# Patient Record
Sex: Male | Born: 1935 | Race: White | Hispanic: No | Smoking: Former smoker
Health system: Southern US, Community
[De-identification: ages and names within clinical notes are randomized; demographics above are authoritative.]

## PROBLEM LIST (undated history)

## (undated) DIAGNOSIS — I10 Essential (primary) hypertension: Secondary | ICD-10-CM

## (undated) DIAGNOSIS — I639 Cerebral infarction, unspecified: Secondary | ICD-10-CM

## (undated) DIAGNOSIS — E785 Hyperlipidemia, unspecified: Secondary | ICD-10-CM

## (undated) DIAGNOSIS — L409 Psoriasis, unspecified: Secondary | ICD-10-CM

## (undated) DIAGNOSIS — E039 Hypothyroidism, unspecified: Secondary | ICD-10-CM

## (undated) DIAGNOSIS — I8390 Asymptomatic varicose veins of unspecified lower extremity: Secondary | ICD-10-CM

## (undated) DIAGNOSIS — N529 Male erectile dysfunction, unspecified: Secondary | ICD-10-CM

## (undated) DIAGNOSIS — E559 Vitamin D deficiency, unspecified: Secondary | ICD-10-CM

## (undated) HISTORY — DX: Hyperlipidemia, unspecified: E78.5

## (undated) HISTORY — DX: Hypothyroidism, unspecified: E03.9

## (undated) HISTORY — DX: Asymptomatic varicose veins of unspecified lower extremity: I83.90

## (undated) HISTORY — PX: OTHER SURGICAL HISTORY: SHX169

## (undated) HISTORY — DX: Psoriasis, unspecified: L40.9

## (undated) HISTORY — DX: Vitamin D deficiency, unspecified: E55.9

## (undated) HISTORY — DX: Essential (primary) hypertension: I10

## (undated) HISTORY — DX: Male erectile dysfunction, unspecified: N52.9

## (undated) HISTORY — PX: JOINT REPLACEMENT: SHX530

---

## 1994-08-07 HISTORY — PX: OTHER SURGICAL HISTORY: SHX169

## 1999-12-22 ENCOUNTER — Encounter: Payer: Self-pay | Admitting: Specialist

## 1999-12-22 ENCOUNTER — Ambulatory Visit (HOSPITAL_COMMUNITY): Admission: RE | Admit: 1999-12-22 | Discharge: 1999-12-22 | Payer: Self-pay | Admitting: Specialist

## 2000-01-05 ENCOUNTER — Encounter: Payer: Self-pay | Admitting: Specialist

## 2000-01-05 ENCOUNTER — Ambulatory Visit (HOSPITAL_COMMUNITY): Admission: RE | Admit: 2000-01-05 | Discharge: 2000-01-05 | Payer: Self-pay | Admitting: Specialist

## 2001-03-14 ENCOUNTER — Encounter: Payer: Self-pay | Admitting: Emergency Medicine

## 2001-03-14 ENCOUNTER — Inpatient Hospital Stay (HOSPITAL_COMMUNITY): Admission: AD | Admit: 2001-03-14 | Discharge: 2001-03-15 | Payer: Self-pay | Admitting: Cardiology

## 2002-03-11 ENCOUNTER — Encounter: Admission: RE | Admit: 2002-03-11 | Discharge: 2002-03-11 | Payer: Self-pay | Admitting: *Deleted

## 2003-03-16 ENCOUNTER — Ambulatory Visit (HOSPITAL_COMMUNITY): Admission: RE | Admit: 2003-03-16 | Discharge: 2003-03-16 | Payer: Self-pay | Admitting: *Deleted

## 2003-03-16 ENCOUNTER — Encounter (INDEPENDENT_AMBULATORY_CARE_PROVIDER_SITE_OTHER): Payer: Self-pay

## 2004-06-14 ENCOUNTER — Ambulatory Visit: Payer: Self-pay | Admitting: Family Medicine

## 2004-07-21 ENCOUNTER — Ambulatory Visit: Payer: Self-pay | Admitting: Family Medicine

## 2004-07-27 ENCOUNTER — Ambulatory Visit: Payer: Self-pay | Admitting: Family Medicine

## 2004-09-07 ENCOUNTER — Emergency Department (HOSPITAL_COMMUNITY): Admission: EM | Admit: 2004-09-07 | Discharge: 2004-09-07 | Payer: Self-pay | Admitting: Emergency Medicine

## 2004-10-25 ENCOUNTER — Ambulatory Visit: Payer: Self-pay | Admitting: Family Medicine

## 2004-11-01 ENCOUNTER — Ambulatory Visit: Payer: Self-pay | Admitting: Family Medicine

## 2005-02-12 ENCOUNTER — Emergency Department (HOSPITAL_COMMUNITY): Admission: EM | Admit: 2005-02-12 | Discharge: 2005-02-12 | Payer: Self-pay | Admitting: Emergency Medicine

## 2005-03-16 ENCOUNTER — Ambulatory Visit: Payer: Self-pay | Admitting: Family Medicine

## 2005-03-21 ENCOUNTER — Ambulatory Visit: Payer: Self-pay | Admitting: Family Medicine

## 2005-06-23 ENCOUNTER — Ambulatory Visit: Payer: Self-pay | Admitting: Cardiology

## 2005-06-23 IMAGING — CR DG CHEST 1V PORT
1 series · 1 of 1 positions shown · non-contrast
Comparison: [DATE].

CLINICAL DATA: Arrhythmia.  Supraventricular tachycardia.  Chest pain.   Hypertension.
 PORTABLE CHEST - 1 VIEW:

[view not recorded]
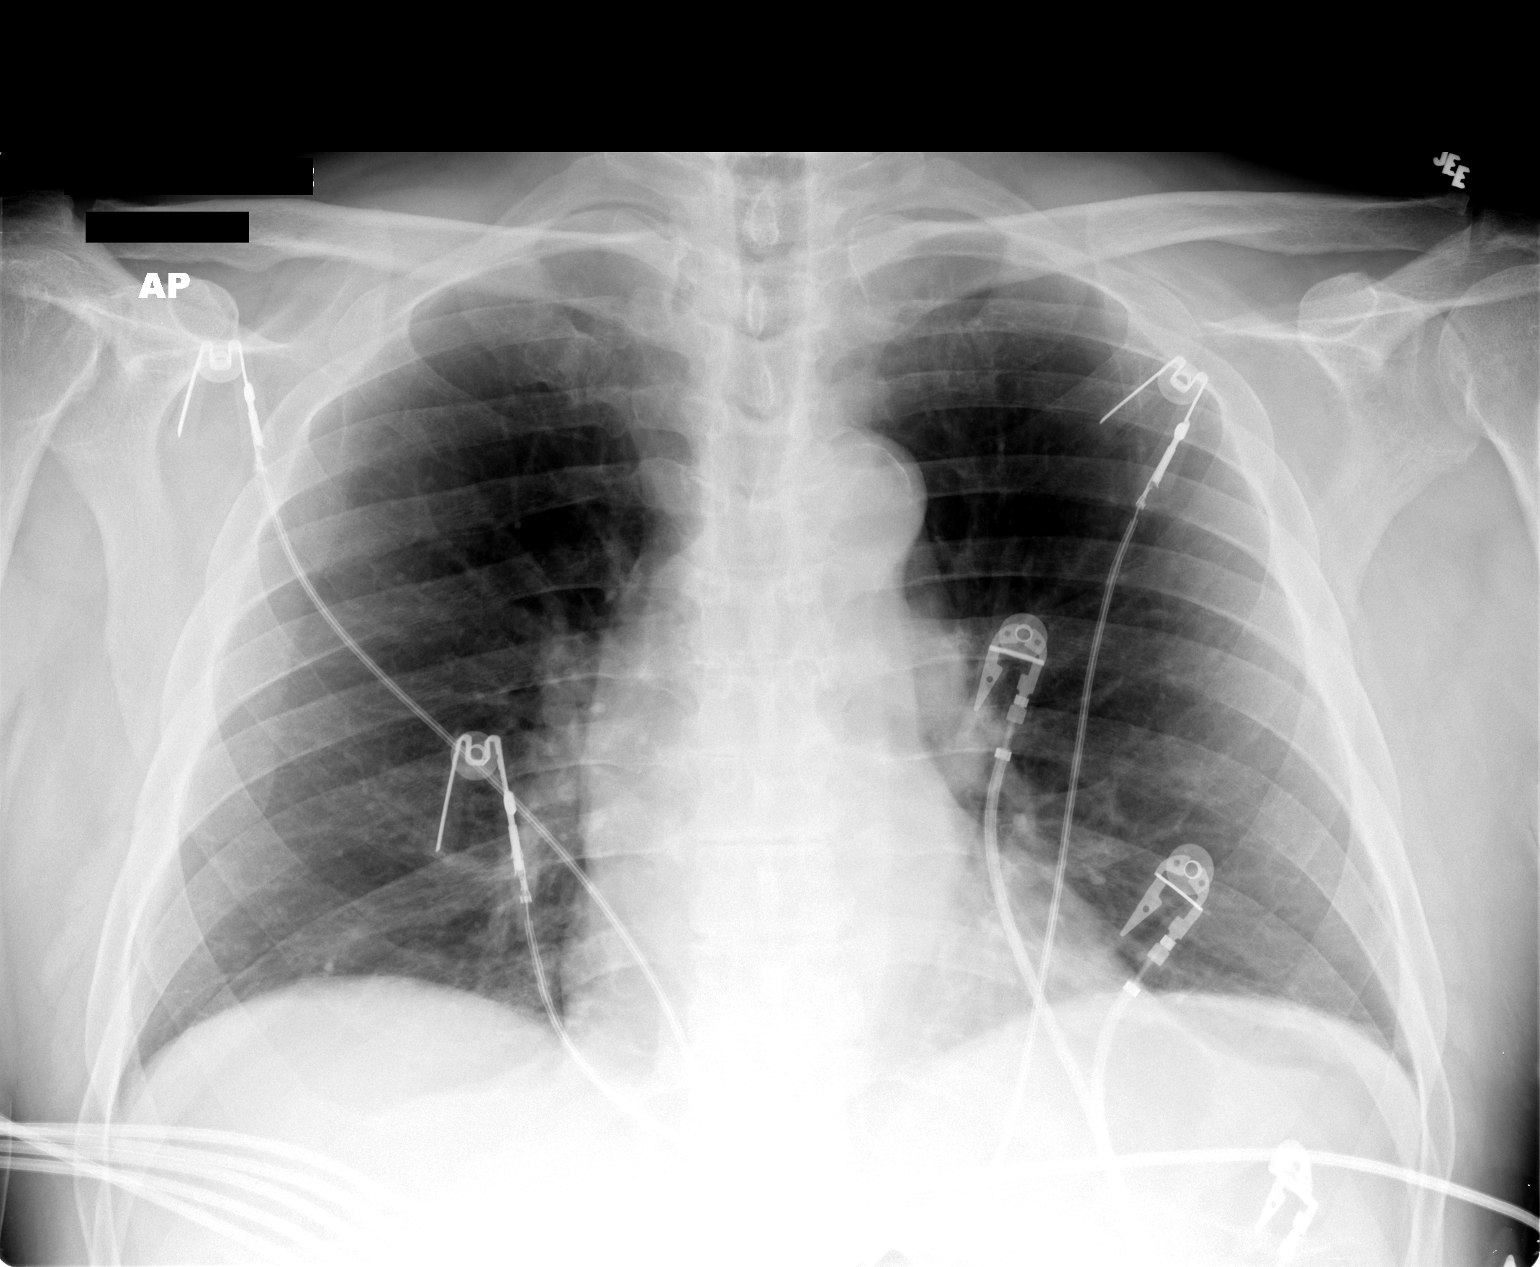

[1 of 1 positions shown; findings below may reference images not displayed]

FINDINGS: Mild streaky opacity is noted in the left lung base which was not seen on the previous study.  This may be due to mild atelectasis versus infiltrate.  The right lung remains clear.  There is no evidence of pleural effusion.  The heart size is normal.
IMPRESSION: Low lung volumes.  Mild atelectasis versus infiltrate at the left lung base.

## 2005-06-24 ENCOUNTER — Inpatient Hospital Stay (HOSPITAL_COMMUNITY): Admission: EM | Admit: 2005-06-24 | Discharge: 2005-06-24 | Payer: Self-pay | Admitting: Emergency Medicine

## 2005-07-11 ENCOUNTER — Ambulatory Visit: Payer: Self-pay | Admitting: Internal Medicine

## 2005-07-18 ENCOUNTER — Ambulatory Visit (HOSPITAL_COMMUNITY): Admission: AD | Admit: 2005-07-18 | Discharge: 2005-07-19 | Payer: Self-pay | Admitting: Internal Medicine

## 2005-08-11 ENCOUNTER — Ambulatory Visit: Payer: Self-pay | Admitting: Cardiology

## 2005-11-20 ENCOUNTER — Encounter: Admission: RE | Admit: 2005-11-20 | Discharge: 2005-11-20 | Payer: Self-pay | Admitting: Orthopedic Surgery

## 2005-12-11 ENCOUNTER — Ambulatory Visit: Payer: Self-pay | Admitting: Internal Medicine

## 2006-02-21 ENCOUNTER — Ambulatory Visit: Payer: Self-pay | Admitting: Family Medicine

## 2006-03-28 ENCOUNTER — Ambulatory Visit: Payer: Self-pay | Admitting: Family Medicine

## 2006-06-20 ENCOUNTER — Ambulatory Visit: Payer: Self-pay | Admitting: Family Medicine

## 2006-07-13 ENCOUNTER — Ambulatory Visit: Payer: Self-pay | Admitting: Family Medicine

## 2006-07-13 LAB — CONVERTED CEMR LAB
Chol/HDL Ratio, serum: 5.8
Cholesterol: 177 mg/dL (ref 0–200)
HDL: 30.6 mg/dL — ABNORMAL LOW (ref >39.0)
LDL DIRECT: 76.9 mg/dL
Triglyceride fasting, serum: 317 mg/dL (ref 0–149)
VLDL: 63 mg/dL — ABNORMAL HIGH (ref 0–40)

## 2007-03-12 DIAGNOSIS — E785 Hyperlipidemia, unspecified: Secondary | ICD-10-CM

## 2007-03-12 DIAGNOSIS — I10 Essential (primary) hypertension: Secondary | ICD-10-CM

## 2007-05-02 ENCOUNTER — Ambulatory Visit: Payer: Self-pay | Admitting: Family Medicine

## 2007-05-02 DIAGNOSIS — E039 Hypothyroidism, unspecified: Secondary | ICD-10-CM | POA: Insufficient documentation

## 2007-05-02 DIAGNOSIS — L408 Other psoriasis: Secondary | ICD-10-CM | POA: Insufficient documentation

## 2007-05-08 LAB — CONVERTED CEMR LAB
ALT: 18 units/L (ref 0–53)
AST: 21 units/L (ref 0–37)
Albumin: 3.9 g/dL (ref 3.5–5.2)
Alkaline Phosphatase: 64 units/L (ref 39–117)
BUN: 25 mg/dL — ABNORMAL HIGH (ref 6–23)
Basophils Absolute: 0 10*3/uL (ref 0.0–0.1)
Basophils Relative: 0.3 % (ref 0.0–1.0)
Bilirubin, Direct: 0.3 mg/dL (ref 0.0–0.3)
CO2: 30 meq/L (ref 19–32)
Calcium: 9.4 mg/dL (ref 8.4–10.5)
Chloride: 104 meq/L (ref 96–112)
Cholesterol: 161 mg/dL (ref 0–200)
Creatinine, Ser: 1.5 mg/dL (ref 0.4–1.5)
Direct LDL: 75.4 mg/dL
Eosinophils Absolute: 0.8 10*3/uL — ABNORMAL HIGH (ref 0.0–0.6)
Eosinophils Relative: 11 % — ABNORMAL HIGH (ref 0.0–5.0)
GFR calc Af Amer: 59 mL/min
GFR calc non Af Amer: 48 mL/min
Glucose, Bld: 88 mg/dL (ref 70–99)
HCT: 42.9 % (ref 39.0–52.0)
HDL: 27.8 mg/dL — ABNORMAL LOW (ref >39.0)
Hemoglobin: 15.1 g/dL (ref 13.0–17.0)
Lymphocytes Relative: 27.7 % (ref 12.0–46.0)
MCHC: 35.1 g/dL (ref 30.0–36.0)
MCV: 91.8 fL (ref 78.0–100.0)
Monocytes Absolute: 0.5 10*3/uL (ref 0.2–0.7)
Monocytes Relative: 6.7 % (ref 3.0–11.0)
Neutro Abs: 3.7 10*3/uL (ref 1.4–7.7)
Neutrophils Relative %: 54.3 % (ref 43.0–77.0)
PSA: 4.07 ng/mL — ABNORMAL HIGH (ref 0.10–4.00)
Platelets: 195 10*3/uL (ref 150–400)
Potassium: 4.2 meq/L (ref 3.5–5.1)
RBC: 4.67 M/uL (ref 4.22–5.81)
RDW: 12 % (ref 11.5–14.6)
Sodium: 139 meq/L (ref 135–145)
TSH: 1.18 microintl units/mL (ref 0.35–5.50)
Total Bilirubin: 1.8 mg/dL — ABNORMAL HIGH (ref 0.3–1.2)
Total CHOL/HDL Ratio: 5.8
Total Protein: 6.7 g/dL (ref 6.0–8.3)
Triglycerides: 234 mg/dL (ref 0–149)
VLDL: 47 mg/dL — ABNORMAL HIGH (ref 0–40)
WBC: 6.9 10*3/uL (ref 4.5–10.5)

## 2007-05-15 ENCOUNTER — Ambulatory Visit: Payer: Self-pay | Admitting: Family Medicine

## 2007-06-12 ENCOUNTER — Ambulatory Visit: Payer: Self-pay | Admitting: Family Medicine

## 2007-08-21 ENCOUNTER — Ambulatory Visit: Payer: Self-pay | Admitting: Family Medicine

## 2007-08-22 ENCOUNTER — Telehealth: Payer: Self-pay | Admitting: Family Medicine

## 2007-08-23 ENCOUNTER — Ambulatory Visit: Payer: Self-pay | Admitting: Family Medicine

## 2007-08-23 LAB — CONVERTED CEMR LAB: BUN: 27 mg/dL — ABNORMAL HIGH (ref 6–23)

## 2007-08-30 ENCOUNTER — Ambulatory Visit: Payer: Self-pay | Admitting: Internal Medicine

## 2007-08-30 IMAGING — CT CT HEAD W/O CM
1 series · 16 of 30 positions shown, 20 images · non-contrast
Comparison: none

CLINICAL DATA: Amnesia two weeks ago.
 HEAD CT WITHOUT CONTRAST ? [DATE]:
TECHNIQUE: Contiguous axial CT images were obtained from the base of the skull through the vertex according to standard protocol without contrast.

[Series 2: head_seq 4.5 h37s st · axial · 0.46mm/px · z∈[-132,-6]mm · 16 of 32 slices shown, 20 images]
[im 2/32  brain]
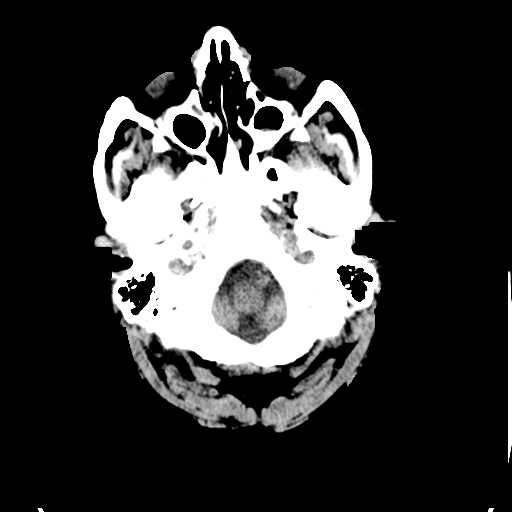
[im 2/32  bone]
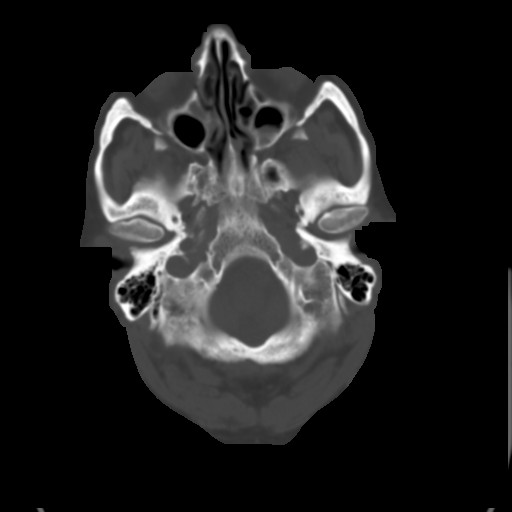
[im 4/32  brain]
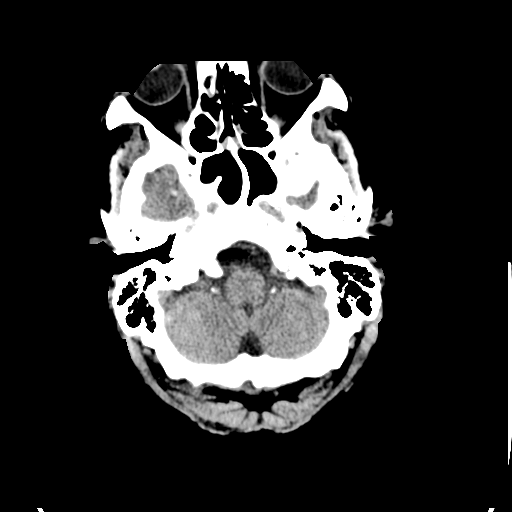
[im 6/32  brain]
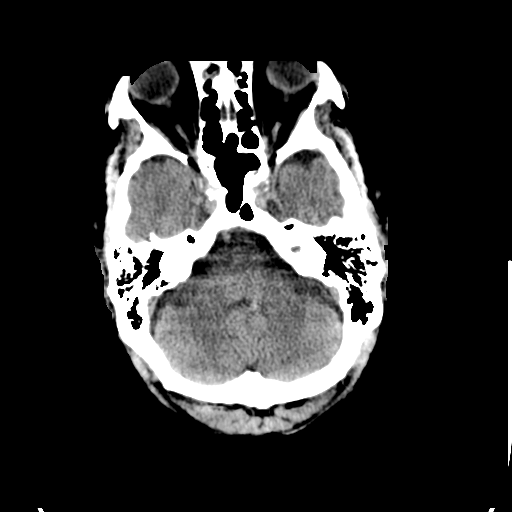
[im 8/32  brain]
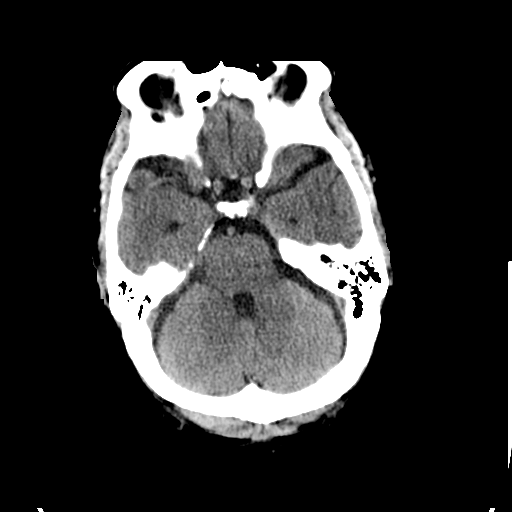
[im 9/32  brain]
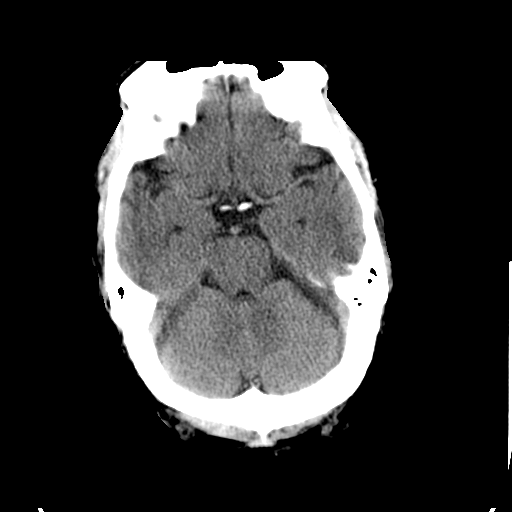
[im 9/32  bone]
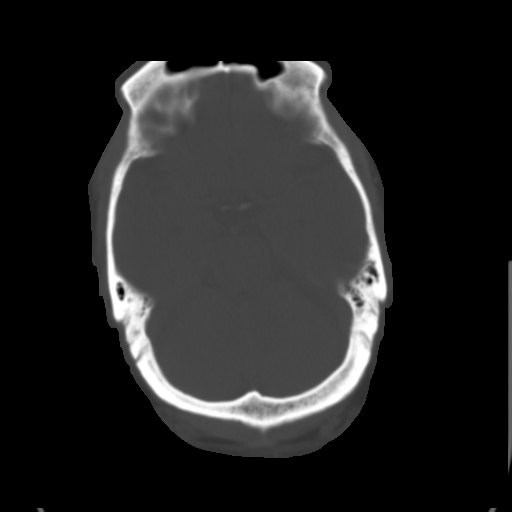
[im 11/32  brain]
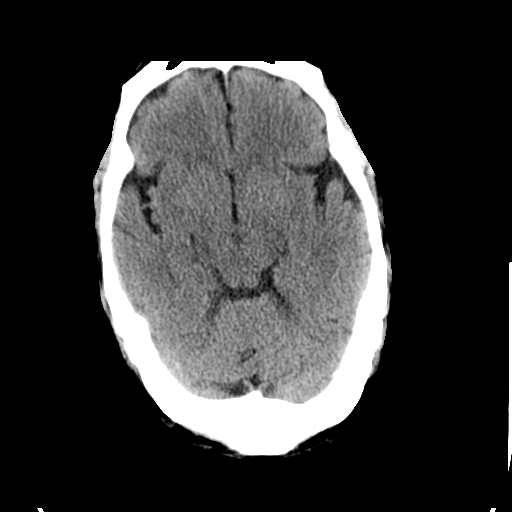
[im 13/32  brain]
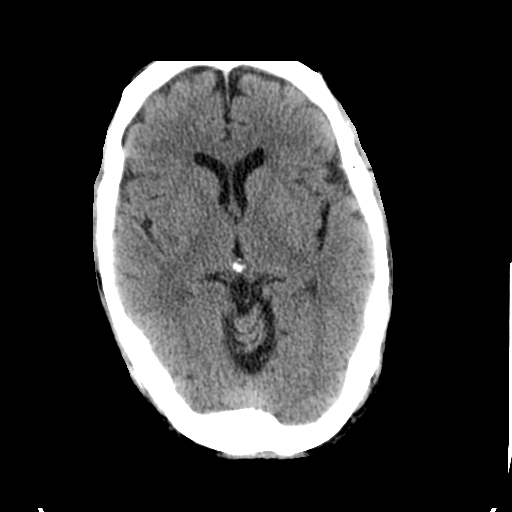
[im 15/32  brain]
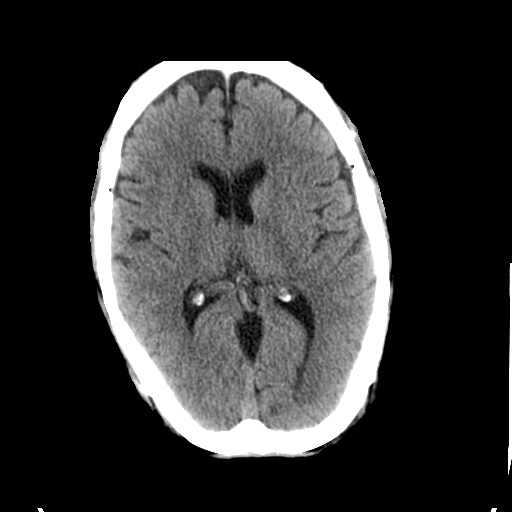
[im 17/32  brain]
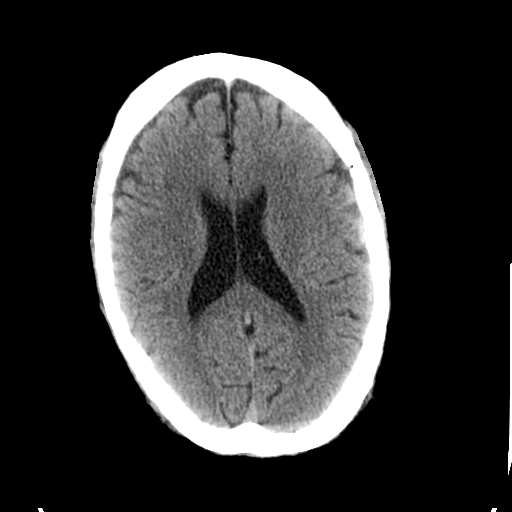
[im 17/32  bone]
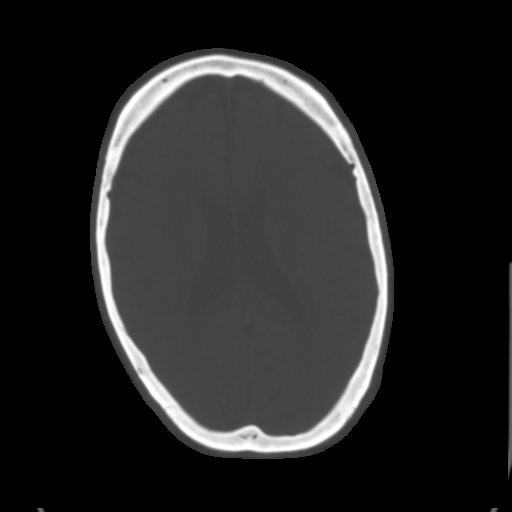
[im 19/32  brain]
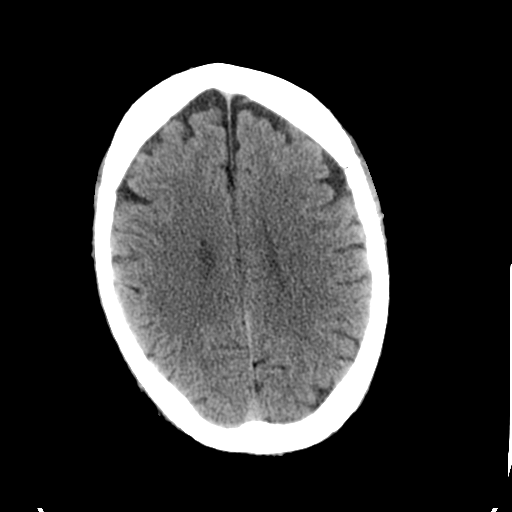
[im 21/32  brain]
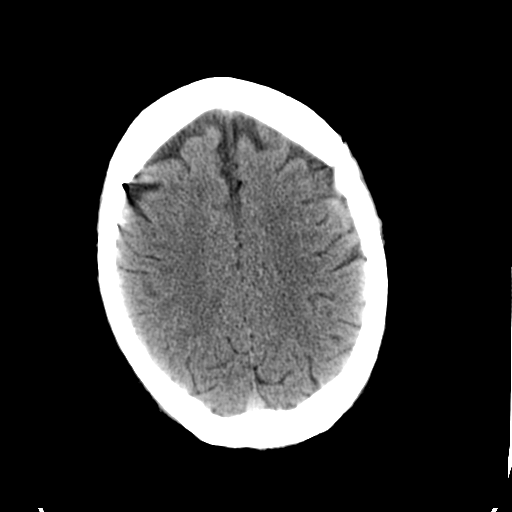
[im 23/32  brain]
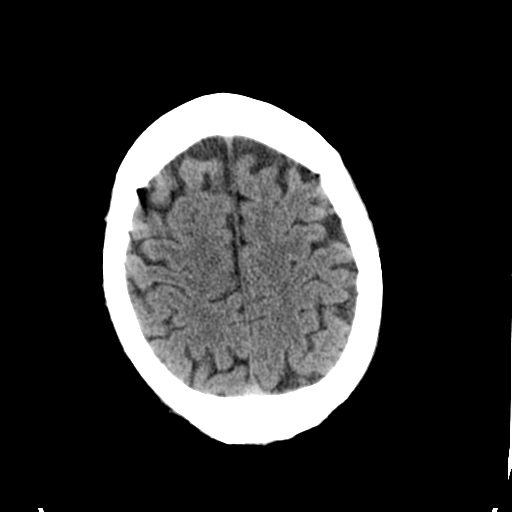
[im 24/32  brain]
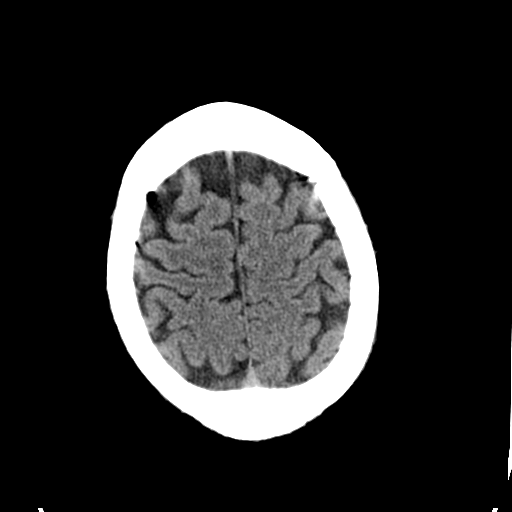
[im 24/32  bone]
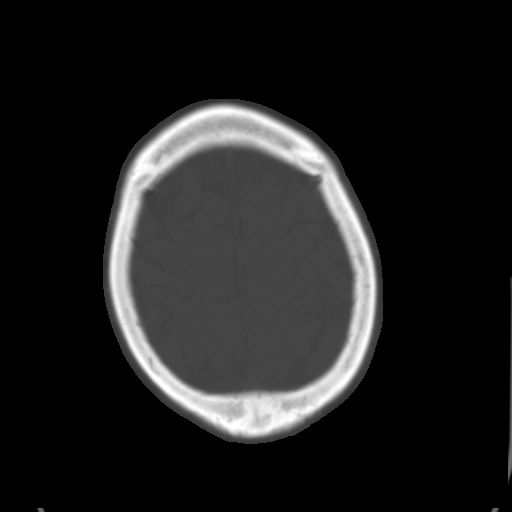
[im 26/32  brain]
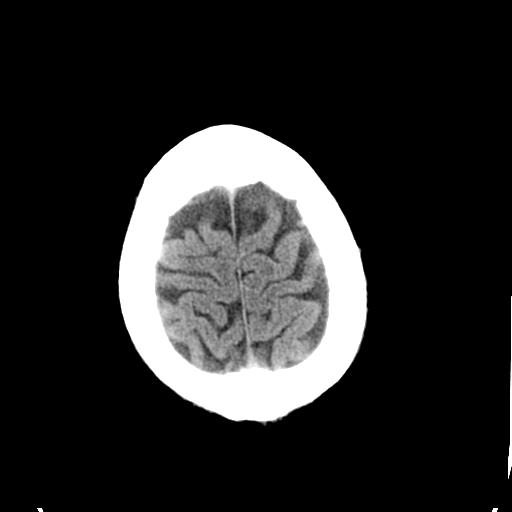
[im 28/32  brain]
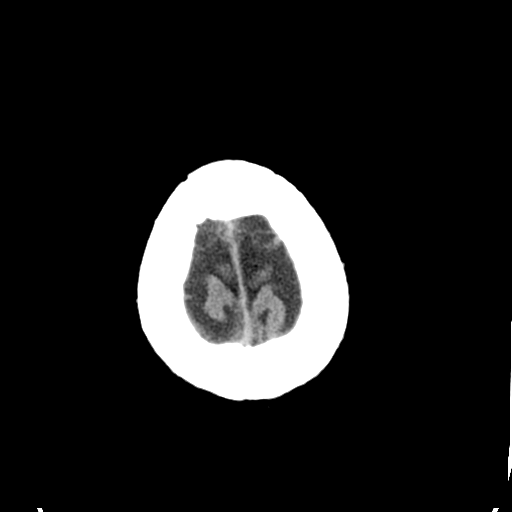
[im 30/32  brain]
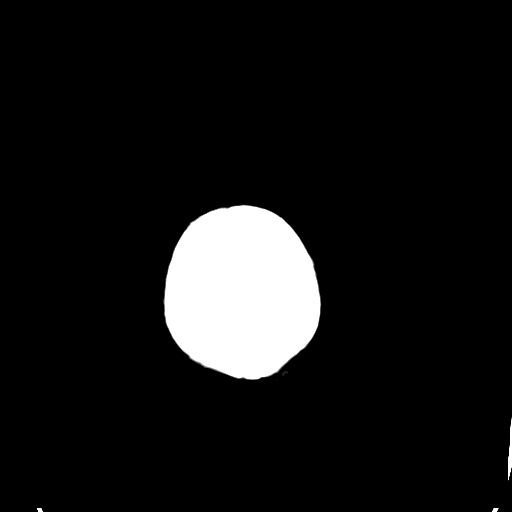

[16 of 30 positions shown; findings below may reference images not displayed]

FINDINGS: The ventricular system is normal in size and configuration for age and the septum is in a normal midline position.  The fourth ventricle and basilar cisterns appear normal.  No blood, edema, or mass effect is seen.  On bone window images, there is evidence of left maxillary and ethmoid sinus disease with some mucosal thickening of the sphenoid sinus as well.  No bony abnormality is seen.
IMPRESSION: 1.  No acute intracranial abnormality. 
 2.  Left maxillary and ethmoid sinus disease.

## 2008-02-03 ENCOUNTER — Encounter: Payer: Self-pay | Admitting: Family Medicine

## 2008-08-13 ENCOUNTER — Ambulatory Visit: Payer: Self-pay | Admitting: Family Medicine

## 2008-08-13 DIAGNOSIS — T50995A Adverse effect of other drugs, medicaments and biological substances, initial encounter: Secondary | ICD-10-CM | POA: Insufficient documentation

## 2008-08-13 DIAGNOSIS — E559 Vitamin D deficiency, unspecified: Secondary | ICD-10-CM

## 2008-08-13 DIAGNOSIS — M129 Arthropathy, unspecified: Secondary | ICD-10-CM | POA: Insufficient documentation

## 2008-08-13 DIAGNOSIS — I8390 Asymptomatic varicose veins of unspecified lower extremity: Secondary | ICD-10-CM

## 2008-08-20 LAB — CONVERTED CEMR LAB
ALT: 16 units/L (ref 0–53)
AST: 22 units/L (ref 0–37)
Albumin: 4.1 g/dL (ref 3.5–5.2)
BUN: 28 mg/dL — ABNORMAL HIGH (ref 6–23)
Basophils Relative: 0.4 % (ref 0.0–3.0)
Chloride: 100 meq/L (ref 96–112)
Creatinine, Ser: 1.4 mg/dL (ref 0.4–1.5)
Direct LDL: 118.7 mg/dL
Eosinophils Absolute: 0.6 10*3/uL (ref 0.0–0.7)
Eosinophils Relative: 7.8 % — ABNORMAL HIGH (ref 0.0–5.0)
GFR calc non Af Amer: 52 mL/min
HCT: 46.6 % (ref 39.0–52.0)
MCV: 93.1 fL (ref 78.0–100.0)
Monocytes Absolute: 0.6 10*3/uL (ref 0.1–1.0)
Neutrophils Relative %: 58.7 % (ref 43.0–77.0)
RBC: 5 M/uL (ref 4.22–5.81)
Total Protein: 7.9 g/dL (ref 6.0–8.3)
Vit D, 1,25-Dihydroxy: 25 — ABNORMAL LOW (ref 30–89)
WBC: 8 10*3/uL (ref 4.5–10.5)

## 2008-12-16 ENCOUNTER — Telehealth: Payer: Self-pay

## 2009-03-09 ENCOUNTER — Ambulatory Visit: Payer: Self-pay | Admitting: Family Medicine

## 2009-05-02 ENCOUNTER — Encounter: Payer: Self-pay | Admitting: Family Medicine

## 2009-05-04 ENCOUNTER — Encounter: Payer: Self-pay | Admitting: Family Medicine

## 2009-08-25 ENCOUNTER — Telehealth: Payer: Self-pay | Admitting: Family Medicine

## 2009-11-09 ENCOUNTER — Ambulatory Visit: Payer: Self-pay | Admitting: Family Medicine

## 2009-11-09 DIAGNOSIS — F528 Other sexual dysfunction not due to a substance or known physiological condition: Secondary | ICD-10-CM

## 2009-11-09 DIAGNOSIS — M169 Osteoarthritis of hip, unspecified: Secondary | ICD-10-CM

## 2009-11-19 ENCOUNTER — Ambulatory Visit: Payer: Self-pay | Admitting: Family Medicine

## 2009-11-20 LAB — CONVERTED CEMR LAB
OCCULT 1: NEGATIVE
OCCULT 3: NEGATIVE

## 2009-11-23 ENCOUNTER — Encounter: Payer: Self-pay | Admitting: Family Medicine

## 2009-12-27 ENCOUNTER — Ambulatory Visit: Payer: Self-pay | Admitting: Family Medicine

## 2009-12-29 LAB — CONVERTED CEMR LAB
BUN: 25 mg/dL — ABNORMAL HIGH (ref 6–23)
Creatinine, Ser: 1.3 mg/dL (ref 0.4–1.5)

## 2009-12-30 ENCOUNTER — Ambulatory Visit: Payer: Self-pay | Admitting: Family Medicine

## 2009-12-30 DIAGNOSIS — J029 Acute pharyngitis, unspecified: Secondary | ICD-10-CM

## 2009-12-30 DIAGNOSIS — J209 Acute bronchitis, unspecified: Secondary | ICD-10-CM

## 2010-01-05 ENCOUNTER — Telehealth: Payer: Self-pay | Admitting: Family Medicine

## 2010-01-17 ENCOUNTER — Encounter: Payer: Self-pay | Admitting: Family Medicine

## 2010-01-17 HISTORY — PX: TOTAL HIP ARTHROPLASTY: SHX124

## 2010-04-15 ENCOUNTER — Telehealth (INDEPENDENT_AMBULATORY_CARE_PROVIDER_SITE_OTHER): Payer: Self-pay | Admitting: *Deleted

## 2010-07-30 ENCOUNTER — Encounter: Payer: Self-pay | Admitting: Family Medicine

## 2010-08-26 ENCOUNTER — Encounter: Payer: Self-pay | Admitting: Family Medicine

## 2010-09-04 LAB — CONVERTED CEMR LAB
ALT: 20 units/L (ref 0–53)
AST: 20 units/L (ref 0–37)
Albumin: 4 g/dL (ref 3.5–5.2)
Alkaline Phosphatase: 71 units/L (ref 39–117)
Blood in Urine, dipstick: NEGATIVE
Calcium: 9.6 mg/dL (ref 8.4–10.5)
Eosinophils Relative: 8.3 % — ABNORMAL HIGH (ref 0.0–5.0)
GFR calc non Af Amer: 44.54 mL/min (ref >60)
HCT: 45.1 % (ref 39.0–52.0)
HDL: 34.1 mg/dL — ABNORMAL LOW (ref >39.00)
Hemoglobin: 16 g/dL (ref 13.0–17.0)
Lymphocytes Relative: 32.8 % (ref 12.0–46.0)
Lymphs Abs: 2.6 10*3/uL (ref 0.7–4.0)
Monocytes Relative: 7.9 % (ref 3.0–12.0)
Neutro Abs: 4 10*3/uL (ref 1.4–7.7)
Nitrite: NEGATIVE
Potassium: 3.6 meq/L (ref 3.5–5.1)
Sodium: 138 meq/L (ref 135–145)
TSH: 1.85 microintl units/mL (ref 0.35–5.50)
Total Bilirubin: 1.2 mg/dL (ref 0.3–1.2)
Total CHOL/HDL Ratio: 5
Urobilinogen, UA: 0.2
VLDL: 115.2 mg/dL — ABNORMAL HIGH (ref 0.0–40.0)
Vit D, 25-Hydroxy: 16 ng/mL — ABNORMAL LOW (ref 30–89)
WBC Urine, dipstick: NEGATIVE
WBC: 7.8 10*3/uL (ref 4.5–10.5)

## 2010-09-06 NOTE — Assessment & Plan Note (Signed)
Summary: cough/gh   Vital Signs:  Patient profile:   77 year old male Weight:      188 pounds O2 Sat:      89 % Temp:     98.6 degrees F Pulse rate:   92 / minute BP sitting:   120 / 80  (left arm)  Vitals Entered By: Pura Spice, RN (Dec 30, 2009 4:47 PM) CC: took hydrocodone homatropine  coughing up brownish green sputum vomited last hs after taking cough med   History of Present Illness: This 77 year old of white male has had coughing congestion over the past 8 days increasing in severity he also had sore throat. 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 Shelle Iron, 1996 torn right biceps muscle 1996  Risk Factors: Smoking Status: quit (03/12/2007)  Review of Systems      See HPI  Physical Exam  General:  Well-developed,well-nourished,in no acute distress; alert,appropriate and cooperative throughout examination Head:  Normocephalic and atraumatic without obvious abnormalities. No apparent alopecia or balding. Eyes:  No corneal or conjunctival inflammation noted. EOMI. Perrla. Funduscopic exam benign, without hemorrhages, exudates or papilledema. Vision grossly normal. Ears:  External ear exam shows no significant lesions or deformities.  Otoscopic examination reveals clear canals, tympanic membranes are intact  bilaterally without bulging, retraction, inflammation or discharge. Hearing is grossly normal bilaterally. Nose:  External nasal examination shows no deformity or inflammation. Nasal mucosa are pink and moist without lesions or exudates. Mouth:  pharyngeal erythema.   Neck:  No deformities, masses, or tenderness noted. Lungs:  rhonchi bilaterally with a few rales at the left base and over the middle lobe the right lung Heart:  Normal rate and regular rhythm. S1 and S2 normal without gallop, murmur, click, rub or other extra sounds. Abdomen:  Bowel sounds positive,abdomen soft and non-tender without masses, organomegaly or hernias noted. Msk:  marked tenderness over right hip   Impression & Recommendations:  Problem # 1:  ACUTE PHARYNGITIS (ICD-462) Assessment New  His updated medication list for this problem includes:    Aspirin 325 Mg Tbec (Aspirin) ..... Once daily    Arthrotec 75 75-200 Mg-mcg Tabs (Diclofenac-misoprostol) .Marland Kitchen... Take1 tab two times a day after meals    Aspirin Adult Low Strength 81 Mg Tbec (Aspirin) .Marland Kitchen... 1 qd    Zithromax Z-pak 250 Mg Tabs (Azithromycin) .Marland Kitchen... 2 stent then one q.d.  Problem # 2:  ACUTE BRONCHITIS (ICD-466.0) Assessment: New  His updated medication list for this problem includes:    Zithromax Z-pak 250 Mg Tabs (Azithromycin) .Marland Kitchen... 2 stent then one q.d.  Orders: Prescription Created Electronically [phone])  Problem # 3:  DEGENERATIVE JOINT DISEASE, RIGHT HIP (ICD-715.95)  His updated medication list for this problem includes:  Aspirin 325 Mg Tbec (Aspirin) ..... Once daily    Arthrotec 75 75-200 Mg-mcg Tabs (Diclofenac-misoprostol) .Marland Kitchen... Take1 tab two times a day after meals    Aspirin Adult Low Strength 81 Mg Tbec (Aspirin) .Marland Kitchen... 1 qd    Tramadol Hcl 50 Mg Tabs (Tramadol hcl) .Marland Kitchen... 1-2 q.i.d. for cough to have hip replacement in 2 weeks  Complete Medication List: 1)  Crestor 10 Mg Tabs (Rosuvastatin calcium) .... Once daily 2)   Hydrochlorothiazide 25 Mg Tabs (Hydrochlorothiazide) .... Take 1 tablet by mouth once a day 3)  Metoprolol Tartrate 25 Mg Tabs (Metoprolol tartrate) .... Take 1 tablet by mouth two times a day 4)  Aspirin 325 Mg Tbec (Aspirin) .... Once daily 5)  Viagra 100 Mg Tabs (Sildenafil citrate) .... Take 1 one hour before needed 6)  Clobetasol Propionate 0.05 % Soln (Clobetasol propionate) .... Apply twice a week to affected area 7)  Arthrotec 75 75-200 Mg-mcg Tabs (Diclofenac-misoprostol) .... Take1 tab two times a day after meals 8)  Temovate 0.05 % Oint (Clobetasol propionate) .... Apply to affected area two times a day 9)  Soma 250 Mg Tabs (Carisoprodol) .Marland Kitchen.. 1 three times a day ac 2 hs for muscle spasm 10)  Aspirin Adult Low Strength 81 Mg Tbec (Aspirin) .Marland Kitchen.. 1 qd 11)  Vitamin D 10175 Unit Caps (Ergocalciferol) .Marland Kitchen.. 1 by mouth every week for 12 weeks 12)  Lyrica 75 Mg Caps (Pregabalin) .Marland Kitchen.. 1 a m and pm for numbness and tingling 13)  Zithromax Z-pak 250 Mg Tabs (Azithromycin) .... 2 stent then one q.d. 14)  Tramadol Hcl 50 Mg Tabs (Tramadol hcl) .Marland Kitchen.. 1-2 q.i.d. for cough  Patient Instructions: 1)  continue the antibiotic 2)  Have added tramadol for call 3)  Callif no better Prescriptions: ZITHROMAX Z-PAK 250 MG TABS (AZITHROMYCIN) 2 stent then one q.d.  #one pkge x 0   Entered and Authorized by:   Judithann Sheen MD   Signed by:   Judithann Sheen MD on 01/07/2010   Method used:   Telephoned to ...       [facility] [address] (retail)       [address]       [place], [place]  [address]       Ph: [phone] or [phone]       Fax: [phone]   RxID:   [id number]

## 2010-09-06 NOTE — Op Note (Signed)
Summary: Right Total Hip Replacement-Dr. Dellie Burns in [place]  Right Total Hip Replacement-Dr. Dellie Burns in [place]   Imported By: Maryln Gottron 02/23/2010 13:53:41  _____________________________________________________________________  External Attachment:    Type:   Image     Comment:   External Document

## 2010-09-06 NOTE — Assessment & Plan Note (Signed)
Summary: emp/pt fasting/pt bringing in meds/cjr   Vital Signs:  Patient profile:   77 year old male Height:      67 inches Weight:      186 pounds BMI:     29.24 O2 Sat:      94 % Temp:     98.2 degrees F Pulse rate:   70 / minute BP sitting:   120 / 80  (left arm)  Vitals Entered By: Pura Spice, RN (November 09, 2009 8:50 AM) CC: discuss problems having hip surg in [place] june 13v 2011 also dropped turbo on left shoulder seeing dr supple . refill meds  Is Patient Diabetic? 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 as well as renewed his medications complains of pain in the right heel and is planning to have a hip replacement done by an orthopedic surgeon in [place] on January 17, 2010 Arthritis of the joints are controlled with Arthrotec,Does complain of some numbness and tingling in the lower right leg recently Continues to need Viagra for erectile function Needs his medication for hypertension and hyperlipidemia refill psoriasis under good control with clobetasol  Allergies (verified): No Known Drug Allergies  Past History:  Past Medical History: Last updated: 08/13/2008 Hyperlipidemia Hypertension    ekg   sees dr Ladona Ridgel eye exam   2009  dr Sharlene Motts  summerfiled  colonscopy    dr orr   2004  due now DT    2009 PNEUNOVAX      per pt 2005 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 Shelle Iron, 1996 torn right biceps muscle 1996  Risk Factors: Smoking Status: quit (03/12/2007)  Review of Systems      See HPI  The patient denies anorexia, fever, weight loss, weight gain, vision loss, decreased hearing, hoarseness, chest pain, syncope, dyspnea on exertion, peripheral edema, prolonged cough, headaches, hemoptysis, abdominal pain, melena, hematochezia, severe indigestion/heartburn, hematuria, incontinence, genital sores, muscle  weakness, suspicious skin lesions, transient blindness, difficulty walking, depression, unusual weight change, abnormal bleeding, enlarged lymph nodes, angioedema, breast masses, and testicular masses.    Physical Exam  General:  Well-developed,well-nourished,in no acute distress; alert,appropriate and cooperative throughout examination Head:  Normocephalic and atraumatic without obvious abnormalities. No apparent alopecia or balding. Eyes:  No corneal or conjunctival inflammation noted. EOMI. Perrla. Funduscopic exam benign, without hemorrhages, exudates or papilledema. Vision grossly normal. Ears:  External ear exam shows no significant lesions or deformities.  Otoscopic examination reveals clear canals, tympanic membranes are intact bilaterally without bulging, retraction, inflammation or discharge. Hearing is grossly normal bilaterally. Nose:  External nasal examination shows no deformity or inflammation. Nasal mucosa are pink and moist without lesions or exudates. Mouth:  Oral mucosa and oropharynx without lesions or exudates.  Teeth in good repair. Neck:  No deformities, masses, or tenderness noted. Chest Wall:  No deformities, masses, tenderness or gynecomastia noted. Breasts:  No masses or gynecomastia noted Lungs:  Normal respiratory effort, chest expands symmetrically. Lungs are clear to auscultation, no crackles or wheezes. Heart:  Normal rate and regular rhythm. S1 and S2 normal without gallop, murmur, click, rub or other extra sounds. Abdomen:  Bowel sounds positive,abdomen soft and non-tender without masses, organomegaly or hernias noted. Rectal:  No external abnormalities noted. Normal sphincter tone. No rectal masses or tenderness. Genitalia:  Testes bilaterally descended without nodularity, tenderness or masses.  No scrotal masses or lesions. No penis lesions or urethral discharge. Prostate:  no nodules, no asymmetry, and 1+ enlarged.   Msk:  tenderness over the right hip as well  as pain on movement examination of the back negative no limitation straight leg raise and Pulses:  R and L carotid,radial,femoral,dorsalis pedis and posterior tibial pulses are full and equal bilaterally Extremities:  No clubbing, cyanosis, edema, or deformity noted with normal full range of motion of all joints.   Neurologic:  No cranial nerve deficits noted. Station and gait are normal. Plantar reflexes are down-going bilaterally. DTRs are symmetrical throughout. Sensory, motor and coordinative functions appear intact. Skin:  Intact without suspicious lesions or rashes Cervical Nodes:  No lymphadenopathy noted Axillary Nodes:  No palpable lymphadenopathy Inguinal Nodes:  No significant adenopathy Psych:  Cognition and judgment appear intact. Alert and cooperative with normal attention span and concentration. No apparent delusions, illusions, hallucinations   Impression & Recommendations:  Problem # 1:  DEGENERATIVE JOINT DISEASE, RIGHT HIP (ICD-715.95) Assessment Deteriorated  His updated medication list for this problem includes:    Aspirin 325 Mg Tbec (Aspirin) ..... Once daily    Arthrotec 75 75-200 Mg-mcg Tabs (Diclofenac-misoprostol) .Marland Kitchen... Take1 tab two times a day after meals    Aspirin Adult Low Strength 81 Mg Tbec (Aspirin) .Marland Kitchen... 1 qd  Problem # 2:  ARTHRITIS (ICD-716.90) Assessment: Unchanged  Problem # 3:  PSORIASIS (ICD-696.1) Assessment: Improved  Problem # 4:  HYPERTENSION (ICD-401.9) Assessment: Improved  His updated medication list for this problem includes:    Hydrochlorothiazide 25 Mg Tabs (Hydrochlorothiazide) .Marland Kitchen... Take 1 tablet by mouth once a day    Metoprolol Tartrate 25 Mg Tabs (Metoprolol tartrate) .Marland Kitchen... Take 1 tablet by mouth two times a day  Orders: EKG w/ Interpretation (93000)  Problem # 5:  HYPERLIPIDEMIA (ICD-272.4) Assessment: Improved  His updated medication list for this problem includes:    Crestor 10 Mg Tabs (Rosuvastatin calcium) .....  Once daily  Orders: Venipuncture ([address]643) TLB-Lipid Panel (80061-LIPID) TLB-Hepatic/Liver Function Pnl (80076-HEPATIC)  Problem # 6:  ERECTILE DYSFUNCTION, NON-ORGANIC (ICD-302.72) Assessment: Unchanged  His updated medication list for this problem includes:    Viagra 100 Mg Tabs (Sildenafil citrate) .Marland Kitchen... Take 1 one hour before needed  Orders: Prescription Created Electronically [phone])  Complete Medication List: 1)  Crestor 10 Mg Tabs (Rosuvastatin calcium) .... Once daily 2)  Hydrochlorothiazide 25 Mg Tabs (Hydrochlorothiazide) .... Take 1 tablet by mouth once a day 3)  Metoprolol Tartrate 25 Mg Tabs (Metoprolol tartrate) .... Take 1 tablet by mouth two times a day 4)  Aspirin 325 Mg Tbec (Aspirin) .... Once daily 5)  Viagra 100 Mg Tabs (Sildenafil citrate) .... Take 1 one hour before needed 6)  Clobetasol Propionate 0.05 % Soln (Clobetasol propionate) .... Apply twice a week to affected area 7)  Arthrotec 75 75-200 Mg-mcg Tabs (Diclofenac-misoprostol) .... Take1 tab two times a day after meals 8)  Temovate 0.05 % Oint (Clobetasol propionate) .... Apply to affected area two times a day 9)  Soma 250 Mg Tabs (Carisoprodol) .Marland Kitchen.. 1 three times a day ac 2 hs for muscle spasm 10)  Aspirin Adult Low Strength 81 Mg Tbec (Aspirin) .Marland Kitchen.. 1 qd 11)  Vitamin D 88416 Unit Caps (Ergocalciferol) .Marland Kitchen.. 1 by mouth every week for 12 weeks 12)  Lyrica 75 Mg Caps (Pregabalin) .Marland Kitchen.. 1 a m and pm for numbness and tingling  Other Orders: T-Vitamin D (25-Hydroxy) ([address]30-16010) UA Dipstick w/o Micro (automated)  (81003) TLB-BMP (Basic Metabolic  Panel-BMET) (80048-METABOL) TLB-CBC Platelet - w/Differential (85025-CBCD) TLB-TSH (Thyroid Stimulating Hormone) (84443-TSH) TLB-PSA (Prostate Specific Antigen) (84153-PSA) Prescriptions: VITAMIN D 54098 UNIT CAPS (ERGOCALCIFEROL) 1 by mouth every week for 12 weeks  #12 x 1   Entered and Authorized by:   Judithann Sheen MD   Signed by:   Judithann Sheen  MD on 11/17/2009   Method used:   Electronically to        [facility] [phone][facility] (retail)       [address]       [place], [place]  [address]       Ph: [phone] or [phone]       Fax: [phone]   RxID:   [id number] LYRICA 75 MG CAPS (PREGABALIN) 1 A M and PM for numbness and tingling  #60 x 11   Entered and Authorized by:   Judithann Sheen MD   Signed by:   Judithann Sheen MD on 11/09/2009   Method used:   Print then Give to Patient   RxID:   [id number] SOMA 250 MG TABS (CARISOPRODOL) 1 three times a day ac 2 hs for muscle spasm  #450 x 3   Entered and Authorized by:   Judithann Sheen MD   Signed by:   Judithann Sheen MD on 11/09/2009   Method used:   Electronically to        [facility] [phone]* (retail)       [address]       [place], [place]  [address]       Ph: [phone] or [phone]       Fax: [phone]   RxID:   [id number] TEMOVATE 0.05 %  OINT (CLOBETASOL PROPIONATE) apply to affected area two times a day  #135 x 3   Entered and Authorized by:   Judithann Sheen MD   Signed by:   Judithann Sheen MD on 11/09/2009   Method used:   Electronically to        [facility] [phone]* (retail)       [address]       [place], [place]  [address]       Ph: [phone] or [phone]       Fax: [phone]   RxID:   [id number] ARTHROTEC 75 75-200 MG-MCG  TABS (DICLOFENAC-MISOPROSTOL) take1 tab two times a day after meals  #180 x 3   Entered and Authorized by:   Judithann Sheen MD   Signed by:   Judithann Sheen MD on 11/09/2009   Method used:   Electronically to        [facility] [phone]* (retail)       [address]       [place], [place]  [address]       Ph: [phone] or [phone]       Fax: [phone]   RxID:   [id number] CLOBETASOL PROPIONATE 0.05 %  SOLN (CLOBETASOL PROPIONATE) apply twice a week to affected area  #120 x 11   Entered and Authorized by:   Judithann Sheen MD   Signed by:    Judithann Sheen MD on 11/09/2009   Method used:   Electronically to        [facility] [phone]* (retail)       [address]       [place], [place]  [address]       Ph: [phone] or [phone]       Fax: [phone]   RxID:   [id number] VIAGRA 100 MG  TABS (SILDENAFIL CITRATE) take 1 one hour before needed  #18 x 3   Entered and Authorized by:   Judithann Sheen MD   Signed by:   Judithann Sheen MD on 11/09/2009   Method used:   Electronically to        [facility] [phone]* (retail)       [address]       [place], [place]  [address]       Ph: [phone] or [phone]       Fax: [phone]   RxID:   [id number] METOPROLOL TARTRATE 25 MG  TABS (METOPROLOL TARTRATE) Take 1 tablet by mouth two times a day  #180 x 3   Entered and Authorized by:   Judithann Sheen MD   Signed by:   Judithann Sheen MD on 11/09/2009   Method used:   Electronically to        [facility] [phone]* (retail)       [address]       [place], [place]  [address]       Ph: [phone] or [phone]       Fax: [phone]   RxID:   [id number] HYDROCHLOROTHIAZIDE 25 MG  TABS (HYDROCHLOROTHIAZIDE) Take 1 tablet by mouth once a day  #90 x 3   Entered and Authorized by:   Judithann Sheen MD   Signed by:   Judithann Sheen MD on 11/09/2009   Method used:   Electronically to        [facility] [phone]* (retail)       [address]       [place], [place]  [address]       Ph: [phone] or [phone]       Fax: [phone]   RxID:   [id number] CRESTOR 10 MG TABS (ROSUVASTATIN CALCIUM) once daily  #90 x 3   Entered and Authorized by:   Judithann Sheen MD   Signed by:   Judithann Sheen MD on 11/09/2009   Method used:   Electronically to        [facility] [phone]* (retail)       [address]       [place], [place]  [address]       Ph: [phone] or [phone]       Fax: [phone]   RxID:   [id number]      Laboratory Results   Urine  Tests  Date/Time Recieved: November 09, 2009 1:16 PM  Date/Time Reported: November 09, 2009 1:15 PM   Routine Urinalysis   Color: yellow Appearance: Clear Glucose: negative   (Normal Range: Negative) Bilirubin: negative   (Normal Range: Negative) Ketone: negative   (Normal Range: Negative) Spec. Gravity: >=1.030   (Normal Range: 1.003-1.035) Blood: negative   (Normal Range: Negative) pH: 5.5   (Normal Range: 5.0-8.0) Protein: 1+   (Normal Range: Negative) Urobilinogen: 0.2   (Normal Range: 0-1) Nitrite: negative   (Normal Range: Negative) Leukocyte Esterace: negative   (Normal Range: Negative)    Comments: Wynona Canes, CMA  November 09, 2009 1:16 PM

## 2010-09-06 NOTE — Progress Notes (Signed)
Summary: appt sch  ---- Converted from flag ---- ---- 08/25/2009 8:59 AM, Lucy Antigua wrote: I called pt and sch cpx with fasting labs. Pt is bringing in meds also. Pt said he was going to be out of town Feb and March, so I sch him for 11/09/09 at 8:30am fasting.   ---- 08/24/2009 3:15 PM, Pura Spice, RN wrote: pls sch Brendan Long cpx and fasting and bring meds ------------------------------  Phone Note Call from Patient

## 2010-09-06 NOTE — Progress Notes (Signed)
Summary: rtc  Phone Note Call from Patient Call back at Work Phone [phone]   Caller: Patient Call For: Judithann Sheen MD Summary of Call: pt is returning dr Scotty Court call Initial call taken by: Heron Sabins,  January 05, 2010 9:48 AM  Follow-up for Phone Call        Will call patient Follow-up by: Judithann Sheen MD,  January 07, 2010 5:04 PM     Appended Document: rtc call patient but did not have an answer and had to leave a message

## 2010-09-06 NOTE — Progress Notes (Signed)
Summary: Flu vaccination     Vitals Entered By: Josph Macho RMA (April 15, 2010 4:16 PM)    Preventive Care Screening  Last Flu Shot:    Date:  04/15/2010    Results:  historical

## 2010-09-06 NOTE — Letter (Signed)
Summary: Generic Letter  [facility]  [address] [place], [place] [address]   Phone: [phone]  Fax: [phone]    11/23/2009  Brendan Long [address] [place], [place]  [address]  Dear Mr. Mcdaniel,    Hemocult cards were negative.        Sincerely,  Dr Gwenyth Bender [place],MD

## 2010-09-08 NOTE — Letter (Signed)
Summary: [facility]  [facility]   Imported By: Maryln Gottron 08/15/2010 15:41:36  _____________________________________________________________________  External Attachment:    Type:   Image     Comment:   External Document  Appended Document: [facility] Cough reviewed

## 2010-09-14 NOTE — Letter (Signed)
Summary: Orthopaedics-Dr. Dellie Burns  Orthopaedics-Dr. Dellie Burns   Imported By: Maryln Gottron 09/05/2010 13:55:59  _____________________________________________________________________  External Attachment:    Type:   Image     Comment:   External Document

## 2010-11-14 ENCOUNTER — Encounter: Payer: Self-pay | Admitting: Family Medicine

## 2010-11-15 ENCOUNTER — Ambulatory Visit (INDEPENDENT_AMBULATORY_CARE_PROVIDER_SITE_OTHER): Payer: Medicare Other | Admitting: Family Medicine

## 2010-11-15 ENCOUNTER — Encounter: Payer: Self-pay | Admitting: Family Medicine

## 2010-11-15 VITALS — BP 110/80 | HR 72 | Temp 98.5°F | Ht 70.5 in | Wt 180.0 lb

## 2010-11-15 DIAGNOSIS — Z96649 Presence of unspecified artificial hip joint: Secondary | ICD-10-CM

## 2010-11-15 DIAGNOSIS — R350 Frequency of micturition: Secondary | ICD-10-CM

## 2010-11-15 DIAGNOSIS — I10 Essential (primary) hypertension: Secondary | ICD-10-CM

## 2010-11-15 DIAGNOSIS — E039 Hypothyroidism, unspecified: Secondary | ICD-10-CM

## 2010-11-15 DIAGNOSIS — E559 Vitamin D deficiency, unspecified: Secondary | ICD-10-CM

## 2010-11-15 DIAGNOSIS — Z96643 Presence of artificial hip joint, bilateral: Secondary | ICD-10-CM

## 2010-11-15 DIAGNOSIS — N401 Enlarged prostate with lower urinary tract symptoms: Secondary | ICD-10-CM

## 2010-11-15 DIAGNOSIS — Z136 Encounter for screening for cardiovascular disorders: Secondary | ICD-10-CM

## 2010-11-15 DIAGNOSIS — N529 Male erectile dysfunction, unspecified: Secondary | ICD-10-CM

## 2010-11-15 DIAGNOSIS — E785 Hyperlipidemia, unspecified: Secondary | ICD-10-CM

## 2010-11-15 DIAGNOSIS — N138 Other obstructive and reflux uropathy: Secondary | ICD-10-CM

## 2010-11-15 DIAGNOSIS — D649 Anemia, unspecified: Secondary | ICD-10-CM

## 2010-11-15 LAB — BASIC METABOLIC PANEL
BUN: 28 mg/dL — ABNORMAL HIGH (ref 6–23)
Calcium: 9.7 mg/dL (ref 8.4–10.5)
GFR: 39.54 mL/min — ABNORMAL LOW (ref >60.00)
Glucose, Bld: 93 mg/dL (ref 70–99)

## 2010-11-15 LAB — POCT URINALYSIS DIPSTICK
Bilirubin, UA: NEGATIVE
Blood, UA: NEGATIVE
Ketones, UA: NEGATIVE
Nitrite, UA: NEGATIVE
Spec Grav, UA: 1.02
pH, UA: 5.5

## 2010-11-15 LAB — LIPID PANEL
Cholesterol: 196 mg/dL (ref 0–200)
Triglycerides: 538 mg/dL — ABNORMAL HIGH (ref 0.0–149.0)

## 2010-11-15 LAB — HEPATIC FUNCTION PANEL
Albumin: 4.1 g/dL (ref 3.5–5.2)
Total Protein: 7.4 g/dL (ref 6.0–8.3)

## 2010-11-15 LAB — CBC WITH DIFFERENTIAL/PLATELET
Eosinophils Relative: 9.3 % — ABNORMAL HIGH (ref 0.0–5.0)
HCT: 48.6 % (ref 39.0–52.0)
Hemoglobin: 16.5 g/dL (ref 13.0–17.0)
Lymphs Abs: 2.8 10*3/uL (ref 0.7–4.0)
MCV: 94.5 fl (ref 78.0–100.0)
Monocytes Absolute: 0.6 10*3/uL (ref 0.1–1.0)
Neutro Abs: 5 10*3/uL (ref 1.4–7.7)
Platelets: 188 10*3/uL (ref 150.0–400.0)
WBC: 9.2 10*3/uL (ref 4.5–10.5)

## 2010-11-15 LAB — PSA: PSA: 4.23 ng/mL — ABNORMAL HIGH (ref 0.10–4.00)

## 2010-11-15 MED ORDER — ROSUVASTATIN CALCIUM 10 MG PO TABS: 10.0000 mg | ORAL_TABLET | Freq: Every day | ORAL | Status: DC

## 2010-11-15 MED ORDER — METOPROLOL TARTRATE 25 MG PO TABS: 25.0000 mg | ORAL_TABLET | Freq: Two times a day (BID) | ORAL | Status: DC

## 2010-11-15 MED ORDER — SILDENAFIL CITRATE 100 MG PO TABS: 100.0000 mg | ORAL_TABLET | ORAL | Status: DC | PRN

## 2010-11-15 MED ORDER — HYDROCHLOROTHIAZIDE 25 MG PO TABS: 25.0000 mg | ORAL_TABLET | Freq: Every day | ORAL | Status: DC

## 2010-11-15 NOTE — Patient Instructions (Addendum)
yOU ARE DOING GREAT Continue regular medications as instructed Will call la results

## 2010-11-18 ENCOUNTER — Telehealth: Payer: Self-pay

## 2010-11-18 MED ORDER — VITAMIN D3 1.25 MG (50000 UT) PO CAPS: 50000.0000 [IU] | ORAL_CAPSULE | ORAL | Status: DC

## 2010-11-18 NOTE — Progress Notes (Signed)
Pt is aware of lab results.

## 2010-11-18 NOTE — Telephone Encounter (Signed)
Pt will schedule an appt for bmet in 6 weeks

## 2010-11-18 NOTE — Telephone Encounter (Signed)
Message copied by Earle Gell on Fri Nov 18, 2010  3:16 PM ------      Message from: Harvie Heck.      Created: Fri Nov 18, 2010 12:26 PM       Lab studies  Good except vit d level low, give rx for Vit D3  50000 units weekly for 12 weeks      Renal studies abnormal, recommend no NSAIDS as aleve, ibuprofen, diclofenac for 6 weeks, stay well hydrated repeat BMET in 6 weeks

## 2010-11-21 ENCOUNTER — Encounter: Payer: Self-pay | Admitting: Family Medicine

## 2010-11-21 NOTE — Progress Notes (Signed)
  Subjective:    Patient ID: DUARD SPIEWAK, male    DOB: [birth year], 77 y.o.   MRN: [id number] This 77 year old white widower he is in to discuss his multiple medical problems refill his medications and get no surgery laboratory studies he had an ablation for atrial fibrillation 2006 and is doing great. 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 doing well as long as he takes his Arthrotec he has had no cardiac symptoms nor problems Erectile dysfunction controlled with medication Viagra HPI    Review of Systemsreferral history of present illness     Objective:   Physical Exam patient is well-developed well-nourished man who does not appears his given age   appears very healthy and cooperative HEENT not remarkable carotid pulses good thyroid nonpalpable Lungs clear to palpation percussion and auscultation Heart no cardiomegaly heart sounds good without murmurs peripheral pulses are equal bilaterally Rhythm regular no indication of atrial fib Abdominal exam revealed liver spleen kidneys nonpalpable no masses felt aorta are normal.pemale .peobs  Genitalia negative Rectal exam reveals 1+ prostate enlargement no nodules nontender Well healed operative scars over both fields No edema Neurological negative Skin negative     Assessment & Plan:  General patient is doing for her well his medical problems are under control  Hypertension well controlled no change in treatment Arthritis under good control with diclofenac Arteriosclerotic heart disease good control Plan  on laboratory studies  Return p.r.n.

## 2010-11-24 ENCOUNTER — Other Ambulatory Visit (INDEPENDENT_AMBULATORY_CARE_PROVIDER_SITE_OTHER): Payer: Medicare Other | Admitting: Family Medicine

## 2010-11-24 DIAGNOSIS — R195 Other fecal abnormalities: Secondary | ICD-10-CM

## 2010-11-24 LAB — HEMOCCULT GUIAC POC 1CARD (OFFICE)
Card #2 Fecal Occult Blod, POC: NEGATIVE
Card #3 Fecal Occult Blood, POC: NEGATIVE

## 2010-11-28 ENCOUNTER — Telehealth: Payer: Self-pay

## 2010-11-28 NOTE — Telephone Encounter (Signed)
Pt is aware.  

## 2010-11-28 NOTE — Telephone Encounter (Signed)
Message copied by Earle Gell on Mon Nov 28, 2010 11:42 AM ------      Message from: Harvie Heck.      Created: Thu Nov 24, 2010  2:44 PM       Negative hemocults

## 2010-12-23 NOTE — H&P (Signed)
NAME:  Brendan Long, Brendan Long NO.:  [id number]   MEDICAL RECORD NO.:  [id number]          PATIENT TYPE:  INP   LOCATION:  1824                         FACILITY:  [facility]   PHYSICIAN:  Learta Codding, M.D. LHCDATE OF BIRTH:  1928-09-03   DATE OF ADMISSION:  06/23/2005  DATE OF DISCHARGE:                                HISTORY & PHYSICAL   PRIMARY CARE PHYSICIAN:  Tawny Asal, M.D.   CARDIOLOGIST:  Pricilla Riffle, M.D.   REASON FOR ADMISSION:  A 77 year old male status post rapid narrow-QRS  tachycardia with minimally elevated troponin levels.   HISTORY OF PRESENT ILLNESS:  The patient is a 77 year old male with a  history of supraventricular tachycardia  diagnosed in 2002.  The patient at  that time saw Dr. Tenny Craw in consultation and I presume was placed on beta  blocker therapy.  The patient this evening around 8 o'clock developed sudden  onset of palpitations associated with chest pressure but no presyncope.  He  had a prolonged episode approximately 1 hour before EMS arrived and was  given IV adenosine and converted back into normal sinus rhythm.  No rhythm  strips are available, per EMS report, the patient was in a narrow QRS  tachycardia with a rate of 180 beats per minute.  During the administration  of adenosine, the patient experienced severe substernal chest pain for  several seconds.  He also had chest pain throughout the episode of  tachycardia.   On arrival to the emergency room, the patient reports no symptoms.  He has  no shortness of breath or chest pain.  EKG shows normal sinus rhythm, old  RBBB with no acute ischemic changes.  First set of troponin markers were  negative.  Second set demonstrates level of 0.9.   Of note is that this patient [place]  fishing with his son.  He suddenly experienced similar symptoms and went to  [facility] at that time.  When he arrived there, he appeared  to be in  normal rhythm.  He underwent echocardiography and stress testing  which reportedly were within normal limits, although I do not have those  results available.  Of note also, the patient had a Cardiolite and  echocardiogram done in 2002 which were within normal limits.  He has no  known history of coronary artery disease.   ALLERGIES:  No known drug allergies.   MEDICATIONS:  1.  Hydrochlorothiazide 15 mg a day.  2.  Lopressor 25 mg p.o. twice daily.  3.  Aspirin 325 mg p.o. daily.  4.  Niacin 5 mg p.o. nightly.   PAST MEDICAL HISTORY:  As above.   SOCIAL HISTORY:  The patient lives with his wife in [place].  He does not  smoke.  He does not drink alcohol.   FAMILY HISTORY:  Noncontributory.  There is no family history of coronary  artery disease.   REVIEW OF SYSTEMS:  The patient denies any nausea, vomiting, fever, or  chills. No syncope. No orthopnea, PND.  No abdominal pain.  No claudication.  The remainder of Review of Systems is within normal limits.   PHYSICAL EXAMINATION:  VITAL SIGNS:  Blood pressure 130/70, heart rate 76  beats per minute, afebrile.  GENERAL:  Well-nourished black male in no apparent distress.  HEENT:  Pupils and eyes clear.  NECK:  Supple.  No carotid upstroke.  No carotid bruits.  LUNGS:  Clear breath sounds bilaterally.  HEART:  Regular rate and rhythm.  Normal S1 and S2.  No pathological  murmurs.  ABDOMEN:  Soft, nontender, with no rebound or guarding.  Good bowel sounds.  EXTREMITIES:  No cyanosis, clubbing, or edema.   Chest x-ray is pending.   EKG: Normal sinus rhythm with right bundle branch block which is old.   Laboratory work: Hemoglobin 14.3, hematocrit 43.  Sodium 137, potassium 3.7,  chloride 106, glucose 144.  Initial troponin less than 0.05. CK-MB 2.1.  Second set: CK-MB 2.5, troponin 0.09.  Myoglobin within normal limits.   PROBLEM LIST:  1.  Narrow -QRS tachycardia.      1.  Short RP tachycardia.      2.  Status post  intravenous adenosine per EMS, now normal sinus rhythm.      3.  History of supraventricular tachycardia  in 2002 and 4 weeks ago at          [facility].  2.  No history of coronary artery disease.      1.  Negative Cardiolite study approximately 4 weeks ago at [facility].      2.  Negative Cardiolite study in 2002 and negative 2-D echocardiogram at          [facility].      3.  Second troponin set elevated at 0.09, likely nonspecific.  3.  Hypertension.  4.  Chronic right bundle branch block.   PLAN:  1.  The patient will be kept overnight to monitor cardiac enzymes.  2.  I suspect the troponin elevation is nonspecific.  The patient has no      known coronary artery disease and underwent stress testing 4 weeks ago      which was reported to be within normal limits, although results need to      be obtained.  3.  If cardiac enzyme elevation remains borderline, the patient can go home      in the morning with increased dosing of metoprolol to 50 mg p.o. twice      daily.  4.  Although the patient's episodes of what appear to be atrioventricular      nodal reentry tachycardia are not very frequent, he could certainly      benefit from radiofrequency ablation as he has had breakthrough with      beta blocker      therapy.  Given his age, the preferred treatment would be to avoid      excessive tachycardia for prolonged periods of time.  I have discussed      with great length possibility of proceeding with a RFA. the patient and      his wife will be scheduled to see Dr. Ladona Ridgel.      Learta Codding, M.D. [facility]  Electronically Signed     GED/MEDQ  D:  06/24/2005  T:  06/24/2005  Job:  [id number]   cc:   Ellin Saba., M.D.  [address] [place]  [place] [address]  Pricilla Riffle, M.D.  [address]  [address]  [place]  [place] [address]

## 2010-12-23 NOTE — Op Note (Signed)
   NAME:  Brendan Long, Brendan Long                        ACCOUNT NO.:  [id number]   MEDICAL RECORD NO.:  [id number]                   PATIENT TYPE:  AMB   LOCATION:  ENDO                                 FACILITY:  [facility]   PHYSICIAN:  Georgiana Spinner, M.D.                 DATE OF BIRTH:  1929/07/28   DATE OF PROCEDURE:  DATE OF DISCHARGE:                                 OPERATIVE REPORT   PROCEDURE:  Colonoscopy with polypectomy.   INDICATION:  Rectal bleeding.   ANESTHESIA:  Demerol 25 mg, Versed 2 mg.   DESCRIPTION OF PROCEDURE:  With the patient mildly sedated in the left  lateral decubitus position, a rectal examination performed and external  hemorrhoids were noted.  From this point, the endoscope was then inserted  and passed under direct vision to the cecum identified by the ileocecal  valve and the base of the cecum, photographs were taken.  From this point,  the colonoscope was slowly withdrawn, taking circumferential views of the  entire colonic mucosa and stopping 20 cm from the anal verge at which point  a small polyp was seen, photographed and removed using snare cautery  technique.  It was fulgurated actually with the electrocautery so that there  was no tissue for pathology.  The endoscope was then pulled back all the way  to the rectum which appeared normal on direct and retroflex view.  The  endoscope was straightened and withdrawn.  The patient's vital signs and  pulse oximetry remained stable and the patient tolerated the procedure well  without apparent complications.   FINDINGS:  Polyp 20 cm from the anal verge, fulgurated.   PLAN:  Repeat examination in two years or as needed.                                               Georgiana Spinner, M.D.    GMO/MEDQ  D:  03/16/2003  T:  03/16/2003  Job:  [id number]   cc:   Dr. Verl Dicker [facility]

## 2010-12-23 NOTE — Op Note (Signed)
   NAME:  Brendan Long, Brendan Long                        ACCOUNT NO.:  [id number]   MEDICAL RECORD NO.:  [id number]                   PATIENT TYPE:  AMB   LOCATION:  ENDO                                 FACILITY:  [facility]   PHYSICIAN:  Georgiana Spinner, M.D.                 DATE OF BIRTH:  [birth year]   DATE OF PROCEDURE:  DATE OF DISCHARGE:                                 OPERATIVE REPORT   PROCEDURE:  Upper endoscopy.   INDICATIONS:  Gastroesophageal reflux disease and hemoccult positivity.   ANESTHESIA:  Demerol 50 mg, Versed 4 mg.   DESCRIPTION OF PROCEDURE:  With the patient mildly sedated in the left  lateral decubitus position, the Olympus videoscopic endoscope was inserted  in the mouth and passed under direct vision through the esophagus, which  appeared normal until it reached the distal esophagus and there was some  mild irregularity at the Z-line which was photographed and biopsied.  We  entered into the stomach.  The fundus and body appeared normal.  In the  antrum there was an erosion or thickening that was seen and photographed and  biopsied.  Duodenal bulb was entered and along with the second portion of  the duodenum were photographed and appeared normal.  From this point, the  endoscope was slowly withdrawn taking circumferential views of the duodenal  mucosa until the endoscope then pulled back into the stomach, placed in  retroflexion and viewed the stomach from below.  The endoscope was then  straightened, withdrawn, taking circumferential views of the remaining  gastric and esophageal mucosa.  The patient's vital signs and pulse oximetry  remained stable.  The patient tolerated the procedure well without apparent  complication.   FINDINGS:  Biopsies of distal esophagus and antrum as noted above.   PLAN:  Await biopsy report.  The patient will call me for results and follow  up with me as an outpatient.  Proceed with colonoscopy as planned.                         Georgiana Spinner, M.D.    GMO/MEDQ  D:  03/16/2003  T:  03/16/2003  Job:  [id number]

## 2010-12-23 NOTE — Discharge Summary (Signed)
NAME:  Brendan Long, Brendan Long NO.:  [id number]   MEDICAL RECORD NO.:  [id number]          PATIENT TYPE:  INP   LOCATION:  3742                         FACILITY:  [facility]   PHYSICIAN:  Learta Codding, M.D. LHCDATE OF BIRTH:  1929/07/25   DATE OF ADMISSION:  06/23/2005  DATE OF DISCHARGE:  06/24/2005                                 DISCHARGE SUMMARY   PRIMARY CARDIOLOGIST:  Miguel Aschoff, M.D.   PRINCIPAL DIAGNOSES:  1.  Narrow QRS tachycardia.      1.  Status post successful conversion with intravenous adenosine to          normal sinus rhythm.      2.  History of supraventricular tachycardia in 2002.   SECONDARY DIAGNOSES:  1.  Chronic right bundle branch block.  2.  Hypertension.  3.  Hyperlipidemia.   REASON FOR ADMISSION:  [name] is a 77 year old male, with history of  supraventricular tachycardia in 2002, who developed recurrent  tachypalpitations requiring treatment by EMS in the field with IV adenosine,  with conversion to normal sinus rhythm.  Please refer to Dr. Michelle Piper DeGent's  admission history and physical for full details.   HOSPITAL COURSE:  The patient was kept for overnight observation and  metoprolol was up-titrated to 50 mg b.i.d.   No recurrent dysrhythmia was documented.  The patient was cleared for  discharge with arrangements to follow up with Dr. Lewayne Bunting for  consideration of radiofrequency ablation.   LABORATORY DATA:  Normal CBC.  Potassium 3.7 on admission, BUN 26,  creatinine 1.2, glucose 134 on admission.  Normal CPK, troponin I 0.08.  BNP  less than 30.   MEDICATIONS AT DISCHARGE:  1.  Niacin 1000 mg daily.  2.  Crestor 20 mg daily.  3.  Aspirin 325 mg daily.  4.  Metoprolol 50 mg t.i.d. (new).  5.  Hydrochlorothiazide 12.5 mg daily.   FOLLOW-UP:  Arrangements will be made for the patient to follow up with Dr.  Lewayne Bunting in the office.   DISCHARGE DURATION TIME:  Less than 30 minutes.      Gene Serpe, P.A.  [facility]      Learta Codding, M.D. [facility]  Electronically Signed    GS/MEDQ  D:  08/15/2005  T:  08/16/2005  Job:  [id number]   cc:   Ellin Saba., M.D.  [address] [place]  [place] [address]

## 2010-12-23 NOTE — Discharge Summary (Signed)
NAME:  Brendan Long, Brendan Long              ACCOUNT NO.:  [id number]   MEDICAL RECORD NO.:  [id number]          PATIENT TYPE:  OIB   LOCATION:  3742                         FACILITY:  [facility]   PHYSICIAN:  Doylene Canning. Ladona Ridgel, M.D.  DATE OF BIRTH:  [birth year]   DATE OF ADMISSION:  07/18/2005  DATE OF DISCHARGE:  07/19/2005                                 DISCHARGE SUMMARY   DISCHARGING PHYSICIAN:  Dr. Lewayne Bunting, who is his electrophysiologist,  [facility].   PRIMARY CARDIOLOGIST:  Dr. Tenny Craw, [facility].   PRIMARY CARE GIVER:  Dr. Dellie Burns.   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   DISCHARGING DATA:  Status post electrophysiology study/radiofrequency  catheter ablation of AV nodal reentry tachycardia.   SECONDARY DIAGNOSES:  1.  Hypertension.  2.  Beta-blocker therapy.  3.  Dyslipidemia.   ALLERGIES:  THIS PATIENT HAS NO KNOWN DRUG ALLERGIES.   PROCEDURE:  July 18, 2005 electrophysiology study, radiofrequency  catheter ablation of AV nodal reentry tachycardia.  The patient has had no  recurrent dysrhythmias in the 16-hour post procedure monitoring period here  at [facility].  He otherwise, has no complaints to me.  His  metoprolol, which was 50 mg t.i.d., has been reduced to 25 mg b.i.d.   BRIEF HISTORY:  Brendan Long is a 77 year old.  He is a patient of Dr. Dietrich Pates.  He was admitted to the hospital several weeks ago with his second  episode of supraventricular tachycardia.  He was treated with Adenosine,  which ultimately converted him to sinus rhythm.  He has a documented narrow  QRS tachycardia of 180 beats per minute.  This dysrhythmia was associated  with chest pressure and minimal dyspnea.  He also feels quite anxious when  he has these tachy-palpitation episodes.  He has never had syncope.  Otherwise, the patient has hypertension.  He has been  on beta-blockers for  many years.  He has a recent Cardiolite stress test and echocardiogram,  which were within normal limits.  Treatment options have been discussed with  the patient.  The risks, benefits, and goals have been discussed.  The  patient wishes to proceed.   HOSPITAL COURSE:  The patient presented electively on July 18, 2005.  He  underwent electrophysiology study with a find of AV nodal reentry  tachycardia.  This was then successfully eliminated by radiofrequency  catheter ablation with slow P-wave modification by Dr. Lewayne Bunting.  The  patient has had no complications in the post procedural period.  The right  groin catheterization site is without hematoma or erythema, or pain.  The  patient reports that he slept well.  He is achieving 97% oxygen saturation  on room air.  His blood pressure is 126/89.  The patient has had no  laboratory studies this admission.  Follow-up is with Dr. Ladona Ridgel at [facility], [address] August 30, 2005 at 9:45.   DISCHARGE MEDICATIONS:  1.  Metoprolol 50 mg 1/2 tablet in the morning and 1/2  tablet in the      evening.  This is the new dose.  2.  Crestor 10 mg daily.  3.  Hydrochlorothiazide 12.5 mg daily.  4.  Enteric-coated aspirin 325 mg daily.   He is on a low-sodium, low-cholesterol diet.  He is asked not to drive for  the next two days.  He is asked not lift anything heavier than 10 pounds  over the next two weeks.  He may shower.  He is to call [phone] if he  experiences pain or swelling at the cath site.      Maple Mirza, P.A.    ______________________________  Doylene Canning. Ladona Ridgel, M.D.    GM/MEDQ  D:  07/19/2005  T:  07/20/2005  Job:  [id number]   cc:   Ellin Saba., M.D.  [address] [place]  [place] [address]   Pricilla Riffle, M.D.  [address]  [place]  [place] [address]   Doylene Canning. Ladona Ridgel, M.D.  [address]  [place]  [place] [address]

## 2010-12-23 NOTE — Op Note (Signed)
NAME:  MASTER, TOUCHET              ACCOUNT NO.:  [id number]   MEDICAL RECORD NO.:  [id number]          PATIENT TYPE:  OIB   LOCATION:  3742                         FACILITY:  [facility]   PHYSICIAN:  Doylene Canning. Ladona Ridgel, M.D.  DATE OF BIRTH:  1929-08-02   DATE OF PROCEDURE:  07/18/2005  DATE OF DISCHARGE:  07/19/2005                                 OPERATIVE REPORT   PROCEDURE PERFORMED:  Electrophysiologic study and radiofrequency catheter  ablation of AV node reentrant tachycardia.   INTRODUCTION:  The patient is a 77 year old male with a history of recurrent  SVT and documented heart rates of over 180 beats per minute.  His  tachycardia was reproducibly broken with adenosine.  He is been on medical  therapy with beta blockers but continues to have SVT and for this reason is  referred for electrophysiologic study and catheter ablation.   PROCEDURE:  After informed consent was obtained, patient was taken to the  diagnostic EP lab in the fasting state.  After the usual preparation and  draping, intravenous fentanyl and midazolam were given for sedation.  A 6-  [place] hexapolar catheter was inserted percutaneously into the right jugular  vein and advanced to the coronary sinus.  A 5-French quadripolar catheter  was inserted percutaneously in the right femoral vein and advanced to the  His bundle region.  A 5-French quadripolar catheter was inserted  percutaneously in the right femoral vein and advanced to the RV apex.  After  measurement of the basic intervals, rapid ventricular pacing was carried out  from the RV apex at pacing cycle length of 600 msec and stepwise decreased  down to 340 msec, where VA Wenckebach was observed.  It should be noted that  during rapid ventricular pacing at 380 msec, there was inducible SVT.  This  was a short RP tachycardia with midline atrial activation.  The tachycardia  had a cycle length of 360 msec and terminated spontaneously.  Next  programmed  ventricular stimulation was carried out from the RV apex at a  base drive cycle length of 130 msec.  The S1-S2 interval was stepwise  decreased down to 300 msec, where ventricular refractoriness was observed.  During programmed ventricular stimulation, the atrial activation was midline  and decremental.  Next programmed atrial stimulation was carried out from  the coronary sinus at basic drive cycle lengths of 865, 500, and 400 msec.  The S1-S2 interval was stepwise decreased down to 330 msec, where the AV  node ERP was observed.  During programmed atrial stimulation, there were  multiple AH jumps and echo beats and double echo beats noted.  Next rapid  atrial pacing was carried out from the coronary sinus as well as the high  right atrium at pace cycle lengths of 600 msec and stepwise decreased down  to 460 msec, where AV Wenckebach was observed.  During rapid atrial pacing,  the PR interval was not greater than the RR interval.  There was no  inducible SVT.  Isoproterenol was infused at a rate of 1-2 mcg/min. and  additional rapid atrial and ventricular pacing was  carried out, but there  was no inducible SVT.  At this point with the finding of adenosine-sensitive  SVT, nonsustained AV node reentry, discontinuous AV nodal conduction with  echo beats and double echo beats, the ablation catheter was maneuvered into  Koch's triangle.  Mapping was carried out demonstrating a very small Koch's  triangle.  A total of eight RF energy applications were delivered to sites 6  through 8 in Koch's triangle, resulting in accelerated junctional rhythm.  Occasionally there were ventricular beats with retrograde block in the  atrium and RF energy application was immediately discontinued.  At this  point the patient was re-challenged with additional Isuprel and rapid atrial  pacing was again carried out, which demonstrated no inducible SVT, and  programmed atrial stimulation demonstrated no residual slow  pathway  conduction.  At this point the catheters were removed, hemostasis was  assured, and the patient was returned to his room in satisfactory condition.   COMPLICATIONS:  There were no immediate procedure complications.   RESULTS:  A.  Baseline ECG:  Baseline ECG demonstrates sinus rhythm with  right bundle branch block.  B.  Baseline intervals:  The sinus node cycle length was 744 msec, the PR  interval 197 msec, the HV interval 36 msec, the AH interval 112 msec.  C.  Rapid ventricular pacing:  Rapid ventricular pacing from the RV apex was  carried out at a cycle length of 600 msec and was stepwise decreased down to  340 msec, where VA Wenckebach was observed.  During rapid ventricular pacing  at 380 msec, there was inducible SVT, which was nonsustained.  D.  Programmed ventricular stimulation:  Programmed atrial stimulation was  carried out from the RV apex at base drive cycle length of 914 msec.  The S1-  S2 interval was stepwise decreased down to 300 msec, where the retrograde AV  node ERP was observed.  During programmed ventricular stimulation, the  atrial activation was midline and decremental but here was no inducible SVT.  E.  Rapid atrial pacing:  Rapid atrial pacing was carried out from the  coronary sinus and the high right atrium at base drive cycle length of 782  msec and stepwise decreased down to 460 msec, where AV Wenckebach was  observed.  During rapid atrial pacing the PR interval was equal to the RR  interval but there was no inducible SVT.  F.  Programmed atrial stimulation:  Programmed atrial stimulation was  carried out the from coronary sinus and the high right atrium at base drive  cycle lengths of 956, 500, and 400 msec.  The S1-S2 interval stepwise  decreased down to 330 msec, where the AV node ERP was observed.  During  programmed atrial stimulation, there were multiple AH jumps and echo beats and double echo beats,  but there was no inducible SVT.  G.   Arrhythmias observed:  AV node reentry tachycardia.  Initiation:  Rapid  ventricular pacing.  Duration:  Nonsustained.  Termination was spontaneous.  Cycle length 360 msec.  H.  Mapping of that Koch's triangle demonstrated very small size and compact  orientation in Koch's triangle.  1.  RF energy application:  A total of eight RF energy applications were      delivered to sites 6 through 8 in Koch's triangle, resulting in      accelerated junctional rhythm.   CONCLUSIONS:  This study demonstrates successful left physiologic study and  radiofrequency catheter ablation of a slow pathway, rendering the  slow  pathway noninducible in a patient with AV node reentry tachycardia.           ______________________________  Doylene Canning. Ladona Ridgel, M.D.     GWT/MEDQ  D:  07/18/2005  T:  07/19/2005  Job:  [id number]   cc:   Ellin Saba., M.D.  [address] [place]  [place] [address]   Learta Codding, M.D. [facility]  [address]  [address]  [place]  [place] [address]

## 2010-12-23 NOTE — Discharge Summary (Signed)
[place]  Patient:    VERDELL, DYKMAN                     MRN: [id number] Adm. Date:  [id number] Disc. Date: [id number] Attending:  Nelta Numbers Dictator:   Lavella Hammock, P.A. CC:         Lacretia Leigh. Quintella Reichert, M.D.   Referring Physician Discharge [place]  DATE OF BIRTH:  Oct 09, 1928  PROCEDURES:  None.  HOSPITAL COURSE:  Mr. Sahli is a 77 year old male with no known history of coronary artery disease who was shopping after playing golf and suddenly developed anterior chest pressure that did not radiate but was associated with a fluttering feeling as well as shortness of breath.  His wife drove him to the emergency room and he had an EKG that showed SVT with a rate of 172 and right bundle-branch block as well as lateral ST depression.  He had another EKG after he converted that showed a first degree AV block and right bundle-branch block but no other significant abnormalities.  His potassium was low at the time at 3.1.  Once his rhythm returned to normal sinus rhythm he was completely asymptomatic.  He was admitted to rule out MI and for further evaluation of his arrhythmia.  He was started on a beta blocker and had no further episodes of dysrhythmia overnight.  He tolerated the medication well. His blood pressure was stable.  He had been scheduled for a Cardiolite and an echocardiogram but the patient had eaten and the Cardiolite was unable to be performed.  The echocardiogram showed no significant abnormalities.  Because the patient was asymptomatic and because he had resolution of symptoms once his arrhythmia had resolved, and because he had no further arrhythmia, he was considered stable for discharge on March 15, 2001 with outpatient follow-up arranged.  LABORATORY DATA:  Chest x-ray:  Heart size and mediastinal contours are within normal limits; the lungs are clear.  Laboratory values:  Hemoglobin 15.1, hematocrit 42.7,  wbcs 9.3, platelets 197.  Sodium 136, potassium 3.9, chloride 105, CO2 25, BUN 22, creatinine 1.4, glucose 159.  Coags are within normal limits.  CK-MB #1 189/4.5; #2 139/2.0; troponin I negative x 2.  Total bilirubin minimally elevated at 1.3 with other CMET values within normal limits.  Total cholesterol 153, triglycerides 380, HDL 25, LDL 52.  TSH was not done - will try to add it to labs already drawn and get it as an outpatient if we are unable to do that.  DISCHARGE CONDITION:  Stable.  CONSULTS:  None.  COMPLICATIONS:  None.  DISCHARGE DIAGNOSES: 1. Supraventricular tachycardia. 2. Chest pain associated with supraventricular tachycardia. 3. Hypertension. 4. Hyperlipidemia. 5. Remote history of tobacco.  DISCHARGE INSTRUCTIONS:  ACTIVITY:  His activity level is to be as tolerated but he is to not overexert.  DIET:  He is to stick to a low salt, fat, and cholesterol diet.  SPECIAL INSTRUCTIONS:  He is not to eat or drink after midnight for a stress test on Monday, August 19.  He is to check in at the second floor at 8:45 a.m.  FOLLOW-UP:  He has a P.A. appointment with Dr. Tenny Craw on August 21 at noon.  He is to follow up with Dr. Quintella Reichert and keep his scheduled appointment.  DISCHARGE MEDICATIONS: 1. Coated aspirin 325 mg q.d. 2. Zocor 40 mg q.h.s. 3. Lopressor 50 mg one-half tablet b.i.d. 4. He is not to take  Micardis for now. DD:  03/15/01 TD:  03/15/01 Job: [id number]

## 2011-01-27 ENCOUNTER — Other Ambulatory Visit: Payer: Self-pay

## 2011-01-27 MED ORDER — DICLOFENAC-MISOPROSTOL 75-0.2 MG PO TABS: 1.0000 | ORAL_TABLET | Freq: Two times a day (BID) | ORAL | Status: DC

## 2011-03-17 ENCOUNTER — Other Ambulatory Visit: Payer: Self-pay

## 2011-03-17 DIAGNOSIS — N529 Male erectile dysfunction, unspecified: Secondary | ICD-10-CM

## 2011-03-17 MED ORDER — SILDENAFIL CITRATE 100 MG PO TABS: 100.0000 mg | ORAL_TABLET | ORAL | Status: DC | PRN

## 2011-03-17 NOTE — Telephone Encounter (Signed)
Per Dr. Scotty Court [facility]

## 2011-06-06 ENCOUNTER — Ambulatory Visit (INDEPENDENT_AMBULATORY_CARE_PROVIDER_SITE_OTHER): Payer: Medicare Other | Admitting: Family Medicine

## 2011-06-06 ENCOUNTER — Encounter: Payer: Self-pay | Admitting: Family Medicine

## 2011-06-06 VITALS — BP 116/76 | HR 75 | Temp 98.4°F | Wt 188.0 lb

## 2011-06-06 DIAGNOSIS — S60219A Contusion of unspecified wrist, initial encounter: Secondary | ICD-10-CM

## 2011-06-06 NOTE — Progress Notes (Signed)
  Subjective:    Patient ID: Brendan Long, male    DOB: 05-19-1929, 77 y.o.   MRN: [id number]  HPI Here for an injury to the right wrist which occurred at home on 05-23-11. While making his bed that day his hand slipped off the mattress and struck the corner of a bedside table. It immediately swelled up, and it has remained swollen and painful ever since. He has used a brace, ice, and Aleve with no improvement.   Review of Systems  Constitutional: Negative.   Musculoskeletal: Positive for joint swelling.       Objective:   Physical Exam  Constitutional: He appears well-developed and well-nourished.  Musculoskeletal:       The right wrist is mildly swollen and quite tender, especially over the ulnar side. ROM is decreased           Assessment & Plan:  This was a significant injury of some sort, so we will have Orthopedics evaluate him.

## 2011-09-19 DIAGNOSIS — M169 Osteoarthritis of hip, unspecified: Secondary | ICD-10-CM | POA: Diagnosis not present

## 2011-09-22 DIAGNOSIS — M25519 Pain in unspecified shoulder: Secondary | ICD-10-CM | POA: Diagnosis not present

## 2011-09-22 DIAGNOSIS — S0003XA Contusion of scalp, initial encounter: Secondary | ICD-10-CM | POA: Diagnosis not present

## 2011-09-22 DIAGNOSIS — S46909A Unspecified injury of unspecified muscle, fascia and tendon at shoulder and upper arm level, unspecified arm, initial encounter: Secondary | ICD-10-CM | POA: Diagnosis not present

## 2011-09-22 DIAGNOSIS — IMO0002 Reserved for concepts with insufficient information to code with codable children: Secondary | ICD-10-CM | POA: Diagnosis not present

## 2011-09-22 DIAGNOSIS — S0990XA Unspecified injury of head, initial encounter: Secondary | ICD-10-CM | POA: Diagnosis not present

## 2011-09-22 DIAGNOSIS — T148XXA Other injury of unspecified body region, initial encounter: Secondary | ICD-10-CM | POA: Diagnosis not present

## 2011-09-22 DIAGNOSIS — S1093XA Contusion of unspecified part of neck, initial encounter: Secondary | ICD-10-CM | POA: Diagnosis not present

## 2011-09-22 DIAGNOSIS — S4980XA Other specified injuries of shoulder and upper arm, unspecified arm, initial encounter: Secondary | ICD-10-CM | POA: Diagnosis not present

## 2011-10-04 DIAGNOSIS — M25519 Pain in unspecified shoulder: Secondary | ICD-10-CM | POA: Diagnosis not present

## 2011-12-04 ENCOUNTER — Ambulatory Visit: Payer: Medicare Other | Admitting: Endocrinology

## 2011-12-05 ENCOUNTER — Other Ambulatory Visit (INDEPENDENT_AMBULATORY_CARE_PROVIDER_SITE_OTHER): Payer: Medicare Other

## 2011-12-05 ENCOUNTER — Ambulatory Visit (INDEPENDENT_AMBULATORY_CARE_PROVIDER_SITE_OTHER)
Admission: RE | Admit: 2011-12-05 | Discharge: 2011-12-05 | Disposition: A | Discharge status: Home / Self Care | Payer: Medicare Other | Source: Ambulatory Visit | Attending: Endocrinology | Admitting: Endocrinology

## 2011-12-05 ENCOUNTER — Ambulatory Visit (INDEPENDENT_AMBULATORY_CARE_PROVIDER_SITE_OTHER): Payer: Medicare Other | Admitting: Endocrinology

## 2011-12-05 ENCOUNTER — Telehealth: Payer: Self-pay | Admitting: *Deleted

## 2011-12-05 ENCOUNTER — Encounter: Payer: Self-pay | Admitting: Endocrinology

## 2011-12-05 VITALS — BP 128/80 | HR 74 | Temp 98.2°F | Ht 70.5 in | Wt 194.0 lb

## 2011-12-05 DIAGNOSIS — R05 Cough: Secondary | ICD-10-CM | POA: Diagnosis not present

## 2011-12-05 DIAGNOSIS — Z79899 Other long term (current) drug therapy: Secondary | ICD-10-CM

## 2011-12-05 DIAGNOSIS — M1990 Unspecified osteoarthritis, unspecified site: Secondary | ICD-10-CM

## 2011-12-05 DIAGNOSIS — Z0000 Encounter for general adult medical examination without abnormal findings: Secondary | ICD-10-CM | POA: Insufficient documentation

## 2011-12-05 DIAGNOSIS — I10 Essential (primary) hypertension: Secondary | ICD-10-CM

## 2011-12-05 DIAGNOSIS — R059 Cough, unspecified: Secondary | ICD-10-CM

## 2011-12-05 DIAGNOSIS — E039 Hypothyroidism, unspecified: Secondary | ICD-10-CM

## 2011-12-05 DIAGNOSIS — Z125 Encounter for screening for malignant neoplasm of prostate: Secondary | ICD-10-CM

## 2011-12-05 DIAGNOSIS — E559 Vitamin D deficiency, unspecified: Secondary | ICD-10-CM

## 2011-12-05 DIAGNOSIS — E785 Hyperlipidemia, unspecified: Secondary | ICD-10-CM | POA: Diagnosis not present

## 2011-12-05 LAB — CBC WITH DIFFERENTIAL/PLATELET
Basophils Absolute: 0 10*3/uL (ref 0.0–0.1)
HCT: 45.8 % (ref 39.0–52.0)
Hemoglobin: 15.8 g/dL (ref 13.0–17.0)
Lymphs Abs: 2.4 10*3/uL (ref 0.7–4.0)
Monocytes Relative: 6.9 % (ref 3.0–12.0)
Neutro Abs: 4 10*3/uL (ref 1.4–7.7)
RDW: 12.5 % (ref 11.5–14.6)

## 2011-12-05 LAB — BASIC METABOLIC PANEL
CO2: 28 mEq/L (ref 19–32)
Chloride: 101 mEq/L (ref 96–112)
GFR: 47.01 mL/min — ABNORMAL LOW (ref >60.00)
Glucose, Bld: 105 mg/dL — ABNORMAL HIGH (ref 70–99)
Potassium: 3.9 mEq/L (ref 3.5–5.1)
Sodium: 137 mEq/L (ref 135–145)

## 2011-12-05 LAB — HEPATIC FUNCTION PANEL
ALT: 22 U/L (ref 0–53)
AST: 23 U/L (ref 0–37)
Alkaline Phosphatase: 73 U/L (ref 39–117)
Bilirubin, Direct: 0.1 mg/dL (ref 0.0–0.3)
Total Bilirubin: 1.2 mg/dL (ref 0.3–1.2)

## 2011-12-05 LAB — URINALYSIS, ROUTINE W REFLEX MICROSCOPIC
Urine Glucose: NEGATIVE
Urobilinogen, UA: 0.2 (ref 0.0–1.0)

## 2011-12-05 LAB — LDL CHOLESTEROL, DIRECT: Direct LDL: 45.5 mg/dL

## 2011-12-05 IMAGING — CR DG CHEST 2V
2 series · 2 of 2 positions shown · non-contrast
Comparison: [DATE]

CLINICAL DATA: Cough, hypertension.

CHEST - 2 VIEW

[view not recorded (1 of 2)]
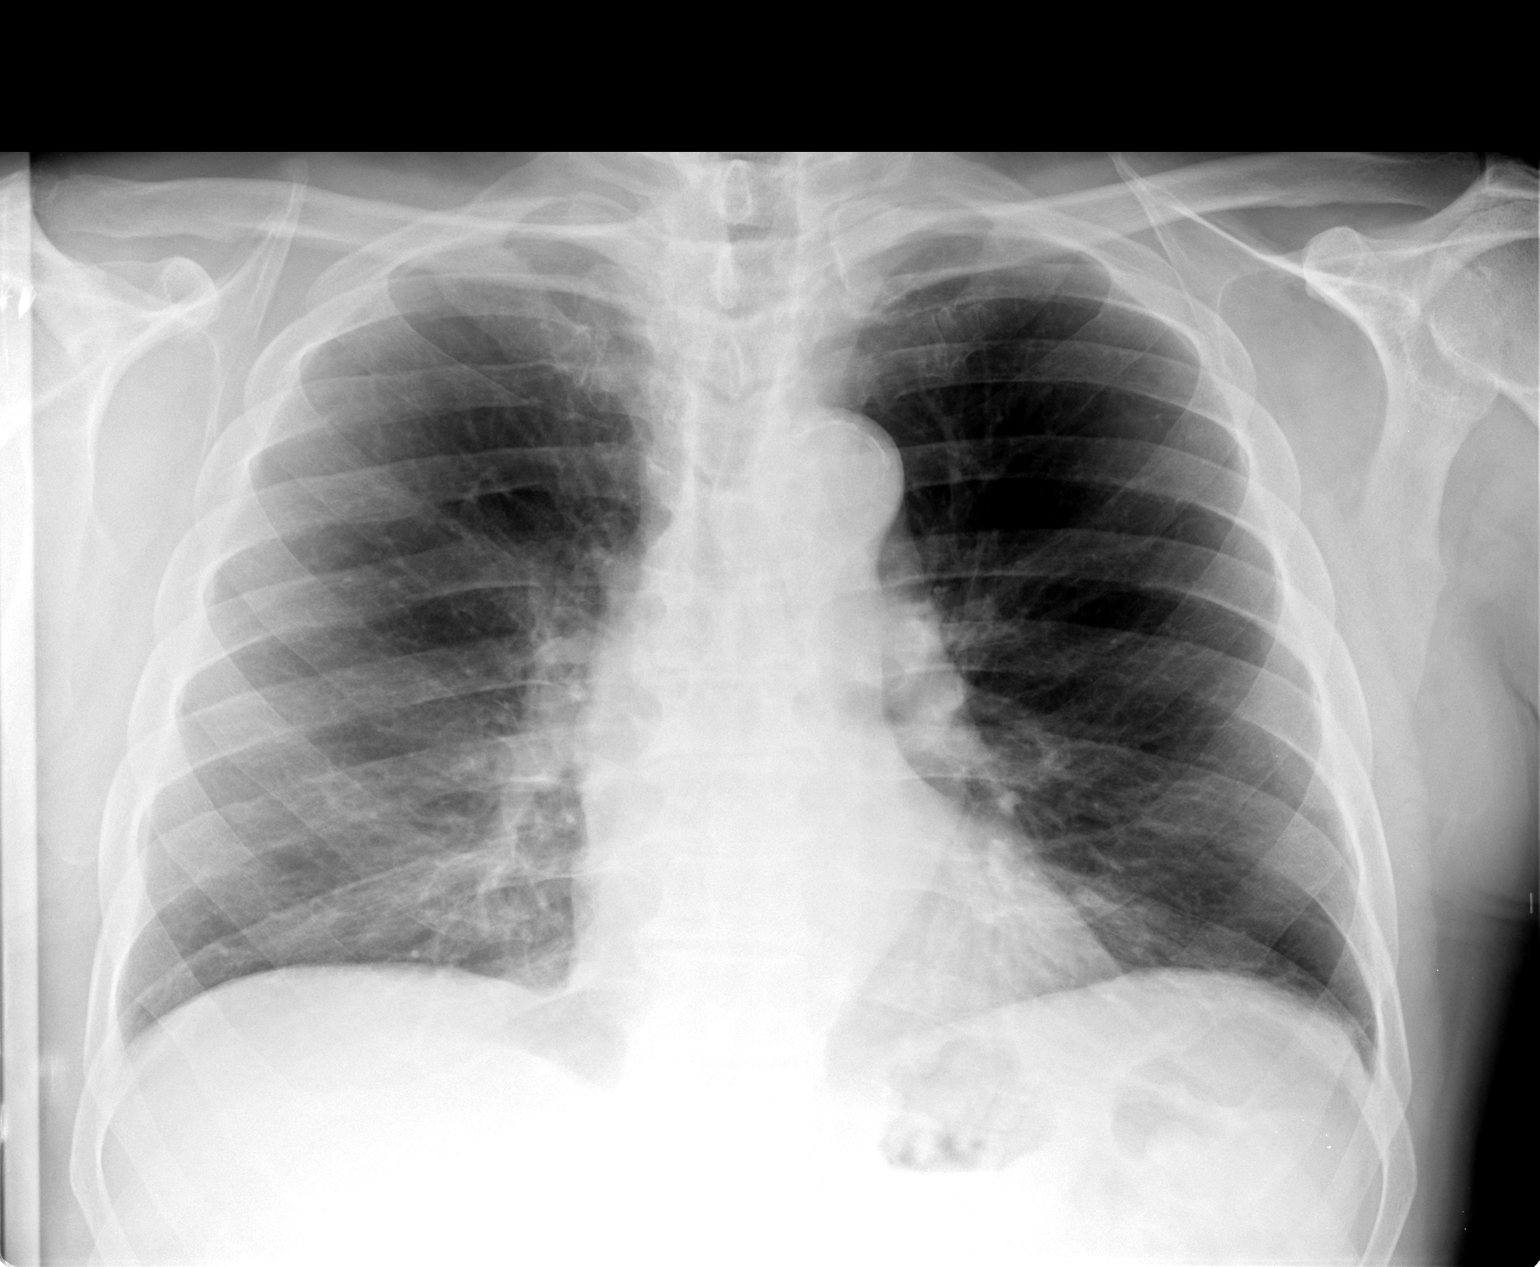

[view not recorded (2 of 2)]
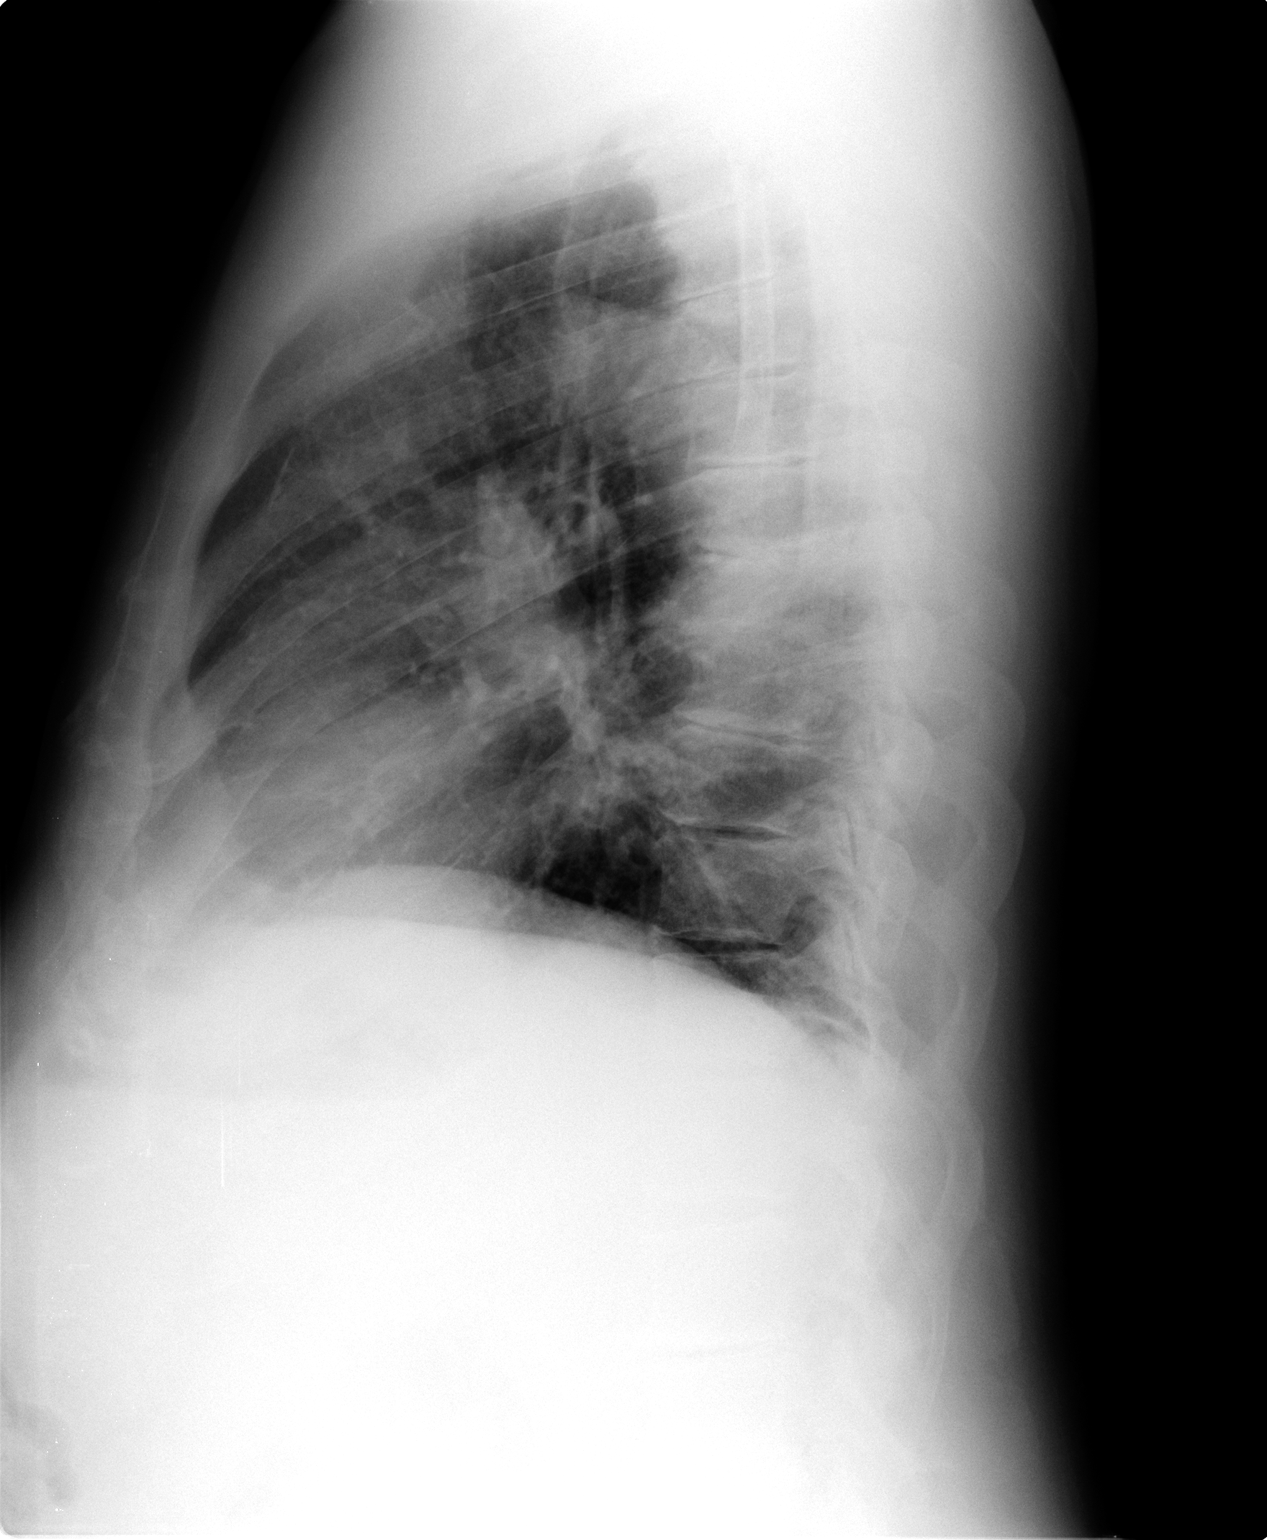

[2 of 2 positions shown; findings below may reference images not displayed]

FINDINGS: Mild peribronchial thickening.  Heart is normal size.
Left base atelectasis or scarring, similar to prior study.  Right
lung is clear.  No effusions.  No acute bony abnormality.
IMPRESSION: Bronchitic changes.  Left base scarring or atelectasis.

## 2011-12-05 MED ORDER — ROSUVASTATIN CALCIUM 10 MG PO TABS: 10.0000 mg | ORAL_TABLET | Freq: Every day | ORAL | Status: DC

## 2011-12-05 MED ORDER — DICLOFENAC-MISOPROSTOL 75-0.2 MG PO TBEC: 1.0000 | DELAYED_RELEASE_TABLET | Freq: Two times a day (BID) | ORAL | Status: DC

## 2011-12-05 MED ORDER — HYDROCHLOROTHIAZIDE 25 MG PO TABS: 25.0000 mg | ORAL_TABLET | Freq: Every day | ORAL | Status: DC

## 2011-12-05 MED ORDER — METOPROLOL TARTRATE 25 MG PO TABS: 25.0000 mg | ORAL_TABLET | Freq: Two times a day (BID) | ORAL | Status: DC

## 2011-12-05 NOTE — Patient Instructions (Addendum)
please consider these measures for your health:  minimize alcohol.  do not use tobacco products.  have a colonoscopy at least every 10 years from age 78.  keep firearms safely stored.  always use seat belts.  have working smoke alarms in your home.  see an eye doctor and dentist regularly.  never drive under the influence of alcohol or drugs (including prescription drugs).  those with fair skin should take precautions against the sun. please let me know what your wishes would be, if artificial life support measures should become necessary.  it is critically important to prevent falling down (keep floor areas well-lit, dry, and free of loose objects.  If you have a cane, walker, or wheelchair, you should use it, even for short trips around the house.  Also, try not to rush) You should have a vaccine against shingles (a painful rash which results from the  chickenpox infection which most people had many years ago).  This vaccine reduces, but does not totally eliminate the risk of shingles.  Because this is a medicare part d benefit, you should get it at a pharmacy.   Please return in 1 year. blood tests are being requested for you today.  You will receive a letter with results. (update: we discussed code status.  pt requests full code, but would not want to be started or maintained on artificial life-support measures if there was not a reasonable chance of recovery)

## 2011-12-05 NOTE — Telephone Encounter (Signed)
Called pt to inform of results of chest xray, left message for pt to callback office (letter also mailed to pt).

## 2011-12-05 NOTE — Progress Notes (Signed)
Subjective:    Patient ID: Brendan Long, male    DOB: 1928-12-26, 78 y.o.   MRN: [id number]  HPI Pt states a few mos of a dry-quality cough in the chest, but no assoc pain.   He hasn't recently taken the vitamin-d. Past Medical History  Diagnosis Date  . Hyperlipidemia   . Hypertension     Past Surgical History  Procedure Date  . Rotator cuff repair, right shoulder per dr. Shelle Iron 1996  . Torn right bicepts muscle 1996  . 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 Joint replacement   . Joint replacement rt and left     History   Social History  . Marital Status: Married    Spouse Name: N/A    Number of Children: N/A  . Years of Education: N/A   Occupational History  . Not on file.   Social History Main Topics  . Smoking status: Former Games developer  . Smokeless tobacco: Never Used  . Alcohol Use: No  . Drug Use: No  . Sexually Active: Yes   Other Topics Concern  . Not on file   Social History Narrative  . No narrative on file    Current Outpatient Prescriptions on File Prior to Visit  Medication Sig Dispense Refill  . clobetasol (TEMOVATE) 0.05 % ointment Apply topically 2 (two) times daily.        . hydrochlorothiazide (HYDRODIURIL) 25 MG tablet Take 1 tablet (25 mg total) by mouth daily.  90 tablet  3  . metoprolol tartrate (LOPRESSOR) 25 MG tablet Take 1 tablet (25 mg total) by mouth 2 (two) times daily.  180 tablet  3  . sildenafil (VIAGRA) 100 MG tablet Take 1 tablet (100 mg total) by mouth as needed for erectile dysfunction. Take 1 one hour before needed  18 tablet  3  . fenofibrate 160 MG tablet Take 1 tablet (160 mg total) by mouth daily.  90 tablet  3    No Known Allergies  No family history on file.  BP 128/80  Pulse 74  Temp(Src) 98.2 F (36.8 C) (Oral)  Ht 5' 10.5" (1.791 m)  Wt 194 lb (87.998 kg)  BMI 27.44 kg/m2  SpO2 95%  Review of Systems Denies fever and sob    Objective:   Physical Exam VITAL SIGNS:  See  vs page GENERAL: no distress NECK: There is no palpable thyroid enlargement.  No thyroid nodule is palpable.  No palpable lymphadenopathy at the anterior neck. LUNGS:  Clear to auscultation HEART:  Regular rate and rhythm without murmurs noted. Normal S1,S2.   Pulses: no carotid bruit.      Lab Results  Component Value Date   WBC 7.8 12/05/2011   HGB 15.8 12/05/2011   HCT 45.8 12/05/2011   PLT 166.0 12/05/2011   GLUCOSE 105* 12/05/2011   CHOL 222* 12/05/2011   TRIG 1136.0* 12/05/2011   HDL 28.60* 12/05/2011   LDLDIRECT 45.5 12/05/2011   ALT 22 12/05/2011   AST 23 12/05/2011   NA 137 12/05/2011   K 3.9 12/05/2011   CL 101 12/05/2011   CREATININE 1.5 12/05/2011   BUN 24* 12/05/2011   CO2 28 12/05/2011   TSH 1.70 12/05/2011   PSA 3.49 12/05/2011  CXR: NAD Vit-d=25    Assessment & Plan:  elev TG, needs increased rx Cough, chronic but new to me, prob allergic Low vit-d, mild.    Subjective:   Patient here for Medicare annual  wellness visit and management of other chronic and acute problems.     Risk factors: advanced age    Roster of Physicians Providing Medical Care to Patient:  He sees no other drs now.    Pt lives alone--widowed Activities of Daily Living: In your present state of health, do you have any difficulty performing the following activities?:  Preparing food and eating?: No  Bathing yourself: No  Getting dressed: No  Using the toilet: No  Moving around from place to place: No  In the past year have you fallen or had a near fall?: No    Home Safety: Has smoke detector and wears seat belts. Firearms are safely stored. No excess sun exposure.  Diet and Exercise  Current exercise habits: pt says good Dietary issues discussed: pt reports a healthy diet   Depression Screen  Q1: Over the past two weeks, have you felt down, depressed or hopeless? no  Q2: Over the past two weeks, have you felt little interest or pleasure in doing things? no   The following portions of  the patient's history were reviewed and updated as appropriate: allergies, current medications, past family history, past medical history, past social history, past surgical history and problem list.  Past Medical History  Diagnosis Date  . Hyperlipidemia   . Hypertension     Past Surgical History  Procedure Date  . Rotator cuff repair, right shoulder per dr. Shelle Iron 1996  . Torn right bicepts muscle 1996  . 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 Joint replacement   . Joint replacement rt and left     History   Social History  . Marital Status: Married    Spouse Name: N/A    Number of Children: N/A  . Years of Education: N/A   Occupational History  . Not on file.   Social History Main Topics  . Smoking status: Former Games developer  . Smokeless tobacco: Never Used  . Alcohol Use: No  . Drug Use: No  . Sexually Active: Yes   Other Topics Concern  . Not on file   Social History Narrative  . No narrative on file    Current Outpatient Prescriptions on File Prior to Visit  Medication Sig Dispense Refill  . clobetasol (TEMOVATE) 0.05 % ointment Apply topically 2 (two) times daily.        . diclofenac-misoprostol (ARTHROTEC 75) 75-0.2 MG per tablet Take 1 tablet by mouth 2 (two) times daily.  180 tablet  3  . hydrochlorothiazide 25 MG tablet Take 1 tablet (25 mg total) by mouth daily.  30 tablet  11  . metoprolol tartrate (LOPRESSOR) 25 MG tablet Take 1 tablet (25 mg total) by mouth 2 (two) times daily.  60 tablet  11  . rosuvastatin (CRESTOR) 10 MG tablet Take 1 tablet (10 mg total) by mouth daily.  30 tablet  11  . sildenafil (VIAGRA) 100 MG tablet Take 1 tablet (100 mg total) by mouth as needed for erectile dysfunction. Take 1 one hour before needed  18 tablet  3    No Known Allergies  No family history on file.  BP 128/80  Pulse 74  Temp(Src) 98.2 F (36.8 C) (Oral)  Ht 5' 10.5" (1.791 m)  Wt 194 lb (87.998 kg)  BMI 27.44 kg/m2  SpO2  95%   Review of Systems  Denies hearing loss, and visual loss Objective:   Vision:  Sees opthalmologist Hearing: grossly normal Body  mass index:  See vs page Msk: pt easily and quickly performs "get-up-and-go" from a sitting position Cognitive Impairment Assessment: cognition, memory and judgment appear normal.  remembers 3/3 at 5 minutes.  excellent recall.  can easily read and write a sentence.  alert and oriented x 3   Assessment:   Medicare wellness utd on preventive parameters    Plan:   During the course of the visit the patient was educated and counseled about appropriate screening and preventive services including:        Fall prevention Diabetes screening  Nutrition counseling   Vaccines / LABS Zostavax / Pnemonccoal Vaccine  today  PSA  Patient Instructions (the written plan) was given to the patient.

## 2011-12-05 NOTE — Telephone Encounter (Signed)
Pt informed of results.

## 2011-12-06 ENCOUNTER — Telehealth: Payer: Self-pay | Admitting: *Deleted

## 2011-12-06 ENCOUNTER — Other Ambulatory Visit: Payer: Self-pay | Admitting: *Deleted

## 2011-12-06 ENCOUNTER — Encounter: Payer: Self-pay | Admitting: Endocrinology

## 2011-12-06 LAB — VITAMIN D 25 HYDROXY (VIT D DEFICIENCY, FRACTURES): Vit D, 25-Hydroxy: 25 ng/mL — ABNORMAL LOW (ref 30–89)

## 2011-12-06 LAB — LIPID PANEL
HDL: 28.6 mg/dL — ABNORMAL LOW (ref >39.00)
Total CHOL/HDL Ratio: 8
VLDL: 227.2 mg/dL — ABNORMAL HIGH (ref 0.0–40.0)

## 2011-12-06 NOTE — Telephone Encounter (Signed)
Pt informed of lab results and would like rx sent to [facility] for his triglycerides. Pt also asked about rx that was dicussed at OV for his chest congestion.

## 2011-12-06 NOTE — Telephone Encounter (Signed)
Called pt to inform of lab results, pt informed (letter also mailed to pt). 

## 2011-12-07 MED ORDER — BENZONATATE 100 MG PO CAPS: 100.0000 mg | ORAL_CAPSULE | Freq: Three times a day (TID) | ORAL | Status: AC | PRN

## 2011-12-07 MED ORDER — FENOFIBRATE 160 MG PO TABS: 160.0000 mg | ORAL_TABLET | Freq: Every day | ORAL | Status: DC

## 2011-12-07 MED ORDER — ROSUVASTATIN CALCIUM 5 MG PO TABS: 5.0000 mg | ORAL_TABLET | Freq: Every day | ORAL | Status: DC

## 2011-12-07 NOTE — Telephone Encounter (Signed)
Left message to inform pt that rx for Crestor, Fenofibrate, and rx for chest congestion have been sent to his pharmacy and to callback office with any questions/concerns.

## 2011-12-07 NOTE — Telephone Encounter (Signed)
i sent rxs. You only need crestor 5 mg qd.

## 2012-02-27 ENCOUNTER — Telehealth: Payer: Self-pay | Admitting: Endocrinology

## 2012-02-27 NOTE — Telephone Encounter (Signed)
Can you call pt to schedule an OV for tomorrow? Thanks.

## 2012-02-27 NOTE — Telephone Encounter (Signed)
Please offer ov tomorrow 

## 2012-02-27 NOTE — Telephone Encounter (Signed)
Caller: Brendan Long/Patient; PCP: Romero Belling; CB#: [phone]; Pt calling today 02/27/12 regarding has rash on both hands and arms.  Says is little spots that itches.  Very flakey.  Onset 02/20/12.  No insect bites. Skin peeling.  Said wrists are a little swollen.  Emergent symptoms r/o by Rash guidelines with exception of new onset of joint swelling or pain. OFFICE PLEASE CALL PT FOR POSSIBLE WORK IN APPT THIS EVENING, OR REFER TO URGENT CARE IF NEEDED.

## 2012-02-28 ENCOUNTER — Other Ambulatory Visit (INDEPENDENT_AMBULATORY_CARE_PROVIDER_SITE_OTHER): Payer: Medicare Other

## 2012-02-28 ENCOUNTER — Ambulatory Visit (INDEPENDENT_AMBULATORY_CARE_PROVIDER_SITE_OTHER): Payer: Medicare Other | Admitting: Endocrinology

## 2012-02-28 ENCOUNTER — Encounter: Payer: Self-pay | Admitting: Endocrinology

## 2012-02-28 VITALS — BP 124/82 | HR 72 | Temp 97.4°F | Wt 193.0 lb

## 2012-02-28 DIAGNOSIS — E785 Hyperlipidemia, unspecified: Secondary | ICD-10-CM | POA: Diagnosis not present

## 2012-02-28 LAB — LIPID PANEL
Cholesterol: 201 mg/dL — ABNORMAL HIGH (ref 0–200)
VLDL: 86.8 mg/dL — ABNORMAL HIGH (ref 0.0–40.0)

## 2012-02-28 MED ORDER — TRIAMCINOLONE ACETONIDE 0.1 % EX CREA: TOPICAL_CREAM | Freq: Two times a day (BID) | CUTANEOUS | Status: AC

## 2012-02-28 NOTE — Patient Instructions (Addendum)
i have sent a prescription to your pharmacy, for a skin cream.   I hope you feel better soon.  If you don't feel better by next week, please call back.  Please call sooner if you get worse. It is ok with me to continue the fenofibrate for now.   blood tests are being requested for you today.  You will receive a letter with results.

## 2012-02-28 NOTE — Progress Notes (Signed)
  Subjective:    Patient ID: Brendan Long, male    DOB: [birth year], 78 y.o.   MRN: [id number]  HPI Pt states 1 week of moderate rash at the forearms.  No assoc rash under wristwatch band.  He would like to continue the fenofibrate if at all possible.  Past Medical History  Diagnosis Date  . Hyperlipidemia   . Hypertension     Past Surgical History  Procedure Date  . Rotator cuff repair, right shoulder per dr. Shelle Iron 1996  . Torn right bicepts muscle 1996  . 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 Joint replacement   . Joint replacement rt and left     History   Social History  . Marital Status: Married    Spouse Name: N/A    Number of Children: N/A  . Years of Education: N/A   Occupational History  . Not on file.   Social History Main Topics  . Smoking status: Former Games developer  . Smokeless tobacco: Never Used  . Alcohol Use: No  . Drug Use: No  . Sexually Active: Yes   Other Topics Concern  . Not on file   Social History Narrative  . No narrative on file    Current Outpatient Prescriptions on File Prior to Visit  Medication Sig Dispense Refill  . aspirin 325 MG tablet Take 325 mg by mouth daily.      . clobetasol (TEMOVATE) 0.05 % ointment Apply topically 2 (two) times daily.        . Diclofenac-Misoprostol (ARTHROTEC) 75-0.2 MG TBEC Take 1 tablet by mouth 2 (two) times daily.  180 tablet  3  . fenofibrate 160 MG tablet Take 1 tablet (160 mg total) by mouth daily.  90 tablet  3  . hydrochlorothiazide (HYDRODIURIL) 25 MG tablet Take 1 tablet (25 mg total) by mouth daily.  90 tablet  3  . metoprolol tartrate (LOPRESSOR) 25 MG tablet Take 1 tablet (25 mg total) by mouth 2 (two) times daily.  180 tablet  3  . niacin 500 MG tablet Take 500 mg by mouth daily.      . rosuvastatin (CRESTOR) 5 MG tablet Take 1 tablet (5 mg total) by mouth daily.  90 tablet  3  . sildenafil (VIAGRA) 100 MG tablet Take 1 tablet (100 mg total) by mouth as needed  for erectile dysfunction. Take 1 one hour before needed  18 tablet  3   No Known Allergies  No family history on file.  BP 124/82  Pulse 72  Temp 97.4 F (36.3 C) (Oral)  Wt 193 lb (87.544 kg)  SpO2 95%  Review of Systems He has severe itching there, but no fever.     Objective:   Physical Exam VITAL SIGNS:  See vs page. GENERAL: no distress. Forearms:  Moderate eczematous rash.       Assessment & Plan:  Rash, new, uncertain etiology, new.  ? Drug rash Dyslipidemia.  Pt says he kiles the fenofibrate,a dn he says he'll avoid the sun in order to continue it.

## 2012-02-28 NOTE — Telephone Encounter (Signed)
Gave pt 7/24 @ 930a w/dr sae-

## 2012-03-01 ENCOUNTER — Telehealth: Payer: Self-pay | Admitting: *Deleted

## 2012-03-01 NOTE — Telephone Encounter (Signed)
Called pt to inform of lab results, left message for pt to callback office (letter also mailed to pt). 

## 2012-03-04 NOTE — Telephone Encounter (Signed)
Left message for pt to callback office.  

## 2012-03-06 MED ORDER — OMEGA-3-ACID ETHYL ESTERS 1 G PO CAPS: 2.0000 g | ORAL_CAPSULE | Freq: Two times a day (BID) | ORAL | Status: DC

## 2012-03-06 NOTE — Telephone Encounter (Signed)
i sent rx 

## 2012-03-06 NOTE — Telephone Encounter (Signed)
Pt informed of new rx.  

## 2012-03-06 NOTE — Telephone Encounter (Signed)
Pt informed of lab results, he would like rx for Prescription fish oil sent to [facility].

## 2012-03-08 ENCOUNTER — Telehealth: Payer: Self-pay | Admitting: *Deleted

## 2012-03-08 DIAGNOSIS — Z0279 Encounter for issue of other medical certificate: Secondary | ICD-10-CM

## 2012-03-08 NOTE — Telephone Encounter (Signed)
R'cd fax from [facility] requesting PA for Lovaza. PA faxed to insurance company, awaiting reply.

## 2012-03-13 NOTE — Telephone Encounter (Signed)
PA for Lovaza refax to insurance company (no response as of 03/13/2012 ).

## 2012-03-15 NOTE — Telephone Encounter (Signed)
R'cd response back from [employer] stating that they were unable to verify pt's ID # and that PA had not been started yet-refaxed PA along with information from pharmacy with pt's ID# to insurance company.

## 2012-03-19 NOTE — Telephone Encounter (Signed)
PA approved-02/08/2012-08/06/2098, [facility] informed.

## 2012-05-13 DIAGNOSIS — H01019 Ulcerative blepharitis unspecified eye, unspecified eyelid: Secondary | ICD-10-CM | POA: Diagnosis not present

## 2012-05-13 DIAGNOSIS — H2510 Age-related nuclear cataract, unspecified eye: Secondary | ICD-10-CM | POA: Diagnosis not present

## 2012-05-13 DIAGNOSIS — H524 Presbyopia: Secondary | ICD-10-CM | POA: Diagnosis not present

## 2012-08-19 ENCOUNTER — Other Ambulatory Visit: Payer: Self-pay | Admitting: Endocrinology

## 2012-09-18 DIAGNOSIS — M48061 Spinal stenosis, lumbar region without neurogenic claudication: Secondary | ICD-10-CM | POA: Diagnosis not present

## 2012-09-18 DIAGNOSIS — M25559 Pain in unspecified hip: Secondary | ICD-10-CM | POA: Diagnosis not present

## 2012-10-09 ENCOUNTER — Other Ambulatory Visit: Payer: Self-pay | Admitting: Endocrinology

## 2012-10-19 ENCOUNTER — Other Ambulatory Visit: Payer: Self-pay | Admitting: Endocrinology

## 2012-10-21 ENCOUNTER — Other Ambulatory Visit: Payer: Self-pay | Admitting: *Deleted

## 2012-10-21 MED ORDER — ROSUVASTATIN CALCIUM 5 MG PO TABS: 5.0000 mg | ORAL_TABLET | Freq: Every day | ORAL | Status: DC

## 2012-10-31 ENCOUNTER — Other Ambulatory Visit: Payer: Self-pay | Admitting: *Deleted

## 2012-10-31 ENCOUNTER — Other Ambulatory Visit: Payer: Self-pay | Admitting: Endocrinology

## 2012-10-31 MED ORDER — DICLOFENAC-MISOPROSTOL 75-0.2 MG PO TBEC: 1.0000 | DELAYED_RELEASE_TABLET | Freq: Two times a day (BID) | ORAL | Status: DC

## 2012-12-05 ENCOUNTER — Ambulatory Visit (INDEPENDENT_AMBULATORY_CARE_PROVIDER_SITE_OTHER): Payer: Medicare Other | Admitting: Endocrinology

## 2012-12-05 ENCOUNTER — Encounter: Payer: Self-pay | Admitting: Endocrinology

## 2012-12-05 VITALS — BP 130/70 | HR 70 | Wt 192.0 lb

## 2012-12-05 DIAGNOSIS — Z79899 Other long term (current) drug therapy: Secondary | ICD-10-CM | POA: Diagnosis not present

## 2012-12-05 DIAGNOSIS — I10 Essential (primary) hypertension: Secondary | ICD-10-CM | POA: Diagnosis not present

## 2012-12-05 DIAGNOSIS — E039 Hypothyroidism, unspecified: Secondary | ICD-10-CM | POA: Diagnosis not present

## 2012-12-05 DIAGNOSIS — Z125 Encounter for screening for malignant neoplasm of prostate: Secondary | ICD-10-CM | POA: Diagnosis not present

## 2012-12-05 DIAGNOSIS — E785 Hyperlipidemia, unspecified: Secondary | ICD-10-CM | POA: Diagnosis not present

## 2012-12-05 DIAGNOSIS — E559 Vitamin D deficiency, unspecified: Secondary | ICD-10-CM

## 2012-12-05 DIAGNOSIS — Z129 Encounter for screening for malignant neoplasm, site unspecified: Secondary | ICD-10-CM

## 2012-12-05 LAB — LIPID PANEL
HDL: 18.9 mg/dL — ABNORMAL LOW (ref >39.00)
Total CHOL/HDL Ratio: 9
Triglycerides: 1166 mg/dL — ABNORMAL HIGH (ref 0.0–149.0)
VLDL: 233.2 mg/dL — ABNORMAL HIGH (ref 0.0–40.0)

## 2012-12-05 LAB — URINALYSIS, ROUTINE W REFLEX MICROSCOPIC
Bilirubin Urine: NEGATIVE
Hgb urine dipstick: NEGATIVE
Leukocytes, UA: NEGATIVE
Nitrite: NEGATIVE

## 2012-12-05 LAB — CBC WITH DIFFERENTIAL/PLATELET
Basophils Absolute: 0.1 10*3/uL (ref 0.0–0.1)
HCT: 44.8 % (ref 39.0–52.0)
Lymphs Abs: 3.5 10*3/uL (ref 0.7–4.0)
MCV: 91.5 fl (ref 78.0–100.0)
Monocytes Absolute: 0.5 10*3/uL (ref 0.1–1.0)
Monocytes Relative: 5.8 % (ref 3.0–12.0)
Neutrophils Relative %: 45.1 % (ref 43.0–77.0)
Platelets: 166 10*3/uL (ref 150.0–400.0)
RDW: 12.8 % (ref 11.5–14.6)

## 2012-12-05 LAB — PSA, MEDICARE: PSA: 3.82 ng/ml (ref 0.10–4.00)

## 2012-12-05 LAB — HEPATIC FUNCTION PANEL
Albumin: 3.8 g/dL (ref 3.5–5.2)
Alkaline Phosphatase: 69 U/L (ref 39–117)
Total Protein: 6.6 g/dL (ref 6.0–8.3)

## 2012-12-05 LAB — BASIC METABOLIC PANEL
Calcium: 9.1 mg/dL (ref 8.4–10.5)
GFR: 39.86 mL/min — ABNORMAL LOW (ref >60.00)
Glucose, Bld: 121 mg/dL — ABNORMAL HIGH (ref 70–99)
Sodium: 132 mEq/L — ABNORMAL LOW (ref 135–145)

## 2012-12-05 LAB — TSH: TSH: 2.17 u[IU]/mL (ref 0.35–5.50)

## 2012-12-05 NOTE — Patient Instructions (Signed)
please consider these measures for your health:  minimize alcohol.  do not use tobacco products.  have a colonoscopy at least every 10 years from age 79.  keep firearms safely stored.  always use seat belts.  have working smoke alarms in your home.  see an eye doctor and dentist regularly.  never drive under the influence of alcohol or drugs (including prescription drugs).  those with fair skin should take precautions against the sun. it is critically important to prevent falling down (keep floor areas well-lit, dry, and free of loose objects.  If you have a cane, walker, or wheelchair, you should use it, even for short trips around the house.  Also, try not to rush). good diet and exercise habits significanly improve your health.   blood tests are being requested for you today.  We'll contact you with results.  Please return in 1 year.

## 2012-12-05 NOTE — Progress Notes (Signed)
Subjective:    Patient ID: Brendan Long, male    DOB: [birth year], 79 y.o.   MRN: [id number]  HPI The state of at least three ongoing medical problems is addressed today, with interval history of each noted here: Dyslipidemia: he does not take fenofibrate.  He denies chest pain HTN: he denies sob. Hypothyroidism: he denies weight change Past Medical History  Diagnosis Date  . Hyperlipidemia   . Hypertension     Past Surgical History  Procedure Laterality Date  . Rotator cuff repair, right shoulder per dr. Shelle Iron  1996  . Torn right bicepts muscle  1996  . Total hip arthroplasty  01/17/10    redone on right hip on 09/05/10  . Joint replacement    . Joint replacement rt and left      History   Social History  . Marital Status: Married    Spouse Name: N/A    Number of Children: N/A  . Years of Education: N/A   Occupational History  . Not on file.   Social History Main Topics  . Smoking status: Former Games developer  . Smokeless tobacco: Never Used  . Alcohol Use: No  . Drug Use: No  . Sexually Active: Yes   Other Topics Concern  . Not on file   Social History Narrative  . No narrative on file    Current Outpatient Prescriptions on File Prior to Visit  Medication Sig Dispense Refill  . aspirin 325 MG tablet Take 325 mg by mouth daily.      . clobetasol (TEMOVATE) 0.05 % ointment Apply topically 2 (two) times daily.        . Diclofenac-Misoprostol (ARTHROTEC) 75-0.2 MG TBEC Take 1 tablet by mouth 2 (two) times daily.  180 tablet  3  . fenofibrate 160 MG tablet Take 1 tablet (160 mg total) by mouth daily.  90 tablet  3  . hydrochlorothiazide (HYDRODIURIL) 25 MG tablet TAKE 1 TABLET BY MOUTH DAILY  90 tablet  2  . metoprolol tartrate (LOPRESSOR) 25 MG tablet TAKE 1 TABLET BY MOUTH TWO TIMES DAILY  180 tablet  1  . niacin 500 MG tablet Take 500 mg by mouth daily.      Marland Kitchen omega-3 acid ethyl esters (LOVAZA) 1 G capsule Take 2 capsules (2 g total) by mouth 2 (two) times daily.   120 capsule  11  . rosuvastatin (CRESTOR) 5 MG tablet Take 1 tablet (5 mg total) by mouth daily.  90 tablet  3  . triamcinolone cream (KENALOG) 0.1 % Apply topically 2 (two) times daily.  30 g  0   No current facility-administered medications on file prior to visit.    No Known Allergies  No family history on file.  BP 130/70  Pulse 70  Wt 192 lb (87.091 kg)  BMI 27.15 kg/m2  SpO2 97%     Review of Systems  Musculoskeletal: Negative for back pain.  Neurological: Negative for syncope.      Objective:   Physical Exam VITAL SIGNS:  See vs page GENERAL: no distress NECK: There is no palpable thyroid enlargement.  No thyroid nodule is palpable.  No palpable lymphadenopathy at the anterior neck. LUNGS:  Clear to auscultation HEART:  Regular rate and rhythm without murmurs noted. Normal S1,S2.   Ext: no edema.     Assessment & Plan:  Dyslipidemia: he may need to resume fenofibrate Hypothyroidism, on rx HTN: well-controlled    Subjective:   Patient here for Medicare annual wellness visit  and management of other chronic and acute problems.     Risk factors: advanced age    Roster of Physicians Providing Medical Care to Patient:  See "snapshot"   Activities of Daily Living: In your present state of health, do you have any difficulty performing the following activities?:  Lives alone.  NOK are a brother and daughter (in [place]).   Preparing food and eating?: No  Bathing yourself: No  Getting dressed: No  Using the toilet:No  Moving around from place to place: No  In the past year have you fallen or had a near fall?: No    Home Safety: Has smoke detector and wears seat belts. Firearms are safely stored. No excess sun exposure.  Diet and Exercise  Current exercise habits: pt says good Dietary issues discussed: pt reports a healthy diet   Depression Screen  Q1: Over the past two weeks, have you felt down, depressed or hopeless?no  Q2: Over the past two weeks,  have you felt little interest or pleasure in doing things? no   The following portions of the patient's history were reviewed and updated as appropriate: allergies, current medications, past family history, past medical history, past social history, past surgical history and problem list.  Past Medical History  Diagnosis Date  . Hyperlipidemia   . Hypertension     Past Surgical History  Procedure Laterality Date  . Rotator cuff repair, right shoulder per dr. Shelle Iron  1996  . Torn right bicepts muscle  1996  . Total hip arthroplasty  01/17/10    redone on right hip on 09/05/10  . Joint replacement    . Joint replacement rt and left      History   Social History  . Marital Status: Married    Spouse Name: N/A    Number of Children: N/A  . Years of Education: N/A   Occupational History  . Not on file.   Social History Main Topics  . Smoking status: Former Games developer  . Smokeless tobacco: Never Used  . Alcohol Use: No  . Drug Use: No  . Sexually Active: Yes   Other Topics Concern  . Not on file   Social History Narrative  . No narrative on file    Current Outpatient Prescriptions on File Prior to Visit  Medication Sig Dispense Refill  . aspirin 325 MG tablet Take 325 mg by mouth daily.      . clobetasol (TEMOVATE) 0.05 % ointment Apply topically 2 (two) times daily.        . Diclofenac-Misoprostol (ARTHROTEC) 75-0.2 MG TBEC Take 1 tablet by mouth 2 (two) times daily.  180 tablet  3  . fenofibrate 160 MG tablet Take 1 tablet (160 mg total) by mouth daily.  90 tablet  3  . hydrochlorothiazide (HYDRODIURIL) 25 MG tablet TAKE 1 TABLET BY MOUTH DAILY  90 tablet  2  . metoprolol tartrate (LOPRESSOR) 25 MG tablet TAKE 1 TABLET BY MOUTH TWO TIMES DAILY  180 tablet  1  . niacin 500 MG tablet Take 500 mg by mouth daily.      Marland Kitchen omega-3 acid ethyl esters (LOVAZA) 1 G capsule Take 2 capsules (2 g total) by mouth 2 (two) times daily.  120 capsule  11  . rosuvastatin (CRESTOR) 5 MG tablet  Take 1 tablet (5 mg total) by mouth daily.  90 tablet  3  . sildenafil (VIAGRA) 100 MG tablet Take 1 tablet (100 mg total) by mouth as needed for erectile dysfunction.  Take 1 one hour before needed  18 tablet  3  . triamcinolone cream (KENALOG) 0.1 % Apply topically 2 (two) times daily.  30 g  0   No current facility-administered medications on file prior to visit.    No Known Allergies  No family history on file.  BP 130/70  Pulse 70  Wt 192 lb (87.091 kg)  BMI 27.15 kg/m2  SpO2 97%   Review of Systems  Denies hearing loss, and visual loss Objective:   Vision:  Sees opthalmologist Hearing: grossly normal Body mass index:  See vs page Msk: pt easily and quickly performs "get-up-and-go" from a sitting position Cognitive Impairment Assessment: cognition, memory and judgment appear normal.  remembers 3/3 at 5 minutes.  excellent recall.  can easily read and write a sentence.  alert and oriented x 3   Assessment:   Medicare wellness utd on preventive parameters    Plan:   During the course of the visit the patient was educated and counseled about appropriate screening and preventive services including:        Fall prevention   Diabetes screening  Nutrition counseling   Vaccines / LABS PSA  Patient Instructions (the written plan) was given to the patient.  we discussed code status.  pt requests full code, but would not want to be started or maintained on artificial life-support measures if there was not a reasonable chance of recovery

## 2012-12-06 ENCOUNTER — Other Ambulatory Visit: Payer: Self-pay | Admitting: Endocrinology

## 2012-12-06 LAB — PTH, INTACT AND CALCIUM: PTH: 82.6 pg/mL — ABNORMAL HIGH (ref 14.0–72.0)

## 2012-12-06 MED ORDER — FENOFIBRATE 160 MG PO TABS: 160.0000 mg | ORAL_TABLET | Freq: Every day | ORAL | Status: DC

## 2012-12-11 ENCOUNTER — Encounter: Payer: Self-pay | Admitting: Internal Medicine

## 2013-01-03 ENCOUNTER — Encounter: Payer: Self-pay | Admitting: Internal Medicine

## 2013-01-03 ENCOUNTER — Ambulatory Visit (INDEPENDENT_AMBULATORY_CARE_PROVIDER_SITE_OTHER): Payer: Medicare Other | Admitting: Internal Medicine

## 2013-01-03 VITALS — BP 102/62 | HR 80 | Ht 70.5 in | Wt 188.0 lb

## 2013-01-03 DIAGNOSIS — Z1211 Encounter for screening for malignant neoplasm of colon: Secondary | ICD-10-CM

## 2013-01-03 NOTE — Progress Notes (Signed)
Patient ID: Brendan Long, male   DOB: 05/26/1929, 79 y.o.   MRN: [id number]   The patient is here to discuss a possible screening colonoscopy. He had a colonoscopy about 10 years ago, with a small diminutive polyp that was destroyed. There was no pathology. He has absolutely no gastrointestinal symptoms at this time. He is really not interested in pursuing a colonoscopy. I told him that that is a reasonable approach at his age though he does have several family members that lived into 72's he might live another 10 years as he does not have major comorbidities and is very active.  We have decided that he will do immune fecal occult blood testing. If that's positive he would pursue a colonoscopy. I have reviewed the risks benefits and indications of a colonoscopy fully with him.

## 2013-01-03 NOTE — Patient Instructions (Addendum)
Your physician has requested that you go to the basement for the following lab work before leaving today: IFOB   Thank you for choosing me and  Gastroenterology.  Iva Boop, M.D., [facility]

## 2013-01-07 ENCOUNTER — Other Ambulatory Visit (INDEPENDENT_AMBULATORY_CARE_PROVIDER_SITE_OTHER): Payer: Medicare Other

## 2013-01-07 DIAGNOSIS — Z1211 Encounter for screening for malignant neoplasm of colon: Secondary | ICD-10-CM | POA: Diagnosis not present

## 2013-01-07 NOTE — Progress Notes (Signed)
Quick Note:  No blood in stool Would not do a colonoscopy If he wants can place a recall for iFOBT in 1 year - I think it is a reasonable option ______

## 2013-03-10 ENCOUNTER — Other Ambulatory Visit: Payer: Self-pay

## 2013-03-10 MED ORDER — OMEGA-3-ACID ETHYL ESTERS 1 G PO CAPS: 2.0000 g | ORAL_CAPSULE | Freq: Two times a day (BID) | ORAL | Status: DC

## 2013-03-11 ENCOUNTER — Other Ambulatory Visit: Payer: Self-pay

## 2013-03-11 MED ORDER — OMEGA-3-ACID ETHYL ESTERS 1 G PO CAPS: 2.0000 g | ORAL_CAPSULE | Freq: Two times a day (BID) | ORAL | Status: DC

## 2013-03-19 DIAGNOSIS — L600 Ingrowing nail: Secondary | ICD-10-CM | POA: Diagnosis not present

## 2013-04-08 ENCOUNTER — Other Ambulatory Visit: Payer: Self-pay | Admitting: Endocrinology

## 2013-04-08 ENCOUNTER — Other Ambulatory Visit: Payer: Self-pay | Admitting: *Deleted

## 2013-04-08 MED ORDER — METOPROLOL TARTRATE 25 MG PO TABS: 25.0000 mg | ORAL_TABLET | Freq: Two times a day (BID) | ORAL | Status: DC

## 2013-06-26 DIAGNOSIS — H04129 Dry eye syndrome of unspecified lacrimal gland: Secondary | ICD-10-CM | POA: Diagnosis not present

## 2013-06-26 DIAGNOSIS — H2510 Age-related nuclear cataract, unspecified eye: Secondary | ICD-10-CM | POA: Diagnosis not present

## 2013-06-26 DIAGNOSIS — H01019 Ulcerative blepharitis unspecified eye, unspecified eyelid: Secondary | ICD-10-CM | POA: Diagnosis not present

## 2013-08-30 ENCOUNTER — Other Ambulatory Visit: Payer: Self-pay | Admitting: Endocrinology

## 2013-09-17 DIAGNOSIS — M1610 Unilateral primary osteoarthritis, unspecified hip: Secondary | ICD-10-CM | POA: Diagnosis not present

## 2013-09-17 DIAGNOSIS — M169 Osteoarthritis of hip, unspecified: Secondary | ICD-10-CM | POA: Diagnosis not present

## 2013-09-26 ENCOUNTER — Other Ambulatory Visit: Payer: Self-pay | Admitting: Endocrinology

## 2013-10-05 ENCOUNTER — Other Ambulatory Visit: Payer: Self-pay | Admitting: Endocrinology

## 2013-10-28 ENCOUNTER — Other Ambulatory Visit: Payer: Self-pay | Admitting: Endocrinology

## 2013-11-12 ENCOUNTER — Other Ambulatory Visit: Payer: Self-pay | Admitting: Endocrinology

## 2013-12-10 DIAGNOSIS — L408 Other psoriasis: Secondary | ICD-10-CM | POA: Diagnosis not present

## 2013-12-10 DIAGNOSIS — L821 Other seborrheic keratosis: Secondary | ICD-10-CM | POA: Diagnosis not present

## 2013-12-10 DIAGNOSIS — L570 Actinic keratosis: Secondary | ICD-10-CM | POA: Diagnosis not present

## 2013-12-15 ENCOUNTER — Ambulatory Visit (INDEPENDENT_AMBULATORY_CARE_PROVIDER_SITE_OTHER): Payer: Medicare Other | Admitting: Endocrinology

## 2013-12-15 ENCOUNTER — Encounter: Payer: Self-pay | Admitting: Endocrinology

## 2013-12-15 ENCOUNTER — Other Ambulatory Visit: Payer: Self-pay | Admitting: Endocrinology

## 2013-12-15 VITALS — BP 124/80 | HR 74 | Temp 98.1°F | Ht 70.5 in | Wt 190.0 lb

## 2013-12-15 DIAGNOSIS — N289 Disorder of kidney and ureter, unspecified: Secondary | ICD-10-CM

## 2013-12-15 DIAGNOSIS — E559 Vitamin D deficiency, unspecified: Secondary | ICD-10-CM

## 2013-12-15 DIAGNOSIS — Z129 Encounter for screening for malignant neoplasm, site unspecified: Secondary | ICD-10-CM

## 2013-12-15 DIAGNOSIS — E785 Hyperlipidemia, unspecified: Secondary | ICD-10-CM

## 2013-12-15 DIAGNOSIS — R0609 Other forms of dyspnea: Secondary | ICD-10-CM

## 2013-12-15 DIAGNOSIS — M79609 Pain in unspecified limb: Secondary | ICD-10-CM

## 2013-12-15 DIAGNOSIS — M79641 Pain in right hand: Secondary | ICD-10-CM | POA: Insufficient documentation

## 2013-12-15 DIAGNOSIS — Z0000 Encounter for general adult medical examination without abnormal findings: Secondary | ICD-10-CM

## 2013-12-15 DIAGNOSIS — R0989 Other specified symptoms and signs involving the circulatory and respiratory systems: Secondary | ICD-10-CM

## 2013-12-15 DIAGNOSIS — I10 Essential (primary) hypertension: Secondary | ICD-10-CM

## 2013-12-15 DIAGNOSIS — Z125 Encounter for screening for malignant neoplasm of prostate: Secondary | ICD-10-CM

## 2013-12-15 DIAGNOSIS — Z79899 Other long term (current) drug therapy: Secondary | ICD-10-CM

## 2013-12-15 DIAGNOSIS — R7309 Other abnormal glucose: Secondary | ICD-10-CM | POA: Diagnosis not present

## 2013-12-15 DIAGNOSIS — E039 Hypothyroidism, unspecified: Secondary | ICD-10-CM | POA: Diagnosis not present

## 2013-12-15 DIAGNOSIS — R739 Hyperglycemia, unspecified: Secondary | ICD-10-CM | POA: Insufficient documentation

## 2013-12-15 DIAGNOSIS — R0600 Dyspnea, unspecified: Secondary | ICD-10-CM | POA: Insufficient documentation

## 2013-12-15 NOTE — Progress Notes (Signed)
Subjective:    Patient ID: Brendan Long, male    DOB: 17-Oct-1928, 80 y.o.   MRN: [id number]  HPI Pt states of moderate pain at the right little finger, but no assoc numbness. Past Medical History  Diagnosis Date  . Hyperlipidemia   . Hypertension   . Psoriasis   . Varicose veins   . ED (erectile dysfunction)   . Hypothyroidism   . Vitamin D deficiency     Past Surgical History  Procedure Laterality Date  . Rotator cuff repair, right shoulder per dr. Tonita Long  1996  . Torn right bicepts muscle  1996  . Total hip arthroplasty  01/17/10    redone on right hip on 09/05/10  . Joint replacement    . Joint replacement rt and left      History   Social History  . Marital Status: Widowed    Spouse Name: N/A    Number of Children: N/A  . Years of Education: N/A   Occupational History  . Not on file.   Social History Main Topics  . Smoking status: Former Research scientist (life sciences)  . Smokeless tobacco: Never Used  . Alcohol Use: No  . Drug Use: No  . Sexual Activity: Yes   Other Topics Concern  . Not on file   Social History Narrative  . No narrative on file    Current Outpatient Prescriptions on File Prior to Visit  Medication Sig Dispense Refill  . CRESTOR 5 MG tablet TAKE 1 TABLET DAILY  30 tablet  0  . fenofibrate 160 MG tablet TAKE 1 TABLET DAILY  90 tablet  0  . hydrochlorothiazide (HYDRODIURIL) 25 MG tablet TAKE 1 TABLET DAILY  90 tablet  1  . metoprolol tartrate (LOPRESSOR) 25 MG tablet TAKE 1 TABLET TWICE A DAY  180 tablet  0  . omega-3 acid ethyl esters (LOVAZA) 1 G capsule Take 2 capsules (2 g total) by mouth 2 (two) times daily.  120 capsule  11  . aspirin 325 MG tablet Take 325 mg by mouth daily.      . clobetasol (TEMOVATE) 0.05 % ointment Apply topically 2 (two) times daily.        . Diclofenac-Misoprostol 75-0.2 MG TBEC TAKE 1 TABLET TWO TIMES DAILY  3 tablet  2  . niacin 500 MG tablet Take 500 mg by mouth daily.       No current facility-administered medications on  file prior to visit.    No Known Allergies  Family History  Problem Relation Age of Onset  . Breast cancer      BP 124/80  Pulse 74  Temp(Src) 98.1 F (36.7 C) (Oral)  Ht 5' 10.5" (1.791 m)  Wt 190 lb (86.183 kg)  BMI 26.87 kg/m2  SpO2 95%  Review of Systems He has slight doe (quit smoking 1980).  Denies chest pain.     Objective:   Physical Exam VITAL SIGNS:  See vs page GENERAL: no distress.   LUNGS:  Clear to auscultation Right little finger: the PIP is moderately swollen, but no erythema/tenderness/warmth.    i reviewed electrocardiogram: no significant change since 2013.     Assessment & Plan:  Finger pain, new, uncertain etiology Restrictive lung dz, uncertain etiology.  We'll follow.   HTN: well-controlled.    Subjective:   Patient here for Medicare annual wellness visit and management of other chronic and acute problems.     Risk factors: advanced age    Administrator, arts of Physicians Providing Medical  Care to Patient:  See "snapshot"   Activities of Daily Living: In your present state of health, do you have any difficulty performing the following activities?:  Preparing food and eating?: No  Bathing yourself: No  Getting dressed: No  Using the toilet:No  Moving around from place to place: No  In the past year have you fallen or had a near fall?: No    Home Safety: Has smoke detector and wears seat belts. Firearms are safely stored. No excess sun exposure.   Diet and Exercise  Current exercise habits: pt says good Dietary issues discussed: pt reports a healthy diet   Depression Screen  Q1: Over the past two weeks, have you felt down, depressed or hopeless? no  Q2: Over the past two weeks, have you felt little interest or pleasure in doing things? no   The following portions of the patient's history were reviewed and updated as appropriate: allergies, current medications, past family history, past medical history, past social history, past surgical  history and problem list.  Past Medical History  Diagnosis Date  . Hyperlipidemia   . Hypertension   . Psoriasis   . Varicose veins   . ED (erectile dysfunction)   . Hypothyroidism   . Vitamin D deficiency     Past Surgical History  Procedure Laterality Date  . Rotator cuff repair, right shoulder per dr. Tonita Long  1996  . Torn right bicepts muscle  1996  . Total hip arthroplasty  01/17/10    redone on right hip on 09/05/10  . Joint replacement    . Joint replacement rt and left      History   Social History  . Marital Status: Married    Spouse Name: N/A    Number of Children: N/A  . Years of Education: N/A   Occupational History  . Not on file.   Social History Main Topics  . Smoking status: Former Research scientist (life sciences)  . Smokeless tobacco: Never Used  . Alcohol Use: No  . Drug Use: No  . Sexual Activity: Yes   Other Topics Concern  . Not on file   Social History Narrative  . No narrative on file    Current Outpatient Prescriptions on File Prior to Visit  Medication Sig Dispense Refill  . CRESTOR 5 MG tablet TAKE 1 TABLET DAILY  30 tablet  0  . fenofibrate 160 MG tablet TAKE 1 TABLET DAILY  90 tablet  0  . hydrochlorothiazide (HYDRODIURIL) 25 MG tablet TAKE 1 TABLET DAILY  90 tablet  1  . metoprolol tartrate (LOPRESSOR) 25 MG tablet TAKE 1 TABLET TWICE A DAY  180 tablet  0  . omega-3 acid ethyl esters (LOVAZA) 1 G capsule Take 2 capsules (2 g total) by mouth 2 (two) times daily.  120 capsule  11  . aspirin 325 MG tablet Take 325 mg by mouth daily.      . clobetasol (TEMOVATE) 0.05 % ointment Apply topically 2 (two) times daily.        . Diclofenac-Misoprostol 75-0.2 MG TBEC TAKE 1 TABLET TWO TIMES DAILY  3 tablet  2  . niacin 500 MG tablet Take 500 mg by mouth daily.       No current facility-administered medications on file prior to visit.    No Known Allergies  Family History  Problem Relation Age of Onset  . Breast cancer      BP 124/80  Pulse 74  Temp(Src) 98.1  F (36.7 C) (Oral)  Ht  5' 10.5" (1.791 m)  Wt 190 lb (86.183 kg)  BMI 26.87 kg/m2  SpO2 95%   Review of Systems  Denies hearing loss, and visual loss Objective:   Vision:  Sees opthalmologist Hearing: grossly normal Body mass index:  See vs page Msk: pt easily and quickly performs "get-up-and-go" from a sitting position Cognitive Impairment Assessment: cognition, memory and judgment appear normal.  remembers 3/3 at 5 minutes.  excellent recall.  can easily read and write a sentence.  alert and oriented x 3   Assessment:   Medicare wellness utd on preventive parameters    Plan:   During the course of the visit the patient was educated and counseled about appropriate screening and preventive services including:        Fall prevention   Diabetes screening  Nutrition counseling   Vaccines / LABS Zostavax / Pneumococcal Vaccine  Today.   PSA  Patient Instructions (the written plan) was given to the patient.   we discussed code status.  pt requests full code, but would not want to be started or maintained on artificial life-support measures if there was not a reasonable chance of recovery

## 2013-12-15 NOTE — Patient Instructions (Addendum)
blood tests, and a chest-x-ray, are being requested for you today.  We'll contact you with results. please consider these measures for your health:  minimize alcohol.  do not use tobacco products.  have a colonoscopy at least every 10 years from age 80.   keep firearms safely stored.  always use seat belts.  have working smoke alarms in your home.  see an eye doctor and dentist regularly.  never drive under the influence of alcohol or drugs (including prescription drugs).  those with fair skin should take precautions against the sun. good diet and exercise habits significanly improve your health.  please let me know if you wish to be referred to a dietician.  high blood sugar is very risky to your health.  you should see an eye doctor every year.  You are at higher than average risk for pneumonia and hepatitis-B.  You should be vaccinated against both.   Please return in 1 year.  You should have a vaccine against shingles (a painful rash which results from the  chickenpox infection which most people had many years ago).  This vaccine reduces, but does not totally eliminate the risk of shingles.  Because this is a medicare part d benefit, you should get it at a pharmacy.

## 2013-12-16 ENCOUNTER — Ambulatory Visit
Admission: RE | Admit: 2013-12-16 | Discharge: 2013-12-16 | Disposition: A | Discharge status: Home / Self Care | Payer: Medicare Other | Source: Ambulatory Visit | Attending: Endocrinology | Admitting: Endocrinology

## 2013-12-16 DIAGNOSIS — R0602 Shortness of breath: Secondary | ICD-10-CM | POA: Diagnosis not present

## 2013-12-16 DIAGNOSIS — R0600 Dyspnea, unspecified: Secondary | ICD-10-CM

## 2013-12-16 DIAGNOSIS — M79641 Pain in right hand: Secondary | ICD-10-CM

## 2013-12-16 DIAGNOSIS — Z87891 Personal history of nicotine dependence: Secondary | ICD-10-CM | POA: Diagnosis not present

## 2013-12-16 DIAGNOSIS — M19049 Primary osteoarthritis, unspecified hand: Secondary | ICD-10-CM | POA: Diagnosis not present

## 2013-12-16 LAB — CBC WITH DIFFERENTIAL/PLATELET
Basophils Absolute: 0 10*3/uL (ref 0.0–0.1)
Basophils Relative: 0.4 % (ref 0.0–3.0)
EOS PCT: 9.1 % — AB (ref 0.0–5.0)
Eosinophils Absolute: 0.8 10*3/uL — ABNORMAL HIGH (ref 0.0–0.7)
HCT: 44.4 % (ref 39.0–52.0)
Hemoglobin: 14.8 g/dL (ref 13.0–17.0)
Lymphocytes Relative: 27.6 % (ref 12.0–46.0)
Lymphs Abs: 2.4 10*3/uL (ref 0.7–4.0)
MCHC: 33.2 g/dL (ref 30.0–36.0)
MCV: 93.4 fl (ref 78.0–100.0)
MONOS PCT: 3.6 % (ref 3.0–12.0)
Monocytes Absolute: 0.3 10*3/uL (ref 0.1–1.0)
NEUTROS PCT: 59.3 % (ref 43.0–77.0)
Neutro Abs: 5.3 10*3/uL (ref 1.4–7.7)
Platelets: 225 10*3/uL (ref 150.0–400.0)
RBC: 4.75 Mil/uL (ref 4.22–5.81)
RDW: 12.9 % (ref 11.5–15.5)
WBC: 8.9 10*3/uL (ref 4.0–10.5)

## 2013-12-16 LAB — URINALYSIS, ROUTINE W REFLEX MICROSCOPIC
Bilirubin Urine: NEGATIVE
Hgb urine dipstick: NEGATIVE
Ketones, ur: NEGATIVE
Leukocytes, UA: NEGATIVE
NITRITE: NEGATIVE
Specific Gravity, Urine: >=1.03 — AB (ref 1.000–1.030)
Urine Glucose: NEGATIVE
Urobilinogen, UA: 0.2 (ref 0.0–1.0)
pH: 6 (ref 5.0–8.0)

## 2013-12-16 LAB — LIPID PANEL
Cholesterol: 213 mg/dL — ABNORMAL HIGH (ref 0–200)
HDL: 31.2 mg/dL — ABNORMAL LOW (ref >39.00)
LDL Cholesterol: 120 mg/dL — ABNORMAL HIGH (ref 0–99)
Total CHOL/HDL Ratio: 7
Triglycerides: 307 mg/dL — ABNORMAL HIGH (ref 0.0–149.0)
VLDL: 61.4 mg/dL — AB (ref 0.0–40.0)

## 2013-12-16 LAB — TSH: TSH: 1.37 u[IU]/mL (ref 0.35–4.50)

## 2013-12-16 LAB — BASIC METABOLIC PANEL
BUN: 42 mg/dL — ABNORMAL HIGH (ref 6–23)
CO2: 24 meq/L (ref 19–32)
Calcium: 9.9 mg/dL (ref 8.4–10.5)
Chloride: 104 mEq/L (ref 96–112)
Creatinine, Ser: 2.2 mg/dL — ABNORMAL HIGH (ref 0.4–1.5)
GFR: 30.21 mL/min — AB (ref >60.00)
Glucose, Bld: 136 mg/dL — ABNORMAL HIGH (ref 70–99)
POTASSIUM: 3.9 meq/L (ref 3.5–5.1)
SODIUM: 137 meq/L (ref 135–145)

## 2013-12-16 LAB — HEPATIC FUNCTION PANEL
ALK PHOS: 47 U/L (ref 39–117)
ALT: 16 U/L (ref 0–53)
AST: 26 U/L (ref 0–37)
Albumin: 3.9 g/dL (ref 3.5–5.2)
Bilirubin, Direct: 0.2 mg/dL (ref 0.0–0.3)
Total Bilirubin: 1 mg/dL (ref 0.2–1.2)
Total Protein: 7.1 g/dL (ref 6.0–8.3)

## 2013-12-16 LAB — PTH, INTACT AND CALCIUM
CALCIUM: 9.9 mg/dL (ref 8.4–10.5)
PTH: 36.2 pg/mL (ref 14.0–72.0)

## 2013-12-16 LAB — PSA, MEDICARE: PSA: 3.82 ng/mL (ref 0.10–4.00)

## 2013-12-16 LAB — HEMOGLOBIN A1C: Hgb A1c MFr Bld: 6.5 % (ref 4.6–6.5)

## 2013-12-16 LAB — URIC ACID: Uric Acid, Serum: 6.7 mg/dL (ref 4.0–7.8)

## 2013-12-16 IMAGING — CR DG CHEST 2V
2 series · 2 of 2 positions shown · non-contrast
Comparison: Chest x-ray of [DATE]

CLINICAL DATA: Shortness of breath, former smoking history,
hypertension

EXAM:
CHEST  2 VIEW

[w chest pa]
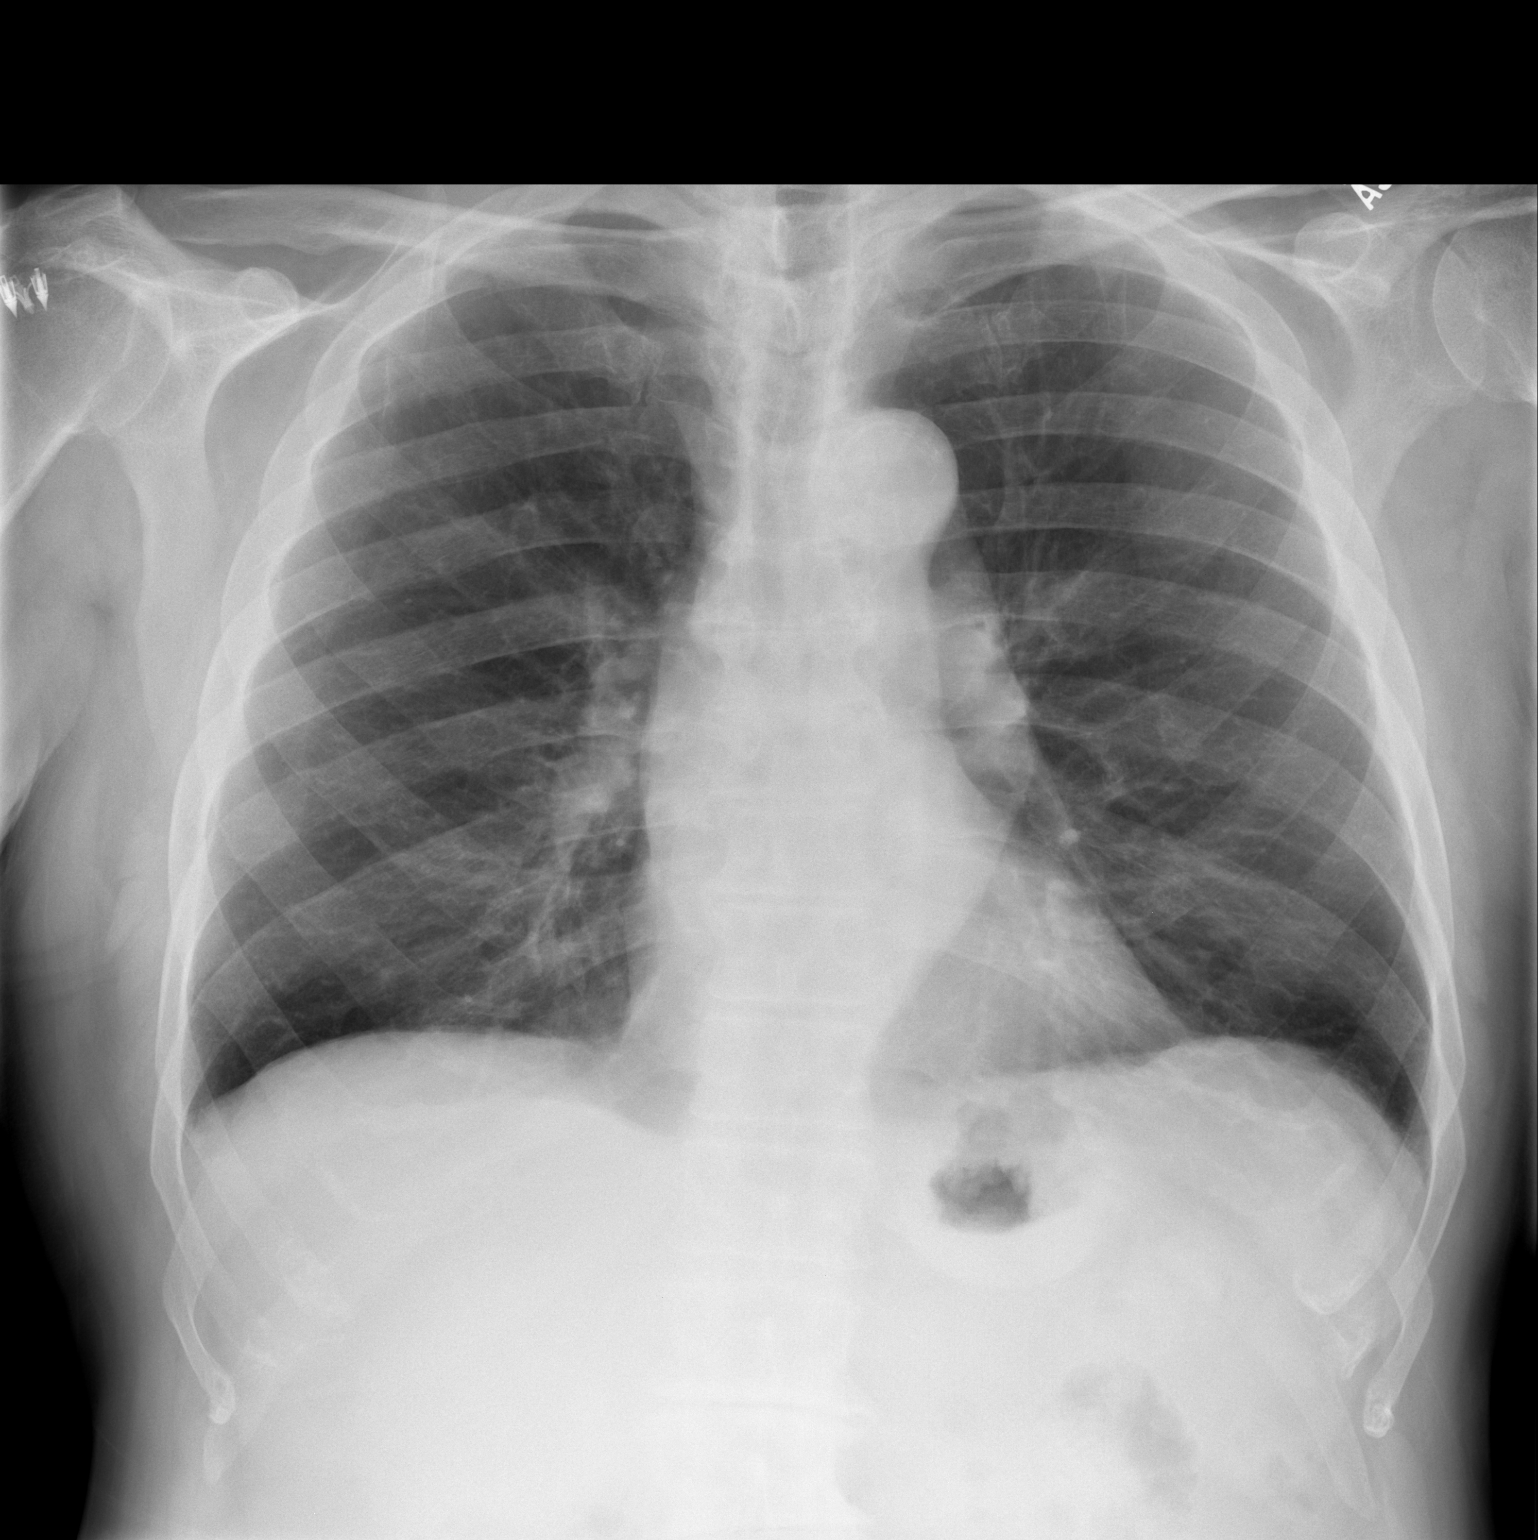

[w chest lat]
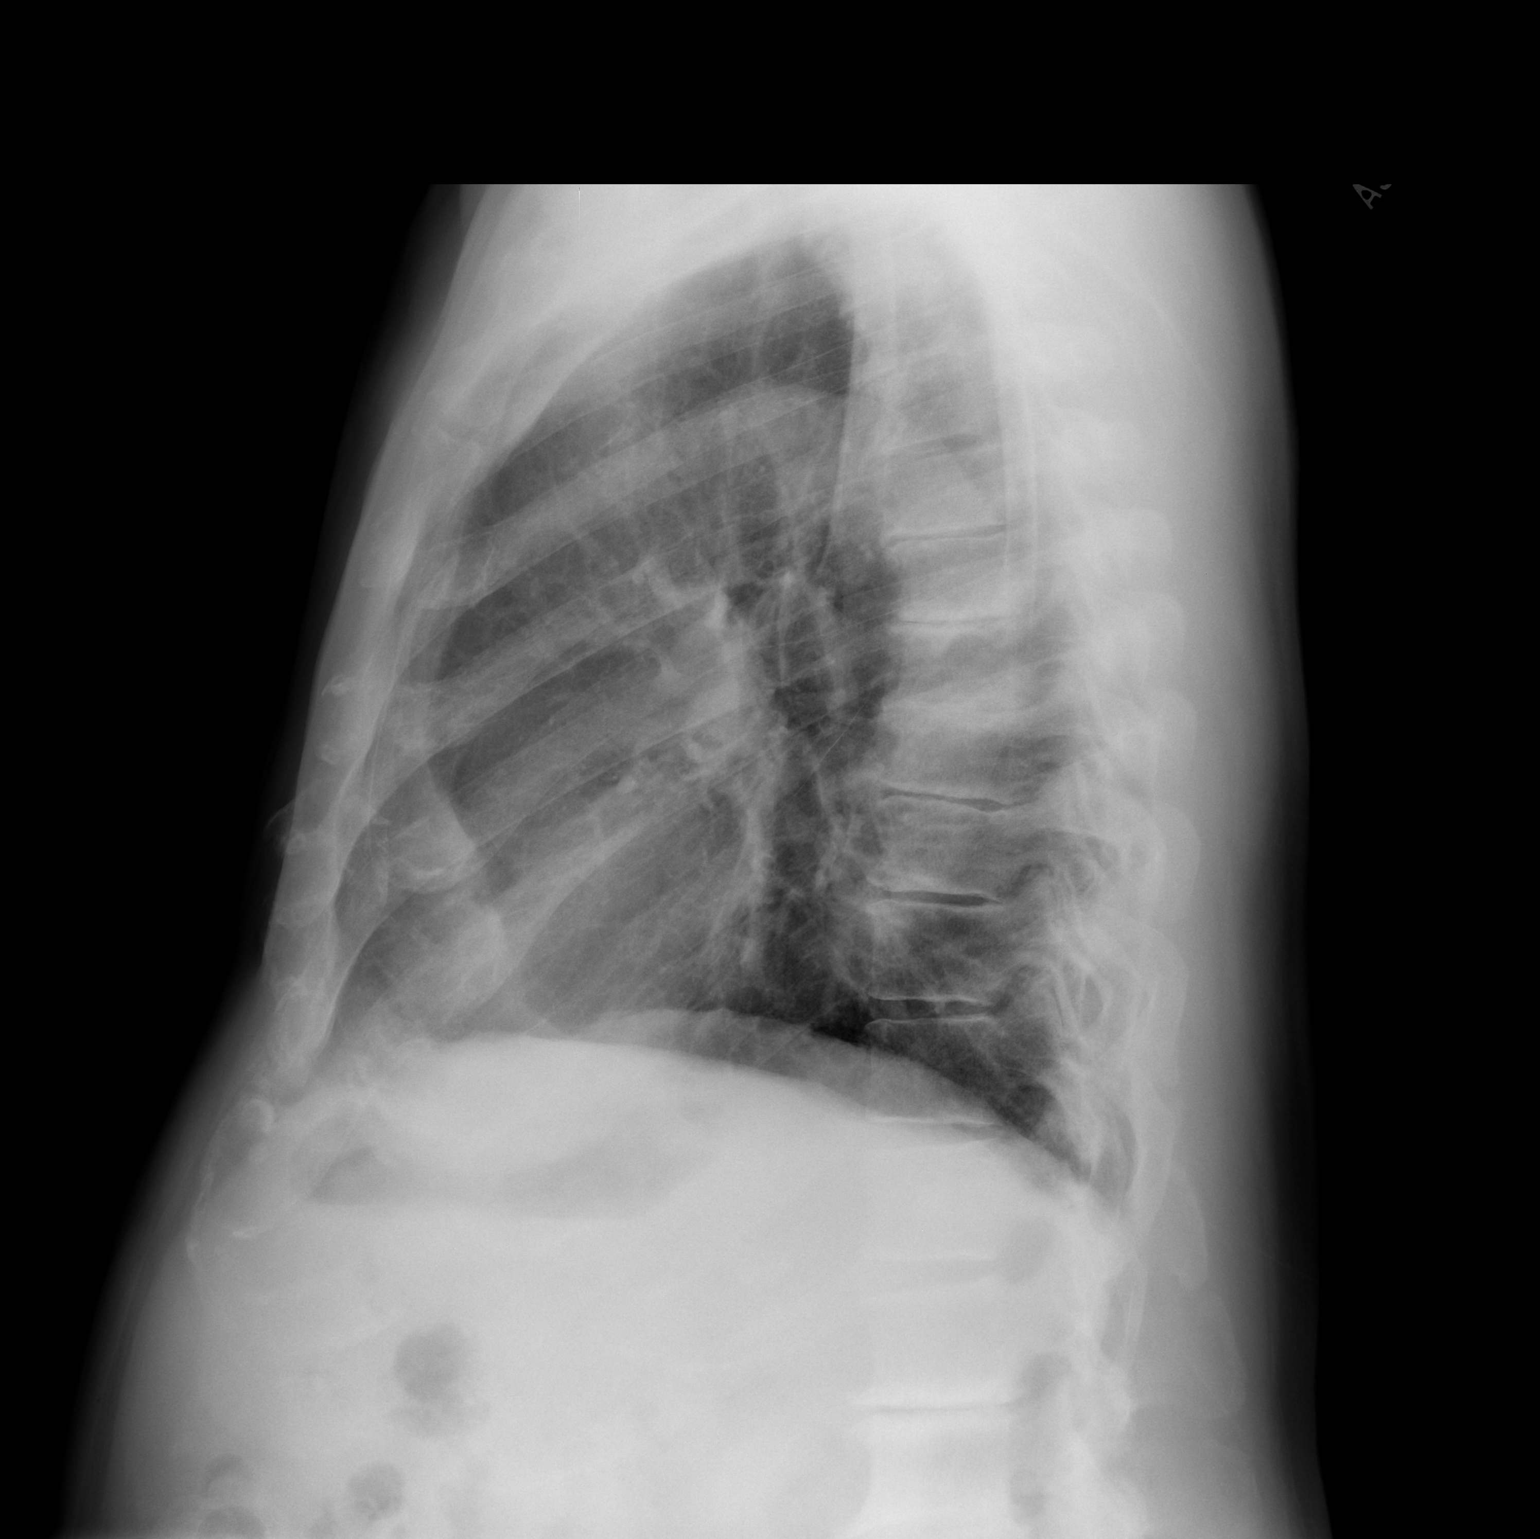

[2 of 2 positions shown; findings below may reference images not displayed]

FINDINGS: No active infiltrate or effusion is seen. Mediastinal contours
appear normal. The descending thoracic aorta remains ectatic. The
heart is within normal limits in size. No acute bony abnormality is
seen with diffuse degenerative change throughout the thoracic spine.
IMPRESSION: No active cardiopulmonary disease.

## 2013-12-16 IMAGING — CR DG HAND COMPLETE 3+V*R*
3 series · 3 of 3 positions shown · non-contrast
Comparison: None.

CLINICAL DATA: Hand pain

EXAM:
RIGHT HAND - COMPLETE 3+ VIEW

[x hand pa right]
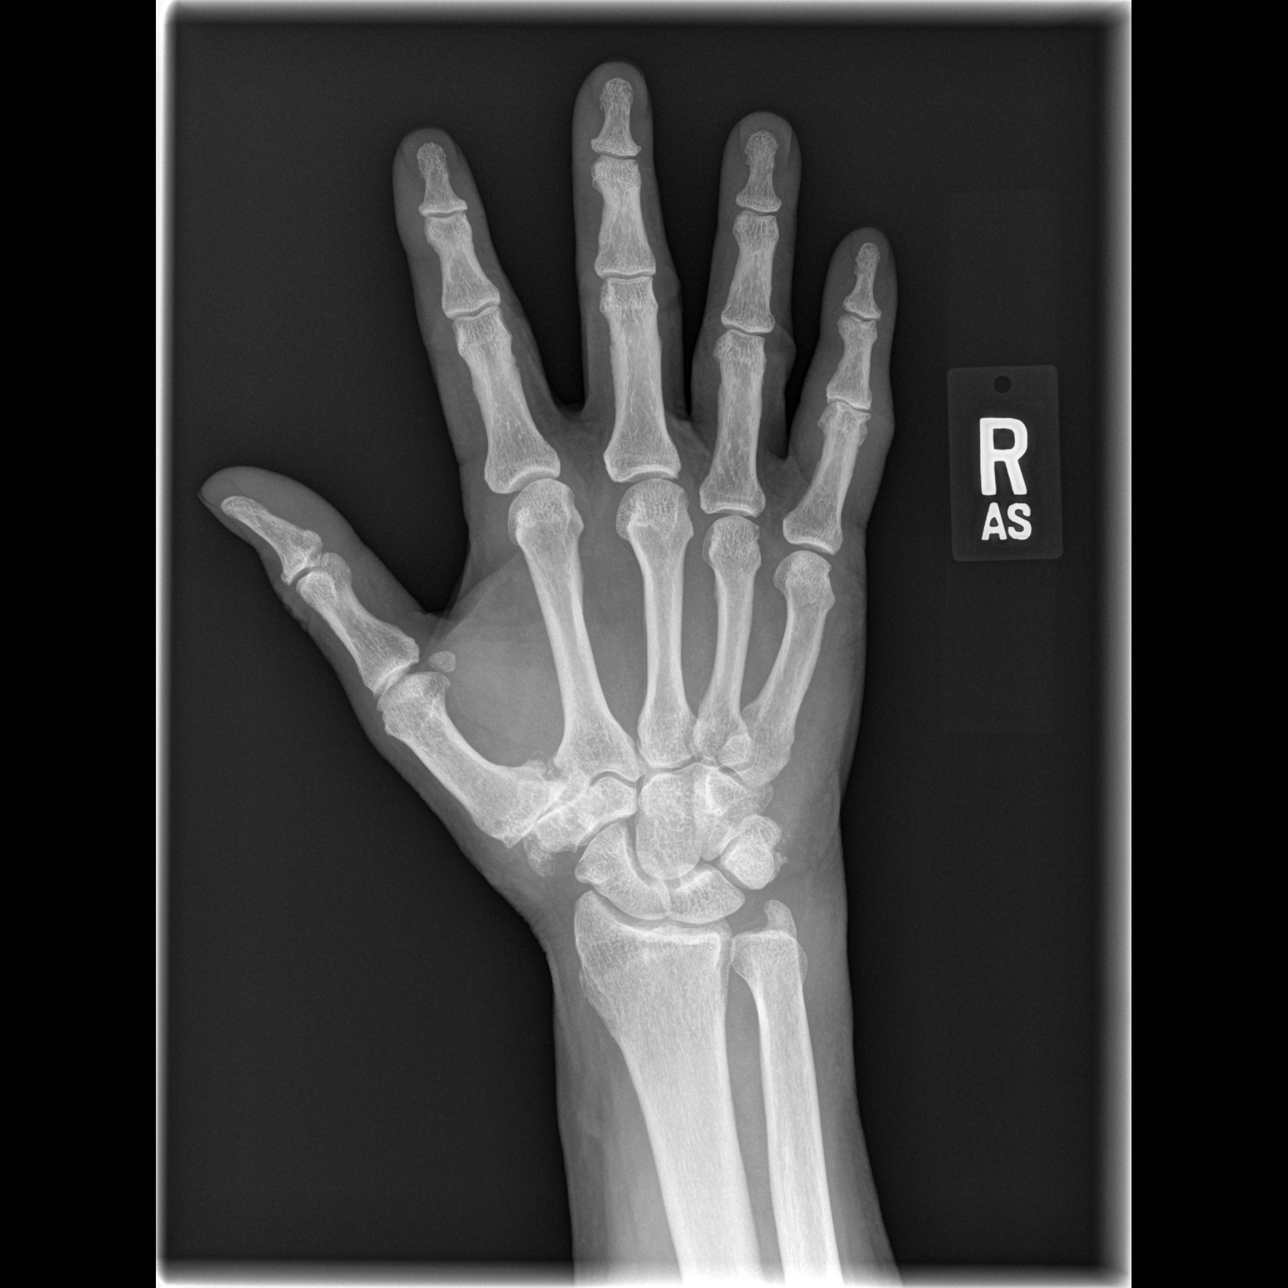

[x hand oblique right]
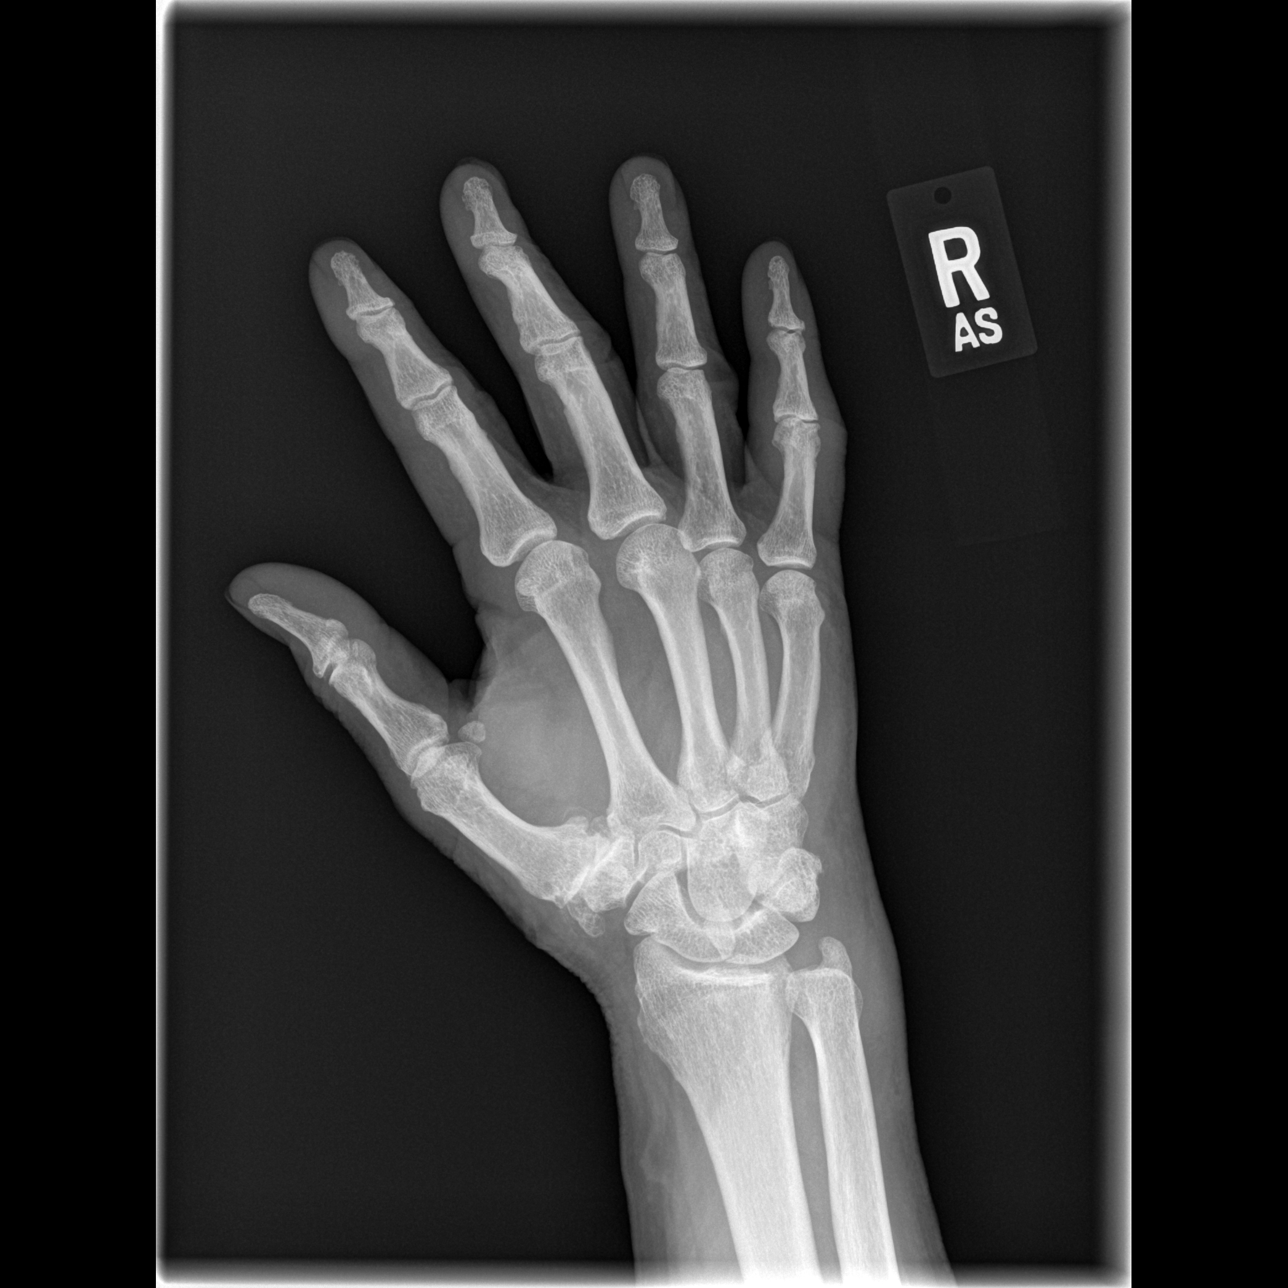

[x hand lat right]
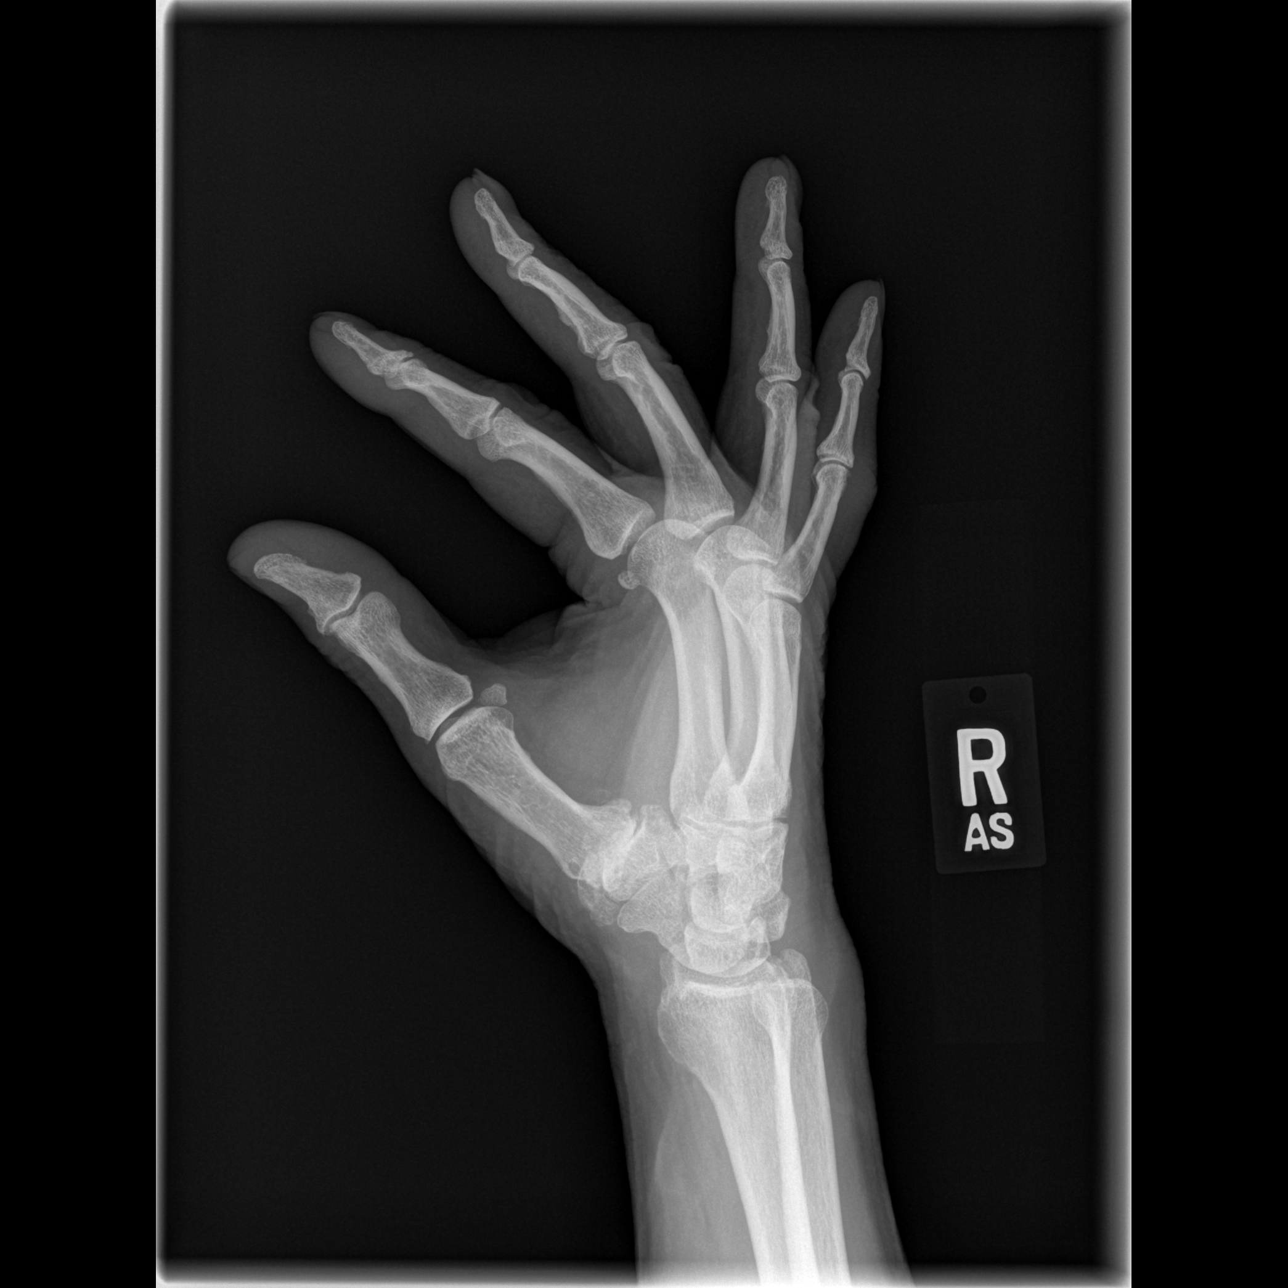

[3 of 3 positions shown; findings below may reference images not displayed]

FINDINGS: The radiocarpal joint space appears normal. The carpal bones are in
normal position. There is considerable degenerative change at the
articulation of the base of the first metacarpal with the trapezium
where there is loss of joint space, sclerosis, and spur formation.
There is mild degenerative change involving the DIP joints as well
as the right fifth PIP joint where there is soft tissue swelling
present. No erosion is seen.
IMPRESSION: Degenerative change involving the articulation of the base of the
first metacarpal with the trapezium as well as the DIP joints and
the right fifth PIP joint. No erosion.

## 2013-12-17 ENCOUNTER — Other Ambulatory Visit: Payer: Self-pay | Admitting: Endocrinology

## 2013-12-17 DIAGNOSIS — N289 Disorder of kidney and ureter, unspecified: Secondary | ICD-10-CM

## 2013-12-17 LAB — PTH, INTACT AND CALCIUM
Calcium: 9.8 mg/dL (ref 8.4–10.5)
PTH: 33.8 pg/mL (ref 14.0–72.0)

## 2013-12-19 ENCOUNTER — Encounter: Payer: Self-pay | Admitting: *Deleted

## 2013-12-19 ENCOUNTER — Telehealth: Payer: Self-pay

## 2013-12-19 MED ORDER — ROSUVASTATIN CALCIUM 20 MG PO TABS: 20.0000 mg | ORAL_TABLET | Freq: Every day | ORAL | Status: DC

## 2013-12-19 NOTE — Telephone Encounter (Signed)
Please increase crestor to 20 mg qd.  i have sent a prescription to your pharmacy

## 2013-12-19 NOTE — Telephone Encounter (Signed)
Pt called back stating that he does take the Crestor everyday as prescribed.  Please advise, Thanks!

## 2013-12-19 NOTE — Telephone Encounter (Signed)
Pt advised via voicemail. 

## 2013-12-23 ENCOUNTER — Telehealth: Payer: Self-pay | Admitting: Endocrinology

## 2013-12-23 NOTE — Telephone Encounter (Signed)
please call patient: Ins wants to change crestor to lipitor: Ok to change?

## 2013-12-23 NOTE — Telephone Encounter (Signed)
Requested call back to discuss.  

## 2013-12-24 MED ORDER — FENOFIBRATE 48 MG PO TABS: 48.0000 mg | ORAL_TABLET | Freq: Every day | ORAL | Status: DC

## 2013-12-24 MED ORDER — ATORVASTATIN CALCIUM 80 MG PO TABS: 80.0000 mg | ORAL_TABLET | Freq: Every day | ORAL | Status: DC

## 2013-12-24 NOTE — Telephone Encounter (Signed)
Called pt and he states that he is ok with changing to lipitor.

## 2013-12-24 NOTE — Telephone Encounter (Signed)
Noted, Pt advised and voiced understanding.

## 2013-12-24 NOTE — Telephone Encounter (Signed)
Ok, i have sent a prescription to your pharmacy Due to an interaction, you should reduce the fenofibrate.  i have sent a prescription to your pharmacy for this, too.

## 2013-12-24 NOTE — Telephone Encounter (Signed)
Requested call back to discuss.  

## 2014-01-14 DIAGNOSIS — H02059 Trichiasis without entropian unspecified eye, unspecified eyelid: Secondary | ICD-10-CM | POA: Diagnosis not present

## 2014-01-14 DIAGNOSIS — S058X9A Other injuries of unspecified eye and orbit, initial encounter: Secondary | ICD-10-CM | POA: Diagnosis not present

## 2014-01-23 ENCOUNTER — Other Ambulatory Visit: Payer: Self-pay | Admitting: Endocrinology

## 2014-02-16 ENCOUNTER — Other Ambulatory Visit: Payer: Self-pay | Admitting: Endocrinology

## 2014-04-29 ENCOUNTER — Other Ambulatory Visit: Payer: Self-pay | Admitting: Endocrinology

## 2014-05-08 DIAGNOSIS — N183 Chronic kidney disease, stage 3 (moderate): Secondary | ICD-10-CM | POA: Diagnosis not present

## 2014-05-08 DIAGNOSIS — D631 Anemia in chronic kidney disease: Secondary | ICD-10-CM | POA: Diagnosis not present

## 2014-05-08 DIAGNOSIS — N2581 Secondary hyperparathyroidism of renal origin: Secondary | ICD-10-CM | POA: Diagnosis not present

## 2014-05-08 DIAGNOSIS — N189 Chronic kidney disease, unspecified: Secondary | ICD-10-CM | POA: Diagnosis not present

## 2014-05-08 DIAGNOSIS — I129 Hypertensive chronic kidney disease with stage 1 through stage 4 chronic kidney disease, or unspecified chronic kidney disease: Secondary | ICD-10-CM | POA: Diagnosis not present

## 2014-05-13 ENCOUNTER — Other Ambulatory Visit: Payer: Self-pay | Admitting: Nephrology

## 2014-05-13 DIAGNOSIS — N183 Chronic kidney disease, stage 3 unspecified: Secondary | ICD-10-CM

## 2014-05-14 ENCOUNTER — Ambulatory Visit
Admission: RE | Admit: 2014-05-14 | Discharge: 2014-05-14 | Disposition: A | Discharge status: Home / Self Care | Payer: Medicare Other | Source: Ambulatory Visit | Attending: Nephrology | Admitting: Nephrology

## 2014-05-14 DIAGNOSIS — I10 Essential (primary) hypertension: Secondary | ICD-10-CM | POA: Diagnosis not present

## 2014-05-14 DIAGNOSIS — N400 Enlarged prostate without lower urinary tract symptoms: Secondary | ICD-10-CM | POA: Diagnosis not present

## 2014-05-14 DIAGNOSIS — N183 Chronic kidney disease, stage 3 unspecified: Secondary | ICD-10-CM

## 2014-05-14 IMAGING — US US RENAL
1 series · 14 of 25 positions shown · non-contrast
Comparison: None

CLINICAL DATA: Chronic renal disease stage III, personal history of
hypertension

EXAM:
RENAL/URINARY TRACT ULTRASOUND COMPLETE

[Series 1: us renal · 0.26mm/px · 14 of 32 slices shown]
[im 1/32]
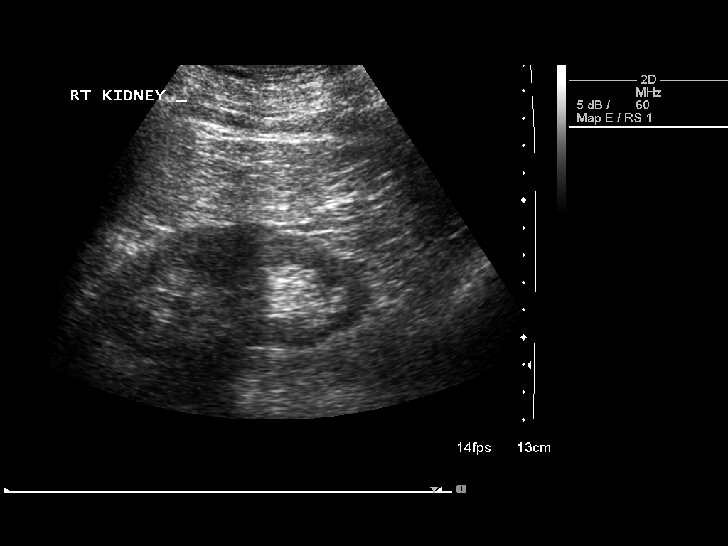
[im 3/32]
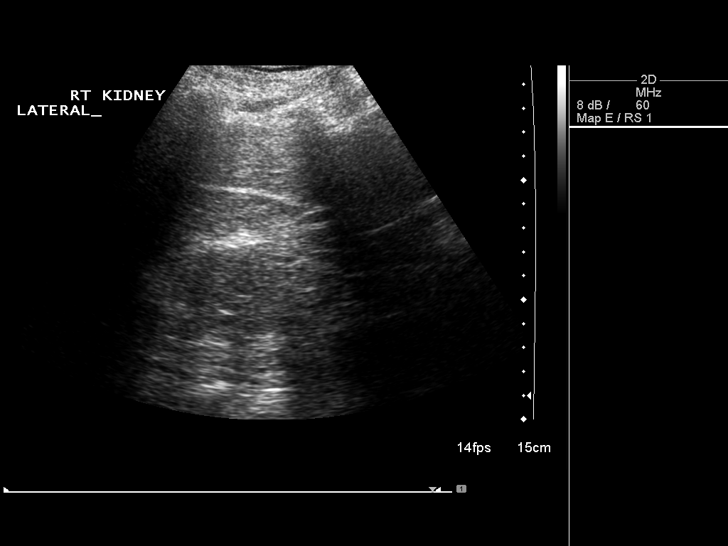
[im 6/32]
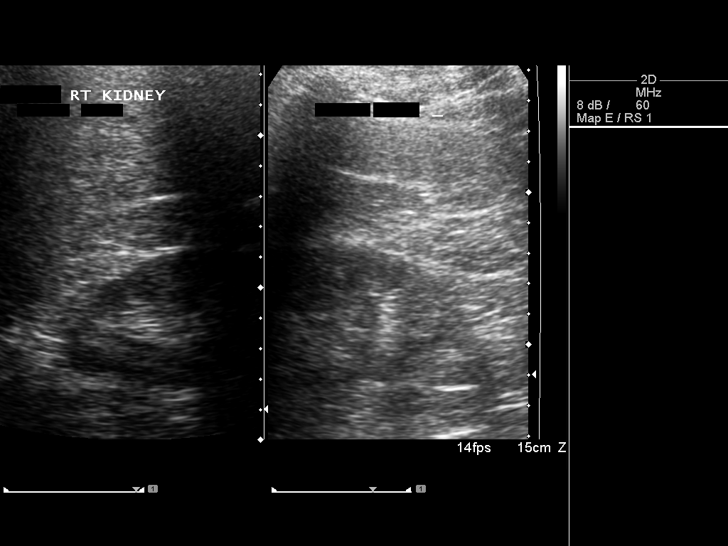
[im 8/32]
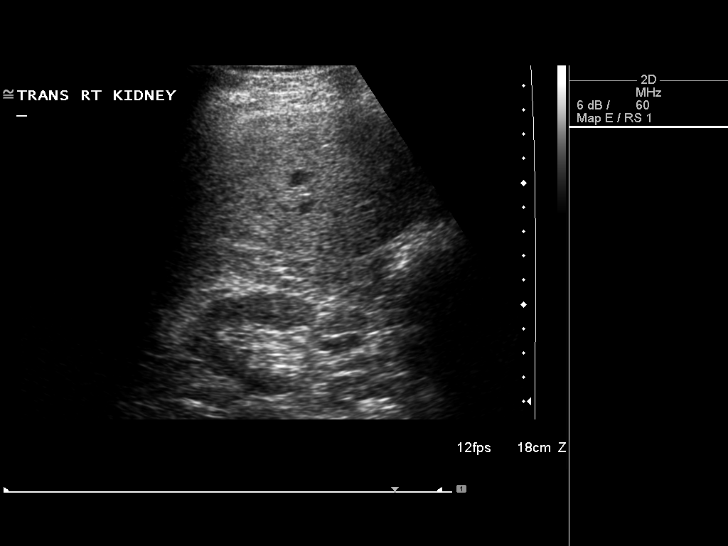
[im 11/32]
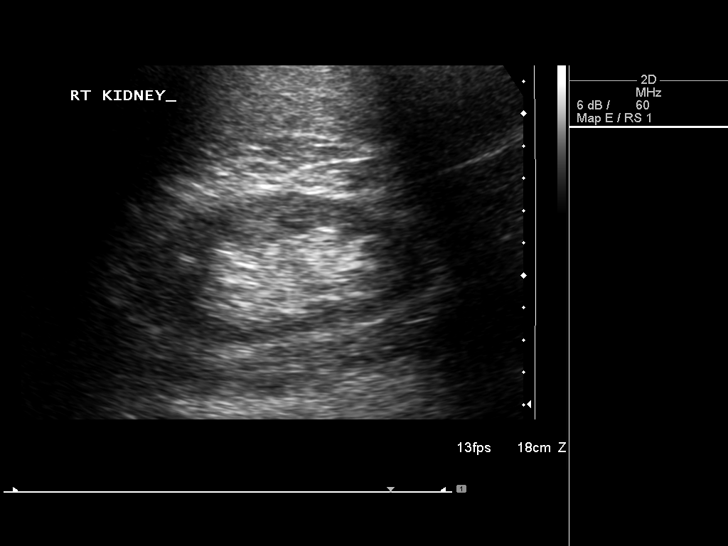
[im 12/32]
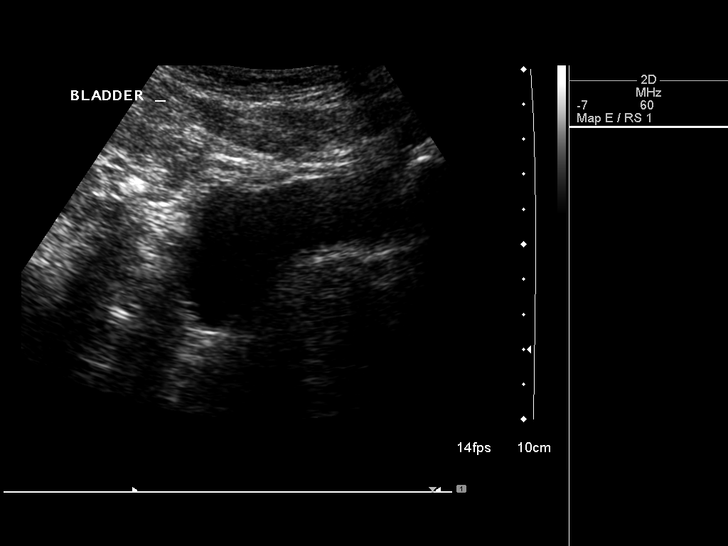
[im 15/32]
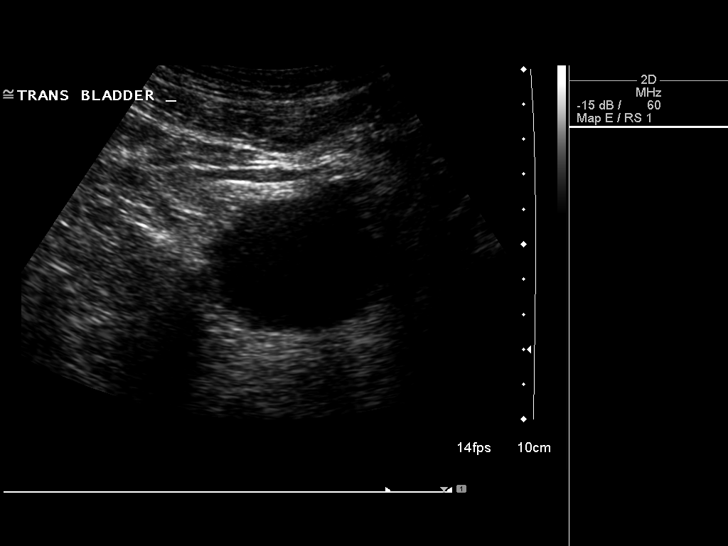
[im 17/32]
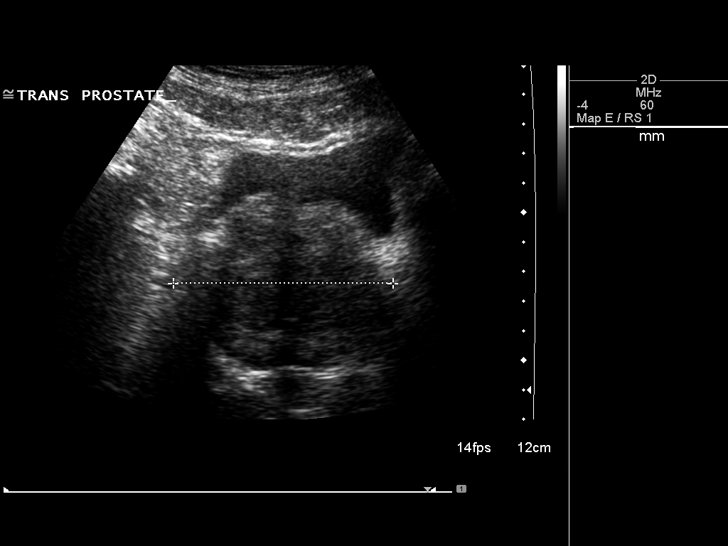
[im 20/32]
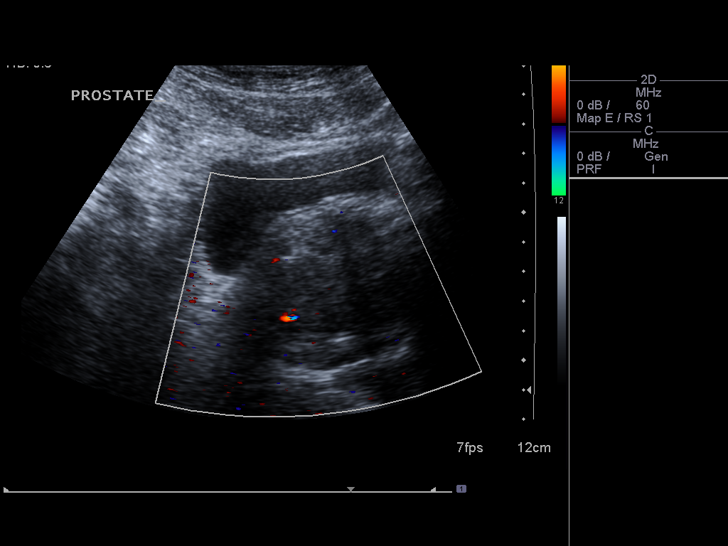
[im 21/32]
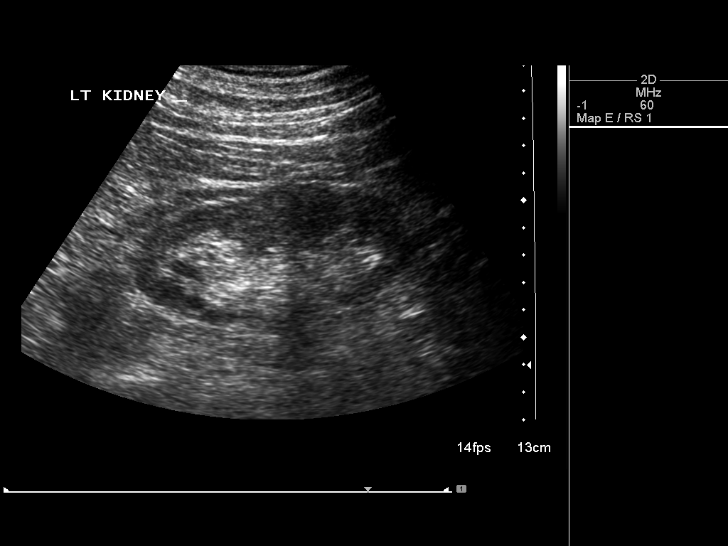
[im 24/32]
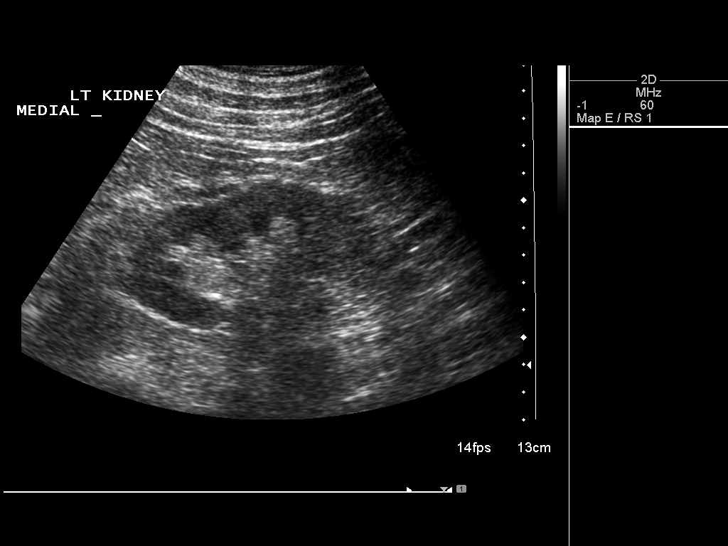
[im 26/32]
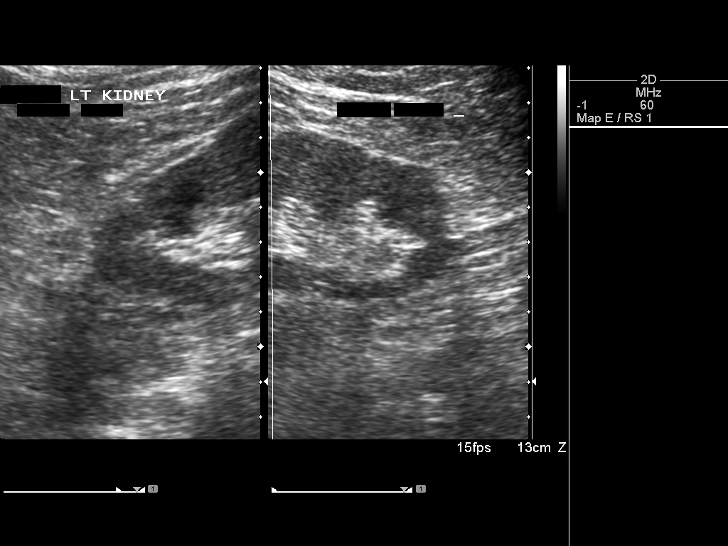
[im 29/32]
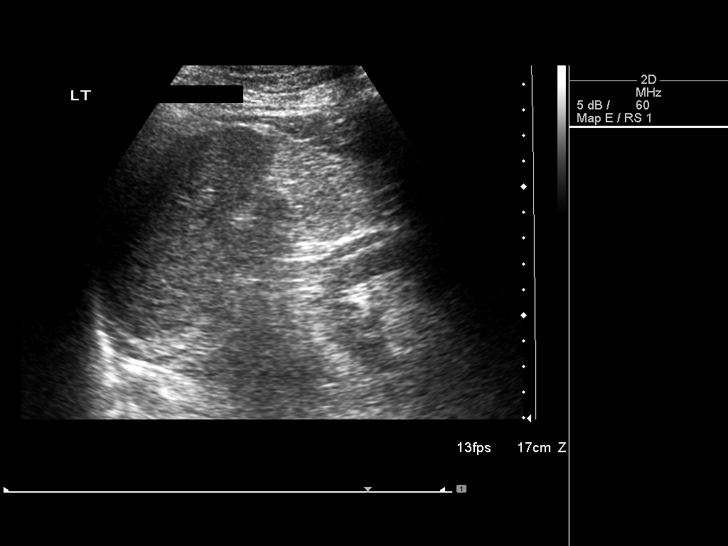
[im 32/32]
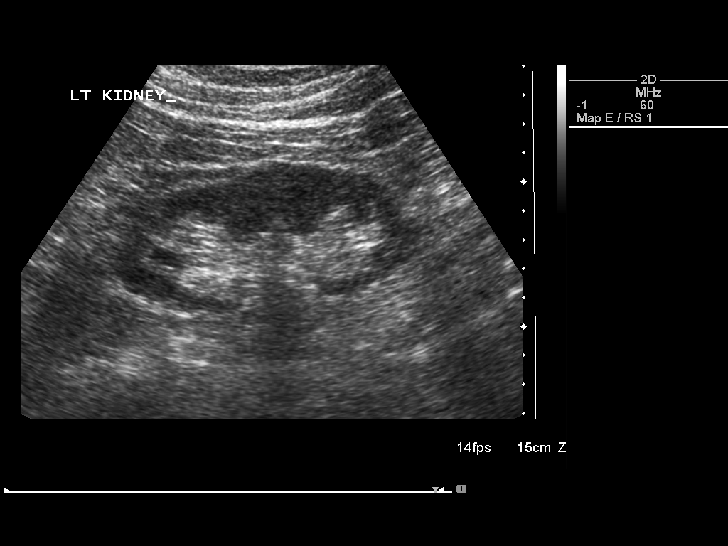

[14 of 25 positions shown; findings below may reference images not displayed]

FINDINGS: Right Kidney:

Length: 10.0 cm. Age-related cortical thinning. Normal cortical
echogenicity. No mass, hydronephrosis or shadowing calcification.

Left Kidney:

Length: 10.2 cm. Normal cortical echogenicity. Age-related renal
cortical thinning. No mass, hydronephrosis or shadowing
calcification.

Bladder:

Normal appearance.

Significant prostatic enlargement gland measuring 5.9 x 5.8 x
cm.
IMPRESSION: Sonographically unremarkable kidneys for age.

Significant prostatic enlargement.

## 2014-06-15 DIAGNOSIS — N2581 Secondary hyperparathyroidism of renal origin: Secondary | ICD-10-CM | POA: Diagnosis not present

## 2014-06-15 DIAGNOSIS — N183 Chronic kidney disease, stage 3 (moderate): Secondary | ICD-10-CM | POA: Diagnosis not present

## 2014-06-15 DIAGNOSIS — I129 Hypertensive chronic kidney disease with stage 1 through stage 4 chronic kidney disease, or unspecified chronic kidney disease: Secondary | ICD-10-CM | POA: Diagnosis not present

## 2014-06-15 DIAGNOSIS — D631 Anemia in chronic kidney disease: Secondary | ICD-10-CM | POA: Diagnosis not present

## 2014-06-16 ENCOUNTER — Other Ambulatory Visit: Payer: Self-pay | Admitting: Endocrinology

## 2014-08-27 ENCOUNTER — Other Ambulatory Visit: Payer: Self-pay | Admitting: Endocrinology

## 2014-09-25 DIAGNOSIS — H02051 Trichiasis without entropian right upper eyelid: Secondary | ICD-10-CM | POA: Diagnosis not present

## 2014-11-09 ENCOUNTER — Other Ambulatory Visit: Payer: Self-pay | Admitting: Endocrinology

## 2014-12-07 DIAGNOSIS — M179 Osteoarthritis of knee, unspecified: Secondary | ICD-10-CM | POA: Diagnosis not present

## 2014-12-16 ENCOUNTER — Ambulatory Visit (INDEPENDENT_AMBULATORY_CARE_PROVIDER_SITE_OTHER): Payer: Medicare Other | Admitting: Endocrinology

## 2014-12-16 ENCOUNTER — Encounter: Payer: Self-pay | Admitting: Endocrinology

## 2014-12-16 VITALS — BP 122/80 | HR 71 | Temp 97.6°F | Wt 183.0 lb

## 2014-12-16 DIAGNOSIS — R739 Hyperglycemia, unspecified: Secondary | ICD-10-CM | POA: Diagnosis not present

## 2014-12-16 DIAGNOSIS — E876 Hypokalemia: Secondary | ICD-10-CM | POA: Diagnosis not present

## 2014-12-16 DIAGNOSIS — Z125 Encounter for screening for malignant neoplasm of prostate: Secondary | ICD-10-CM

## 2014-12-16 DIAGNOSIS — N289 Disorder of kidney and ureter, unspecified: Secondary | ICD-10-CM

## 2014-12-16 DIAGNOSIS — E785 Hyperlipidemia, unspecified: Secondary | ICD-10-CM | POA: Diagnosis not present

## 2014-12-16 DIAGNOSIS — E559 Vitamin D deficiency, unspecified: Secondary | ICD-10-CM | POA: Diagnosis not present

## 2014-12-16 DIAGNOSIS — Z0000 Encounter for general adult medical examination without abnormal findings: Secondary | ICD-10-CM | POA: Diagnosis not present

## 2014-12-16 DIAGNOSIS — R972 Elevated prostate specific antigen [PSA]: Secondary | ICD-10-CM | POA: Insufficient documentation

## 2014-12-16 DIAGNOSIS — I10 Essential (primary) hypertension: Secondary | ICD-10-CM | POA: Diagnosis not present

## 2014-12-16 LAB — LIPID PANEL
Cholesterol: 147 mg/dL (ref 0–200)
HDL: 29 mg/dL — ABNORMAL LOW (ref >39.00)
NONHDL: 118
Total CHOL/HDL Ratio: 5
Triglycerides: 296 mg/dL — ABNORMAL HIGH (ref 0.0–149.0)
VLDL: 59.2 mg/dL — ABNORMAL HIGH (ref 0.0–40.0)

## 2014-12-16 LAB — HEPATIC FUNCTION PANEL
ALT: 18 U/L (ref 0–53)
AST: 19 U/L (ref 0–37)
Albumin: 3.9 g/dL (ref 3.5–5.2)
Alkaline Phosphatase: 87 U/L (ref 39–117)
Bilirubin, Direct: 0.2 mg/dL (ref 0.0–0.3)
Total Bilirubin: 1.3 mg/dL — ABNORMAL HIGH (ref 0.2–1.2)
Total Protein: 7.4 g/dL (ref 6.0–8.3)

## 2014-12-16 LAB — PSA, MEDICARE: PSA: 4.68 ng/ml — ABNORMAL HIGH (ref 0.10–4.00)

## 2014-12-16 LAB — LDL CHOLESTEROL, DIRECT: Direct LDL: 63 mg/dL

## 2014-12-16 LAB — BASIC METABOLIC PANEL
BUN: 33 mg/dL — ABNORMAL HIGH (ref 6–23)
CO2: 28 meq/L (ref 19–32)
CREATININE: 1.91 mg/dL — AB (ref 0.40–1.50)
Calcium: 9.6 mg/dL (ref 8.4–10.5)
Chloride: 99 mEq/L (ref 96–112)
GFR: 35.85 mL/min — ABNORMAL LOW (ref >60.00)
Glucose, Bld: 111 mg/dL — ABNORMAL HIGH (ref 70–99)
Potassium: 3.3 mEq/L — ABNORMAL LOW (ref 3.5–5.1)
Sodium: 134 mEq/L — ABNORMAL LOW (ref 135–145)

## 2014-12-16 LAB — CBC WITH DIFFERENTIAL/PLATELET
BASOS ABS: 0 10*3/uL (ref 0.0–0.1)
BASOS PCT: 0.5 % (ref 0.0–3.0)
Eosinophils Absolute: 0.7 10*3/uL (ref 0.0–0.7)
Eosinophils Relative: 9.1 % — ABNORMAL HIGH (ref 0.0–5.0)
HCT: 45.9 % (ref 39.0–52.0)
HEMOGLOBIN: 15.6 g/dL (ref 13.0–17.0)
LYMPHS ABS: 2.6 10*3/uL (ref 0.7–4.0)
LYMPHS PCT: 33.2 % (ref 12.0–46.0)
MCHC: 34.1 g/dL (ref 30.0–36.0)
MCV: 91.2 fl (ref 78.0–100.0)
MONOS PCT: 7 % (ref 3.0–12.0)
Monocytes Absolute: 0.5 10*3/uL (ref 0.1–1.0)
NEUTROS ABS: 3.9 10*3/uL (ref 1.4–7.7)
Neutrophils Relative %: 50.2 % (ref 43.0–77.0)
PLATELETS: 198 10*3/uL (ref 150.0–400.0)
RBC: 5.03 Mil/uL (ref 4.22–5.81)
RDW: 13.3 % (ref 11.5–15.5)
WBC: 7.8 10*3/uL (ref 4.0–10.5)

## 2014-12-16 LAB — URINALYSIS, ROUTINE W REFLEX MICROSCOPIC
Bilirubin Urine: NEGATIVE
HGB URINE DIPSTICK: NEGATIVE
Ketones, ur: NEGATIVE
Leukocytes, UA: NEGATIVE
Nitrite: NEGATIVE
RBC / HPF: NONE SEEN (ref 0–?)
Specific Gravity, Urine: 1.02 (ref 1.000–1.030)
Total Protein, Urine: NEGATIVE
URINE GLUCOSE: NEGATIVE
UROBILINOGEN UA: 1 (ref 0.0–1.0)
WBC, UA: NONE SEEN (ref 0–?)
pH: 6 (ref 5.0–8.0)

## 2014-12-16 LAB — HEMOGLOBIN A1C: HEMOGLOBIN A1C: 6.4 % (ref 4.6–6.5)

## 2014-12-16 LAB — TSH: TSH: 2.93 u[IU]/mL (ref 0.35–4.50)

## 2014-12-16 MED ORDER — DICLOFENAC SODIUM 1 % TD GEL: 4.0000 g | Freq: Four times a day (QID) | TRANSDERMAL | Status: DC

## 2014-12-16 MED ORDER — HYDROCHLOROTHIAZIDE 12.5 MG PO TABS: 12.5000 mg | ORAL_TABLET | Freq: Every day | ORAL | Status: DC

## 2014-12-16 NOTE — Patient Instructions (Signed)
please consider these measures for your health:  minimize alcohol.  do not use tobacco products.  have a colonoscopy at least every 10 years from age 81.  keep firearms safely stored.  always use seat belts.  have working smoke alarms in your home.  see an eye doctor and dentist regularly.  never drive under the influence of alcohol or drugs (including prescription drugs).  those with fair skin should take precautions against the sun. please let me know what your wishes would be, if artificial life support measures should become necessary.  it is critically important to prevent falling down (keep floor areas well-lit, dry, and free of loose objects.  If you have a cane, walker, or wheelchair, you should use it, even for short trips around the house.  Also, try not to rush). Please return in 1 year.

## 2014-12-16 NOTE — Progress Notes (Signed)
Subjective:    Patient ID: Brendan Long, male    DOB: Feb 17, 1929, 81 y.o.   MRN: [id number]  HPI The state of at least three ongoing medical problems is addressed today, with interval history of each noted here: DM: pt says he has lost weight, due to his efforts. HTN: denies sob Dyslipidemia: denies chest pain Past Medical History  Diagnosis Date  . Hyperlipidemia   . Hypertension   . Psoriasis   . Varicose veins   . ED (erectile dysfunction)   . Hypothyroidism   . Vitamin D deficiency     Past Surgical History  Procedure Laterality Date  . Rotator cuff repair, right shoulder per dr. Tonita Cong  1996  . Torn right bicepts muscle  1996  . Total hip arthroplasty  01/17/10    redone on right hip on 09/05/10  . Joint replacement    . Joint replacement rt and left      History   Social History  . Marital Status: Widowed    Spouse Name: N/A  . Number of Children: N/A  . Years of Education: N/A   Occupational History  . Not on file.   Social History Main Topics  . Smoking status: Former Research scientist (life sciences)  . Smokeless tobacco: Never Used  . Alcohol Use: No  . Drug Use: No  . Sexual Activity: Yes   Other Topics Concern  . Not on file   Social History Narrative    Current Outpatient Prescriptions on File Prior to Visit  Medication Sig Dispense Refill  . atorvastatin (LIPITOR) 80 MG tablet TAKE 1 TABLET DAILY 90 tablet 0  . fenofibrate (TRICOR) 48 MG tablet TAKE 1 TABLET DAILY 90 tablet 0  . metoprolol tartrate (LOPRESSOR) 25 MG tablet TAKE 1 TABLET TWICE A DAY 180 tablet 2  . aspirin 325 MG tablet Take 325 mg by mouth daily.    . clobetasol (TEMOVATE) 0.05 % ointment Apply topically 2 (two) times daily.      . Diclofenac-Misoprostol 75-0.2 MG TBEC TAKE 1 TABLET TWO TIMES DAILY (Patient not taking: Reported on 12/16/2014) 3 tablet 2  . niacin 500 MG tablet Take 500 mg by mouth daily.    Marland Kitchen omega-3 acid ethyl esters (LOVAZA) 1 G capsule Take 2 capsules (2 g total) by mouth 2  (two) times daily. 120 capsule 11   No current facility-administered medications on file prior to visit.    No Known Allergies  Family History  Problem Relation Age of Onset  . Breast cancer      BP 122/80 mmHg  Pulse 71  Temp(Src) 97.6 F (36.4 C)  Wt 183 lb (83.008 kg)  SpO2 93%  Review of Systems Denies edema and dizziness.    Objective:   Physical Exam VITAL SIGNS:  See vs page GENERAL: no distress LUNGS:  Clear to auscultation HEART:  Regular rate and rhythm without murmurs noted. Normal S1,S2.   Pulses: dorsalis pedis intact bilat.   MSK: no deformity of the feet CV: no leg edema Skin:  no ulcer on the feet.  normal color and temp on the feet. Neuro: sensation is intact to touch on the feet Ext: There is bilateral onychomycosis of the toenails   Lab Results  Component Value Date   WBC 7.8 12/16/2014   HGB 15.6 12/16/2014   HCT 45.9 12/16/2014   PLT 198.0 12/16/2014   GLUCOSE 111* 12/16/2014   CHOL 147 12/16/2014   TRIG 296.0* 12/16/2014   HDL 29.00* 12/16/2014  LDLDIRECT 63.0 12/16/2014   LDLCALC 120* 12/15/2013   ALT 18 12/16/2014   AST 19 12/16/2014   NA 134* 12/16/2014   K 3.3* 12/16/2014   CL 99 12/16/2014   CREATININE 1.91* 12/16/2014   BUN 33* 12/16/2014   CO2 28 12/16/2014   TSH 2.93 12/16/2014   PSA 4.68* 12/16/2014   HGBA1C 6.4 12/16/2014       Assessment & Plan:  Elevated PSA, new:  Dyslipidemia: he needs increased rx Renal insuff, worse Hypokalemia, new HTN: well-controlled  see a urology specialist. you will receive a phone call, about a day and time for an appointment. Please skip 1 week of HCTZ, then resume at half as much. i have sent a prescription to your pharmacy Please repeat blood test in 2-3 weeks.  Subjective:   Patient here for Medicare annual wellness visit and management of other chronic and acute problems.     Risk factors: advanced age    81 of Physicians Providing Medical Care to Patient:  See  "snapshot"   Activities of Daily Living: In your present state of health, do you have any difficulty performing the following activities?:  Preparing food and eating?: No  Bathing yourself: No  Getting dressed: No  Using the toilet:No  Moving around from place to place: No  In the past year have you fallen or had a near fall?:No    Home Safety: Has smoke detector and wears seat belts. Firearms are safely stored. No excess sun exposure.  Diet and Exercise  Current exercise habits: pt says good Dietary issues discussed: pt reports a healthy diet   Depression Screen  Q1: Over the past two weeks, have you felt down, depressed or hopeless?no  Q2: Over the past two weeks, have you felt little interest or pleasure in doing things? no   The following portions of the patient's history were reviewed and updated as appropriate: allergies, current medications, past family history, past medical history, past social history, past surgical history and problem list.   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  remembers 3/3 at 5 minutes.  excellent recall.  can easily read and write a sentence.  alert and oriented x 3.     Assessment:   Medicare wellness utd on preventive parameters    Plan:   During the course of the visit the patient was educated and counseled about appropriate screening and preventive services including:        Fall prevention    Diabetes screening  Nutrition counseling   Vaccines / LABS Zostavax / Pneumococcal Vaccine  today  PSA  Patient Instructions (the written plan) was given to the patient.

## 2014-12-17 ENCOUNTER — Other Ambulatory Visit: Payer: Self-pay | Admitting: Endocrinology

## 2014-12-18 ENCOUNTER — Telehealth: Payer: Self-pay

## 2014-12-18 ENCOUNTER — Encounter: Payer: Self-pay | Admitting: Endocrinology

## 2014-12-18 NOTE — Telephone Encounter (Signed)
Error

## 2014-12-21 DIAGNOSIS — I129 Hypertensive chronic kidney disease with stage 1 through stage 4 chronic kidney disease, or unspecified chronic kidney disease: Secondary | ICD-10-CM | POA: Diagnosis not present

## 2014-12-21 DIAGNOSIS — N183 Chronic kidney disease, stage 3 (moderate): Secondary | ICD-10-CM | POA: Diagnosis not present

## 2014-12-21 DIAGNOSIS — D631 Anemia in chronic kidney disease: Secondary | ICD-10-CM | POA: Diagnosis not present

## 2014-12-21 DIAGNOSIS — N2581 Secondary hyperparathyroidism of renal origin: Secondary | ICD-10-CM | POA: Diagnosis not present

## 2014-12-28 DIAGNOSIS — H2513 Age-related nuclear cataract, bilateral: Secondary | ICD-10-CM | POA: Diagnosis not present

## 2014-12-28 DIAGNOSIS — H01023 Squamous blepharitis right eye, unspecified eyelid: Secondary | ICD-10-CM | POA: Diagnosis not present

## 2014-12-28 DIAGNOSIS — H43813 Vitreous degeneration, bilateral: Secondary | ICD-10-CM | POA: Diagnosis not present

## 2014-12-31 ENCOUNTER — Other Ambulatory Visit (INDEPENDENT_AMBULATORY_CARE_PROVIDER_SITE_OTHER): Payer: Medicare Other

## 2014-12-31 DIAGNOSIS — E876 Hypokalemia: Secondary | ICD-10-CM

## 2014-12-31 DIAGNOSIS — I10 Essential (primary) hypertension: Secondary | ICD-10-CM | POA: Diagnosis not present

## 2014-12-31 DIAGNOSIS — N289 Disorder of kidney and ureter, unspecified: Secondary | ICD-10-CM

## 2014-12-31 DIAGNOSIS — E785 Hyperlipidemia, unspecified: Secondary | ICD-10-CM

## 2014-12-31 DIAGNOSIS — E559 Vitamin D deficiency, unspecified: Secondary | ICD-10-CM

## 2014-12-31 DIAGNOSIS — R972 Elevated prostate specific antigen [PSA]: Secondary | ICD-10-CM

## 2014-12-31 DIAGNOSIS — Z125 Encounter for screening for malignant neoplasm of prostate: Secondary | ICD-10-CM

## 2014-12-31 DIAGNOSIS — Z0000 Encounter for general adult medical examination without abnormal findings: Secondary | ICD-10-CM

## 2014-12-31 DIAGNOSIS — R739 Hyperglycemia, unspecified: Secondary | ICD-10-CM

## 2014-12-31 LAB — BASIC METABOLIC PANEL
BUN: 30 mg/dL — AB (ref 6–23)
CO2: 31 meq/L (ref 19–32)
CREATININE: 1.79 mg/dL — AB (ref 0.40–1.50)
Calcium: 9.4 mg/dL (ref 8.4–10.5)
Chloride: 101 mEq/L (ref 96–112)
GFR: 38.63 mL/min — ABNORMAL LOW (ref >60.00)
Glucose, Bld: 158 mg/dL — ABNORMAL HIGH (ref 70–99)
POTASSIUM: 3.7 meq/L (ref 3.5–5.1)
SODIUM: 136 meq/L (ref 135–145)

## 2015-02-05 ENCOUNTER — Other Ambulatory Visit: Payer: Self-pay | Admitting: Endocrinology

## 2015-02-11 DIAGNOSIS — R972 Elevated prostate specific antigen [PSA]: Secondary | ICD-10-CM | POA: Diagnosis not present

## 2015-05-05 ENCOUNTER — Other Ambulatory Visit: Payer: Self-pay | Admitting: Endocrinology

## 2015-06-16 DIAGNOSIS — N400 Enlarged prostate without lower urinary tract symptoms: Secondary | ICD-10-CM | POA: Diagnosis not present

## 2015-06-16 DIAGNOSIS — R972 Elevated prostate specific antigen [PSA]: Secondary | ICD-10-CM | POA: Diagnosis not present

## 2015-09-22 DIAGNOSIS — M4806 Spinal stenosis, lumbar region: Secondary | ICD-10-CM | POA: Diagnosis not present

## 2015-09-22 DIAGNOSIS — M7060 Trochanteric bursitis, unspecified hip: Secondary | ICD-10-CM | POA: Diagnosis not present

## 2015-09-22 DIAGNOSIS — Z96641 Presence of right artificial hip joint: Secondary | ICD-10-CM | POA: Diagnosis not present

## 2015-09-28 DIAGNOSIS — M25559 Pain in unspecified hip: Secondary | ICD-10-CM | POA: Diagnosis not present

## 2015-10-16 DIAGNOSIS — Z96641 Presence of right artificial hip joint: Secondary | ICD-10-CM | POA: Diagnosis not present

## 2015-10-30 ENCOUNTER — Other Ambulatory Visit: Payer: Self-pay | Admitting: Endocrinology

## 2015-11-22 ENCOUNTER — Other Ambulatory Visit: Payer: Self-pay | Admitting: Endocrinology

## 2015-11-24 DIAGNOSIS — D0462 Carcinoma in situ of skin of left upper limb, including shoulder: Secondary | ICD-10-CM | POA: Diagnosis not present

## 2015-11-24 DIAGNOSIS — L570 Actinic keratosis: Secondary | ICD-10-CM | POA: Diagnosis not present

## 2015-11-24 DIAGNOSIS — L400 Psoriasis vulgaris: Secondary | ICD-10-CM | POA: Diagnosis not present

## 2015-12-07 DIAGNOSIS — M1990 Unspecified osteoarthritis, unspecified site: Secondary | ICD-10-CM | POA: Diagnosis not present

## 2015-12-07 DIAGNOSIS — E782 Mixed hyperlipidemia: Secondary | ICD-10-CM | POA: Diagnosis not present

## 2015-12-07 DIAGNOSIS — N183 Chronic kidney disease, stage 3 (moderate): Secondary | ICD-10-CM | POA: Diagnosis not present

## 2015-12-07 DIAGNOSIS — E041 Nontoxic single thyroid nodule: Secondary | ICD-10-CM | POA: Diagnosis not present

## 2015-12-07 DIAGNOSIS — I129 Hypertensive chronic kidney disease with stage 1 through stage 4 chronic kidney disease, or unspecified chronic kidney disease: Secondary | ICD-10-CM | POA: Diagnosis not present

## 2015-12-07 DIAGNOSIS — Z0000 Encounter for general adult medical examination without abnormal findings: Secondary | ICD-10-CM | POA: Diagnosis not present

## 2015-12-07 DIAGNOSIS — I471 Supraventricular tachycardia: Secondary | ICD-10-CM | POA: Diagnosis not present

## 2015-12-07 DIAGNOSIS — N2581 Secondary hyperparathyroidism of renal origin: Secondary | ICD-10-CM | POA: Diagnosis not present

## 2015-12-07 DIAGNOSIS — L409 Psoriasis, unspecified: Secondary | ICD-10-CM | POA: Diagnosis not present

## 2015-12-07 DIAGNOSIS — D631 Anemia in chronic kidney disease: Secondary | ICD-10-CM | POA: Diagnosis not present

## 2015-12-07 DIAGNOSIS — E039 Hypothyroidism, unspecified: Secondary | ICD-10-CM | POA: Diagnosis not present

## 2015-12-10 DIAGNOSIS — N400 Enlarged prostate without lower urinary tract symptoms: Secondary | ICD-10-CM | POA: Diagnosis not present

## 2015-12-16 ENCOUNTER — Encounter: Payer: Self-pay | Admitting: Endocrinology

## 2015-12-16 ENCOUNTER — Ambulatory Visit (INDEPENDENT_AMBULATORY_CARE_PROVIDER_SITE_OTHER): Payer: Medicare Other | Admitting: Endocrinology

## 2015-12-16 VITALS — BP 126/82 | HR 68 | Temp 97.9°F | Ht 70.5 in | Wt 176.0 lb

## 2015-12-16 DIAGNOSIS — E785 Hyperlipidemia, unspecified: Secondary | ICD-10-CM | POA: Diagnosis not present

## 2015-12-16 DIAGNOSIS — E559 Vitamin D deficiency, unspecified: Secondary | ICD-10-CM | POA: Diagnosis not present

## 2015-12-16 DIAGNOSIS — I10 Essential (primary) hypertension: Secondary | ICD-10-CM

## 2015-12-16 DIAGNOSIS — R42 Dizziness and giddiness: Secondary | ICD-10-CM | POA: Diagnosis not present

## 2015-12-16 DIAGNOSIS — E039 Hypothyroidism, unspecified: Secondary | ICD-10-CM

## 2015-12-16 DIAGNOSIS — N289 Disorder of kidney and ureter, unspecified: Secondary | ICD-10-CM

## 2015-12-16 DIAGNOSIS — R739 Hyperglycemia, unspecified: Secondary | ICD-10-CM

## 2015-12-16 DIAGNOSIS — Z0000 Encounter for general adult medical examination without abnormal findings: Secondary | ICD-10-CM | POA: Diagnosis not present

## 2015-12-16 DIAGNOSIS — R972 Elevated prostate specific antigen [PSA]: Secondary | ICD-10-CM

## 2015-12-16 LAB — BASIC METABOLIC PANEL
BUN: 23 mg/dL (ref 6–23)
CALCIUM: 9.5 mg/dL (ref 8.4–10.5)
CO2: 29 meq/L (ref 19–32)
CREATININE: 1.36 mg/dL (ref 0.40–1.50)
Chloride: 100 mEq/L (ref 96–112)
GFR: 52.93 mL/min — ABNORMAL LOW (ref >60.00)
GLUCOSE: 104 mg/dL — AB (ref 70–99)
Potassium: 3.6 mEq/L (ref 3.5–5.1)
Sodium: 137 mEq/L (ref 135–145)

## 2015-12-16 LAB — CBC WITH DIFFERENTIAL/PLATELET
BASOS ABS: 0 10*3/uL (ref 0.0–0.1)
Basophils Relative: 0.5 % (ref 0.0–3.0)
EOS ABS: 0.6 10*3/uL (ref 0.0–0.7)
Eosinophils Relative: 8.2 % — ABNORMAL HIGH (ref 0.0–5.0)
HCT: 44.2 % (ref 39.0–52.0)
HEMOGLOBIN: 14.9 g/dL (ref 13.0–17.0)
LYMPHS PCT: 29.7 % (ref 12.0–46.0)
Lymphs Abs: 2.2 10*3/uL (ref 0.7–4.0)
MCHC: 33.8 g/dL (ref 30.0–36.0)
MCV: 91.6 fl (ref 78.0–100.0)
MONO ABS: 0.5 10*3/uL (ref 0.1–1.0)
Monocytes Relative: 6.2 % (ref 3.0–12.0)
NEUTROS PCT: 55.4 % (ref 43.0–77.0)
Neutro Abs: 4.2 10*3/uL (ref 1.4–7.7)
Platelets: 180 10*3/uL (ref 150.0–400.0)
RBC: 4.82 Mil/uL (ref 4.22–5.81)
RDW: 13.5 % (ref 11.5–15.5)
WBC: 7.5 10*3/uL (ref 4.0–10.5)

## 2015-12-16 LAB — LIPID PANEL
CHOLESTEROL: 132 mg/dL (ref 0–200)
HDL: 24.4 mg/dL — AB (ref >39.00)
LDL Cholesterol: 74 mg/dL (ref 0–99)
NonHDL: 108.08
Total CHOL/HDL Ratio: 5
Triglycerides: 171 mg/dL — ABNORMAL HIGH (ref 0.0–149.0)
VLDL: 34.2 mg/dL (ref 0.0–40.0)

## 2015-12-16 LAB — HEPATIC FUNCTION PANEL
ALT: 15 U/L (ref 0–53)
AST: 18 U/L (ref 0–37)
Albumin: 3.9 g/dL (ref 3.5–5.2)
Alkaline Phosphatase: 83 U/L (ref 39–117)
Bilirubin, Direct: 0.3 mg/dL (ref 0.0–0.3)
TOTAL PROTEIN: 6.8 g/dL (ref 6.0–8.3)
Total Bilirubin: 1.7 mg/dL — ABNORMAL HIGH (ref 0.2–1.2)

## 2015-12-16 LAB — HEMOGLOBIN A1C: Hgb A1c MFr Bld: 6.3 % (ref 4.6–6.5)

## 2015-12-16 LAB — VITAMIN D 25 HYDROXY (VIT D DEFICIENCY, FRACTURES): VITD: 16.73 ng/mL — AB (ref 30.00–100.00)

## 2015-12-16 LAB — TSH: TSH: 1.87 u[IU]/mL (ref 0.35–4.50)

## 2015-12-16 MED ORDER — METOPROLOL SUCCINATE ER 25 MG PO TB24: 25.0000 mg | ORAL_TABLET | Freq: Every day | ORAL | Status: DC

## 2015-12-16 MED ORDER — ERGOCALCIFEROL 1.25 MG (50000 UT) PO CAPS: 50000.0000 [IU] | ORAL_CAPSULE | ORAL | Status: DC

## 2015-12-16 NOTE — Patient Instructions (Addendum)
i have sent a prescription to your pharmacy, to change the metoprolol to 25 mg daily (extended-release). blood tests are requested for you today.  We'll let you know about the results.  good diet and exercise significantly improve your health.  please let me know if you wish to be referred to a dietician.  high blood sugar is very risky to your health.  you should see an eye doctor and dentist every year.  It is very important to get all recommended vaccinations.  please consider these measures for your health:  minimize alcohol.  do not use tobacco products.  have a colonoscopy at least every 10 years from age 23.  keep firearms safely stored.  always use seat belts.  have working smoke alarms in your home.  see an eye doctor and dentist regularly.  never drive under the influence of alcohol or drugs (including prescription drugs).  those with fair skin should take precautions against the sun. it is critically important to prevent falling down (keep floor areas well-lit, dry, and free of loose objects.  If you have a cane, walker, or wheelchair, you should use it, even for short trips around the house.  Wear flat-soled shoes.  Also, try not to rush).   If the blood tests are normal, you are good to go for the surgery.   Please return in 1 year.

## 2015-12-16 NOTE — Progress Notes (Signed)
Subjective:    Patient ID: Brendan Long, male    DOB: Apr 05, 1929, 82 y.o.   MRN: [id number]  HPI Pt states mild intermittent dizziness sensation in the head.  No assoc LOC.  Past Medical History  Diagnosis Date  . Hyperlipidemia   . Hypertension   . Psoriasis   . Varicose veins   . ED (erectile dysfunction)   . Hypothyroidism   . Vitamin D deficiency     Past Surgical History  Procedure Laterality Date  . Rotator cuff repair, right shoulder per dr. Tonita Cong  1996  . Torn right bicepts muscle  1996  . Total hip arthroplasty  01/17/10    redone on right hip on 09/05/10  . Joint replacement    . Joint replacement rt and left      Social History   Social History  . Marital Status: Widowed    Spouse Name: N/A  . Number of Children: N/A  . Years of Education: N/A   Occupational History  . Not on file.   Social History Main Topics  . Smoking status: Former Research scientist (life sciences)  . Smokeless tobacco: Never Used  . Alcohol Use: No  . Drug Use: No  . Sexual Activity: Yes   Other Topics Concern  . Not on file   Social History Narrative    Current Outpatient Prescriptions on File Prior to Visit  Medication Sig Dispense Refill  . aspirin 325 MG tablet Take 325 mg by mouth daily.    Marland Kitchen atorvastatin (LIPITOR) 80 MG tablet TAKE 1 TABLET DAILY 90 tablet 0  . clobetasol (TEMOVATE) 0.05 % ointment Apply topically 2 (two) times daily.      . diclofenac sodium (VOLTAREN) 1 % GEL Apply 4 g topically 4 (four) times daily. As needed for knee pain 200 g 11  . hydrochlorothiazide (HYDRODIURIL) 12.5 MG tablet TAKE 1 TABLET DAILY 90 tablet 2  . niacin 500 MG tablet Take 500 mg by mouth daily.    Marland Kitchen omega-3 acid ethyl esters (LOVAZA) 1 G capsule Take 2 capsules (2 g total) by mouth 2 (two) times daily. 120 capsule 11  . TRICOR 48 MG tablet TAKE 1 TABLET DAILY 90 tablet 0   No current facility-administered medications on file prior to visit.    No Known Allergies  Family History  Problem  Relation Age of Onset  . Breast cancer      BP 126/82 mmHg  Pulse 68  Temp(Src) 97.9 F (36.6 C) (Oral)  Ht 5' 10.5" (1.791 m)  Wt 176 lb (79.833 kg)  BMI 24.89 kg/m2  SpO2 96%   Review of Systems Denies chest pain and sob.     Objective:   Physical Exam VITAL SIGNS:  See vs page GENERAL: no distress LUNGS:  Clear to auscultation HEART:  Regular rate and rhythm without murmurs noted. Normal S1,S2.     i personally reviewed electrocardiogram tracing (today): Indication: dizziness Impression: first-deg. AVB     Assessment & Plan:  Dizziness, possibly due to b-blocker: i have sent a prescription to your pharmacy, to change the metoprolol to 25 mg daily (extended-release). Vit-D deficiency, recurrent.  i have sent a prescription to your pharmacy, for ergocalciferol his surgical risk is low and outweighed by the potential benefit of the surgery.  he is therefore medically cleared.    Subjective:   Patient here for Medicare annual wellness visit and management of other chronic and acute problems.     Risk factors: advanced age  Roster of Physicians Providing Medical Care to Patient:  See "snapshot"   Activities of Daily Living: In your present state of health, do you have any difficulty performing the following activities (lives alone)?:  Preparing food and eating?: No  Bathing yourself: No  Getting dressed: No  Using the toilet:No  Moving around from place to place: No  In the past year have you fallen or had a near fall?: No    Home Safety: Has smoke detector and wears seat belts. No firearms. No excess sun exposure.    Diet and Exercise  Current exercise habits: pt says good Dietary issues discussed: pt reports a healthy diet   Depression Screen  Q1: Over the past two weeks, have you felt down, depressed or hopeless? no  Q2: Over the past two weeks, have you felt little interest or pleasure in doing things? no   The following portions of the patient's  history were reviewed and updated as appropriate: allergies, current medications, past family history, past medical history, past social history, past surgical history and problem list.   Review of Systems  Denies hearing loss, and visual loss Objective:   Vision:  Sees opthalmologist Dr Tacy [facility] Hearing: grossly normal Body mass index:  See vs page Msk: pt easily and quickly performs "get-up-and-go" from a sitting position.   Cognitive Impairment Assessment: cognition, memory and judgment appear normal.  remembers 3/3 at 5 minutes.  excellent recall.  can easily read and write a sentence.  alert and oriented x 3   Assessment:   Medicare wellness utd on preventive parameters    Plan:   During the course of the visit the patient was educated and counseled about appropriate screening and preventive services including:        Fall prevention   Diabetes screening  Nutrition counseling   Vaccines / LABS Zostavax / Pneumococcal Vaccine  today  PSA  Patient Instructions (the written plan) was given to the patient.

## 2015-12-17 DIAGNOSIS — N400 Enlarged prostate without lower urinary tract symptoms: Secondary | ICD-10-CM | POA: Diagnosis not present

## 2015-12-17 DIAGNOSIS — Z0000 Encounter for general adult medical examination without abnormal findings: Secondary | ICD-10-CM | POA: Diagnosis not present

## 2015-12-17 DIAGNOSIS — R972 Elevated prostate specific antigen [PSA]: Secondary | ICD-10-CM | POA: Diagnosis not present

## 2016-01-21 ENCOUNTER — Telehealth: Payer: Self-pay | Admitting: Endocrinology

## 2016-01-21 DIAGNOSIS — N529 Male erectile dysfunction, unspecified: Secondary | ICD-10-CM

## 2016-01-21 MED ORDER — SILDENAFIL CITRATE 100 MG PO TABS: 100.0000 mg | ORAL_TABLET | ORAL | Status: DC | PRN

## 2016-01-21 NOTE — Telephone Encounter (Signed)
Patient need a refill of Viagra send to [facility] [address] [place], [place] [address] Phone: [phone]

## 2016-01-21 NOTE — Telephone Encounter (Signed)
Please advise if we can refill. Medication is not on current med list. Thanks!

## 2016-01-21 NOTE — Telephone Encounter (Signed)
i refilled 

## 2016-01-28 DIAGNOSIS — M1611 Unilateral primary osteoarthritis, right hip: Secondary | ICD-10-CM | POA: Diagnosis not present

## 2016-01-28 DIAGNOSIS — Z01818 Encounter for other preprocedural examination: Secondary | ICD-10-CM | POA: Diagnosis not present

## 2016-01-28 DIAGNOSIS — I10 Essential (primary) hypertension: Secondary | ICD-10-CM | POA: Diagnosis present

## 2016-01-28 DIAGNOSIS — I4891 Unspecified atrial fibrillation: Secondary | ICD-10-CM | POA: Diagnosis not present

## 2016-01-28 DIAGNOSIS — M25551 Pain in right hip: Secondary | ICD-10-CM | POA: Diagnosis not present

## 2016-01-28 DIAGNOSIS — Z96641 Presence of right artificial hip joint: Secondary | ICD-10-CM | POA: Diagnosis not present

## 2016-01-28 DIAGNOSIS — I739 Peripheral vascular disease, unspecified: Secondary | ICD-10-CM | POA: Diagnosis present

## 2016-01-28 DIAGNOSIS — Z7982 Long term (current) use of aspirin: Secondary | ICD-10-CM | POA: Diagnosis not present

## 2016-01-28 DIAGNOSIS — Z87891 Personal history of nicotine dependence: Secondary | ICD-10-CM | POA: Diagnosis not present

## 2016-01-28 DIAGNOSIS — M7060 Trochanteric bursitis, unspecified hip: Secondary | ICD-10-CM | POA: Diagnosis present

## 2016-01-28 DIAGNOSIS — T84091A Other mechanical complication of internal left hip prosthesis, initial encounter: Secondary | ICD-10-CM | POA: Diagnosis present

## 2016-01-28 DIAGNOSIS — Z9889 Other specified postprocedural states: Secondary | ICD-10-CM | POA: Diagnosis not present

## 2016-01-28 DIAGNOSIS — M4806 Spinal stenosis, lumbar region: Secondary | ICD-10-CM | POA: Diagnosis present

## 2016-01-30 ENCOUNTER — Other Ambulatory Visit: Payer: Self-pay | Admitting: Endocrinology

## 2016-03-07 ENCOUNTER — Ambulatory Visit
Admission: RE | Admit: 2016-03-07 | Discharge: 2016-03-07 | Disposition: A | Discharge status: Home / Self Care | Payer: Medicare Other | Source: Ambulatory Visit | Attending: Orthopedic Surgery | Admitting: Orthopedic Surgery

## 2016-03-07 ENCOUNTER — Other Ambulatory Visit: Payer: Self-pay | Admitting: Orthopedic Surgery

## 2016-03-07 DIAGNOSIS — Z96641 Presence of right artificial hip joint: Secondary | ICD-10-CM | POA: Diagnosis not present

## 2016-03-07 DIAGNOSIS — Z471 Aftercare following joint replacement surgery: Secondary | ICD-10-CM | POA: Diagnosis not present

## 2016-03-07 DIAGNOSIS — Z96649 Presence of unspecified artificial hip joint: Secondary | ICD-10-CM

## 2016-03-07 IMAGING — CR DG HIP (WITH OR WITHOUT PELVIS) 2-3V*R*
3 series · 3 of 3 positions shown · non-contrast
Comparison: None.

CLINICAL DATA: History of 3 right hip replacements, most recently 5
weeks ago with concern for stability

EXAM:
DG HIP (WITH OR WITHOUT PELVIS) 2-3V RIGHT

[w pelvis upright]
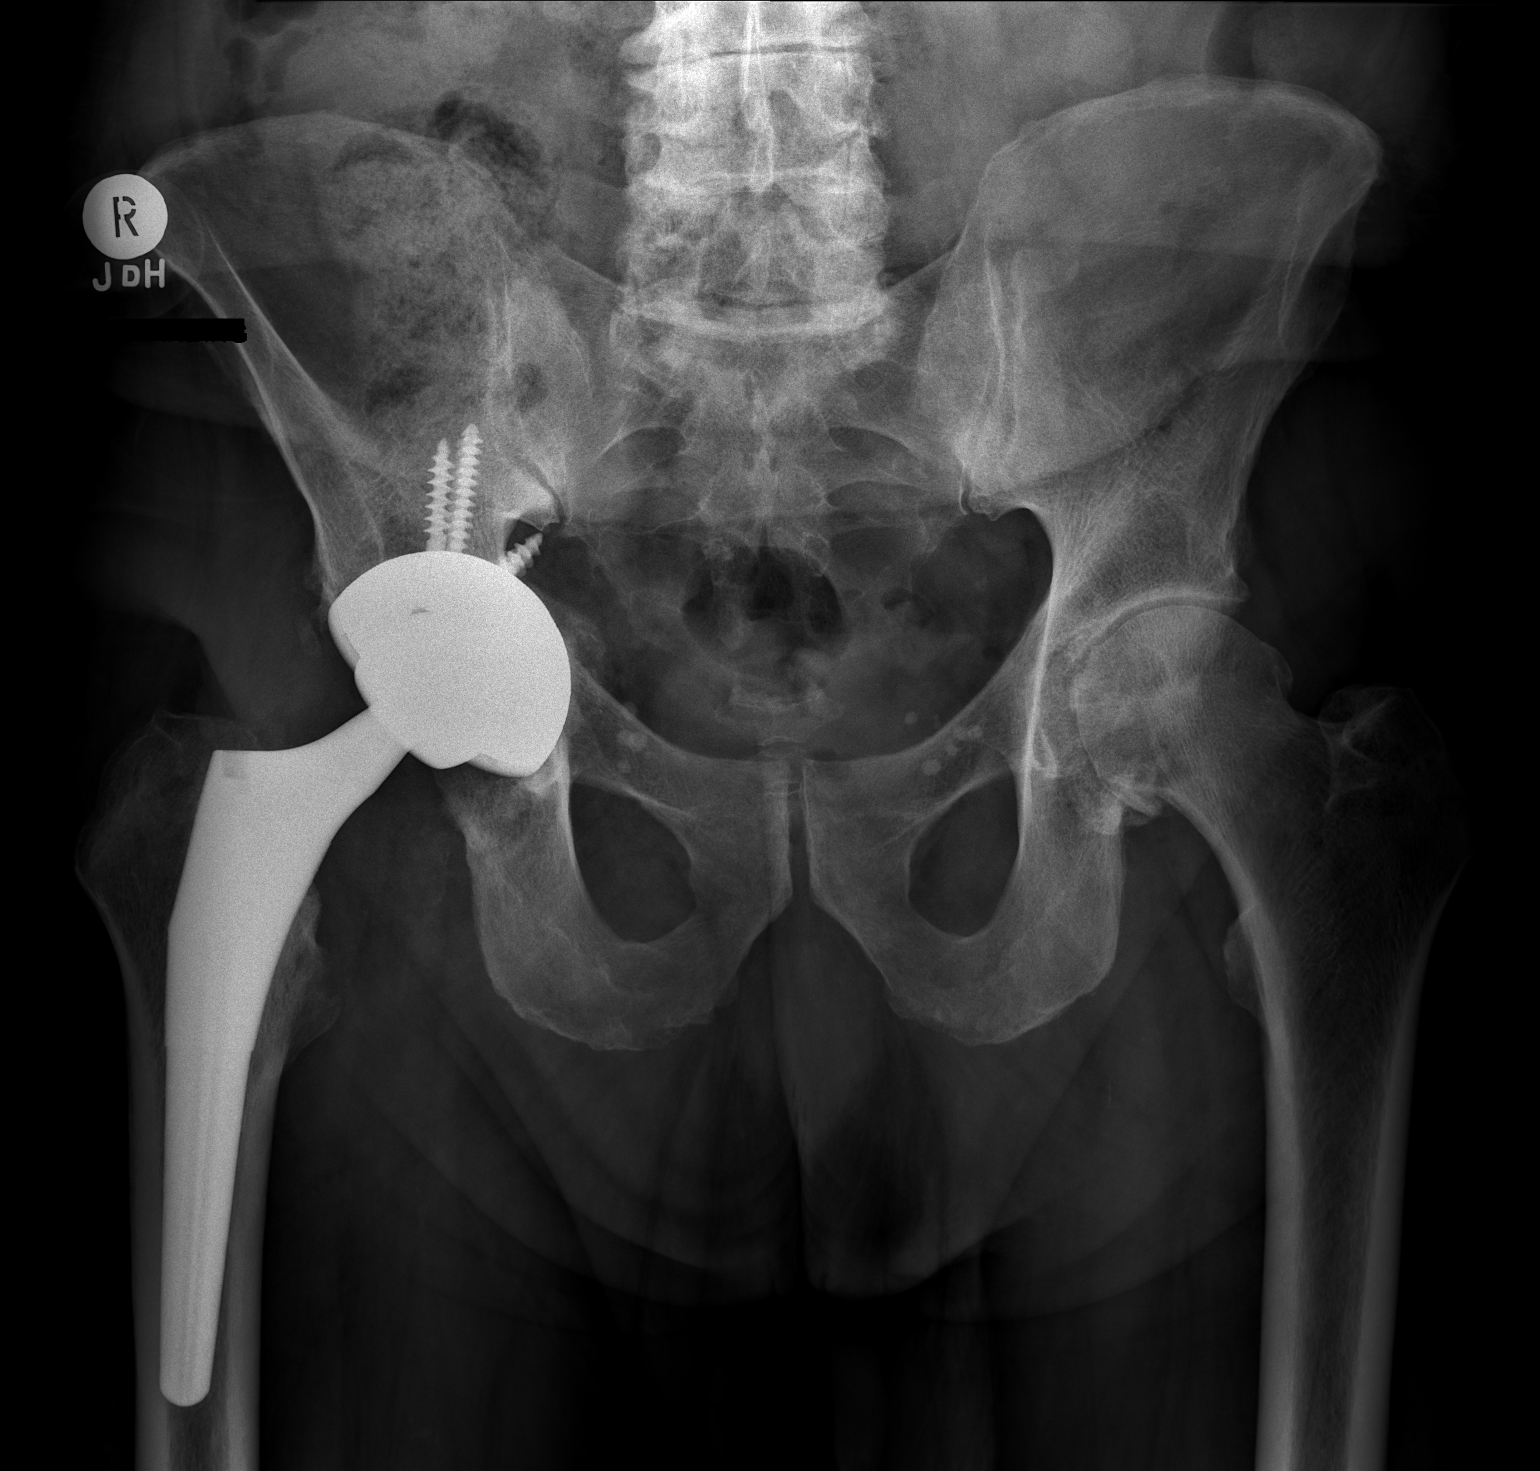

[w hip ap right]
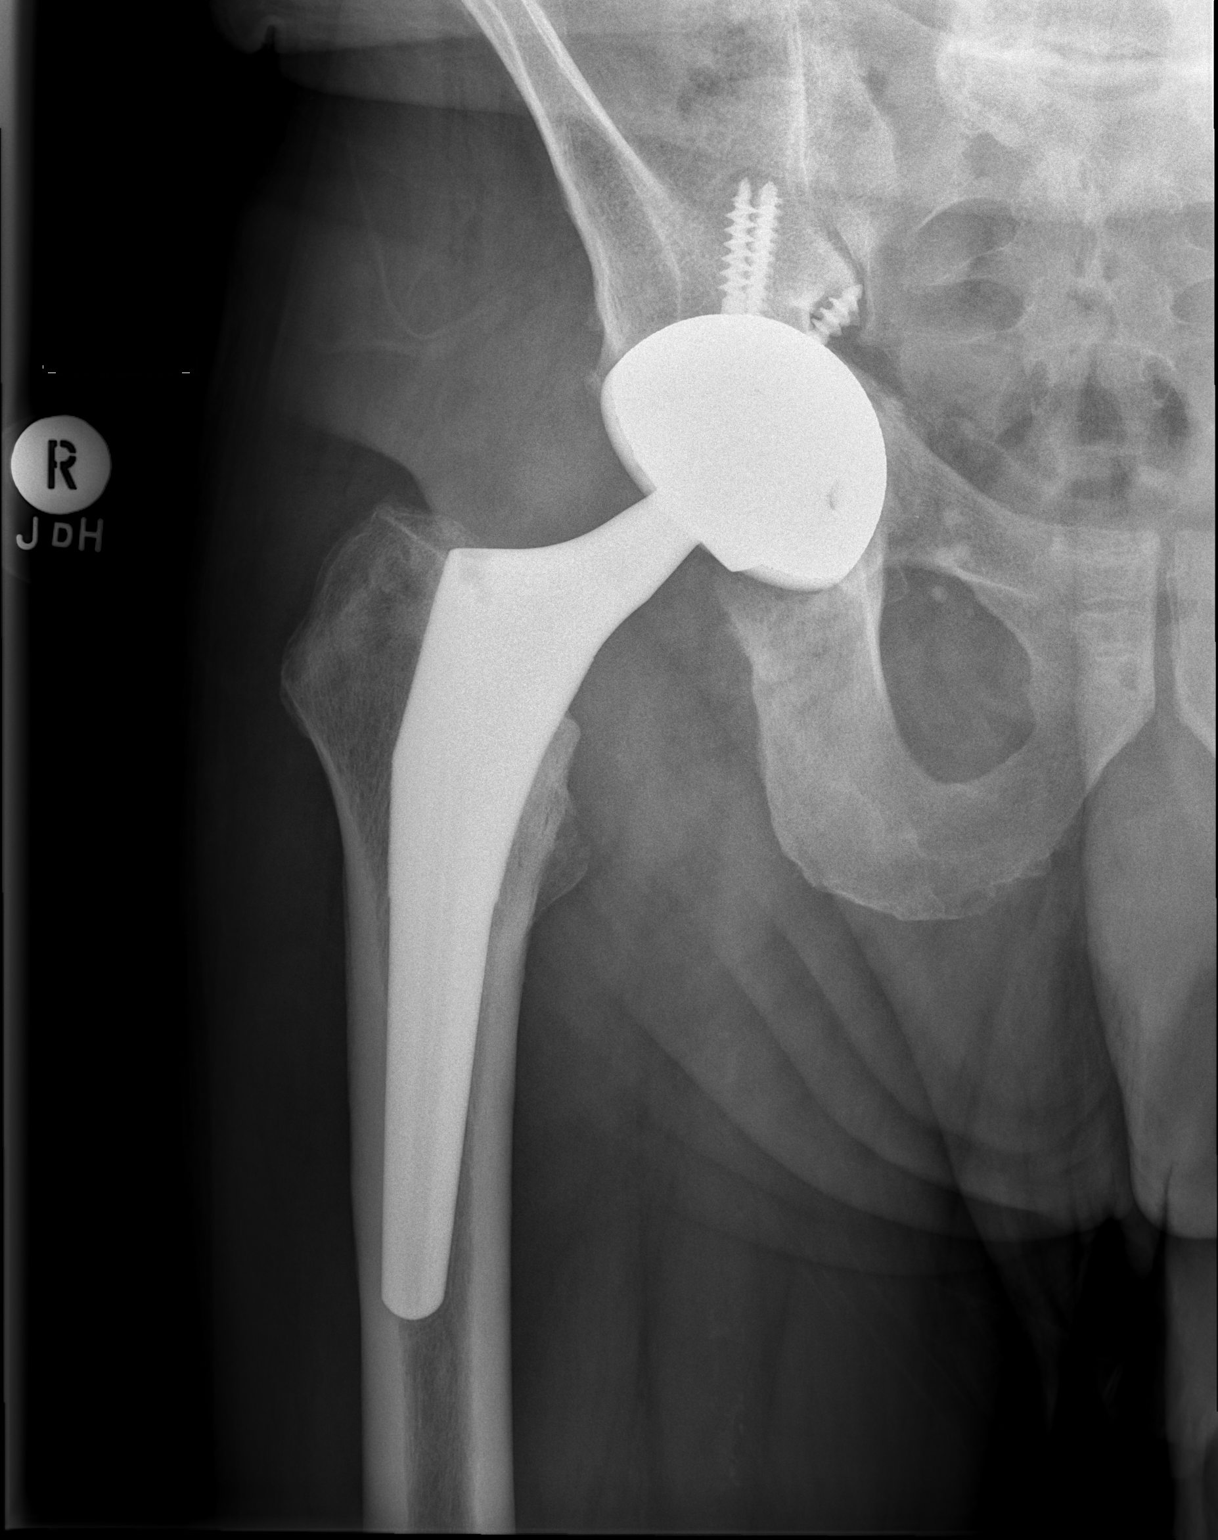

[w hip frog right]
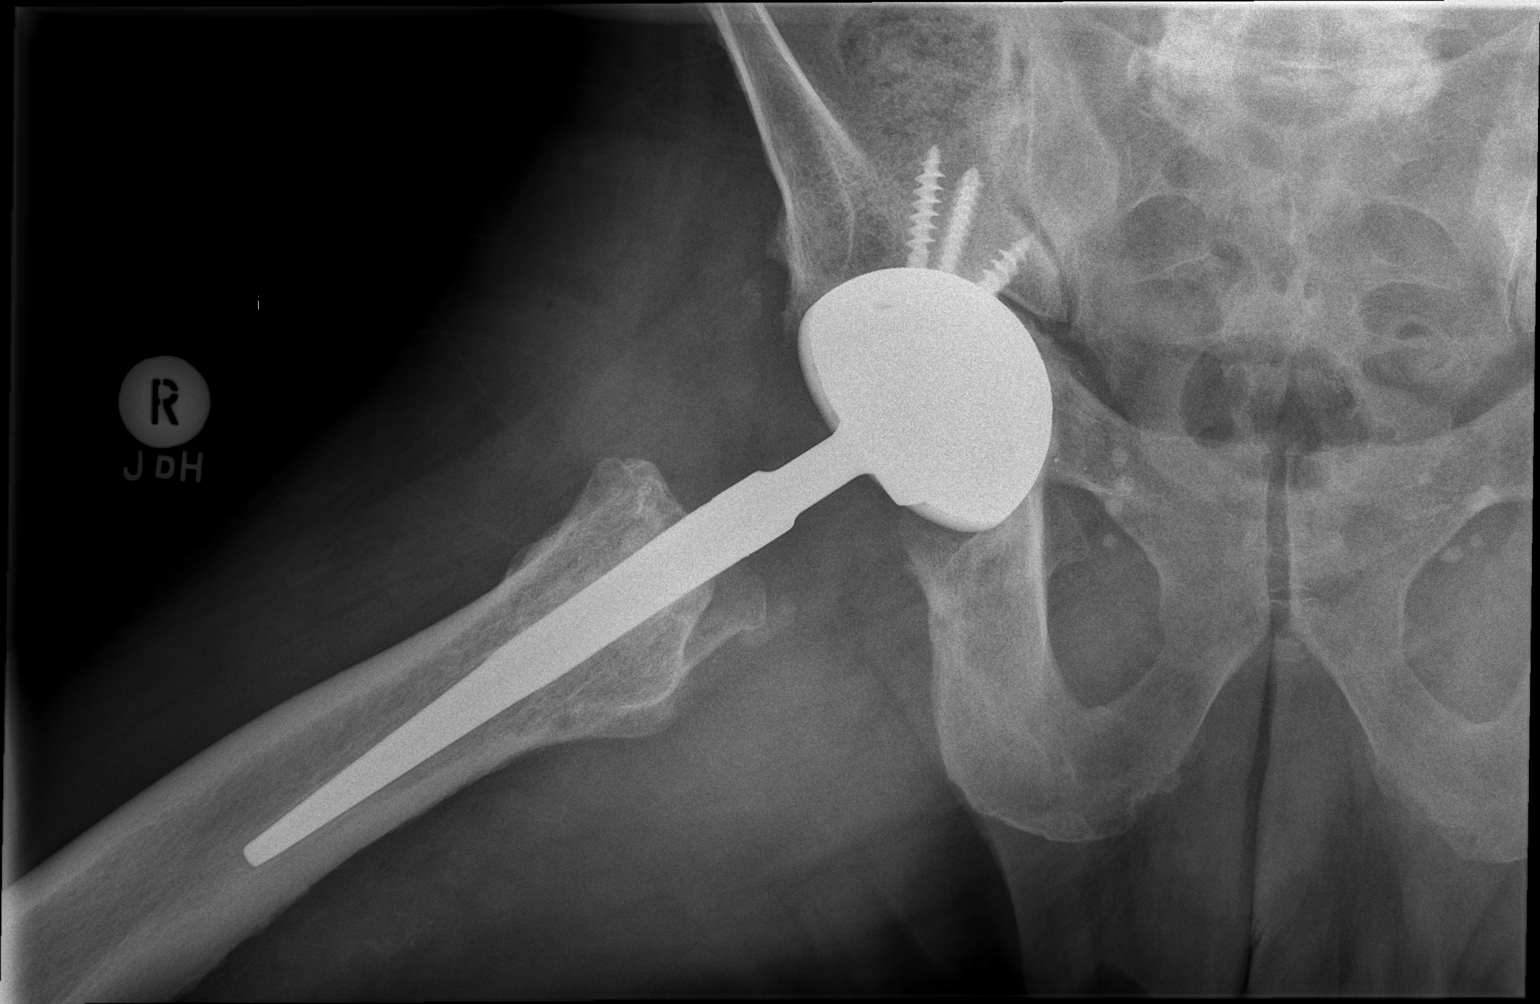

[3 of 3 positions shown; findings below may reference images not displayed]

FINDINGS: The femoral and acetabular components of the right hip replacement
appear to be in good position. The stem of the femoral component is
slightly laterally positioned. No acute fracture is seen. Moderate
degenerative joint disease of the left hip is noted. The pelvic rami
are intact. There are degenerative changes in the lower lumbar
spine.
IMPRESSION: 1. Right hip replacement components in good position as noted above.
No acute abnormality.
2. Moderate degenerative joint disease of the left hip.

## 2016-04-03 ENCOUNTER — Encounter (HOSPITAL_COMMUNITY): Payer: Self-pay

## 2016-04-03 ENCOUNTER — Emergency Department (HOSPITAL_COMMUNITY)
Admission: EM | Admit: 2016-04-03 | Discharge: 2016-04-03 | Disposition: A | Discharge status: Home / Self Care | Payer: Medicare Other | Attending: Emergency Medicine | Admitting: Emergency Medicine

## 2016-04-03 DIAGNOSIS — R040 Epistaxis: Secondary | ICD-10-CM | POA: Insufficient documentation

## 2016-04-03 DIAGNOSIS — E039 Hypothyroidism, unspecified: Secondary | ICD-10-CM | POA: Insufficient documentation

## 2016-04-03 DIAGNOSIS — I10 Essential (primary) hypertension: Secondary | ICD-10-CM | POA: Insufficient documentation

## 2016-04-03 DIAGNOSIS — Z87891 Personal history of nicotine dependence: Secondary | ICD-10-CM | POA: Diagnosis not present

## 2016-04-03 DIAGNOSIS — Z79899 Other long term (current) drug therapy: Secondary | ICD-10-CM | POA: Insufficient documentation

## 2016-04-03 DIAGNOSIS — Z7982 Long term (current) use of aspirin: Secondary | ICD-10-CM | POA: Insufficient documentation

## 2016-04-03 DIAGNOSIS — G8929 Other chronic pain: Secondary | ICD-10-CM | POA: Diagnosis not present

## 2016-04-03 MED ORDER — SILVER NITRATE-POT NITRATE 75-25 % EX MISC
1.0000 "application " | Freq: Once | CUTANEOUS | Status: DC
  Filled 2016-04-03: qty 2

## 2016-04-03 NOTE — ED Provider Notes (Signed)
[facility] DEPT Provider Note   CSN: [id number] Arrival date & time: 04/03/16  0718     History   Chief Complaint Chief Complaint  Patient presents with  . Epistaxis    HPI Brendan Long is a 82 y.o. male.  HPI Patient has had nosebleed from left nostril started yesterday. Has had 3 episodes of epistaxis since yesterday. He is managed stop bleeding with direct pressure on his nostrils. He is asymptomatic presently. Not bleeding presently. No other associated symptoms no lightheadedness. Nothing makes symptoms better or worse. Treatment was pinching his nostrils shut. No other treatment. Patient had nosebleed several years ago. His nose was cauterized in the emergency department Past Medical History:  Diagnosis Date  . ED (erectile dysfunction)   . Hyperlipidemia   . Hypertension   . Hypothyroidism   . Psoriasis   . Varicose veins   . Vitamin D deficiency     Patient Active Problem List   Diagnosis Date Noted  . Elevated PSA 12/16/2014  . Hypokalemia 12/16/2014  . Hyperglycemia 12/15/2013  . Renal insufficiency 12/15/2013  . Dyspnea 12/15/2013  . Hand pain, right 12/15/2013  . Screening for cancer 12/05/2012  . Encounter for long-term (current) use of other medications 12/05/2011  . Screening for prostate cancer 12/05/2011  . Osteoarthritis 12/05/2011  . Cough 12/05/2011  . ERECTILE DYSFUNCTION, NON-ORGANIC 11/09/2009  . Vitamin D deficiency 08/13/2008  . VARICOSE VEINS, LOWER EXTREMITIES 08/13/2008  . UNS ADVRS EFF OTH RX MEDICINAL&BIOLOGICAL SBSTNC 08/13/2008  . Hypothyroidism 05/02/2007  . PSORIASIS 05/02/2007  . Dyslipidemia 03/12/2007  . Essential hypertension 03/12/2007    Past Surgical History:  Procedure Laterality Date  . JOINT REPLACEMENT    . joint replacement rt and left    . rotator cuff repair, right shoulder per Dr. Tonita Cong  1996  . torn right bicepts muscle  1996  . TOTAL HIP ARTHROPLASTY  01/17/10   redone on right hip on 09/05/10        Home Medications    Prior to Admission medications   Medication Sig Start Date End Date Taking? Authorizing Provider  aspirin 325 MG tablet Take 325 mg by mouth daily.    Historical Provider, MD  atorvastatin (LIPITOR) 80 MG tablet TAKE 1 TABLET DAILY 01/31/16   Renato Shin, MD  clobetasol (TEMOVATE) 0.05 % ointment Apply topically 2 (two) times daily.      Historical Provider, MD  diclofenac sodium (VOLTAREN) 1 % GEL Apply 4 g topically 4 (four) times daily. As needed for knee pain 12/16/14   Renato Shin, MD  ergocalciferol (VITAMIN D2) 50000 units capsule Take 1 capsule (50,000 Units total) by mouth 2 (two) times a week. 12/16/15   Renato Shin, MD  fenofibrate (TRICOR) 48 MG tablet TAKE 1 TABLET DAILY 01/31/16   Renato Shin, MD  hydrochlorothiazide (HYDRODIURIL) 12.5 MG tablet TAKE 1 TABLET DAILY 11/22/15   Renato Shin, MD  metoprolol succinate (TOPROL-XL) 25 MG 24 hr tablet Take 1 tablet (25 mg total) by mouth daily. 12/16/15   Renato Shin, MD  niacin 500 MG tablet Take 500 mg by mouth daily.    Historical Provider, MD  omega-3 acid ethyl esters (LOVAZA) 1 G capsule Take 2 capsules (2 g total) by mouth 2 (two) times daily. 03/11/13 03/11/14  Renato Shin, MD  sildenafil (VIAGRA) 100 MG tablet Take 1 tablet (100 mg total) by mouth as needed for erectile dysfunction. Take 1 one hour before needed 01/21/16   Renato Shin, MD   Ibuprofen  Family History Family History  Problem Relation Age of Onset  . Breast cancer      Social History Social History  Substance Use Topics  . Smoking status: Former Research scientist (life sciences)  . Smokeless tobacco: Never Used  . Alcohol use No     Allergies   Review of patient's allergies indicates no known allergies.   Review of Systems Review of Systems  Constitutional: Negative.   HENT: Positive for nosebleeds.   Respiratory: Negative.   Cardiovascular: Negative.   Gastrointestinal: Negative.   Musculoskeletal: Positive for arthralgias.       Chronic pain  from degenerative arthritis  Skin: Negative.   Neurological: Negative.   Psychiatric/Behavioral: Negative.   All other systems reviewed and are negative.    Physical Exam Updated Vital Signs BP (!) 154/104 (BP Location: Right Arm)   Pulse 93   Temp 97.6 F (36.4 C) (Oral)   Resp 16   SpO2 100%   Physical Exam  Constitutional: He appears well-developed and well-nourished.  HENT:  Head: Normocephalic and atraumatic.  Right Ear: External ear normal.  Nose: Nose normal.  Mouth/Throat: Oropharynx is clear and moist.  No suspected using headlamp and nasal speculum. No dried blood at nares no bleeding site visualized  Eyes: Conjunctivae are normal. Pupils are equal, round, and reactive to light.  Neck: Neck supple. No tracheal deviation present. No thyromegaly present.  Cardiovascular: Normal rate and regular rhythm.   No murmur heard. Pulmonary/Chest: Effort normal and breath sounds normal.  Abdominal: Soft. Bowel sounds are normal. He exhibits no distension. There is no tenderness.  Musculoskeletal: Normal range of motion. He exhibits no edema or tenderness.  Neurological: He is alert. Coordination normal.  Skin: Skin is warm and dry. No rash noted.  Psychiatric: He has a normal mood and affect.  Nursing note and vitals reviewed.    ED Treatments / Results  Labs (all labs ordered are listed, but only abnormal results are displayed) Labs Reviewed - No data to display  EKG  EKG Interpretation None       Radiology No results found.  Procedures Procedures (including critical care time)  Medications Ordered in ED Medications  silver nitrate applicators applicator 1 application ( Topical Canceled Entry 04/03/16 0802)     Initial Impression / Assessment and Plan / ED Course  I have reviewed the triage vital signs and the nursing notes.  Pertinent labs & imaging results that were available during my care of the patient were reviewed by me and considered in my  medical decision making (see chart for details).  Clinical Course    Discussed option of packing nose which patient declines. He'll be referred to ENT as needed for further nose bleeds. Suggest saline nasal spray. Stop ibuprofen  Final Clinical Impressions(s) / ED Diagnoses  Diagnosis epistaxis Final diagnoses:  None    New Prescriptions New Prescriptions   No medications on file     Orlie Dakin, MD 04/03/16 [phone]

## 2016-04-03 NOTE — ED Notes (Signed)
MD at bedside.  EDP J PRESENT 

## 2016-04-03 NOTE — ED Triage Notes (Signed)
Pt c/o intermittent epistaxis x 1 week.  Pain score 2/10.  Pt reports most recent episode started yesterday.  Denies injury.  Sts "I had to have it cauterized a few years ago."  Pt reports taking "a couple 81mg  aspirin everyday."

## 2016-04-03 NOTE — Discharge Instructions (Signed)
Squirt saline nasal spray once into each nostril 4 times daily to prevent nosebleeds and keep the inside of your nose moist. Stop ibuprofen. Instead take Tylenol for aches. Ibuprofen can make you bleed more easily. If nosebleeds continue call Dr. Sofie Hartigan to schedule an office visit. If your nose bleed recurs, pinch your nose shut for 20 minutes with your fingers. If  youcan't get the bleeding. Return to the emergency department

## 2016-06-15 DIAGNOSIS — N400 Enlarged prostate without lower urinary tract symptoms: Secondary | ICD-10-CM | POA: Diagnosis not present

## 2016-06-15 DIAGNOSIS — N401 Enlarged prostate with lower urinary tract symptoms: Secondary | ICD-10-CM | POA: Diagnosis not present

## 2016-07-11 DIAGNOSIS — E039 Hypothyroidism, unspecified: Secondary | ICD-10-CM | POA: Diagnosis not present

## 2016-07-11 DIAGNOSIS — N2581 Secondary hyperparathyroidism of renal origin: Secondary | ICD-10-CM | POA: Diagnosis not present

## 2016-07-11 DIAGNOSIS — N183 Chronic kidney disease, stage 3 (moderate): Secondary | ICD-10-CM | POA: Diagnosis not present

## 2016-07-11 DIAGNOSIS — I471 Supraventricular tachycardia: Secondary | ICD-10-CM | POA: Diagnosis not present

## 2016-07-11 DIAGNOSIS — E041 Nontoxic single thyroid nodule: Secondary | ICD-10-CM | POA: Diagnosis not present

## 2016-07-11 DIAGNOSIS — M1990 Unspecified osteoarthritis, unspecified site: Secondary | ICD-10-CM | POA: Diagnosis not present

## 2016-07-11 DIAGNOSIS — E782 Mixed hyperlipidemia: Secondary | ICD-10-CM | POA: Diagnosis not present

## 2016-07-11 DIAGNOSIS — D631 Anemia in chronic kidney disease: Secondary | ICD-10-CM | POA: Diagnosis not present

## 2016-07-11 DIAGNOSIS — I129 Hypertensive chronic kidney disease with stage 1 through stage 4 chronic kidney disease, or unspecified chronic kidney disease: Secondary | ICD-10-CM | POA: Diagnosis not present

## 2016-08-18 ENCOUNTER — Other Ambulatory Visit: Payer: Self-pay | Admitting: Endocrinology

## 2016-10-27 ENCOUNTER — Other Ambulatory Visit: Payer: Self-pay | Admitting: Endocrinology

## 2016-12-15 ENCOUNTER — Ambulatory Visit (INDEPENDENT_AMBULATORY_CARE_PROVIDER_SITE_OTHER): Payer: Medicare Other | Admitting: Endocrinology

## 2016-12-15 ENCOUNTER — Encounter: Payer: Self-pay | Admitting: Endocrinology

## 2016-12-15 VITALS — BP 148/82 | HR 63 | Ht 70.5 in | Wt 173.8 lb

## 2016-12-15 DIAGNOSIS — I10 Essential (primary) hypertension: Secondary | ICD-10-CM | POA: Diagnosis not present

## 2016-12-15 DIAGNOSIS — Z125 Encounter for screening for malignant neoplasm of prostate: Secondary | ICD-10-CM | POA: Diagnosis not present

## 2016-12-15 DIAGNOSIS — E559 Vitamin D deficiency, unspecified: Secondary | ICD-10-CM | POA: Diagnosis not present

## 2016-12-15 DIAGNOSIS — Z0000 Encounter for general adult medical examination without abnormal findings: Secondary | ICD-10-CM | POA: Diagnosis not present

## 2016-12-15 DIAGNOSIS — E785 Hyperlipidemia, unspecified: Secondary | ICD-10-CM | POA: Diagnosis not present

## 2016-12-15 DIAGNOSIS — E876 Hypokalemia: Secondary | ICD-10-CM | POA: Diagnosis not present

## 2016-12-15 DIAGNOSIS — N289 Disorder of kidney and ureter, unspecified: Secondary | ICD-10-CM

## 2016-12-15 DIAGNOSIS — E039 Hypothyroidism, unspecified: Secondary | ICD-10-CM | POA: Diagnosis not present

## 2016-12-15 DIAGNOSIS — R739 Hyperglycemia, unspecified: Secondary | ICD-10-CM

## 2016-12-15 DIAGNOSIS — Z23 Encounter for immunization: Secondary | ICD-10-CM

## 2016-12-15 DIAGNOSIS — R972 Elevated prostate specific antigen [PSA]: Secondary | ICD-10-CM | POA: Diagnosis not present

## 2016-12-15 LAB — CBC WITH DIFFERENTIAL/PLATELET
BASOS ABS: 0 10*3/uL (ref 0.0–0.1)
Basophils Relative: 0.6 % (ref 0.0–3.0)
EOS ABS: 0.7 10*3/uL (ref 0.0–0.7)
Eosinophils Relative: 8.7 % — ABNORMAL HIGH (ref 0.0–5.0)
HEMATOCRIT: 41.2 % (ref 39.0–52.0)
Hemoglobin: 13.6 g/dL (ref 13.0–17.0)
LYMPHS PCT: 27.3 % (ref 12.0–46.0)
Lymphs Abs: 2.3 10*3/uL (ref 0.7–4.0)
MCHC: 33 g/dL (ref 30.0–36.0)
MCV: 87.1 fl (ref 78.0–100.0)
MONO ABS: 0.5 10*3/uL (ref 0.1–1.0)
Monocytes Relative: 6.6 % (ref 3.0–12.0)
NEUTROS ABS: 4.7 10*3/uL (ref 1.4–7.7)
NEUTROS PCT: 56.8 % (ref 43.0–77.0)
PLATELETS: 220 10*3/uL (ref 150.0–400.0)
RBC: 4.73 Mil/uL (ref 4.22–5.81)
RDW: 15.6 % — ABNORMAL HIGH (ref 11.5–15.5)
WBC: 8.3 10*3/uL (ref 4.0–10.5)

## 2016-12-15 LAB — URINALYSIS, ROUTINE W REFLEX MICROSCOPIC
BILIRUBIN URINE: NEGATIVE
Hgb urine dipstick: NEGATIVE
KETONES UR: NEGATIVE
LEUKOCYTES UA: NEGATIVE
Nitrite: NEGATIVE
PH: 6 (ref 5.0–8.0)
RBC / HPF: NONE SEEN (ref 0–?)
SPECIFIC GRAVITY, URINE: 1.025 (ref 1.000–1.030)
TOTAL PROTEIN, URINE-UPE24: 30 — AB
URINE GLUCOSE: NEGATIVE
UROBILINOGEN UA: 0.2 (ref 0.0–1.0)

## 2016-12-15 LAB — LIPID PANEL
CHOL/HDL RATIO: 6
Cholesterol: 182 mg/dL (ref 0–200)
HDL: 28.8 mg/dL — ABNORMAL LOW (ref >39.00)
LDL CALC: 115 mg/dL — AB (ref 0–99)
NONHDL: 152.84
Triglycerides: 189 mg/dL — ABNORMAL HIGH (ref 0.0–149.0)
VLDL: 37.8 mg/dL (ref 0.0–40.0)

## 2016-12-15 LAB — TSH: TSH: 2.17 u[IU]/mL (ref 0.35–4.50)

## 2016-12-15 LAB — BASIC METABOLIC PANEL
BUN: 32 mg/dL — ABNORMAL HIGH (ref 6–23)
CO2: 28 mEq/L (ref 19–32)
Calcium: 9.4 mg/dL (ref 8.4–10.5)
Chloride: 104 mEq/L (ref 96–112)
Creatinine, Ser: 1.51 mg/dL — ABNORMAL HIGH (ref 0.40–1.50)
GFR: 46.8 mL/min — AB (ref >60.00)
Glucose, Bld: 100 mg/dL — ABNORMAL HIGH (ref 70–99)
Potassium: 4 mEq/L (ref 3.5–5.1)
Sodium: 138 mEq/L (ref 135–145)

## 2016-12-15 LAB — HEMOGLOBIN A1C: HEMOGLOBIN A1C: 6.3 % (ref 4.6–6.5)

## 2016-12-15 LAB — VITAMIN D 25 HYDROXY (VIT D DEFICIENCY, FRACTURES): VITD: 14.98 ng/mL — ABNORMAL LOW (ref 30.00–100.00)

## 2016-12-15 LAB — HEPATIC FUNCTION PANEL
ALT: 9 U/L (ref 0–53)
AST: 13 U/L (ref 0–37)
Albumin: 3.9 g/dL (ref 3.5–5.2)
Alkaline Phosphatase: 115 U/L (ref 39–117)
Bilirubin, Direct: 0.1 mg/dL (ref 0.0–0.3)
TOTAL PROTEIN: 7.3 g/dL (ref 6.0–8.3)
Total Bilirubin: 0.7 mg/dL (ref 0.2–1.2)

## 2016-12-15 LAB — PSA: PSA: 5.62 ng/mL — AB (ref 0.10–4.00)

## 2016-12-15 MED ORDER — ERGOCALCIFEROL 1.25 MG (50000 UT) PO CAPS: 50000.0000 [IU] | ORAL_CAPSULE | ORAL | 0 refills | Status: DC

## 2016-12-15 MED ORDER — METOPROLOL SUCCINATE ER 25 MG PO TB24: 12.5000 mg | ORAL_TABLET | Freq: Every day | ORAL | 3 refills | Status: DC

## 2016-12-15 MED ORDER — DICLOFENAC SODIUM 1 % TD GEL: 4.0000 g | Freq: Four times a day (QID) | TRANSDERMAL | 11 refills | Status: DC

## 2016-12-15 NOTE — Patient Instructions (Signed)
blood tests are requested for you today.  We'll let you know about the results. Please consider these measures for your health:  minimize alcohol.  Do not use tobacco products.  Have a colonoscopy at least every 10 years from age 83.  Women should have an annual mammogram from age 85.  Keep firearms safely stored.  Always use seat belts.  have working smoke alarms in your home.  See an eye doctor and dentist regularly.  Never drive under the influence of alcohol or drugs (including prescription drugs).  Those with fair skin should take precautions against the sun, and should carefully examine their skin once per month, for any new or changed moles. It is critically important to prevent falling down (keep floor areas well-lit, dry, and free of loose objects.  If you have a cane, walker, or wheelchair, you should use it, even for short trips around the house.  Wear flat-soled shoes.  Also, try not to rush) good diet and exercise significantly improve your health.  please let me know if you wish to be referred to a dietician.  high blood sugar is very risky to your health.  you should see an eye doctor and dentist every year.  It is very important to get all recommended vaccinations.  Please reduce the metoprolol to 1/2 pill per day Please return in 1 year.

## 2016-12-15 NOTE — Progress Notes (Signed)
Subjective:    Patient ID: Brendan Long, male    DOB: 01-26-1929, 83 y.o.   MRN: [id number]  HPI  The state of at least three ongoing medical problems is addressed today, with interval history of each noted here: Knee pain: voltaren gel works well.   Dyslipidemia: he denies chest pain.   Vit-D deficiency: he has leg cramps.  Past Medical History:  Diagnosis Date  . ED (erectile dysfunction)   . Hyperlipidemia   . Hypertension   . Hypothyroidism   . Psoriasis   . Varicose veins   . Vitamin D deficiency     Past Surgical History:  Procedure Laterality Date  . JOINT REPLACEMENT    . joint replacement rt and left    . rotator cuff repair, right shoulder per Dr. Tonita Cong  1996  . torn right bicepts muscle  1996  . TOTAL HIP ARTHROPLASTY  01/17/10   redone on right hip on 09/05/10    Social History   Social History  . Marital status: Widowed    Spouse name: N/A  . Number of children: N/A  . Years of education: N/A   Occupational History  . Not on file.   Social History Main Topics  . Smoking status: Former Research scientist (life sciences)  . Smokeless tobacco: Never Used  . Alcohol use No  . Drug use: No  . Sexual activity: Yes   Other Topics Concern  . Not on file   Social History Narrative  . No narrative on file    Current Outpatient Prescriptions on File Prior to Visit  Medication Sig Dispense Refill  . aspirin 325 MG tablet Take 325 mg by mouth daily.    Marland Kitchen atorvastatin (LIPITOR) 80 MG tablet TAKE 1 TABLET DAILY 90 tablet 2  . clobetasol (TEMOVATE) 0.05 % ointment Apply topically 2 (two) times daily.      . ergocalciferol (VITAMIN D2) 50000 units capsule Take 1 capsule (50,000 Units total) by mouth 2 (two) times a week. 10 capsule 0  . fenofibrate (TRICOR) 48 MG tablet TAKE 1 TABLET DAILY 90 tablet 2  . hydrochlorothiazide (HYDRODIURIL) 12.5 MG tablet TAKE 1 TABLET DAILY 90 tablet 2  . niacin 500 MG tablet Take 500 mg by mouth daily.    . sildenafil (VIAGRA) 100 MG tablet Take 1  tablet (100 mg total) by mouth as needed for erectile dysfunction. Take 1 one hour before needed 10 tablet 11  . omega-3 acid ethyl esters (LOVAZA) 1 G capsule Take 2 capsules (2 g total) by mouth 2 (two) times daily. 120 capsule 11   No current facility-administered medications on file prior to visit.     No Known Allergies  Family History  Problem Relation Age of Onset  . Breast cancer Unknown     BP (!) 148/82 (BP Location: Right Arm, Patient Position: Sitting, Cuff Size: Normal)   Pulse 63   Ht 5' 10.5" (1.791 m)   Wt 173 lb 12.8 oz (78.8 kg)   SpO2 94%   BMI 24.59 kg/m   Review of Systems Denies sob and edema    Objective:   Physical Exam VS: see vs page GEN: no distress HEAD: head: no deformity eyes: no periorbital swelling, no proptosis.   external nose and ears are normal mouth: no lesion seen NECK: supple, thyroid is not enlarged CHEST WALL: no deformity LUNGS: clear to auscultation CV: reg rate and rhythm, no murmur ABD: abdomen is soft, nontender.  no hepatosplenomegaly.  not distended.  no  hernia MUSCULOSKELETAL: muscle bulk and strength are grossly normal.  no obvious joint swelling.  gait is normal and steady.   EXTEMITIES: no deformity.  no edema PULSES: no carotid bruit NEURO:  cn 2-12 grossly intact.   readily moves all 4's.  sensation is intact to touch on all 4's.  SKIN:  Normal texture and temperature.  No rash or suspicious lesion is visible.   NODES:  None palpable at the neck.  PSYCH: alert, well-oriented.  Does not appear anxious nor depressed.   Sinus  Rhythm  -First degree A-V block. PRi = 258 Right bundle branch block.   25-OH vit-D=15  Lab Results  Component Value Date   CHOL 182 12/15/2016   HDL 28.80 (L) 12/15/2016   LDLCALC 115 (H) 12/15/2016   LDLDIRECT 63.0 12/16/2014   TRIG 189.0 (H) 12/15/2016   CHOLHDL 6 12/15/2016      Assessment & Plan:  Knee pain: voltaren gel works well.  Please continue the same medication.    Dyslipidemia: OK control for age.  Please continue the same medication. Vit-D deficiency: he needs another course of rx.  I have sent a prescription to your pharmacy.    Subjective:   Patient here for Medicare annual wellness visit and management of other chronic and acute problems.     Risk factors: advanced age    86 of Physicians Providing Medical Care to Patient:  See "snapshot"   Activities of Daily Living: In your present state of health, do you have any difficulty performing the following activities (lives alone)?:  Preparing food and eating?: No  Bathing yourself: No  Getting dressed: No  Using the toilet:No  Moving around from place to place: No  In the past year have you fallen or had a near fall?: No    Home Safety: Has smoke detector and wears seat belts. No firearms. No excess sun exposure.   Diet and Exercise  Current exercise habits: pt says good Dietary issues discussed: pt reports a healthy diet   Depression Screen  Q1: Over the past two weeks, have you felt down, depressed or hopeless? no  Q2: Over the past two weeks, have you felt little interest or pleasure in doing things? no   The following portions of the patient's history were reviewed and updated as appropriate: allergies, current medications, past family history, past medical history, past social history, past surgical history and problem list.   Review of Systems  Denies hearing loss, and visual loss Objective:   Vision:  Sees optometrist, so he declines [facility] today Hearing: grossly normal Body mass index:  See vs page Msk: pt easily and quickly performs "get-up-and-go" from a sitting position Cognitive Impairment Assessment: cognition, memory and judgment appear normal.  remembers 3/3 at 5 minutes.  excellent recall.  can easily read and write a sentence.  alert and oriented x 3.    Assessment:   Medicare wellness utd on preventive parameters.     Plan:   During the course of the visit  the patient was educated and counseled about appropriate screening and preventive services including:        Fall prevention   Screening mammography  Bone densitometry screening  Diabetes screening  Nutrition counseling   Vaccines / LABS Zostavax / Pneumococcal Vaccine  today  PSA  Patient Instructions (the written plan) was given to the patient.

## 2017-02-23 ENCOUNTER — Other Ambulatory Visit: Payer: Self-pay | Admitting: Endocrinology

## 2017-02-23 DIAGNOSIS — N529 Male erectile dysfunction, unspecified: Secondary | ICD-10-CM

## 2017-03-13 DIAGNOSIS — D631 Anemia in chronic kidney disease: Secondary | ICD-10-CM | POA: Diagnosis not present

## 2017-03-13 DIAGNOSIS — I129 Hypertensive chronic kidney disease with stage 1 through stage 4 chronic kidney disease, or unspecified chronic kidney disease: Secondary | ICD-10-CM | POA: Diagnosis not present

## 2017-03-13 DIAGNOSIS — E041 Nontoxic single thyroid nodule: Secondary | ICD-10-CM | POA: Diagnosis not present

## 2017-03-13 DIAGNOSIS — E782 Mixed hyperlipidemia: Secondary | ICD-10-CM | POA: Diagnosis not present

## 2017-03-13 DIAGNOSIS — N183 Chronic kidney disease, stage 3 (moderate): Secondary | ICD-10-CM | POA: Diagnosis not present

## 2017-03-13 DIAGNOSIS — M1990 Unspecified osteoarthritis, unspecified site: Secondary | ICD-10-CM | POA: Diagnosis not present

## 2017-03-13 DIAGNOSIS — E039 Hypothyroidism, unspecified: Secondary | ICD-10-CM | POA: Diagnosis not present

## 2017-03-13 DIAGNOSIS — N2581 Secondary hyperparathyroidism of renal origin: Secondary | ICD-10-CM | POA: Diagnosis not present

## 2017-03-13 DIAGNOSIS — I471 Supraventricular tachycardia: Secondary | ICD-10-CM | POA: Diagnosis not present

## 2017-04-17 DIAGNOSIS — N183 Chronic kidney disease, stage 3 (moderate): Secondary | ICD-10-CM | POA: Diagnosis not present

## 2017-07-24 ENCOUNTER — Other Ambulatory Visit: Payer: Self-pay | Admitting: Endocrinology

## 2017-10-15 DIAGNOSIS — M1990 Unspecified osteoarthritis, unspecified site: Secondary | ICD-10-CM | POA: Diagnosis not present

## 2017-10-15 DIAGNOSIS — E782 Mixed hyperlipidemia: Secondary | ICD-10-CM | POA: Diagnosis not present

## 2017-10-15 DIAGNOSIS — I471 Supraventricular tachycardia: Secondary | ICD-10-CM | POA: Diagnosis not present

## 2017-10-15 DIAGNOSIS — N2581 Secondary hyperparathyroidism of renal origin: Secondary | ICD-10-CM | POA: Diagnosis not present

## 2017-10-15 DIAGNOSIS — E039 Hypothyroidism, unspecified: Secondary | ICD-10-CM | POA: Diagnosis not present

## 2017-10-15 DIAGNOSIS — I129 Hypertensive chronic kidney disease with stage 1 through stage 4 chronic kidney disease, or unspecified chronic kidney disease: Secondary | ICD-10-CM | POA: Diagnosis not present

## 2017-10-15 DIAGNOSIS — D631 Anemia in chronic kidney disease: Secondary | ICD-10-CM | POA: Diagnosis not present

## 2017-10-15 DIAGNOSIS — N183 Chronic kidney disease, stage 3 (moderate): Secondary | ICD-10-CM | POA: Diagnosis not present

## 2017-10-15 DIAGNOSIS — L409 Psoriasis, unspecified: Secondary | ICD-10-CM | POA: Diagnosis not present

## 2017-11-21 ENCOUNTER — Ambulatory Visit (INDEPENDENT_AMBULATORY_CARE_PROVIDER_SITE_OTHER): Payer: Medicare Other | Admitting: Endocrinology

## 2017-11-21 ENCOUNTER — Ambulatory Visit
Admission: RE | Admit: 2017-11-21 | Discharge: 2017-11-21 | Disposition: A | Discharge status: Home / Self Care | Payer: Medicare Other | Source: Ambulatory Visit | Attending: Endocrinology | Admitting: Endocrinology

## 2017-11-21 ENCOUNTER — Ambulatory Visit: Payer: Medicare Other | Admitting: Endocrinology

## 2017-11-21 ENCOUNTER — Encounter: Payer: Self-pay | Admitting: Endocrinology

## 2017-11-21 VITALS — BP 128/80 | HR 81 | Wt 173.2 lb

## 2017-11-21 DIAGNOSIS — R05 Cough: Secondary | ICD-10-CM

## 2017-11-21 DIAGNOSIS — R059 Cough, unspecified: Secondary | ICD-10-CM

## 2017-11-21 IMAGING — DX DG CHEST 2V
2 series · 2 of 2 positions shown · non-contrast
Comparison: Chest radiograph performed [DATE]

CLINICAL DATA: Acute onset of cough and congestion.

EXAM:
CHEST - 2 VIEW

[dg chest 2 view (1 of 2)]
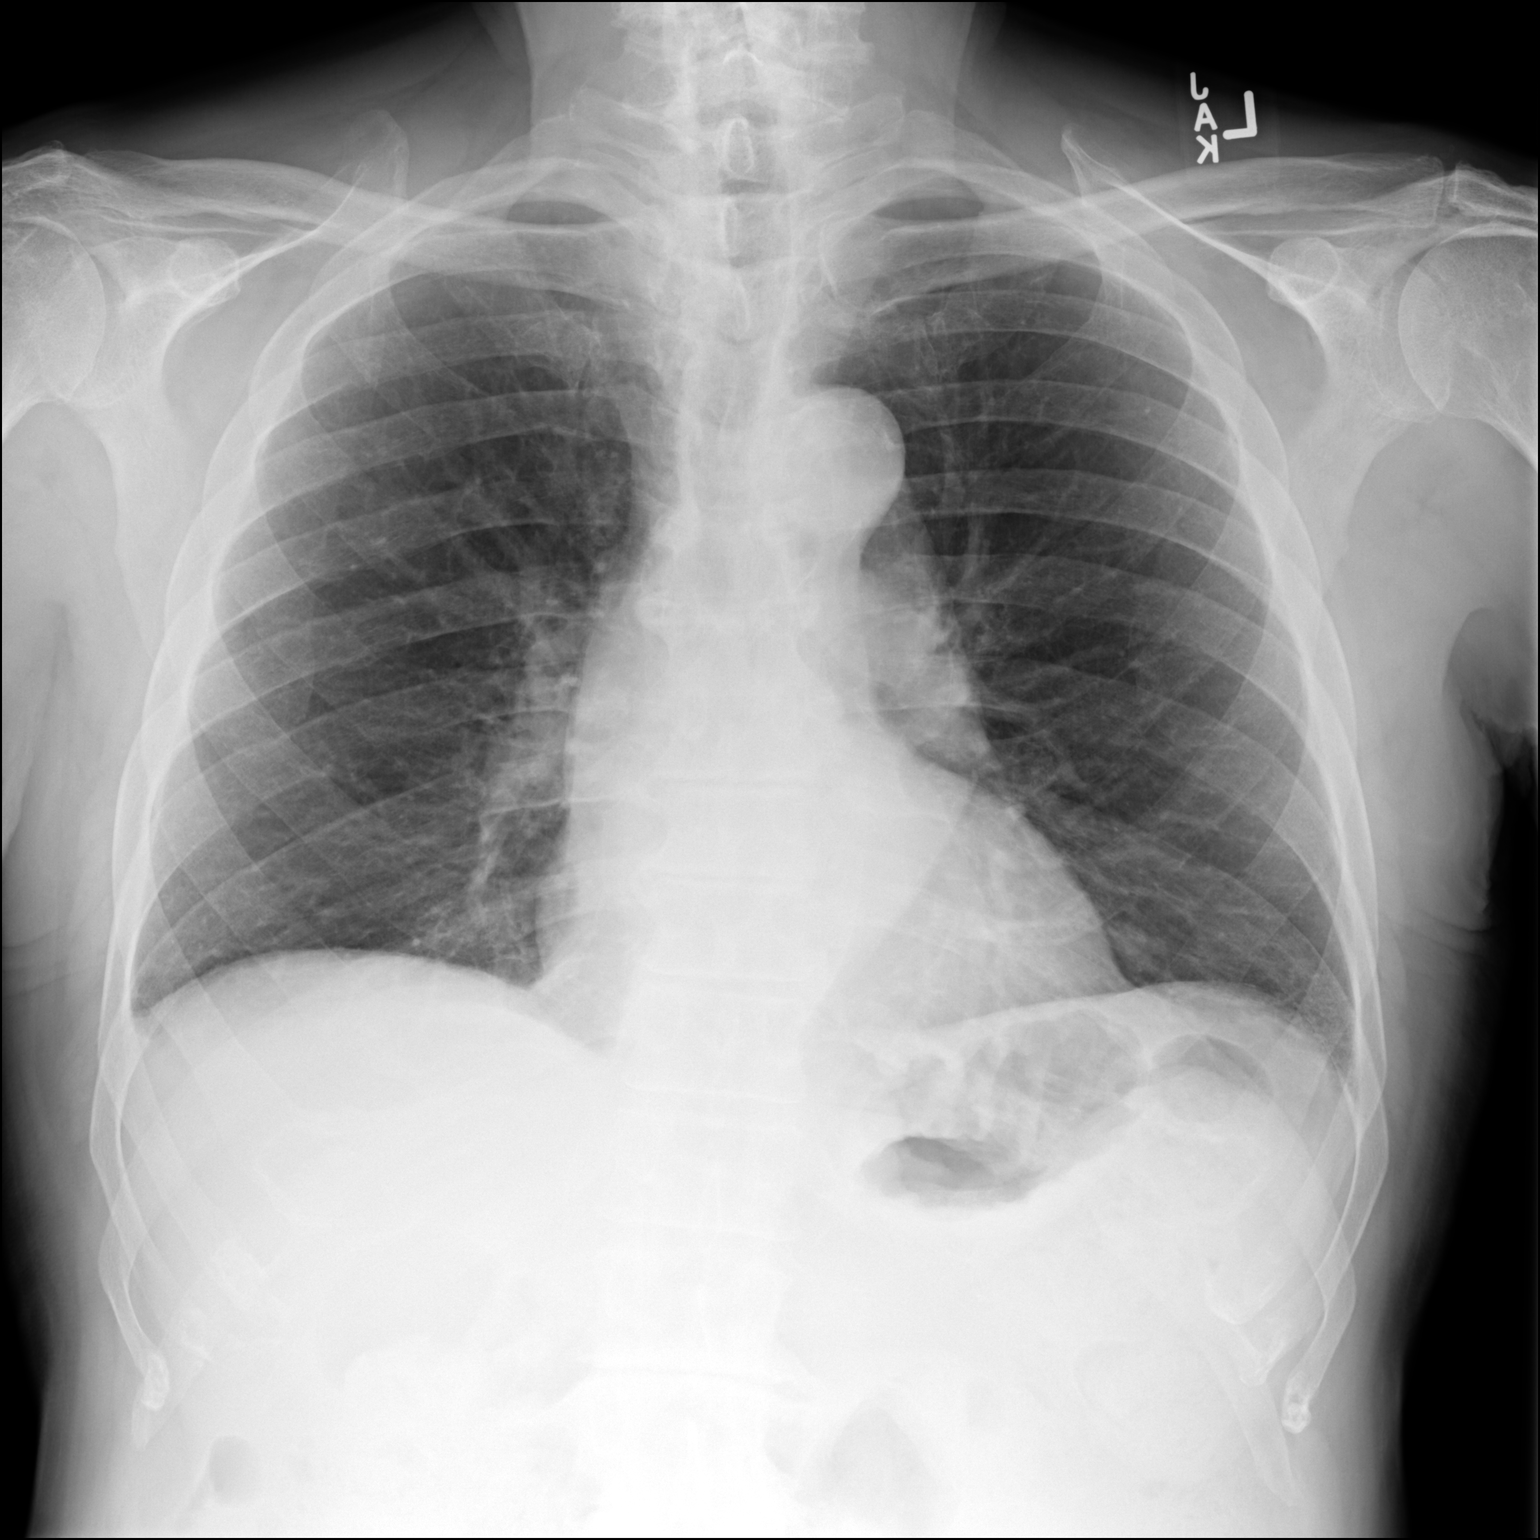

[dg chest 2 view (2 of 2)]
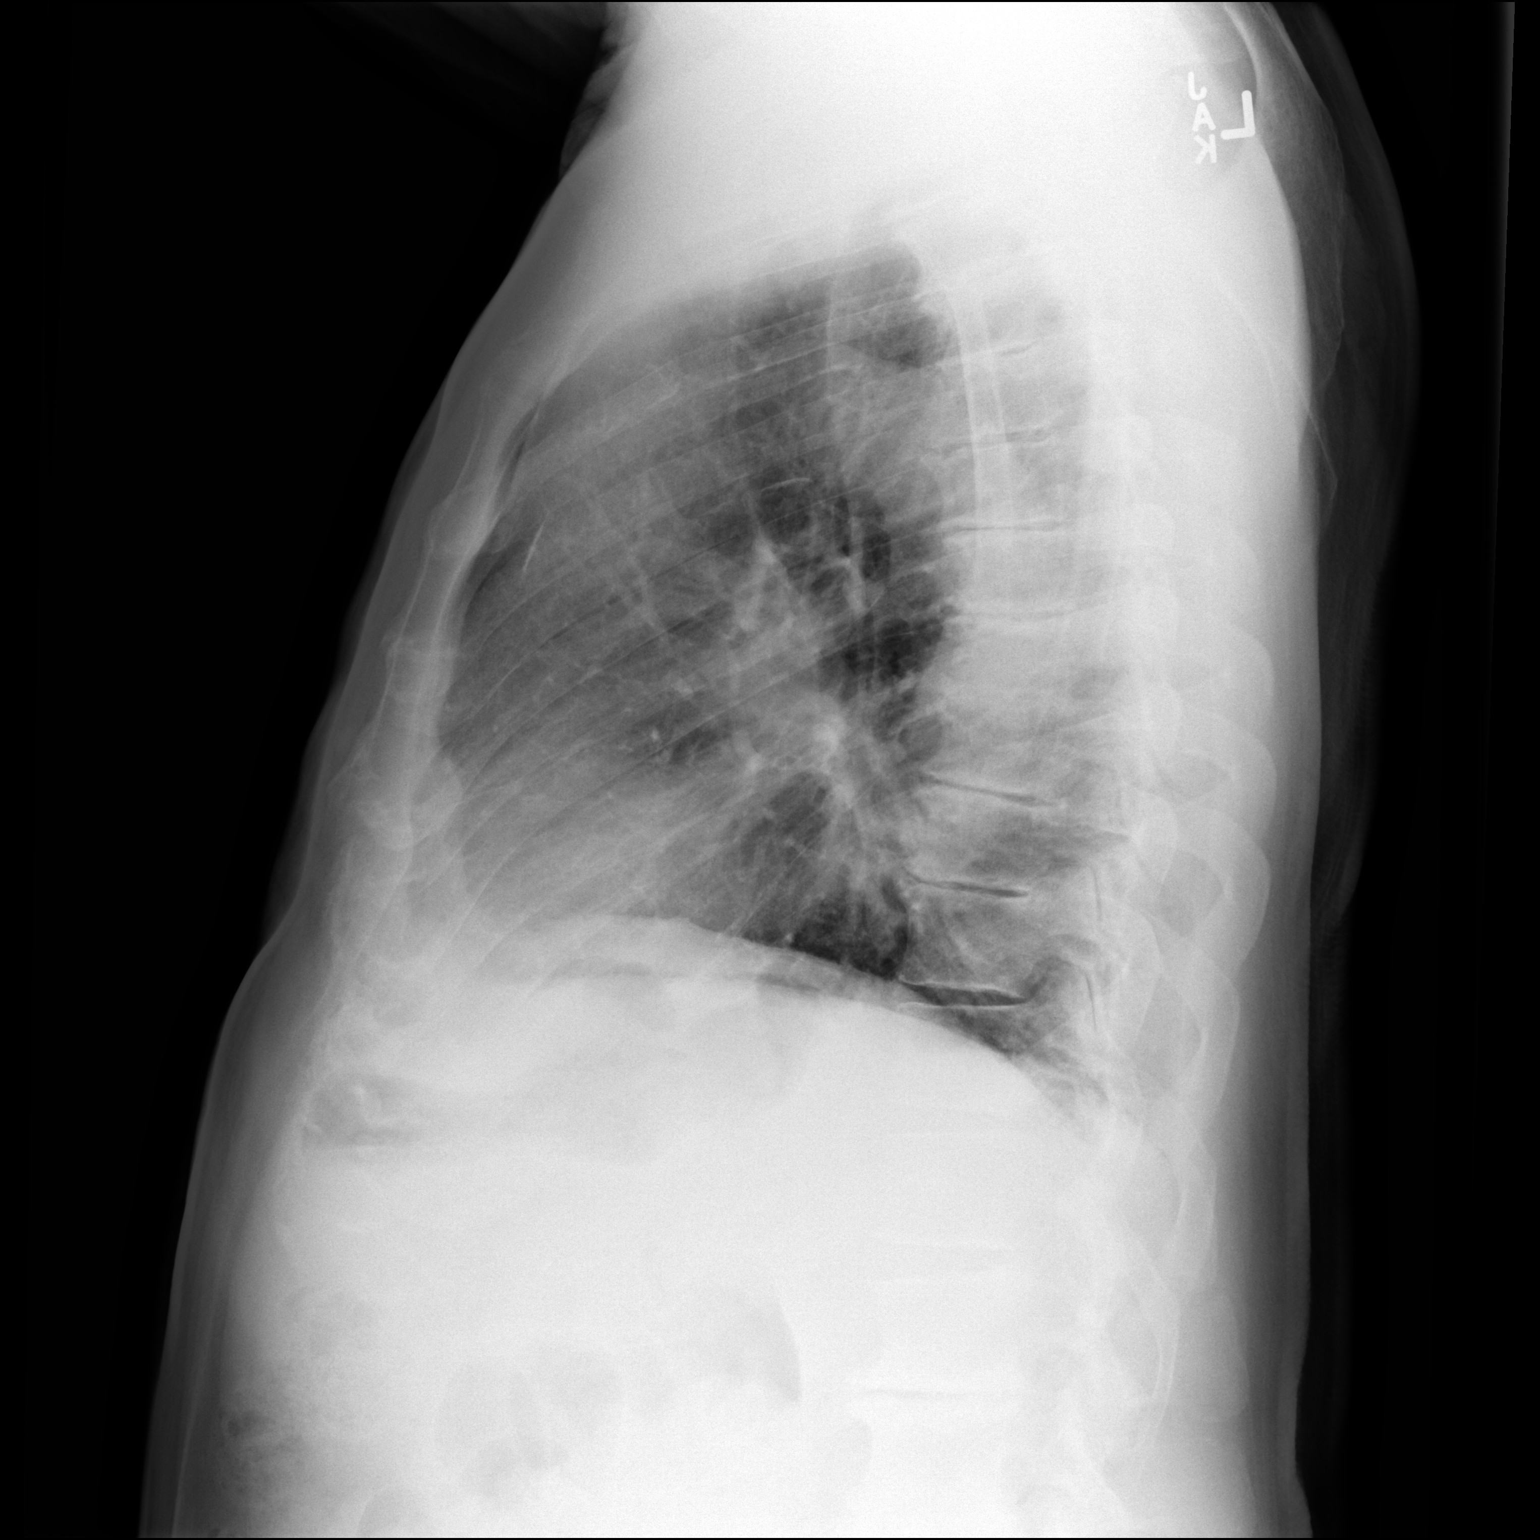

[2 of 2 positions shown; findings below may reference images not displayed]

FINDINGS: The lungs are well-aerated. Minimal left basilar atelectasis is
noted. There is no evidence of pleural effusion or pneumothorax.

The heart is normal in size; the mediastinal contour is within
normal limits. No acute osseous abnormalities are seen.
IMPRESSION: Minimal left basilar atelectasis noted; lungs otherwise clear.

## 2017-11-21 MED ORDER — FLUTICASONE-SALMETEROL 100-50 MCG/DOSE IN AEPB: 1.0000 | INHALATION_SPRAY | Freq: Two times a day (BID) | RESPIRATORY_TRACT | 1 refills | Status: DC

## 2017-11-21 MED ORDER — PROMETHAZINE-CODEINE 6.25-10 MG/5ML PO SYRP: 5.0000 mL | ORAL_SOLUTION | ORAL | 0 refills | Status: DC | PRN

## 2017-11-21 MED ORDER — AZITHROMYCIN 500 MG PO TABS: 500.0000 mg | ORAL_TABLET | Freq: Every day | ORAL | 0 refills | Status: DC

## 2017-11-21 NOTE — Patient Instructions (Addendum)
A chest x-ray is requested for you today.  We'll let you know about the results.    I have sent 3 prescriptions to your pharmacy: antibiotic pill, inhaler, and cough syrup. Loratadine-d (non-prescription) will help your congestion.  I hope you feel better soon.  If you don't feel better by next week, please call back.  Please call sooner if you get worse.

## 2017-11-21 NOTE — Progress Notes (Signed)
Subjective:    Patient ID: Brendan Long, male    DOB: Mar 09, 1929, 84 y.o.   MRN: [id number]  HPI 2 days of slight dry-quality cough in the chest, and assoc rhinorrhea.   Past Medical History:  Diagnosis Date  . ED (erectile dysfunction)   . Hyperlipidemia   . Hypertension   . Hypothyroidism   . Psoriasis   . Varicose veins   . Vitamin D deficiency     Past Surgical History:  Procedure Laterality Date  . JOINT REPLACEMENT    . joint replacement rt and left    . rotator cuff repair, right shoulder per Dr. Tonita Cong  1996  . torn right bicepts muscle  1996  . TOTAL HIP ARTHROPLASTY  01/17/10   redone on right hip on 09/05/10    Social History   Socioeconomic History  . Marital status: Widowed    Spouse name: Not on file  . Number of children: Not on file  . Years of education: Not on file  . Highest education level: Not on file  Occupational History  . Not on file  Social Needs  . Financial resource strain: Not on file  . Food insecurity:    Worry: Not on file    Inability: Not on file  . Transportation needs:    Medical: Not on file    Non-medical: Not on file  Tobacco Use  . Smoking status: Former Research scientist (life sciences)  . Smokeless tobacco: Never Used  Substance and Sexual Activity  . Alcohol use: No  . Drug use: No  . Sexual activity: Yes  Lifestyle  . Physical activity:    Days per week: Not on file    Minutes per session: Not on file  . Stress: Not on file  Relationships  . Social connections:    Talks on phone: Not on file    Gets together: Not on file    Attends religious service: Not on file    Active member of club or organization: Not on file    Attends meetings of clubs or organizations: Not on file    Relationship status: Not on file  . Intimate partner violence:    Fear of current or ex partner: Not on file    Emotionally abused: Not on file    Physically abused: Not on file    Forced sexual activity: Not on file  Other Topics Concern  . Not on file    Social History Narrative  . Not on file    Current Outpatient Medications on File Prior to Visit  Medication Sig Dispense Refill  . aspirin 325 MG tablet Take 325 mg by mouth daily.    Marland Kitchen atorvastatin (LIPITOR) 80 MG tablet TAKE 1 TABLET DAILY 90 tablet 2  . clobetasol (TEMOVATE) 0.05 % ointment Apply topically 2 (two) times daily.      . diclofenac sodium (VOLTAREN) 1 % GEL Apply 4 g topically 4 (four) times daily. As needed for knee pain 200 g 11  . hydrochlorothiazide (HYDRODIURIL) 12.5 MG tablet TAKE 1 TABLET DAILY 90 tablet 2  . metoprolol succinate (TOPROL-XL) 25 MG 24 hr tablet Take 0.5 tablets (12.5 mg total) by mouth daily. 45 tablet 3  . niacin 500 MG tablet Take 500 mg by mouth daily.    . TRICOR 48 MG tablet TAKE 1 TABLET DAILY 90 tablet 2  . VIAGRA 100 MG tablet TAKE 1 TABLET AS NEEDED ONE HOUR BEFORE NEEDED FOR ERECTILE DYSFUNCTION 10 tablet 11  .  omega-3 acid ethyl esters (LOVAZA) 1 G capsule Take 2 capsules (2 g total) by mouth 2 (two) times daily. 120 capsule 11   No current facility-administered medications on file prior to visit.     No Known Allergies  Family History  Problem Relation Age of Onset  . Breast cancer Unknown     BP 128/80 (BP Location: Left Arm, Patient Position: Standing, Cuff Size: Normal)   Pulse 81   Wt 173 lb 3.2 oz (78.6 kg)   SpO2 96%   BMI 24.50 kg/m    Review of Systems He has bilat eye tearing and wheezing.  No fever, sob, or earache.     Objective:   Physical Exam VITAL SIGNS:  See vs page GENERAL: no distress head: no deformity  eyes: no periorbital swelling, no proptosis  external nose and ears are normal.   mouth: no lesion seen Right TM is red (left is normal).  LUNGS:  Clear to auscultation, except for rales at the right base.        Assessment & Plan:  Cough, new, uncertain etiology Rales: he should have CXR HTN: well-controlled.  Same medications Wheezing, poss exac by B-blocked: we'll reduce if it  persists  Patient Instructions  A chest x-ray is requested for you today.  We'll let you know about the results.    I have sent 3 prescriptions to your pharmacy: antibiotic pill, inhaler, and cough syrup. Loratadine-d (non-prescription) will help your congestion.  I hope you feel better soon.  If you don't feel better by next week, please call back.  Please call sooner if you get worse.

## 2017-11-26 ENCOUNTER — Telehealth: Payer: Self-pay | Admitting: *Deleted

## 2017-11-26 NOTE — Telephone Encounter (Signed)
Initiated PA for Advair 100/50 via CoverMyMeds. Received outcome back stating:  Additional Information Required Drug is covered by current benefit plan. No further PA activity needed.

## 2017-11-27 ENCOUNTER — Telehealth: Payer: Self-pay | Admitting: *Deleted

## 2017-11-27 NOTE — Telephone Encounter (Signed)
error 

## 2017-12-02 ENCOUNTER — Other Ambulatory Visit: Payer: Self-pay | Admitting: Endocrinology

## 2017-12-02 MED ORDER — BUDESONIDE-FORMOTEROL FUMARATE 80-4.5 MCG/ACT IN AERO: 2.0000 | INHALATION_SPRAY | Freq: Every day | RESPIRATORY_TRACT | 1 refills | Status: DC

## 2017-12-03 ENCOUNTER — Other Ambulatory Visit: Payer: Self-pay

## 2017-12-03 MED ORDER — BUDESONIDE-FORMOTEROL FUMARATE 80-4.5 MCG/ACT IN AERO: 2.0000 | INHALATION_SPRAY | Freq: Two times a day (BID) | RESPIRATORY_TRACT | 3 refills | Status: DC

## 2017-12-14 ENCOUNTER — Encounter: Payer: Self-pay | Admitting: Endocrinology

## 2017-12-14 ENCOUNTER — Ambulatory Visit (INDEPENDENT_AMBULATORY_CARE_PROVIDER_SITE_OTHER): Payer: Medicare Other | Admitting: Endocrinology

## 2017-12-14 ENCOUNTER — Other Ambulatory Visit: Payer: Self-pay

## 2017-12-14 VITALS — BP 134/68 | HR 63 | Wt 176.0 lb

## 2017-12-14 DIAGNOSIS — Z0000 Encounter for general adult medical examination without abnormal findings: Secondary | ICD-10-CM

## 2017-12-14 MED ORDER — CLOBETASOL PROPIONATE 0.05 % EX OINT: TOPICAL_OINTMENT | Freq: Two times a day (BID) | CUTANEOUS | 0 refills | Status: DC

## 2017-12-14 NOTE — Progress Notes (Signed)
   Subjective:    Patient ID: Brendan Long, male    DOB: June 09, 1929, 84 y.o.   MRN: [id number]  HPI Pt is 1 day early for wellness visit.  He will reschedule   Review of Systems     Objective:   Physical Exam        Assessment & Plan:

## 2017-12-14 NOTE — Patient Instructions (Signed)
Please come back soon for your annual wellness visit.

## 2018-02-01 ENCOUNTER — Ambulatory Visit (INDEPENDENT_AMBULATORY_CARE_PROVIDER_SITE_OTHER): Payer: Medicare Other | Admitting: Endocrinology

## 2018-02-01 ENCOUNTER — Encounter: Payer: Self-pay | Admitting: Endocrinology

## 2018-02-01 VITALS — BP 114/84 | HR 74 | Ht 71.0 in | Wt 176.0 lb

## 2018-02-01 DIAGNOSIS — E785 Hyperlipidemia, unspecified: Secondary | ICD-10-CM | POA: Diagnosis not present

## 2018-02-01 DIAGNOSIS — I10 Essential (primary) hypertension: Secondary | ICD-10-CM

## 2018-02-01 DIAGNOSIS — E039 Hypothyroidism, unspecified: Secondary | ICD-10-CM

## 2018-02-01 DIAGNOSIS — R972 Elevated prostate specific antigen [PSA]: Secondary | ICD-10-CM | POA: Diagnosis not present

## 2018-02-01 DIAGNOSIS — N289 Disorder of kidney and ureter, unspecified: Secondary | ICD-10-CM

## 2018-02-01 DIAGNOSIS — R739 Hyperglycemia, unspecified: Secondary | ICD-10-CM

## 2018-02-01 DIAGNOSIS — E559 Vitamin D deficiency, unspecified: Secondary | ICD-10-CM | POA: Diagnosis not present

## 2018-02-01 DIAGNOSIS — Z0000 Encounter for general adult medical examination without abnormal findings: Secondary | ICD-10-CM

## 2018-02-01 LAB — LIPID PANEL
Cholesterol: 190 mg/dL (ref 0–200)
HDL: 31.2 mg/dL — ABNORMAL LOW (ref >39.00)
NONHDL: 158.73
TRIGLYCERIDES: 221 mg/dL — AB (ref 0.0–149.0)
Total CHOL/HDL Ratio: 6
VLDL: 44.2 mg/dL — ABNORMAL HIGH (ref 0.0–40.0)

## 2018-02-01 LAB — BASIC METABOLIC PANEL
BUN: 27 mg/dL — AB (ref 6–23)
CALCIUM: 9.5 mg/dL (ref 8.4–10.5)
CO2: 31 meq/L (ref 19–32)
CREATININE: 1.56 mg/dL — AB (ref 0.40–1.50)
Chloride: 103 mEq/L (ref 96–112)
GFR: 44.95 mL/min — ABNORMAL LOW (ref >60.00)
Glucose, Bld: 97 mg/dL (ref 70–99)
Potassium: 4 mEq/L (ref 3.5–5.1)
SODIUM: 140 meq/L (ref 135–145)

## 2018-02-01 LAB — CBC WITH DIFFERENTIAL/PLATELET
Basophils Absolute: 0.1 10*3/uL (ref 0.0–0.1)
Basophils Relative: 0.7 % (ref 0.0–3.0)
EOS PCT: 9.1 % — AB (ref 0.0–5.0)
Eosinophils Absolute: 0.8 10*3/uL — ABNORMAL HIGH (ref 0.0–0.7)
HEMATOCRIT: 45.8 % (ref 39.0–52.0)
HEMOGLOBIN: 15.3 g/dL (ref 13.0–17.0)
LYMPHS ABS: 2.7 10*3/uL (ref 0.7–4.0)
LYMPHS PCT: 31.7 % (ref 12.0–46.0)
MCHC: 33.5 g/dL (ref 30.0–36.0)
MCV: 89.2 fl (ref 78.0–100.0)
MONOS PCT: 5.6 % (ref 3.0–12.0)
Monocytes Absolute: 0.5 10*3/uL (ref 0.1–1.0)
NEUTROS PCT: 52.9 % (ref 43.0–77.0)
Neutro Abs: 4.5 10*3/uL (ref 1.4–7.7)
Platelets: 219 10*3/uL (ref 150.0–400.0)
RBC: 5.13 Mil/uL (ref 4.22–5.81)
RDW: 14.8 % (ref 11.5–15.5)
WBC: 8.6 10*3/uL (ref 4.0–10.5)

## 2018-02-01 LAB — LDL CHOLESTEROL, DIRECT: Direct LDL: 110 mg/dL

## 2018-02-01 LAB — HEPATIC FUNCTION PANEL
ALBUMIN: 4.1 g/dL (ref 3.5–5.2)
ALK PHOS: 99 U/L (ref 39–117)
ALT: 10 U/L (ref 0–53)
AST: 13 U/L (ref 0–37)
Bilirubin, Direct: 0.2 mg/dL (ref 0.0–0.3)
TOTAL PROTEIN: 7.1 g/dL (ref 6.0–8.3)
Total Bilirubin: 1.1 mg/dL (ref 0.2–1.2)

## 2018-02-01 LAB — HEMOGLOBIN A1C: HEMOGLOBIN A1C: 6.2 % (ref 4.6–6.5)

## 2018-02-01 LAB — PSA: PSA: 5.28 ng/mL — ABNORMAL HIGH (ref 0.10–4.00)

## 2018-02-01 LAB — TSH: TSH: 2.11 u[IU]/mL (ref 0.35–4.50)

## 2018-02-01 NOTE — Progress Notes (Signed)
we discussed code status.  pt requests DNR 

## 2018-02-01 NOTE — Progress Notes (Signed)
Subjective:    Patient ID: Brendan Long, male    DOB: 04-27-1929, 84 y.o.   MRN: [id number]  HPI  The state of at least three ongoing medical problems is addressed today, with interval history of each noted here: Hyperglycemia: denies weight change.  This is a stable problem. 1D AVB is noted today: denies dizziness.  This is a stable problem. HTN: denies sob.  This is a stable problem. Past Medical History:  Diagnosis Date  . ED (erectile dysfunction)   . Hyperlipidemia   . Hypertension   . Hypothyroidism   . Psoriasis   . Varicose veins   . Vitamin D deficiency     Past Surgical History:  Procedure Laterality Date  . JOINT REPLACEMENT    . joint replacement rt and left    . rotator cuff repair, right shoulder per Dr. Tonita Cong  1996  . torn right bicepts muscle  1996  . TOTAL HIP ARTHROPLASTY  01/17/10   redone on right hip on 09/05/10    Social History   Socioeconomic History  . Marital status: Widowed    Spouse name: Not on file  . Number of children: Not on file  . Years of education: Not on file  . Highest education level: Not on file  Occupational History  . Not on file  Social Needs  . Financial resource strain: Not on file  . Food insecurity:    Worry: Not on file    Inability: Not on file  . Transportation needs:    Medical: Not on file    Non-medical: Not on file  Tobacco Use  . Smoking status: Former Research scientist (life sciences)  . Smokeless tobacco: Never Used  Substance and Sexual Activity  . Alcohol use: No  . Drug use: No  . Sexual activity: Yes  Lifestyle  . Physical activity:    Days per week: Not on file    Minutes per session: Not on file  . Stress: Not on file  Relationships  . Social connections:    Talks on phone: Not on file    Gets together: Not on file    Attends religious service: Not on file    Active member of club or organization: Not on file    Attends meetings of clubs or organizations: Not on file    Relationship status: Not on file  .  Intimate partner violence:    Fear of current or ex partner: Not on file    Emotionally abused: Not on file    Physically abused: Not on file    Forced sexual activity: Not on file  Other Topics Concern  . Not on file  Social History Narrative  . Not on file    Current Outpatient Medications on File Prior to Visit  Medication Sig Dispense Refill  . aspirin 325 MG tablet Take 325 mg by mouth daily.    Marland Kitchen atorvastatin (LIPITOR) 80 MG tablet TAKE 1 TABLET DAILY 90 tablet 2  . budesonide-formoterol (SYMBICORT) 80-4.5 MCG/ACT inhaler Inhale 2 puffs into the lungs 2 (two) times daily. 1 Inhaler 3  . clobetasol ointment (TEMOVATE) 0.05 % Apply topically 2 (two) times daily. 30 g 0  . diclofenac sodium (VOLTAREN) 1 % GEL Apply 4 g topically 4 (four) times daily. As needed for knee pain 200 g 11  . hydrochlorothiazide (HYDRODIURIL) 12.5 MG tablet TAKE 1 TABLET DAILY 90 tablet 2  . niacin 500 MG tablet Take 500 mg by mouth daily.    Marland Kitchen  omega-3 acid ethyl esters (LOVAZA) 1 G capsule Take 2 capsules (2 g total) by mouth 2 (two) times daily. 120 capsule 11  . TRICOR 48 MG tablet TAKE 1 TABLET DAILY 90 tablet 2  . VIAGRA 100 MG tablet TAKE 1 TABLET AS NEEDED ONE HOUR BEFORE NEEDED FOR ERECTILE DYSFUNCTION 10 tablet 11   No current facility-administered medications on file prior to visit.     No Known Allergies  Family History  Problem Relation Age of Onset  . Breast cancer Unknown     BP 114/84 (BP Location: Right Arm, Patient Position: Sitting, Cuff Size: Normal)   Pulse 74   Ht 5\' 11"  (1.803 m)   Wt 176 lb (79.8 kg)   SpO2 97%   BMI 24.55 kg/m   Review of Systems Denies chest pain and leg edema    Objective:   Physical Exam VITAL SIGNS:  See vs page GENERAL: no distress NECK: There is no palpable thyroid enlargement.  No thyroid nodule is palpable.  No palpable lymphadenopathy at the anterior neck. LUNGS:  Clear to auscultation HEART:  Regular rate and rhythm without murmurs  noted. Normal S1,S2.   CV: no leg edema   I personally reviewed electrocardiogram tracing (today): Indication: dyslipidemia Impression: NSR with 1D AVB.  No MI.  RBBB Compared to 2018: no significant change     Assessment & Plan:  Hyperglycemia: recheck today 1D AVB: d/c toprol HTN: well-controlled: Please continue the same medication   Subjective:   Patient here for Medicare annual wellness visit and management of other chronic and acute problems.     Risk factors: advanced age    62 of Physicians Providing Medical Care to Patient:  See "snapshot"   Activities of Daily Living: In your present state of health, do you have any difficulty performing the following activities (lives alone) ?:  Preparing food and eating?: No  Bathing yourself: No  Getting dressed: No  Using the toilet:No  Moving around from place to place: No  In the past year have you fallen or had a near fall?: No    Home Safety: Has smoke detector and wears seat belts. No firearms. No excess sun exposure.    Opioid Use: none   Diet and Exercise  Current exercise habits: pt says good Dietary issues discussed: pt reports a healthy diet.    Depression Screen  Q1: Over the past two weeks, have you felt down, depressed or hopeless? no  Q2: Over the past two weeks, have you felt little interest or pleasure in doing things? no   The following portions of the patient's history were reviewed and updated as appropriate: allergies, current medications, past family history, past medical history, past social history, past surgical history and problem list.   Review of Systems  Denies hearing loss, and visual loss Objective:   Vision:  See [facility] done today Hearing: grossly decreased from normal Body mass index:  See vs page Msk: pt slowly performs "get-up-and-go" from a sitting position.  Cognitive Impairment Assessment: cognition, memory and judgment appear normal.  remembers 3/3 at 5 minutes.  excellent  recall.  can easily read and write a sentence.  alert and oriented x 3   Assessment:   Medicare wellness utd on preventive parameters.     Plan:   During the course of the visit the patient was educated and counseled about appropriate screening and preventive services including:        Fall prevention is advised today Diabetes screening  Nutrition counseling is offered He declines ref audiol  advanced directives/end of life addressed today:  see healthcare directives hyperlink  Vaccines are updated as needed  Patient Instructions (the written plan) was given to the patient.

## 2018-02-01 NOTE — Patient Instructions (Addendum)
Please stop taking the metoprolol. blood tests are requested for you today.  We'll let you know about the results. Please consider these measures for your health:  minimize alcohol.  Do not use tobacco products.  Have a colonoscopy at least every 10 years from age 84. Keep firearms safely stored.  Always use seat belts.  have working smoke alarms in your home.  See an eye doctor and dentist regularly.  Never drive under the influence of alcohol or drugs (including prescription drugs).  Those with fair skin should take precautions against the sun, and should carefully examine their skin once per month, for any new or changed moles.  please let me know what your wishes would be, if artificial life support measures should become necessary.  It is critically important to prevent falling down (keep floor areas well-lit, dry, and free of loose objects.  If you have a cane, walker, or wheelchair, you should use it, even for short trips around the house.  Wear flat-soled shoes.  Also, try not to rush).  Please come back for a follow-up appointment in 6 months.

## 2018-02-04 ENCOUNTER — Telehealth: Payer: Self-pay | Admitting: Endocrinology

## 2018-02-04 NOTE — Telephone Encounter (Signed)
Patient is calling back for labs results.

## 2018-02-04 NOTE — Telephone Encounter (Signed)
I have called & reviewed labs with patient.

## 2018-04-19 ENCOUNTER — Other Ambulatory Visit: Payer: Self-pay | Admitting: Endocrinology

## 2018-04-19 DIAGNOSIS — N529 Male erectile dysfunction, unspecified: Secondary | ICD-10-CM

## 2018-04-19 DIAGNOSIS — N289 Disorder of kidney and ureter, unspecified: Secondary | ICD-10-CM

## 2018-04-19 DIAGNOSIS — I10 Essential (primary) hypertension: Secondary | ICD-10-CM

## 2018-04-19 DIAGNOSIS — R972 Elevated prostate specific antigen [PSA]: Secondary | ICD-10-CM

## 2018-04-19 DIAGNOSIS — E559 Vitamin D deficiency, unspecified: Secondary | ICD-10-CM

## 2018-04-19 DIAGNOSIS — E785 Hyperlipidemia, unspecified: Secondary | ICD-10-CM

## 2018-04-19 DIAGNOSIS — Z125 Encounter for screening for malignant neoplasm of prostate: Secondary | ICD-10-CM

## 2018-04-19 DIAGNOSIS — Z0000 Encounter for general adult medical examination without abnormal findings: Secondary | ICD-10-CM

## 2018-04-19 DIAGNOSIS — E876 Hypokalemia: Secondary | ICD-10-CM

## 2018-04-19 DIAGNOSIS — R739 Hyperglycemia, unspecified: Secondary | ICD-10-CM

## 2018-04-20 ENCOUNTER — Other Ambulatory Visit: Payer: Self-pay | Admitting: Endocrinology

## 2018-04-23 ENCOUNTER — Other Ambulatory Visit: Payer: Self-pay

## 2018-04-23 MED ORDER — CLOBETASOL PROPIONATE 0.05 % EX OINT: TOPICAL_OINTMENT | Freq: Two times a day (BID) | CUTANEOUS | 0 refills | Status: DC

## 2018-05-06 DIAGNOSIS — Z0000 Encounter for general adult medical examination without abnormal findings: Secondary | ICD-10-CM | POA: Diagnosis not present

## 2018-05-06 DIAGNOSIS — E041 Nontoxic single thyroid nodule: Secondary | ICD-10-CM | POA: Diagnosis not present

## 2018-05-06 DIAGNOSIS — L409 Psoriasis, unspecified: Secondary | ICD-10-CM | POA: Diagnosis not present

## 2018-05-06 DIAGNOSIS — I471 Supraventricular tachycardia: Secondary | ICD-10-CM | POA: Diagnosis not present

## 2018-05-06 DIAGNOSIS — E039 Hypothyroidism, unspecified: Secondary | ICD-10-CM | POA: Diagnosis not present

## 2018-05-06 DIAGNOSIS — N2581 Secondary hyperparathyroidism of renal origin: Secondary | ICD-10-CM | POA: Diagnosis not present

## 2018-05-06 DIAGNOSIS — E782 Mixed hyperlipidemia: Secondary | ICD-10-CM | POA: Diagnosis not present

## 2018-05-06 DIAGNOSIS — I129 Hypertensive chronic kidney disease with stage 1 through stage 4 chronic kidney disease, or unspecified chronic kidney disease: Secondary | ICD-10-CM | POA: Diagnosis not present

## 2018-05-06 DIAGNOSIS — M1990 Unspecified osteoarthritis, unspecified site: Secondary | ICD-10-CM | POA: Diagnosis not present

## 2018-05-06 DIAGNOSIS — D631 Anemia in chronic kidney disease: Secondary | ICD-10-CM | POA: Diagnosis not present

## 2018-05-06 DIAGNOSIS — N183 Chronic kidney disease, stage 3 (moderate): Secondary | ICD-10-CM | POA: Diagnosis not present

## 2018-05-06 DIAGNOSIS — N189 Chronic kidney disease, unspecified: Secondary | ICD-10-CM | POA: Diagnosis not present

## 2018-05-10 ENCOUNTER — Telehealth: Payer: Self-pay

## 2018-05-10 NOTE — Telephone Encounter (Signed)
Records received from [address], placed on Dr. Rosario Adie desk

## 2018-05-31 DIAGNOSIS — L400 Psoriasis vulgaris: Secondary | ICD-10-CM | POA: Diagnosis not present

## 2018-05-31 DIAGNOSIS — C44622 Squamous cell carcinoma of skin of right upper limb, including shoulder: Secondary | ICD-10-CM | POA: Diagnosis not present

## 2018-05-31 DIAGNOSIS — L570 Actinic keratosis: Secondary | ICD-10-CM | POA: Diagnosis not present

## 2018-05-31 DIAGNOSIS — Z85828 Personal history of other malignant neoplasm of skin: Secondary | ICD-10-CM | POA: Diagnosis not present

## 2018-07-26 ENCOUNTER — Encounter: Payer: Self-pay | Admitting: Endocrinology

## 2018-07-26 ENCOUNTER — Ambulatory Visit (INDEPENDENT_AMBULATORY_CARE_PROVIDER_SITE_OTHER): Payer: Medicare Other | Admitting: Endocrinology

## 2018-07-26 VITALS — BP 132/82 | HR 76 | Temp 98.2°F | Ht 71.0 in | Wt 181.2 lb

## 2018-07-26 DIAGNOSIS — E559 Vitamin D deficiency, unspecified: Secondary | ICD-10-CM

## 2018-07-26 DIAGNOSIS — R221 Localized swelling, mass and lump, neck: Secondary | ICD-10-CM | POA: Insufficient documentation

## 2018-07-26 DIAGNOSIS — N289 Disorder of kidney and ureter, unspecified: Secondary | ICD-10-CM | POA: Diagnosis not present

## 2018-07-26 LAB — BASIC METABOLIC PANEL
BUN: 24 mg/dL — ABNORMAL HIGH (ref 6–23)
CO2: 28 meq/L (ref 19–32)
Calcium: 9.7 mg/dL (ref 8.4–10.5)
Chloride: 103 mEq/L (ref 96–112)
Creatinine, Ser: 1.72 mg/dL — ABNORMAL HIGH (ref 0.40–1.50)
GFR: 40.11 mL/min — ABNORMAL LOW (ref >60.00)
GLUCOSE: 101 mg/dL — AB (ref 70–99)
POTASSIUM: 4.2 meq/L (ref 3.5–5.1)
SODIUM: 138 meq/L (ref 135–145)

## 2018-07-26 LAB — VITAMIN D 25 HYDROXY (VIT D DEFICIENCY, FRACTURES): VITD: 17.56 ng/mL — ABNORMAL LOW (ref 30.00–100.00)

## 2018-07-26 NOTE — Patient Instructions (Addendum)
Let's check the ultrasound.  you will receive a phone call, about a day and time for an appointment.  blood tests are requested for you today.  We'll let you know about the results.  Beast wishes with your new primary care provider.

## 2018-07-26 NOTE — Progress Notes (Signed)
Subjective:    Patient ID: Brendan Long, male    DOB: 09-05-1928, 84 y.o.   MRN: [id number]  HPI  Pt states 1 month of slight fullness at the left ant neck neck, and assoc "cramp."  No injury Dyslipidemia: denies chest pain Renal insuff: denies dysuria elev PSA: denies decreased urinary stream.   Past Medical History:  Diagnosis Date  . ED (erectile dysfunction)   . Hyperlipidemia   . Hypertension   . Hypothyroidism   . Psoriasis   . Varicose veins   . Vitamin D deficiency     Past Surgical History:  Procedure Laterality Date  . JOINT REPLACEMENT    . joint replacement rt and left    . rotator cuff repair, right shoulder per Dr. Tonita Cong  1996  . torn right bicepts muscle  1996  . TOTAL HIP ARTHROPLASTY  01/17/10   redone on right hip on 09/05/10    Social History   Socioeconomic History  . Marital status: Widowed    Spouse name: Not on file  . Number of children: Not on file  . Years of education: Not on file  . Highest education level: Not on file  Occupational History  . Not on file  Social Needs  . Financial resource strain: Not on file  . Food insecurity:    Worry: Not on file    Inability: Not on file  . Transportation needs:    Medical: Not on file    Non-medical: Not on file  Tobacco Use  . Smoking status: Former Research scientist (life sciences)  . Smokeless tobacco: Never Used  Substance and Sexual Activity  . Alcohol use: No  . Drug use: No  . Sexual activity: Yes  Lifestyle  . Physical activity:    Days per week: Not on file    Minutes per session: Not on file  . Stress: Not on file  Relationships  . Social connections:    Talks on phone: Not on file    Gets together: Not on file    Attends religious service: Not on file    Active member of club or organization: Not on file    Attends meetings of clubs or organizations: Not on file    Relationship status: Not on file  . Intimate partner violence:    Fear of current or ex partner: Not on file    Emotionally  abused: Not on file    Physically abused: Not on file    Forced sexual activity: Not on file  Other Topics Concern  . Not on file  Social History Narrative  . Not on file    Current Outpatient Medications on File Prior to Visit  Medication Sig Dispense Refill  . aspirin 325 MG tablet Take 0.5 tablet once a day    . atorvastatin (LIPITOR) 80 MG tablet TAKE 1 TABLET DAILY 90 tablet 2  . budesonide-formoterol (SYMBICORT) 80-4.5 MCG/ACT inhaler Inhale 2 puffs into the lungs 2 (two) times daily. 1 Inhaler 3  . clobetasol ointment (TEMOVATE) 0.05 % Apply topically 2 (two) times daily. 30 g 0  . diclofenac sodium (VOLTAREN) 1 % GEL APPLY 4 GRAMS TOPICALLY FOUR TIMES A DAY AS NEEDED FOR KNEE PAIN 200 g 28  . hydrochlorothiazide (HYDRODIURIL) 12.5 MG tablet TAKE 1 TABLET DAILY 90 tablet 2  . niacin 500 MG tablet Take 500 mg by mouth daily.    . sildenafil (VIAGRA) 100 MG tablet TAKE 1 TABLET AS NEEDED ONE HOUR BEFORE NEEDED FOR ERECTILE  DYSFUNCTION 10 tablet 7  . TRICOR 48 MG tablet TAKE 1 TABLET DAILY 90 tablet 4  . omega-3 acid ethyl esters (LOVAZA) 1 G capsule Take 2 capsules (2 g total) by mouth 2 (two) times daily. 120 capsule 11   No current facility-administered medications on file prior to visit.     No Known Allergies  Family History  Problem Relation Age of Onset  . Breast cancer Unknown     BP 132/82   Pulse 76   Temp 98.2 F (36.8 C) (Oral)   Ht 5\' 11"  (1.803 m)   Wt 181 lb 3.2 oz (82.2 kg)   BMI 25.27 kg/m    Review of Systems Denies leg swelling and sob    Objective:   Physical Exam VITAL SIGNS:  See vs page GENERAL: no distress NECK: There is no palpable thyroid enlargement.  No thyroid nodule is palpable.  No palpable lymphadenopathy, or mass at the anterior neck. Ext: no leg edema.    Lab Results  Component Value Date   PSA 5.28 (H) 02/01/2018   PSA 5.62 (H) 12/15/2016   PSA 4.68 (H) 12/16/2014      Assessment & Plan:  Neck sxs, new, uncertain  etiology elev PSA: no f/u needed at pt's advanced age Renal insuff: recheck today Dyslipidemia: recheck today  Patient Instructions  Let's check the ultrasound.  you will receive a phone call, about a day and time for an appointment.  blood tests are requested for you today.  We'll let you know about the results.  Beast wishes with your new primary care provider.

## 2018-08-02 ENCOUNTER — Other Ambulatory Visit: Payer: Medicare Other

## 2018-08-06 ENCOUNTER — Other Ambulatory Visit: Payer: Medicare Other

## 2018-08-12 ENCOUNTER — Ambulatory Visit
Admission: RE | Admit: 2018-08-12 | Discharge: 2018-08-12 | Disposition: A | Discharge status: Home / Self Care | Payer: Medicare Other | Source: Ambulatory Visit | Attending: Endocrinology | Admitting: Endocrinology

## 2018-08-12 DIAGNOSIS — R221 Localized swelling, mass and lump, neck: Secondary | ICD-10-CM | POA: Diagnosis not present

## 2018-08-12 IMAGING — US US SOFT TISSUE HEAD/NECK
1 series · 6 of 6 positions shown · non-contrast
Comparison: None.

CLINICAL DATA: 89-year-old male with generalized swelling of the
left neck.

EXAM:
ULTRASOUND OF HEAD/NECK SOFT TISSUES
TECHNIQUE: Ultrasound examination of the head and neck soft tissues was
performed in the area of clinical concern.

[Series 1: us soft tissue head/neck · 0.08mm/px · 6 of 6 slices shown]
[im 1/6]
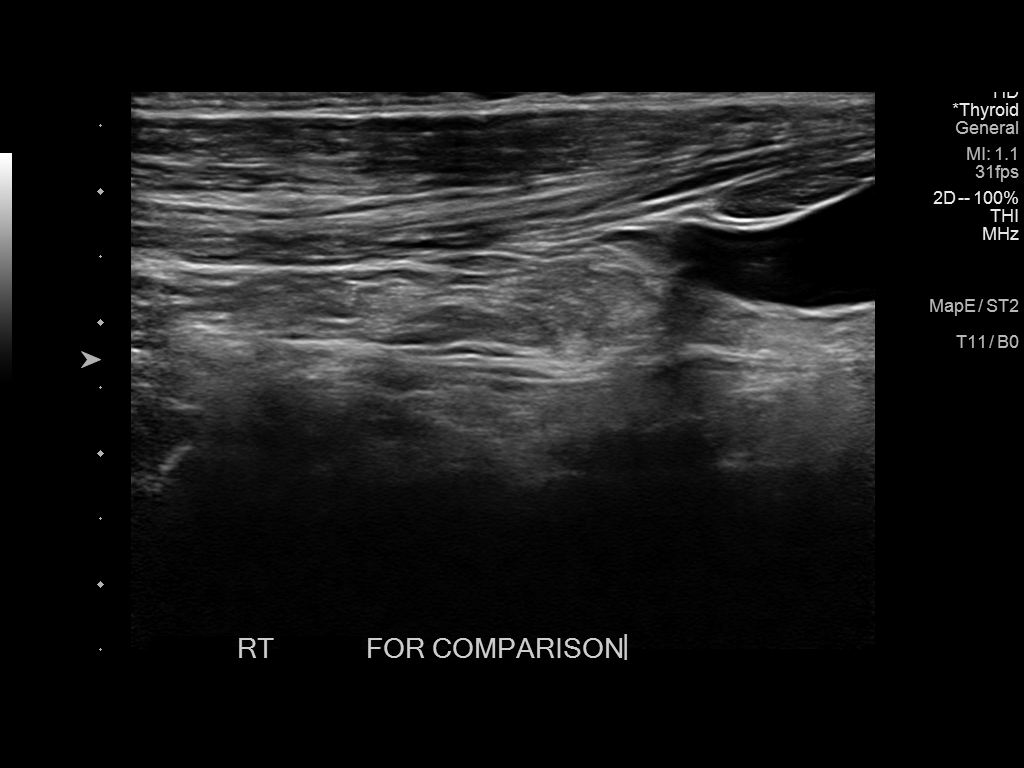
[im 2/6]
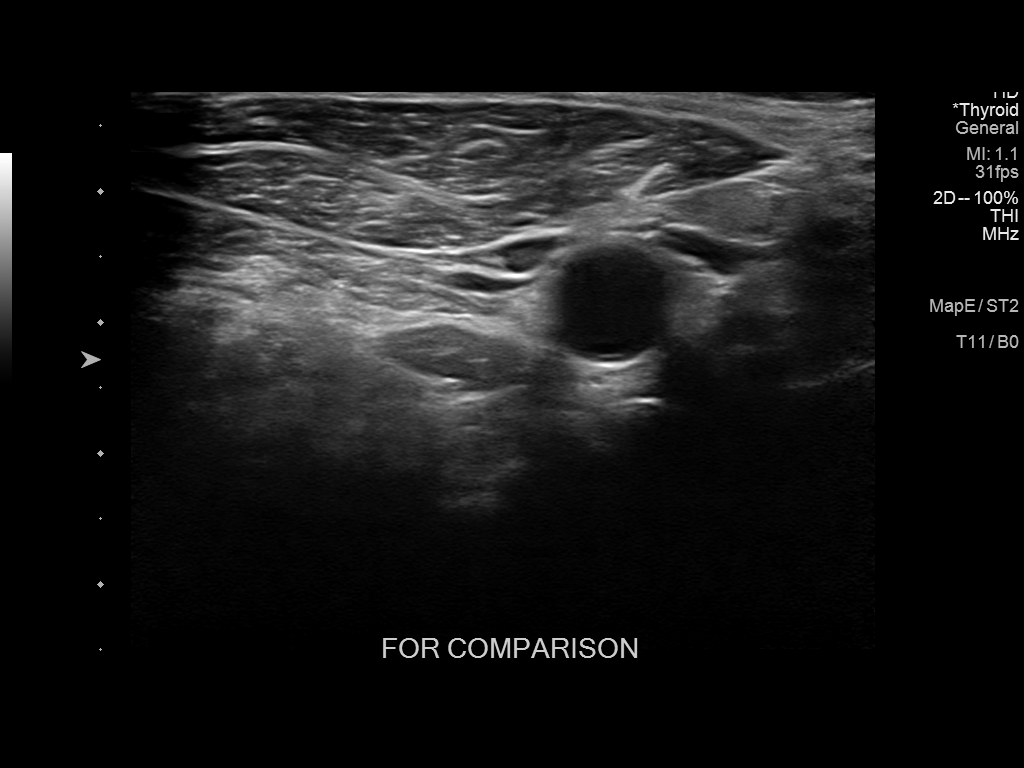
[im 3/6]
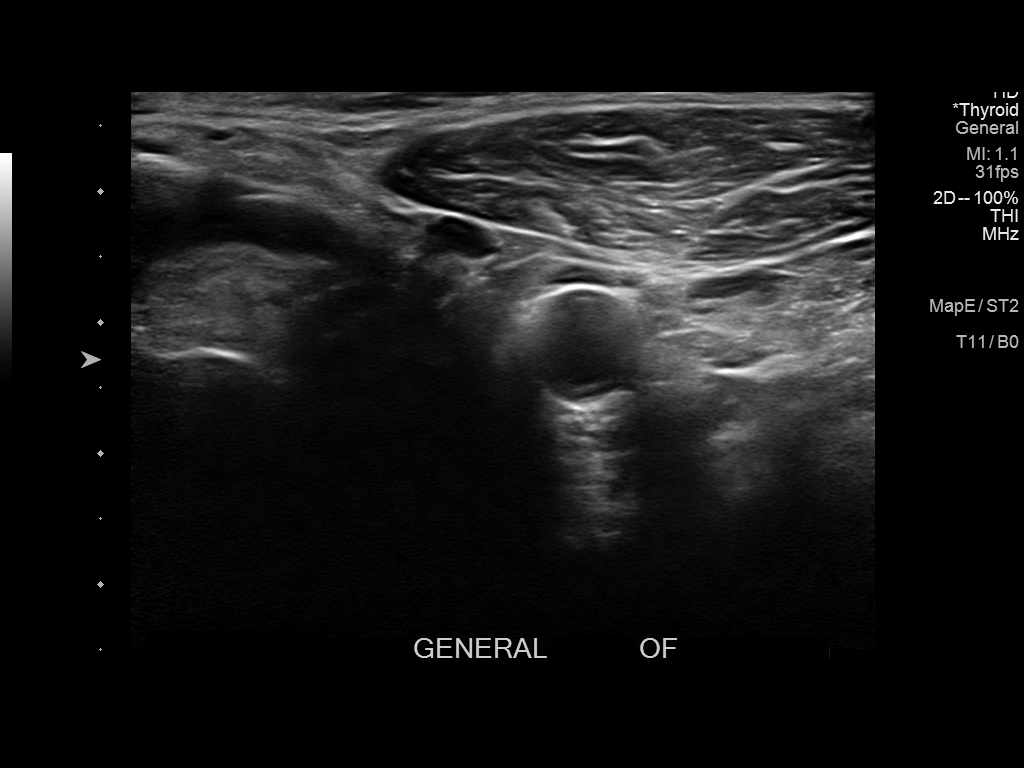
[im 4/6]
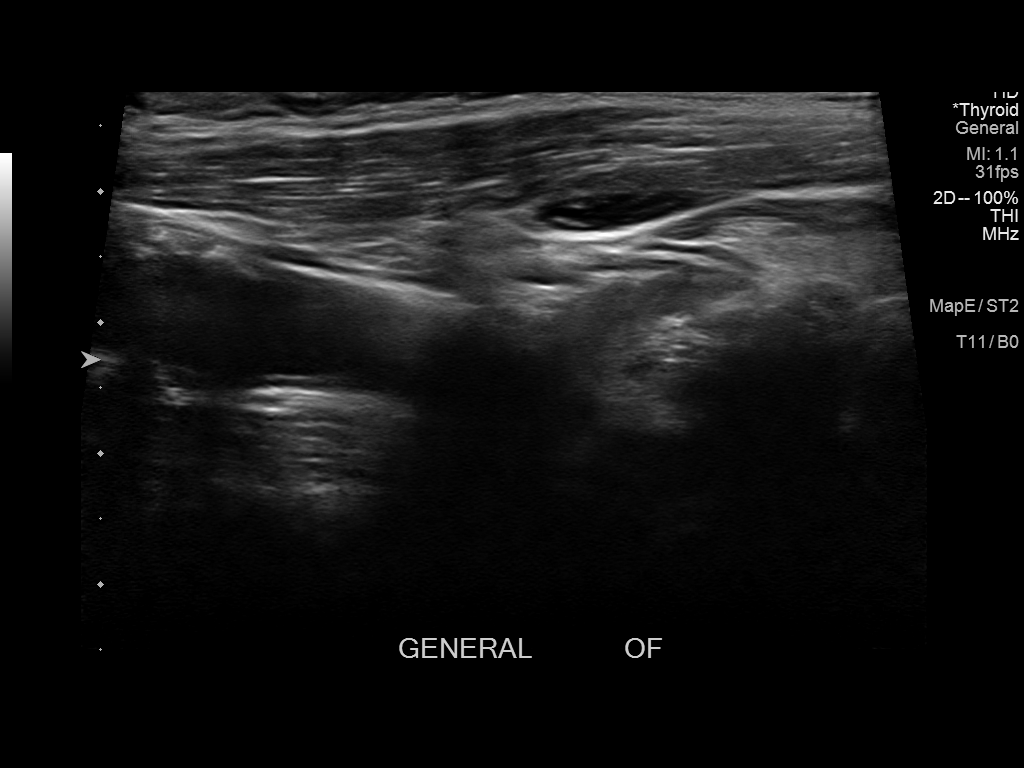
[im 5/6]
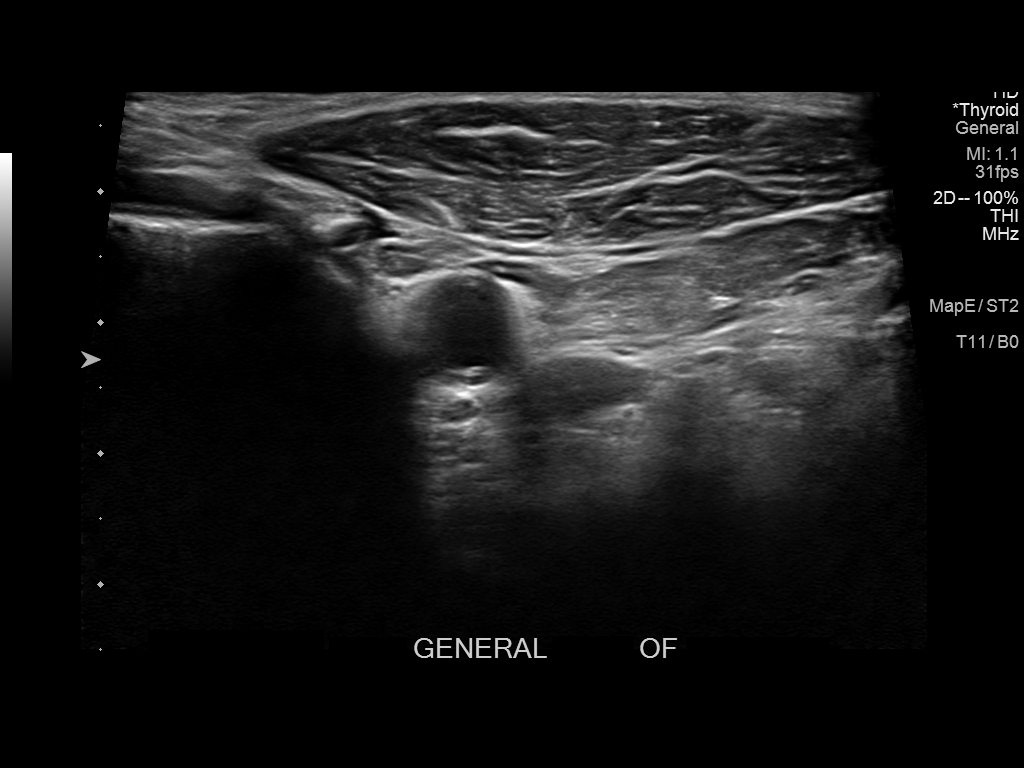
[im 6/6]
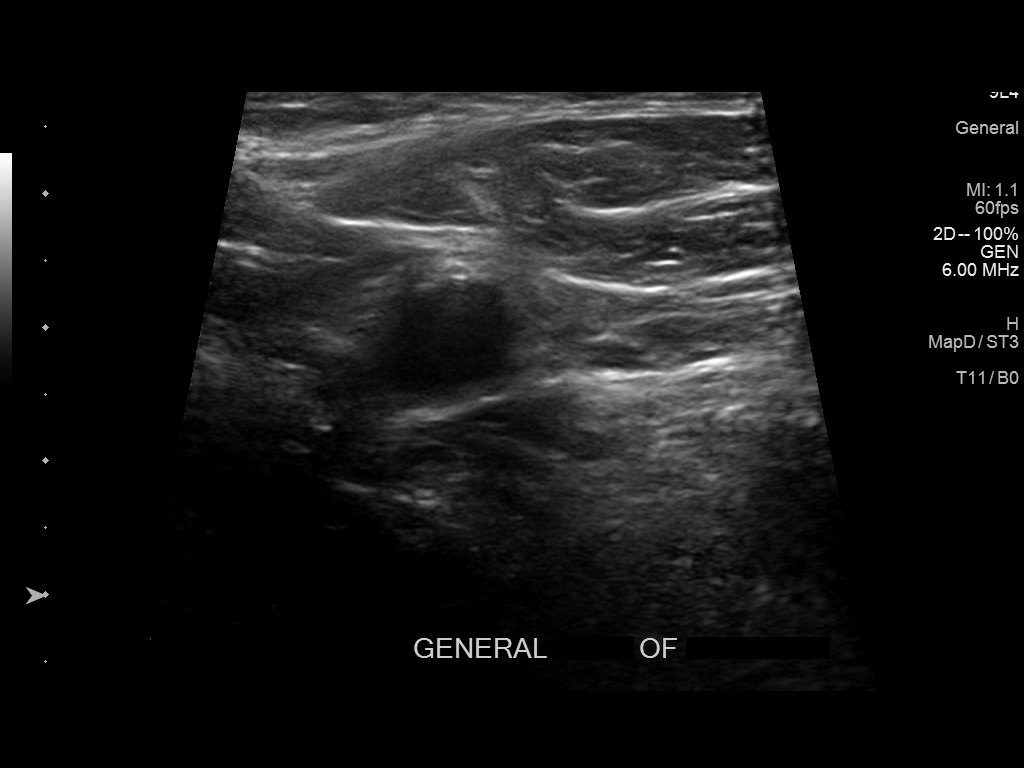

[6 of 6 positions shown; findings below may reference images not displayed]

FINDINGS: Sonographic interrogation of the left, and for comparison, the right
neck was performed. Sonographic evaluation demonstrates normal
subcutaneous adipose tissue, muscularity and vessels. No evidence of
fatty, solid or cystic mass lesion. No adenopathy identified.
IMPRESSION: Negative sonographic evaluation of the region of clinical concern.

## 2018-08-29 ENCOUNTER — Telehealth: Payer: Self-pay | Admitting: Endocrinology

## 2018-08-29 NOTE — Telephone Encounter (Signed)
error 

## 2018-08-30 ENCOUNTER — Encounter: Payer: Self-pay | Admitting: Internal Medicine

## 2018-08-30 ENCOUNTER — Ambulatory Visit (INDEPENDENT_AMBULATORY_CARE_PROVIDER_SITE_OTHER): Payer: Medicare Other | Admitting: Internal Medicine

## 2018-08-30 VITALS — BP 130/80 | HR 82 | Temp 98.2°F | Ht 71.0 in | Wt 180.1 lb

## 2018-08-30 DIAGNOSIS — E785 Hyperlipidemia, unspecified: Secondary | ICD-10-CM

## 2018-08-30 DIAGNOSIS — E039 Hypothyroidism, unspecified: Secondary | ICD-10-CM | POA: Diagnosis not present

## 2018-08-30 DIAGNOSIS — I10 Essential (primary) hypertension: Secondary | ICD-10-CM

## 2018-08-30 DIAGNOSIS — E559 Vitamin D deficiency, unspecified: Secondary | ICD-10-CM | POA: Diagnosis not present

## 2018-08-30 NOTE — Progress Notes (Signed)
Established Patient Office Visit     CC/Reason for Visit: Establish care, follow-up on chronic medical conditions  HPI: Brendan Long is a 85 y.o. male who is coming in today for the above mentioned reasons.  He is transferring care from Dr. Renato Shin.  Past Medical History is significant for: Left eye cataract for which he is currently undergoing work-up by ophthalmology for surgery, he has a history of hyperlipidemia on a statin and a fibrate, last LDL was 110 in June 2019, has a history of hypertension that has been well controlled on hydrochlorothiazide, history of vitamin D deficiency for which she takes over-the-counter supplements.  He has no complaints today.   Past Medical/Surgical History: Past Medical History:  Diagnosis Date  . ED (erectile dysfunction)   . Hyperlipidemia   . Hypertension   . Hypothyroidism   . Psoriasis   . Varicose veins   . Vitamin D deficiency     Past Surgical History:  Procedure Laterality Date  . JOINT REPLACEMENT    . joint replacement rt and left    . rotator cuff repair, right shoulder per Dr. Tonita Cong  1996  . torn right bicepts muscle  1996  . TOTAL HIP ARTHROPLASTY  01/17/10   redone on right hip on 09/05/10    Social History:  reports that he has quit smoking. He has never used smokeless tobacco. He reports that he does not drink alcohol or use drugs.  Allergies: No Known Allergies  Family History:  Family History  Problem Relation Age of Onset  . Breast cancer Unknown      Current Outpatient Medications:  .  aspirin 325 MG tablet, Take 0.5 tablet once a day, Disp: , Rfl:  .  atorvastatin (LIPITOR) 80 MG tablet, TAKE 1 TABLET DAILY, Disp: 90 tablet, Rfl: 2 .  budesonide-formoterol (SYMBICORT) 80-4.5 MCG/ACT inhaler, Inhale 2 puffs into the lungs 2 (two) times daily., Disp: 1 Inhaler, Rfl: 3 .  clobetasol ointment (TEMOVATE) 0.05 %, Apply topically 2 (two) times daily., Disp: 30 g, Rfl: 0 .  diclofenac sodium  (VOLTAREN) 1 % GEL, APPLY 4 GRAMS TOPICALLY FOUR TIMES A DAY AS NEEDED FOR KNEE PAIN, Disp: 200 g, Rfl: 28 .  hydrochlorothiazide (HYDRODIURIL) 12.5 MG tablet, TAKE 1 TABLET DAILY, Disp: 90 tablet, Rfl: 2 .  niacin 500 MG tablet, Take 500 mg by mouth daily., Disp: , Rfl:  .  sildenafil (VIAGRA) 100 MG tablet, TAKE 1 TABLET AS NEEDED ONE HOUR BEFORE NEEDED FOR ERECTILE DYSFUNCTION, Disp: 10 tablet, Rfl: 7 .  TRICOR 48 MG tablet, TAKE 1 TABLET DAILY, Disp: 90 tablet, Rfl: 4 .  omega-3 acid ethyl esters (LOVAZA) 1 G capsule, Take 2 capsules (2 g total) by mouth 2 (two) times daily., Disp: 120 capsule, Rfl: 11  Review of Systems:  Constitutional: Denies fever, chills, diaphoresis, appetite change and fatigue.  HEENT: Denies photophobia, eye pain, redness, hearing loss, ear pain, congestion, sore throat, rhinorrhea, sneezing, mouth sores, trouble swallowing, neck pain, neck stiffness and tinnitus.   Respiratory: Denies SOB, DOE, cough, chest tightness,  and wheezing.   Cardiovascular: Denies chest pain, palpitations and leg swelling.  Gastrointestinal: Denies nausea, vomiting, abdominal pain, diarrhea, constipation, blood in stool and abdominal distention.  Genitourinary: Denies dysuria, urgency, frequency, hematuria, flank pain and difficulty urinating.  Endocrine: Denies: hot or cold intolerance, sweats, changes in hair or nails, polyuria, polydipsia. Musculoskeletal: Denies myalgias, back pain, joint swelling, arthralgias and gait problem.  Skin: Denies pallor, rash  and wound.  Neurological: Denies dizziness, seizures, syncope, weakness, light-headedness, numbness and headaches.  Hematological: Denies adenopathy. Easy bruising, personal or family bleeding history  Psychiatric/Behavioral: Denies suicidal ideation, mood changes, confusion, nervousness, sleep disturbance and agitation    Physical Exam: Vitals:   08/30/18 1517  BP: 130/80  Pulse: 82  Temp: 98.2 F (36.8 C)  TempSrc: Oral    SpO2: 95%  Weight: 180 lb 1.6 oz (81.7 kg)  Height: 5\' 11"  (1.803 m)    Body mass index is 25.12 kg/m.   Constitutional: NAD, calm, comfortable Eyes: PERRL, lids and conjunctivae normal ENMT: Mucous membranes are moist.  Neck: normal, supple, no masses, no thyromegaly Respiratory: clear to auscultation bilaterally, no wheezing, no crackles. Normal respiratory effort. No accessory muscle use.  Cardiovascular: Regular rate and rhythm, no murmurs / rubs / gallops. No extremity edema. 2+ pedal pulses. No carotid bruits.  Abdomen: no tenderness, no masses palpated. No hepatosplenomegaly. Bowel sounds positive.  Musculoskeletal: no clubbing / cyanosis. No joint deformity upper and lower extremities. Good ROM, no contractures. Normal muscle tone.  Neurologic: CN 2-12 grossly intact. Sensation intact, DTR normal. Strength 5/5 in all 4.  Psychiatric: Normal judgment and insight. Alert and oriented x 3. Normal mood.    Impression and Plan:  Essential hypertension -Well-controlled, continue current management  Dyslipidemia -Last LDL was 110 in June 2019, plan to continue atorvastatin, TriCor, niacin for now.  Vitamin D deficiency -Continue over-the-counter supplementation, will recheck levels at next annual visit.  Hypothyroidism, unspecified type -He has a history of hypothyroidism but does not appear to be taking supplementation, his last TSH was 2.11 in June 2019, will recheck at next visit.    Patient Instructions  Great meeting you this afternoon.  Instructions: -follow up in July for an annual physical exam, come fasting, so we can draw blood work -bring in a complete list of your daily medications to your next appointment -let us know how your eye surgery goes!     Lelon Frohlich, MD [facility]

## 2018-08-30 NOTE — Patient Instructions (Addendum)
Great meeting you this afternoon.  Instructions: -follow up in July for an annual physical exam, come fasting, so we can draw blood work -bring in a complete list of your daily medications to your next appointment -let us know how your eye surgery goes!

## 2018-10-19 ENCOUNTER — Other Ambulatory Visit: Payer: Self-pay | Admitting: Endocrinology

## 2018-10-21 DIAGNOSIS — E782 Mixed hyperlipidemia: Secondary | ICD-10-CM | POA: Diagnosis not present

## 2018-10-21 DIAGNOSIS — N2581 Secondary hyperparathyroidism of renal origin: Secondary | ICD-10-CM | POA: Diagnosis not present

## 2018-10-21 DIAGNOSIS — N183 Chronic kidney disease, stage 3 (moderate): Secondary | ICD-10-CM | POA: Diagnosis not present

## 2018-10-21 DIAGNOSIS — N189 Chronic kidney disease, unspecified: Secondary | ICD-10-CM | POA: Diagnosis not present

## 2018-10-21 DIAGNOSIS — L409 Psoriasis, unspecified: Secondary | ICD-10-CM | POA: Diagnosis not present

## 2018-10-21 DIAGNOSIS — M1990 Unspecified osteoarthritis, unspecified site: Secondary | ICD-10-CM | POA: Diagnosis not present

## 2018-10-21 DIAGNOSIS — E039 Hypothyroidism, unspecified: Secondary | ICD-10-CM | POA: Diagnosis not present

## 2018-10-21 DIAGNOSIS — I129 Hypertensive chronic kidney disease with stage 1 through stage 4 chronic kidney disease, or unspecified chronic kidney disease: Secondary | ICD-10-CM | POA: Diagnosis not present

## 2018-10-21 DIAGNOSIS — D631 Anemia in chronic kidney disease: Secondary | ICD-10-CM | POA: Diagnosis not present

## 2019-03-18 ENCOUNTER — Observation Stay (HOSPITAL_COMMUNITY): Payer: Medicare Other

## 2019-03-18 ENCOUNTER — Encounter (HOSPITAL_COMMUNITY): Payer: Self-pay

## 2019-03-18 ENCOUNTER — Emergency Department (HOSPITAL_COMMUNITY): Payer: Medicare Other

## 2019-03-18 ENCOUNTER — Other Ambulatory Visit: Payer: Self-pay

## 2019-03-18 ENCOUNTER — Inpatient Hospital Stay (HOSPITAL_COMMUNITY)
Admission: EM | Admit: 2019-03-18 | Discharge: 2019-03-21 | DRG: 155 | Disposition: A | Discharge status: Home / Self Care | Payer: Medicare Other | Attending: Internal Medicine | Admitting: Internal Medicine

## 2019-03-18 DIAGNOSIS — Z803 Family history of malignant neoplasm of breast: Secondary | ICD-10-CM

## 2019-03-18 DIAGNOSIS — Z96641 Presence of right artificial hip joint: Secondary | ICD-10-CM | POA: Diagnosis present

## 2019-03-18 DIAGNOSIS — I480 Paroxysmal atrial fibrillation: Secondary | ICD-10-CM | POA: Diagnosis present

## 2019-03-18 DIAGNOSIS — N289 Disorder of kidney and ureter, unspecified: Secondary | ICD-10-CM | POA: Diagnosis present

## 2019-03-18 DIAGNOSIS — R112 Nausea with vomiting, unspecified: Secondary | ICD-10-CM | POA: Diagnosis not present

## 2019-03-18 DIAGNOSIS — Z7982 Long term (current) use of aspirin: Secondary | ICD-10-CM | POA: Diagnosis not present

## 2019-03-18 DIAGNOSIS — Z87891 Personal history of nicotine dependence: Secondary | ICD-10-CM | POA: Diagnosis not present

## 2019-03-18 DIAGNOSIS — I129 Hypertensive chronic kidney disease with stage 1 through stage 4 chronic kidney disease, or unspecified chronic kidney disease: Secondary | ICD-10-CM | POA: Diagnosis present

## 2019-03-18 DIAGNOSIS — R42 Dizziness and giddiness: Secondary | ICD-10-CM

## 2019-03-18 DIAGNOSIS — J449 Chronic obstructive pulmonary disease, unspecified: Secondary | ICD-10-CM | POA: Diagnosis present

## 2019-03-18 DIAGNOSIS — Z20828 Contact with and (suspected) exposure to other viral communicable diseases: Secondary | ICD-10-CM | POA: Diagnosis not present

## 2019-03-18 DIAGNOSIS — E039 Hypothyroidism, unspecified: Secondary | ICD-10-CM | POA: Diagnosis present

## 2019-03-18 DIAGNOSIS — Z79899 Other long term (current) drug therapy: Secondary | ICD-10-CM | POA: Diagnosis not present

## 2019-03-18 DIAGNOSIS — R972 Elevated prostate specific antigen [PSA]: Secondary | ICD-10-CM | POA: Diagnosis present

## 2019-03-18 DIAGNOSIS — I10 Essential (primary) hypertension: Secondary | ICD-10-CM | POA: Diagnosis not present

## 2019-03-18 DIAGNOSIS — Z8673 Personal history of transient ischemic attack (TIA), and cerebral infarction without residual deficits: Secondary | ICD-10-CM

## 2019-03-18 DIAGNOSIS — I639 Cerebral infarction, unspecified: Secondary | ICD-10-CM | POA: Diagnosis not present

## 2019-03-18 DIAGNOSIS — Z8679 Personal history of other diseases of the circulatory system: Secondary | ICD-10-CM

## 2019-03-18 DIAGNOSIS — R7303 Prediabetes: Secondary | ICD-10-CM | POA: Diagnosis not present

## 2019-03-18 DIAGNOSIS — Z7951 Long term (current) use of inhaled steroids: Secondary | ICD-10-CM

## 2019-03-18 DIAGNOSIS — N179 Acute kidney failure, unspecified: Secondary | ICD-10-CM

## 2019-03-18 DIAGNOSIS — E785 Hyperlipidemia, unspecified: Secondary | ICD-10-CM | POA: Diagnosis not present

## 2019-03-18 DIAGNOSIS — E1122 Type 2 diabetes mellitus with diabetic chronic kidney disease: Secondary | ICD-10-CM | POA: Diagnosis not present

## 2019-03-18 DIAGNOSIS — R2689 Other abnormalities of gait and mobility: Secondary | ICD-10-CM | POA: Diagnosis not present

## 2019-03-18 DIAGNOSIS — H933X9 Disorders of unspecified acoustic nerve: Secondary | ICD-10-CM | POA: Diagnosis not present

## 2019-03-18 DIAGNOSIS — H538 Other visual disturbances: Secondary | ICD-10-CM | POA: Diagnosis not present

## 2019-03-18 DIAGNOSIS — N183 Chronic kidney disease, stage 3 (moderate): Secondary | ICD-10-CM | POA: Diagnosis present

## 2019-03-18 LAB — COMPREHENSIVE METABOLIC PANEL
ALT: 16 U/L (ref 0–44)
AST: 19 U/L (ref 15–41)
Albumin: 4 g/dL (ref 3.5–5.0)
Alkaline Phosphatase: 82 U/L (ref 38–126)
Anion gap: 12 (ref 5–15)
BUN: 32 mg/dL — ABNORMAL HIGH (ref 8–23)
CO2: 22 mmol/L (ref 22–32)
Calcium: 9.3 mg/dL (ref 8.9–10.3)
Chloride: 105 mmol/L (ref 98–111)
Creatinine, Ser: 1.83 mg/dL — ABNORMAL HIGH (ref 0.61–1.24)
GFR calc Af Amer: 37 mL/min — ABNORMAL LOW (ref >60)
GFR calc non Af Amer: 32 mL/min — ABNORMAL LOW (ref >60)
Glucose, Bld: 126 mg/dL — ABNORMAL HIGH (ref 70–99)
Potassium: 3.8 mmol/L (ref 3.5–5.1)
Sodium: 139 mmol/L (ref 135–145)
Total Bilirubin: 1.5 mg/dL — ABNORMAL HIGH (ref 0.3–1.2)
Total Protein: 7.6 g/dL (ref 6.5–8.1)

## 2019-03-18 LAB — DIFFERENTIAL
Abs Immature Granulocytes: 0.04 10*3/uL (ref 0.00–0.07)
Basophils Absolute: 0 10*3/uL (ref 0.0–0.1)
Basophils Relative: 0 %
Eosinophils Absolute: 0.4 10*3/uL (ref 0.0–0.5)
Eosinophils Relative: 4 %
Immature Granulocytes: 0 %
Lymphocytes Relative: 21 %
Lymphs Abs: 2.2 10*3/uL (ref 0.7–4.0)
Monocytes Absolute: 0.6 10*3/uL (ref 0.1–1.0)
Monocytes Relative: 5 %
Neutro Abs: 7.4 10*3/uL (ref 1.7–7.7)
Neutrophils Relative %: 70 %

## 2019-03-18 LAB — APTT: aPTT: 29 seconds (ref 24–36)

## 2019-03-18 LAB — URINALYSIS, ROUTINE W REFLEX MICROSCOPIC
Bacteria, UA: NONE SEEN
Bilirubin Urine: NEGATIVE
Glucose, UA: NEGATIVE mg/dL
Ketones, ur: NEGATIVE mg/dL
Leukocytes,Ua: NEGATIVE
Nitrite: NEGATIVE
Protein, ur: 100 mg/dL — AB
Specific Gravity, Urine: 1.014 (ref 1.005–1.030)
pH: 7 (ref 5.0–8.0)

## 2019-03-18 LAB — CBC
HCT: 48.4 % (ref 39.0–52.0)
Hemoglobin: 15.5 g/dL (ref 13.0–17.0)
MCH: 30.5 pg (ref 26.0–34.0)
MCHC: 32 g/dL (ref 30.0–36.0)
MCV: 95.1 fL (ref 80.0–100.0)
Platelets: 185 10*3/uL (ref 150–400)
RBC: 5.09 MIL/uL (ref 4.22–5.81)
RDW: 13.3 % (ref 11.5–15.5)
WBC: 10.7 10*3/uL — ABNORMAL HIGH (ref 4.0–10.5)
nRBC: 0 % (ref 0.0–0.2)

## 2019-03-18 LAB — PROTIME-INR
INR: 0.9 (ref 0.8–1.2)
Prothrombin Time: 12.1 seconds (ref 11.4–15.2)

## 2019-03-18 LAB — RAPID URINE DRUG SCREEN, HOSP PERFORMED
Amphetamines: NOT DETECTED
Barbiturates: NOT DETECTED
Benzodiazepines: NOT DETECTED
Cocaine: NOT DETECTED
Opiates: NOT DETECTED
Tetrahydrocannabinol: NOT DETECTED

## 2019-03-18 LAB — ETHANOL: Alcohol, Ethyl (B): <10 mg/dL (ref <10)

## 2019-03-18 IMAGING — CT CT HEAD WITHOUT CONTRAST
3 series · 15 of 47 positions shown, 18 images · non-contrast
Comparison: [DATE]

CLINICAL DATA: Altered level of consciousness with blurred vision
and dizziness. Vomiting

EXAM:
CT HEAD WITHOUT CONTRAST
TECHNIQUE: Contiguous axial images were obtained from the base of the skull
through the vertex without intravenous contrast.

[Series 2: head wo · axial · 0.43mm/px · z∈[+1207,+1342]mm · 9 of 33 slices shown, 12 images]
[im 3/33  brain]
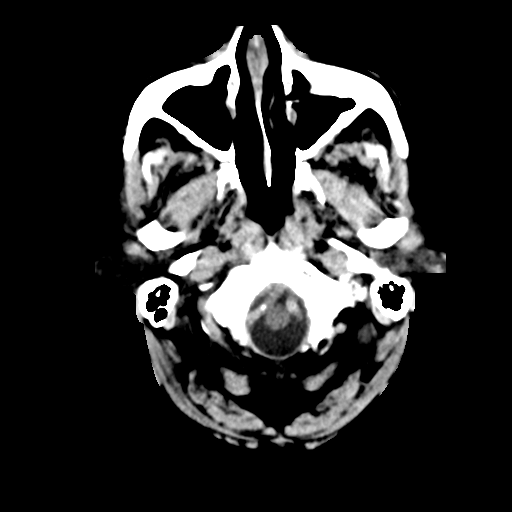
[im 3/33  bone]
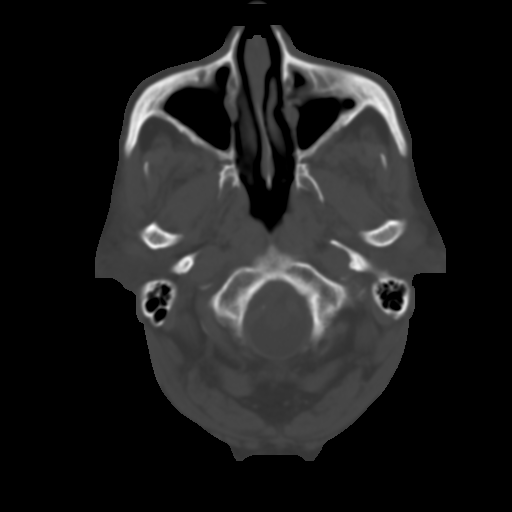
[im 6/33  brain]
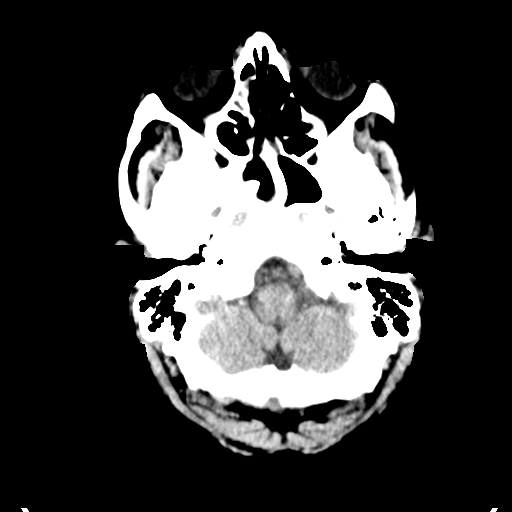
[im 9/33  brain]
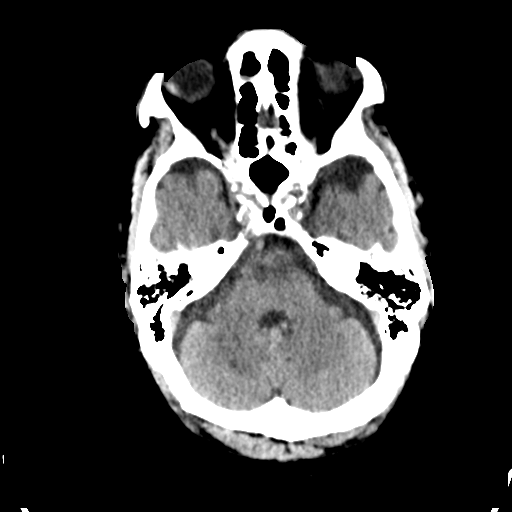
[im 13/33  brain]
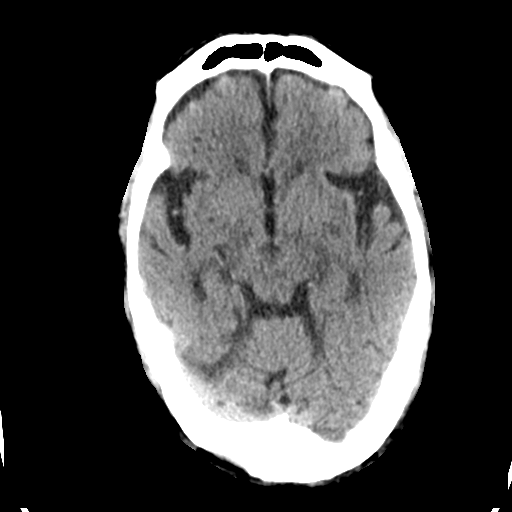
[im 17/33  brain]
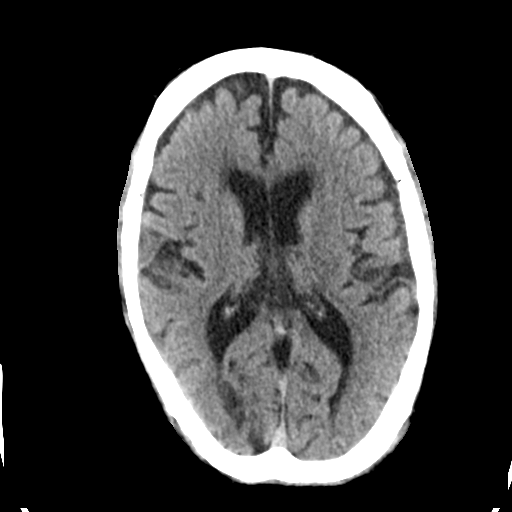
[im 17/33  bone]
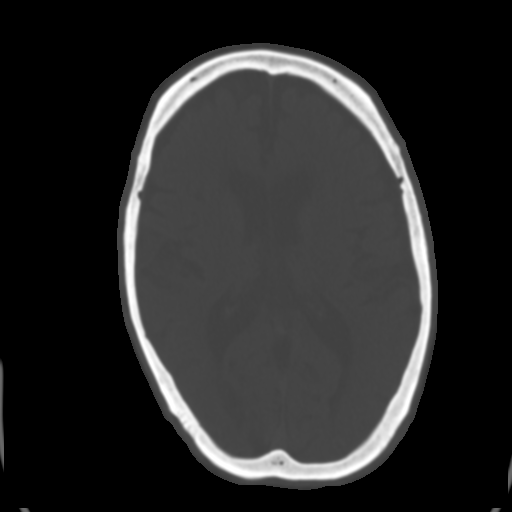
[im 20/33  brain]
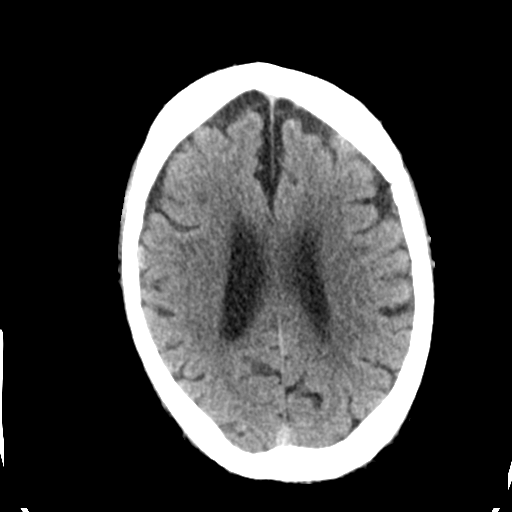
[im 24/33  brain]
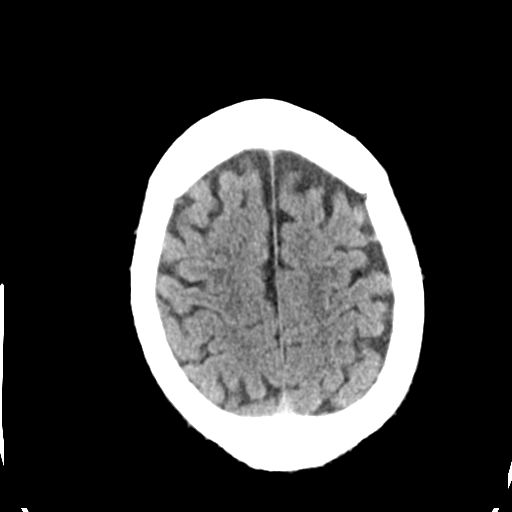
[im 27/33  brain]
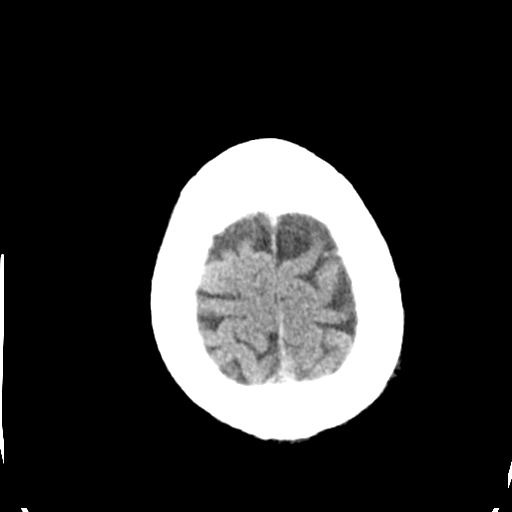
[im 30/33  brain]
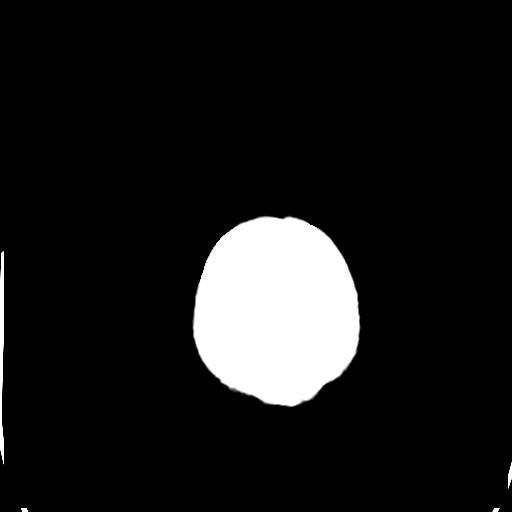
[im 30/33  bone]
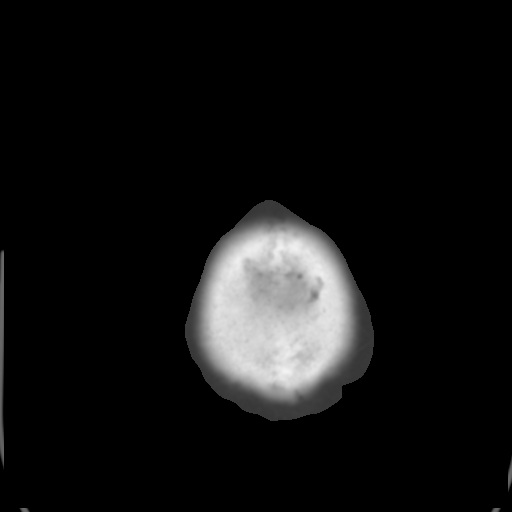

[Series 4: coronal soft tissue · coronal · 0.31mm/px · 3 of 67 slices shown]
[im 23/67  brain]
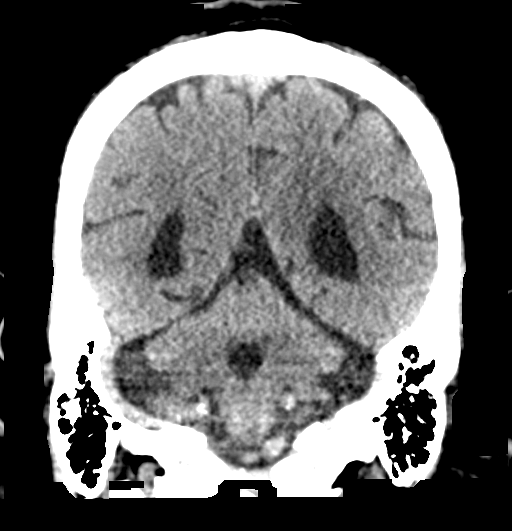
[im 30/67  brain]
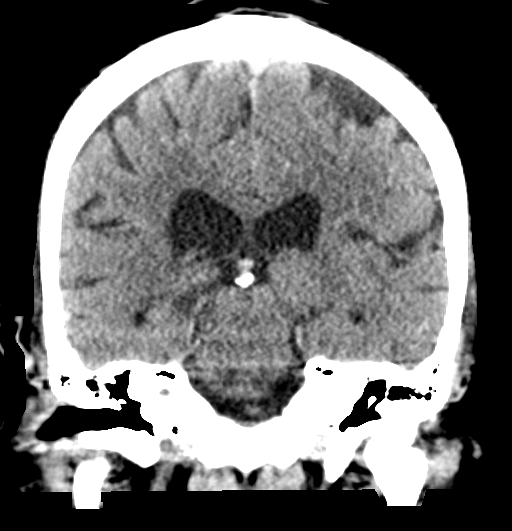
[im 37/67  brain]
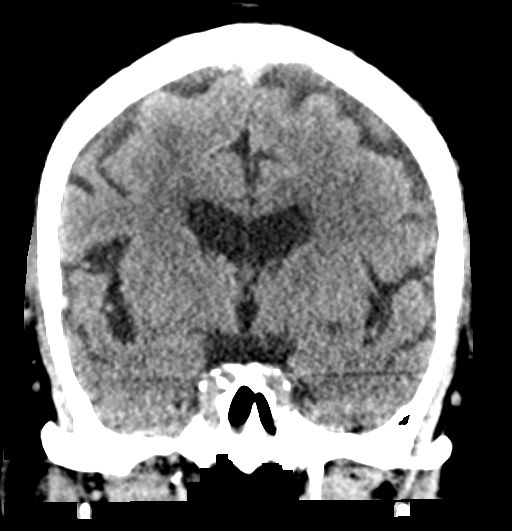

[Series 5: sagittal soft tissue · sagittal · 0.32mm/px · 3 of 54 slices shown]
[im 18/54  brain]
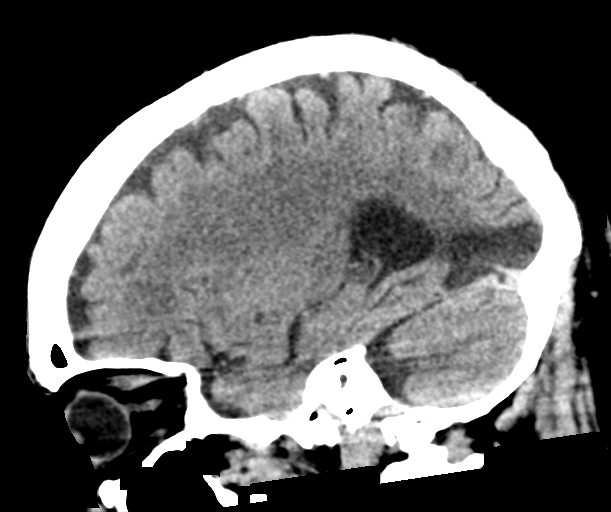
[im 27/54  brain]
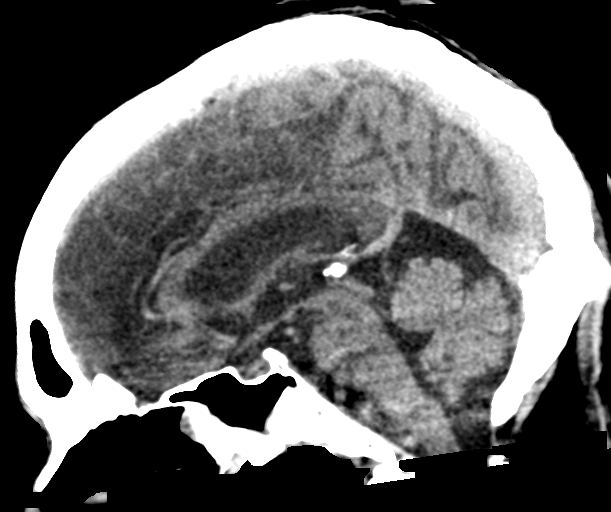
[im 36/54  brain]
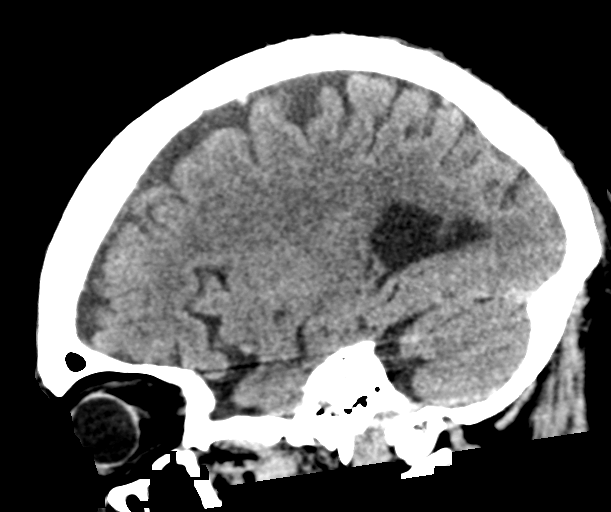

[15 of 47 positions shown; findings below may reference images not displayed]

FINDINGS: Brain: There is mild diffuse atrophy. There is no intracranial mass,
hemorrhage, extra-axial fluid collection, or midline shift. There is
evidence of a prior infarct in the right occipital lobe. There is
patchy small vessel disease in the centra semiovale bilaterally.
There is evidence of a prior lacunar infarct in the inferior
anterior left centrum semiovale slightly superior to the left basal
ganglia. No acute infarct is demonstrable on this study.

Vascular: There is no appreciable hyperdense vessel. There is
calcification in each carotid siphon region.

Skull: Bony calvarium appears intact.

Sinuses/Orbits: There is mucosal thickening in each maxillary
antrum. There is mucosal thickening and opacification in multiple
ethmoid air cells, more severe on the right than on the left as well
as mucosal thickening in the sphenoid sinuses, more on the right
than on the left. Orbits appear symmetric bilaterally except for
evidence of previous cataract removal on the left.

Other: Mastoid air cells are clear.
IMPRESSION: 1. Prior infarct right occipital lobe. Prior small infarct in the
anterior inferior left centrum semiovale. There is mild diffuse
atrophy with patchy periventricular small vessel disease. No acute
infarct. No mass or hemorrhage.

2.  There are foci of arterial vascular calcification.

3.  Multiple foci of paranasal sinus disease.

## 2019-03-18 IMAGING — MR MRI HEAD WITHOUT CONTRAST
20 of 27 series · 20 of 27 positions shown · IV contrast (Yes)
Comparison: Head CT without contrast earlier today.

CLINICAL DATA: 89-year-old male with blurred vision and dizziness
onset [65] hours today. Vomiting and loss of balance.

EXAM:
MRI HEAD WITHOUT CONTRAST
MRA NECK WITHOUT AND WITH CONTRAST
TECHNIQUE: Multiplanar, multiecho pulse sequences of the brain and surrounding
structures were obtained without intravenous contrast.
Angiographic images of the neck were obtained using MRA technique
without and with intravenous contrast. Carotid stenosis measurements
(when applicable) are obtained utilizing NASCET criteria, using the
distal internal carotid diameter as the denominator.
CONTRAST:  8 milliliters Gadavist

[Series 3: T1 · sagittal · 5.0mm · 0.47mm/px · 1 of 20 slices shown]
[im 1/20]
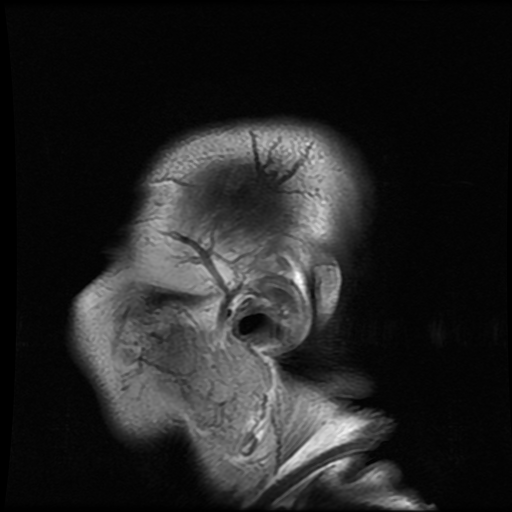

[Series 5: T2 · axial · 5.0mm · 0.86mm/px · 1 of 21 slices shown (1 of 2)]
[im 1/21]
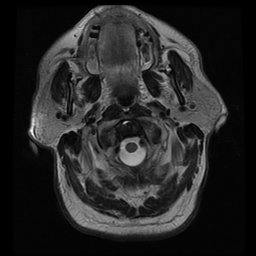

[Series 6: FLAIR · axial · 3.0mm · 0.43mm/px · 1 of 25 slices shown]
[im 1/25]
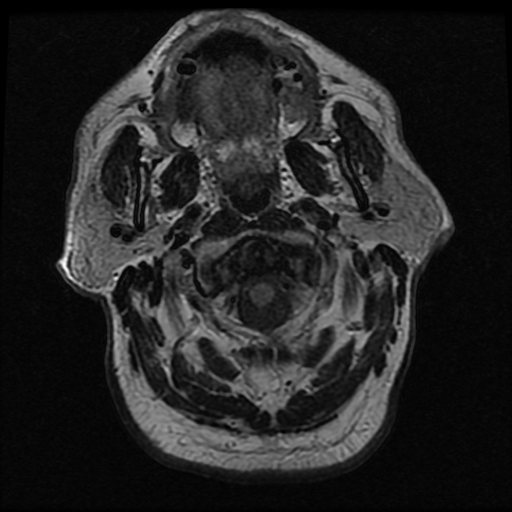

[Series 7: T2-star · axial · 3.0mm · 0.43mm/px · 1 of 29 slices shown]
[im 1/29]
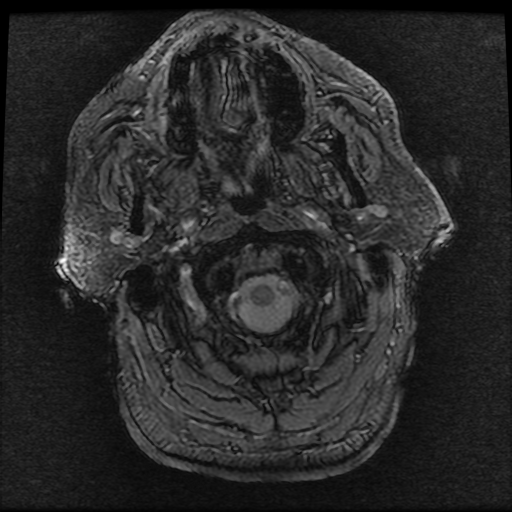

[Series 9: DWI · coronal · 4.0mm · 1.09mm/px · 1 of 86 slices shown (1 of 2)]
[im 1/86]
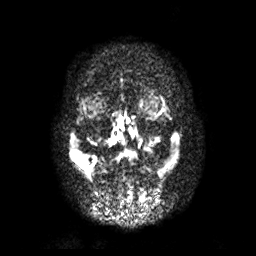

[Series 10: T2 · coronal · 5.0mm · 0.90mm/px · 1 of 27 slices shown (2 of 2)]
[im 1/27]
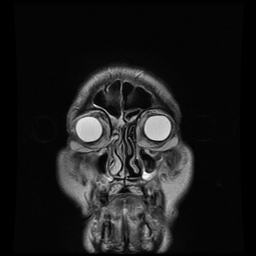

[Series 15: ax inhance · axial · 3.0mm · 0.55mm/px · 1 of 148 slices shown]
[im 1/148]
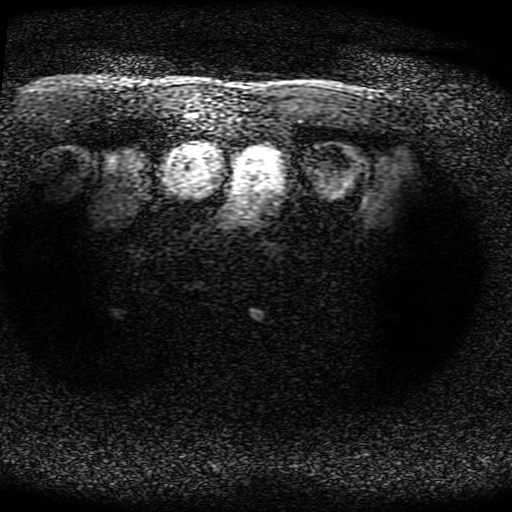

[Series 900: DWI · coronal · 4.0mm · 1.09mm/px · 1 of 43 slices shown (2 of 2)]
[im 1/43]
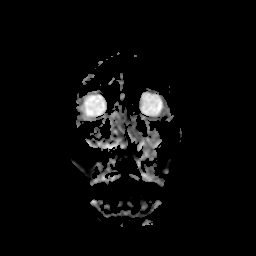

[Series 1500: pjn · coronal · 3.0mm · 0.39mm/px · 1 of 13 slices shown (1 of 6)]
[im 1/13]
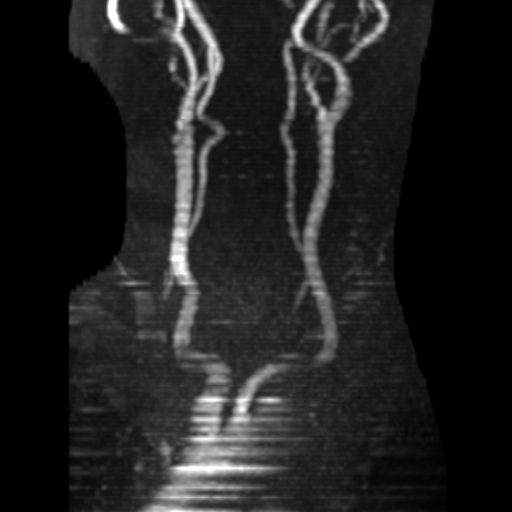

[Series 1501: pjn · coronal · 3.0mm · 0.39mm/px · 1 of 15 slices shown (2 of 6)]
[im 1/15]
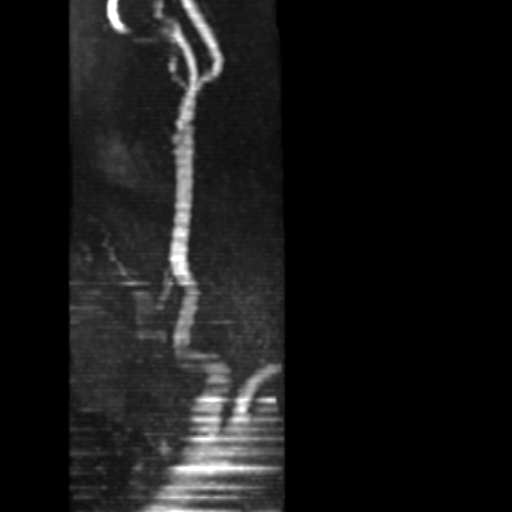

[Series 1503: pjn · coronal · 3.0mm · 0.39mm/px · 1 of 16 slices shown (3 of 6)]
[im 1/16]
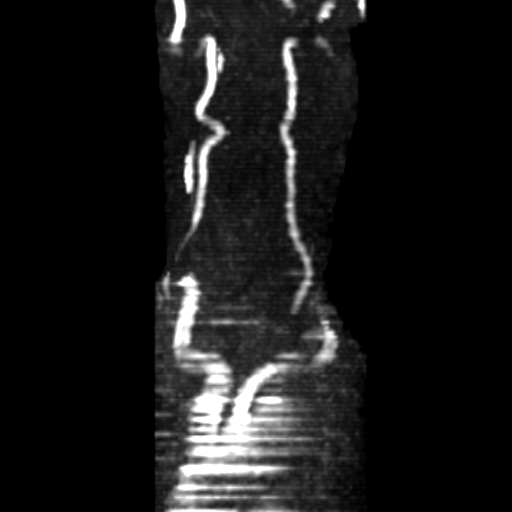

[Series 1504: pjn · coronal · 3.0mm · 0.39mm/px · 1 of 16 slices shown (4 of 6)]
[im 1/16]
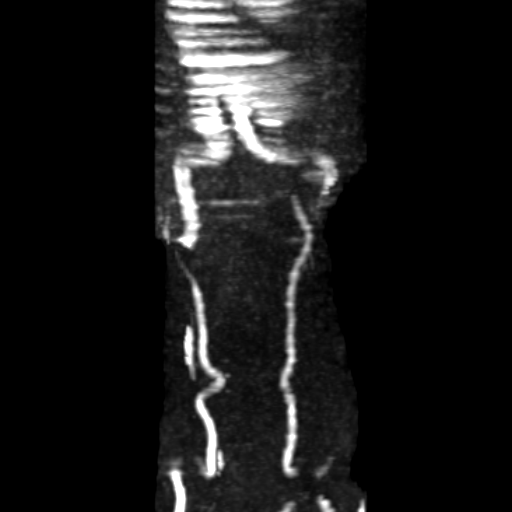

[Series 1505: pjn · coronal · 3.0mm · 0.39mm/px · 1 of 12 slices shown (5 of 6)]
[im 1/12]
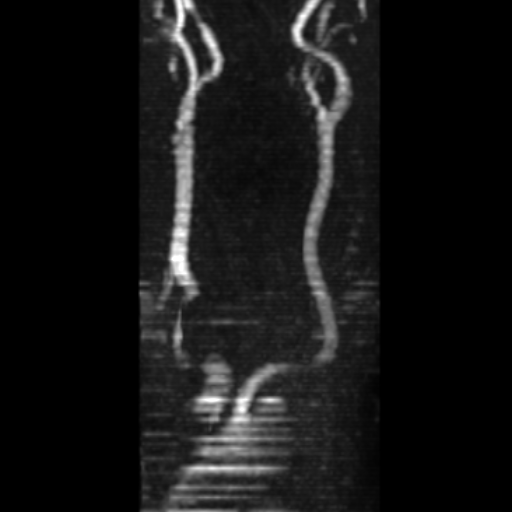

[Series 1600: MRA · coronal · 1.4mm · 0.59mm/px · 1 of 92 slices shown (1 of 3)]
[im 1/92]
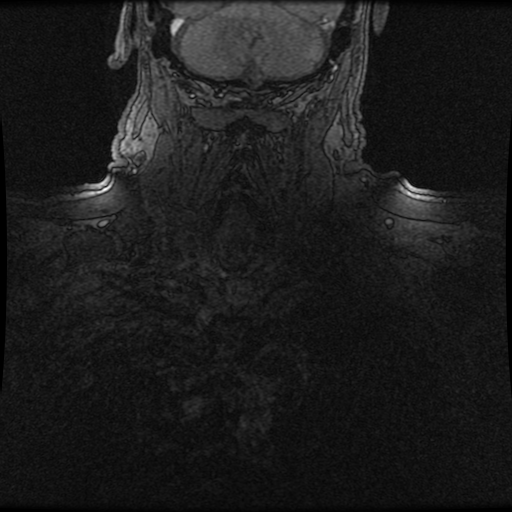

[Series 1601: MRA · coronal · 1.4mm · 0.59mm/px · 1 of 92 slices shown (2 of 3)]
[im 1/92]
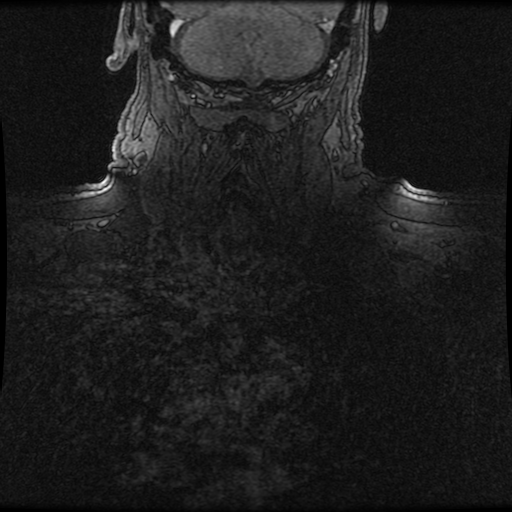

[Series 1602: MRA · coronal · 1.4mm · 0.59mm/px · 1 of 92 slices shown (3 of 3)]
[im 1/92]
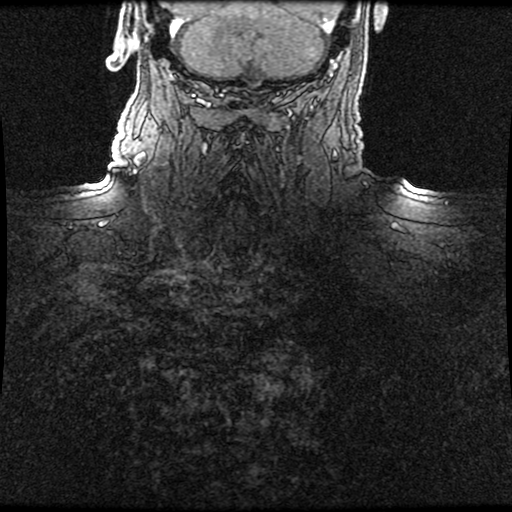

[((id)/(id)/1)-((id)/(id)/1) · coronal · 1.4mm · 0.59mm/px · 1 of 91 slices shown (1 of 3)]
[im 1/91]
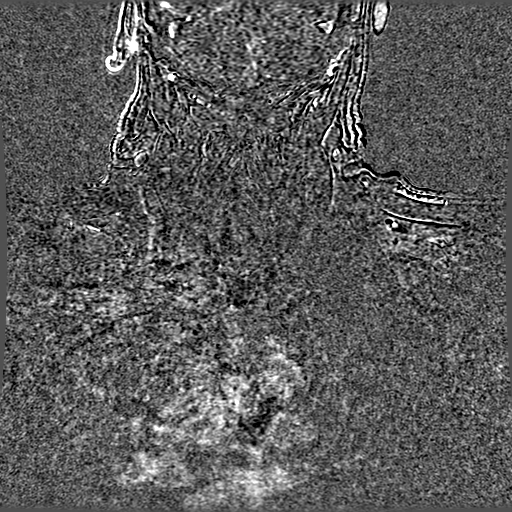

[((id)/(id)/1)-((id)/(id)/1) · coronal · 1.4mm · 0.59mm/px · 1 of 92 slices shown (2 of 3)]
[im 1/92]
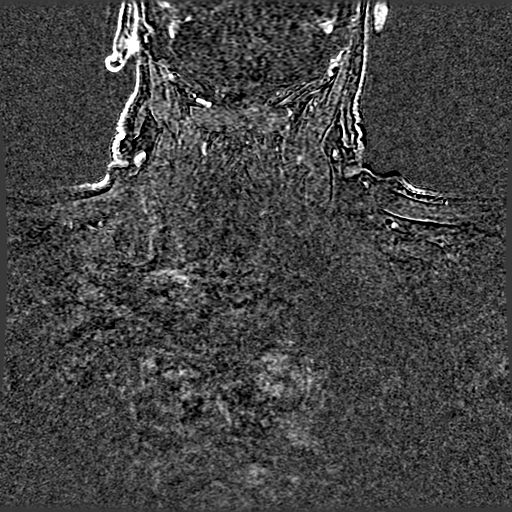

[((id)/(id)/1)-((id)/(id)/1) · coronal · 1.4mm · 0.59mm/px · 1 of 92 slices shown (3 of 3)]
[im 1/92]
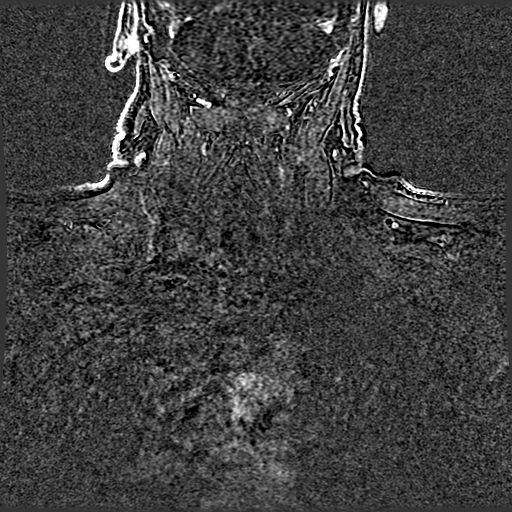

[pjn · coronal · 1.4mm · 0.39mm/px · 1 of 7 slices shown (6 of 6)]
[im 1/7]
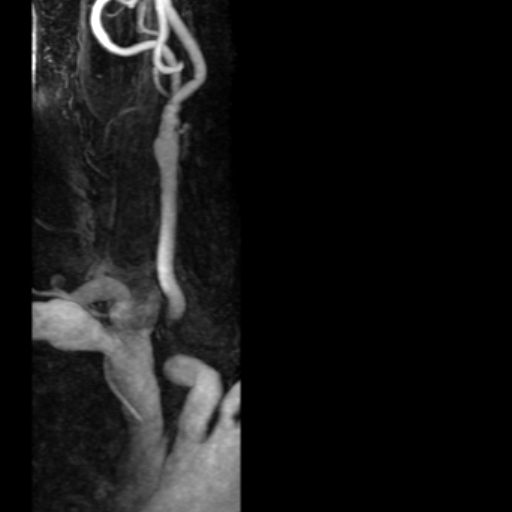

[20 of 27 positions shown; findings below may reference images not displayed]

FINDINGS: MRI HEAD FINDINGS

Brain: No restricted diffusion or evidence of acute infarction.

Chronic encephalomalacia in the right PCA territory with trace
hemosiderin. Small chronic white matter infarct in the left corona
radiata.

Minor T2 heterogeneity in the deep gray matter nuclei. Small chronic
lacunar infarct in the right pons (series 5, image 7). Possible
occasional tiny chronic infarcts in the cerebellum (series 5, image
4). Superimposed mild for age cerebral white matter T2 and FLAIR
hyperintensity.

No other cortical encephalomalacia or chronic blood products
identified.

No midline shift, mass effect, evidence of mass lesion,
ventriculomegaly, extra-axial collection or acute intracranial
hemorrhage. Cervicomedullary junction and pituitary are within
normal limits.

Vascular: Major intracranial vascular flow voids are preserved.

Skull and upper cervical spine: Partially visible advanced cervical
spine disc and endplate degeneration. Visualized bone marrow signal
is within normal limits.

Sinuses/Orbits: Postoperative changes to the left globe, otherwise
negative orbits. Mild paranasal sinus mucosal thickening.

Other: Mastoids are clear. Visible internal auditory structures
appear normal. Scalp and face soft tissues appear negative.

MRA NECK FINDINGS

Precontrast time-of-flight images demonstrate antegrade flow in both
cervical carotid and vertebral arteries. Evidence of a 3 vessel arch
configuration. Codominant vertebral arteries. Antegrade flow
continues to the skull base.

Post-contrast neck MRA images reveal a 3 vessel arch configuration
with proximal great vessel atherosclerosis.

Tortuous right CCA and right ICA in the neck. Irregularity at the
right ICA bulb compatible with atherosclerosis but no
hemodynamically significant stenosis. Visible right ICA siphon is
patent.

No significant proximal left CCA stenosis. Tortuous left CCA.
Negative cervical left ICA aside from tortuosity. Visible left ICA
siphon is patent.

Evidence of high-grade stenosis at the right subclavian artery
origin on series [65], image 28 might be in part related to
tortuosity with a kinked appearance. The right vertebral artery
origin is normal. The right vertebral is patent to the skull base
without stenosis. There is mild stenosis at the vertebrobasilar
junction on series [65], image 47.

No proximal left subclavian artery stenosis despite plaque. The left
vertebral artery origin is almost excluded from the slab but seems
to remain normal. No left vertebral artery stenosis in the neck.
There is mild-to-moderate left vertebral V4 segment stenosis just
proximal to the patent PICA origin on series [65], image 39. Mild
stenosis at the left vertebrobasilar junction.
IMPRESSION: 1.  No acute intracranial abnormality.
2. Chronic right PCA territory infarct. Chronic lacunar infarcts in
the right pons, left corona radiata and perhaps also in the
cerebellum.
3. Neck MRA is negative for Cervical Carotid stenosis, but suggests
high-grade stenosis at the Right Subclavian Artery origin, and up to
moderate stenosis of the distal Left Vertebral Artery (V4 segment).

## 2019-03-18 MED ORDER — MECLIZINE HCL 25 MG PO TABS
25.0000 mg | ORAL_TABLET | Freq: Once | ORAL | Status: AC
  Administered 2019-03-18: 25 mg via ORAL
  Filled 2019-03-18: qty 1

## 2019-03-18 MED ORDER — ACETAMINOPHEN 325 MG PO TABS
650.0000 mg | ORAL_TABLET | ORAL | Status: DC | PRN
  Administered 2019-03-19 (×2): 650 mg via ORAL
  Filled 2019-03-18 (×2): qty 2

## 2019-03-18 MED ORDER — HEPARIN SODIUM (PORCINE) 5000 UNIT/ML IJ SOLN
5000.0000 [IU] | Freq: Three times a day (TID) | INTRAMUSCULAR | Status: DC
  Administered 2019-03-19 – 2019-03-21 (×8): 5000 [IU] via SUBCUTANEOUS
  Filled 2019-03-18 (×8): qty 1

## 2019-03-18 MED ORDER — ONDANSETRON HCL 4 MG/2ML IJ SOLN
4.0000 mg | Freq: Once | INTRAMUSCULAR | Status: AC
  Administered 2019-03-18: 4 mg via INTRAVENOUS
  Filled 2019-03-18: qty 2

## 2019-03-18 MED ORDER — INSULIN ASPART 100 UNIT/ML ~~LOC~~ SOLN
0.0000 [IU] | Freq: Three times a day (TID) | SUBCUTANEOUS | Status: DC
  Administered 2019-03-21: 1 [IU] via SUBCUTANEOUS

## 2019-03-18 MED ORDER — ACETAMINOPHEN 160 MG/5ML PO SOLN: 650.0000 mg | ORAL | Status: DC | PRN

## 2019-03-18 MED ORDER — ACETAMINOPHEN 650 MG RE SUPP: 650.0000 mg | RECTAL | Status: DC | PRN

## 2019-03-18 MED ORDER — STROKE: EARLY STAGES OF RECOVERY BOOK
Freq: Once | Status: AC
  Administered 2019-03-20: 09:00:00
  Filled 2019-03-18 (×2): qty 1

## 2019-03-18 MED ORDER — GADOBUTROL 1 MMOL/ML IV SOLN
8.0000 mL | Freq: Once | INTRAVENOUS | Status: AC | PRN
  Administered 2019-03-18: 8 mL via INTRAVENOUS

## 2019-03-18 MED ORDER — INSULIN ASPART 100 UNIT/ML ~~LOC~~ SOLN: 0.0000 [IU] | Freq: Every day | SUBCUTANEOUS | Status: DC

## 2019-03-18 MED ORDER — ASPIRIN 300 MG RE SUPP: 300.0000 mg | Freq: Every day | RECTAL | Status: DC

## 2019-03-18 MED ORDER — ASPIRIN 325 MG PO TABS
325.0000 mg | ORAL_TABLET | Freq: Every day | ORAL | Status: DC
  Administered 2019-03-19 – 2019-03-21 (×3): 325 mg via ORAL
  Filled 2019-03-18 (×3): qty 1

## 2019-03-18 MED ORDER — SODIUM CHLORIDE 0.9 % IV SOLN: INTRAVENOUS | Status: DC

## 2019-03-18 NOTE — ED Notes (Signed)
Report given to [place], Therapist, sports on 5W at [facility].

## 2019-03-18 NOTE — ED Notes (Signed)
MRI called and requested transport to MRI, tech to transport.

## 2019-03-18 NOTE — ED Notes (Signed)
Patient transported to MRI via stretcher.

## 2019-03-18 NOTE — ED Provider Notes (Signed)
[place] DEPT Provider Note   CSN: [id number] Arrival date & time: 03/18/19  1442    History   Chief Complaint Chief Complaint  Patient presents with   Weakness    HPI Naeem Quillin is a 85 y.o. male.      Weakness  Patient presents to the emergency room for evaluation of acute vertigo.  Patient states he noticed the symptoms earlier this morning at about 10 AM.  He started having trouble with nausea vomiting and dizziness.  Patient also feels that his vision is blurry.  Patient states that he feels off balance and his legs feel weak.  He feels like he is going to fall when he tries to walk.  He denies any trouble with his speech.  No numbness.  He denies any headache.  He denies any previous symptoms similar to this. Past Medical History:  Diagnosis Date   ED (erectile dysfunction)    Hyperlipidemia    Hypertension    Hypothyroidism    Psoriasis    Varicose veins    Vitamin D deficiency     Patient Active Problem List   Diagnosis Date Noted   Neck swelling 07/26/2018   Elevated PSA 12/16/2014   Hypokalemia 12/16/2014   Hyperglycemia 12/15/2013   Renal insufficiency 12/15/2013   Dyspnea 12/15/2013   Hand pain, right 12/15/2013   Screening for cancer 12/05/2012   Encounter for long-term (current) use of other medications 12/05/2011   Wellness examination 12/05/2011   Osteoarthritis 12/05/2011   Cough 12/05/2011   ERECTILE DYSFUNCTION, NON-ORGANIC 11/09/2009   Vitamin D deficiency 08/13/2008   VARICOSE VEINS, LOWER EXTREMITIES 08/13/2008   UNS ADVRS EFF OTH RX MEDICINAL&BIOLOGICAL SBSTNC 08/13/2008   Hypothyroidism 05/02/2007   PSORIASIS 05/02/2007   Dyslipidemia 03/12/2007   Essential hypertension 03/12/2007    Past Surgical History:  Procedure Laterality Date   JOINT REPLACEMENT     joint replacement rt and left     rotator cuff repair, right shoulder per Dr. Tonita Cong  1996   torn right  bicepts muscle  [place]  01/17/10   redone on right hip on 09/05/10        Home Medications    Prior to Admission medications   Medication Sig Start Date End Date Taking? Authorizing Provider  aspirin 325 MG tablet Take 0.5 tablet once a day    [provider]  atorvastatin (LIPITOR) 80 MG tablet TAKE 1 TABLET DAILY 07/24/17   Renato Shin, MD  budesonide-formoterol [facility]) 80-4.5 MCG/ACT inhaler Inhale 2 puffs into the lungs 2 (two) times daily. 12/03/17   Renato Shin, MD  clobetasol ointment (TEMOVATE) 0.05 % APPLY TOPICALLY TWICE A DAY 10/21/18   Isaac Bliss, Rayford Halsted, MD  diclofenac sodium (VOLTAREN) 1 % GEL APPLY 4 GRAMS TOPICALLY FOUR TIMES A DAY AS NEEDED FOR KNEE PAIN 04/22/18   Renato Shin, MD  hydrochlorothiazide (HYDRODIURIL) 12.5 MG tablet TAKE 1 TABLET DAILY 08/18/16   Renato Shin, MD  niacin 500 MG tablet Take 500 mg by mouth daily.    [provider]  omega-3 acid ethyl esters (LOVAZA) 1 G capsule Take 2 capsules (2 g total) by mouth 2 (two) times daily. 03/11/13 03/11/14  Renato Shin, MD  sildenafil (VIAGRA) 100 MG tablet TAKE 1 TABLET AS NEEDED ONE HOUR BEFORE NEEDED FOR ERECTILE DYSFUNCTION 04/22/18   Renato Shin, MD  TRICOR 48 MG tablet TAKE 1 TABLET DAILY 04/22/18   Renato Shin, MD  Family History Family History  Problem Relation Age of Onset   Breast cancer Other     Social History Social History   Tobacco Use   Smoking status: Former Smoker   Smokeless tobacco: Never Used  Substance Use Topics   Alcohol use: No   Drug use: No     Allergies   Patient has no known allergies.   Review of Systems Review of Systems  Neurological: Positive for weakness.  All other systems reviewed and are negative.    Physical Exam Updated Vital Signs BP (!) 167/110    Pulse 80    Temp 98.2 F (36.8 C) (Oral)    Resp 15    Ht 1.778 m (5\' 10" )    Wt 79.4 kg    SpO2 96%    BMI 25.11 kg/m   Physical  Exam Vitals signs and nursing note reviewed.  Constitutional:      General: He is not in acute distress.    Appearance: He is well-developed.  HENT:     Head: Normocephalic and atraumatic.     Right Ear: External ear normal.     Left Ear: External ear normal.  Eyes:     General: No scleral icterus.       Right eye: No discharge.        Left eye: No discharge.     Conjunctiva/sclera: Conjunctivae normal.  Neck:     Musculoskeletal: Neck supple.     Trachea: No tracheal deviation.  Cardiovascular:     Rate and Rhythm: Normal rate and regular rhythm.  Pulmonary:     Effort: Pulmonary effort is normal. No respiratory distress.     Breath sounds: Normal breath sounds. No stridor. No wheezing or rales.  Abdominal:     General: Bowel sounds are normal. There is no distension.     Palpations: Abdomen is soft.     Tenderness: There is no abdominal tenderness. There is no guarding or rebound.  Musculoskeletal:        General: No tenderness.  Skin:    General: Skin is warm and dry.     Findings: No rash.  Neurological:     Mental Status: He is alert and oriented to person, place, and time.     Cranial Nerves: No cranial nerve deficit (No facial droop, extraocular movements intact, tongue midline ).     Sensory: No sensory deficit.     Motor: No abnormal muscle tone or seizure activity.     Coordination: Coordination normal.     Comments: No pronator drift bilateral upper extrem, able to hold both legs off bed for 5 seconds, sensation intact in all extremities, no visual field cuts, no left or right sided neglect, abnormal finger-nose exam bilaterally,  nystagmus noted       ED Treatments / Results  Labs (all labs ordered are listed, but only abnormal results are displayed) Labs Reviewed  CBC - Abnormal; Notable for the following components:      Result Value   WBC 10.7 (*)    All other components within normal limits  COMPREHENSIVE METABOLIC PANEL - Abnormal; Notable for the  following components:   Glucose, Bld 126 (*)    BUN 32 (*)    Creatinine, Ser 1.83 (*)    Total Bilirubin 1.5 (*)    GFR calc non Af Amer 32 (*)    GFR calc Af Amer 37 (*)    All other components within normal limits  SARS CORONAVIRUS 2  ETHANOL  PROTIME-INR  APTT  DIFFERENTIAL  RAPID URINE DRUG SCREEN, HOSP PERFORMED  URINALYSIS, ROUTINE W REFLEX MICROSCOPIC  I-STAT CHEM 8, ED    EKG None  Radiology Ct Head Wo Contrast  Result Date: 03/18/2019 CLINICAL DATA:  Altered level of consciousness with blurred vision and dizziness. Vomiting EXAM: CT HEAD WITHOUT CONTRAST TECHNIQUE: Contiguous axial images were obtained from the base of the skull through the vertex without intravenous contrast. COMPARISON:  August 30, 2007 FINDINGS: Brain: There is mild diffuse atrophy. There is no intracranial mass, hemorrhage, extra-axial fluid collection, or midline shift. There is evidence of a prior infarct in the right occipital lobe. There is patchy small vessel disease in the centra semiovale bilaterally. There is evidence of a prior lacunar infarct in the inferior anterior left centrum semiovale slightly superior to the left basal ganglia. No acute infarct is demonstrable on this study. Vascular: There is no appreciable hyperdense vessel. There is calcification in each carotid siphon region. Skull: Bony calvarium appears intact. Sinuses/Orbits: There is mucosal thickening in each maxillary antrum. There is mucosal thickening and opacification in multiple ethmoid air cells, more severe on the right than on the left as well as mucosal thickening in the sphenoid sinuses, more on the right than on the left. Orbits appear symmetric bilaterally except for evidence of previous cataract removal on the left. Other: Mastoid air cells are clear. IMPRESSION: 1. Prior infarct right occipital lobe. Prior small infarct in the anterior inferior left centrum semiovale. There is mild diffuse atrophy with patchy  periventricular small vessel disease. No acute infarct. No mass or hemorrhage. 2.  There are foci of arterial vascular calcification. 3.  Multiple foci of paranasal sinus disease. Electronically Signed   By: Lowella Grip III M.D.   On: 03/18/2019 15:34    Procedures Procedures (including critical care time)  Medications Ordered in ED Medications  ondansetron (ZOFRAN) injection 4 mg (4 mg Intravenous Given 03/18/19 1556)  meclizine (ANTIVERT) tablet 25 mg (25 mg Oral Given 03/18/19 1556)     Initial Impression / Assessment and Plan / ED Course  I have reviewed the triage vital signs and the nursing notes.  Pertinent labs & imaging results that were available during my care of the patient were reviewed by me and considered in my medical decision making (see chart for details).  Clinical Course as of Mar 17 1721  Tue Mar 18, 2019  1644 CT scan with evidence of prior infarct.  No definite acute infarct   [JK]  1644 Labs reviewed.  Creatinine chronically elevated.  CBC unremarkable.   [ZT]  2458 Patient continues to feel dizzy despite medications provided in the ED.     [JK]    Clinical Course User Index [JK] Dorie Rank, MD     Patient presented to the ED for evaluation of acute dizziness and vomiting.  Patient had mild deficits on exam with abnormal finger-to-nose exam.  LVO screen is negative.  Patient is outside TPA window.  Symptoms may be related from peripheral vertigo versus posterior circulation stroke causing his dizziness and vomiting.  Plan admission to the hospital for further treatment.  Will require MRI for further evaluation.  Final Clinical Impressions(s) / ED Diagnoses   Final diagnoses:  Dizziness  Nausea and vomiting, intractability of vomiting not specified, unspecified vomiting type      Dorie Rank, MD 03/18/19 1722

## 2019-03-18 NOTE — ED Notes (Signed)
[facility] at bedside 

## 2019-03-18 NOTE — ED Notes (Signed)
[facility] called for transport 

## 2019-03-18 NOTE — H&P (Signed)
History and Physical    Brendan Long TSV:[id number] DOB: 12-29-1928 DOA: 03/18/2019  PCP: Isaac Bliss, Rayford Halsted, MD  Patient coming from: Home.  Lives alone.  Chief Complaint: "Swimmy head"  HPI: Brendan Long is a 85 y.o. male with history of arrhythmia status post heart ablation 15 years ago not on anticoagulation, HTN, prediabetes, COPD, elevated PSA and CKD-3 and hypothyroidism presenting to ED with sudden onset of "swimmy headed" and blurry vision.   Patient was in his usual state of health up until 10:30 AM when he suddenly felt swimmy head. He thought he might have been dehydrated and went to drink some water that he threw back up.  He felt diaphoretic.  He had blurry vision as well.  Had bilateral pounding headache associated with this.  He felt things moving and difficult to stand.  He talked to his friend who drove him to emergency department.  He continues to have a swimmy head with little movement.  Continues to have bilateral temporal pounding headache.  Blurry vision resolved.  Continues to have nausea and vomiting.  He denies eye pain, tinnitus, focal weakness, numbness, tingling or speech change.  Never had similar symptoms in the past.  He denies recent illness, fever, chills, chest pain, dyspnea, palpitation, unintentional weight loss, UTI symptoms, new medication or food.  No known contact with COVID-19.  Reports recent cataract surgery of left eye 3 weeks ago at [place].  Reports history of fast heart rate.  He states he had ablation about 15 years ago.  Has been on metoprolol but no anticoagulation.   Lives alone.  Denies smoking cigarettes, drinking alcohol or recreational drug use.  BP elevated to 178/117.  WBC 11.  Creatinine 1.83 (baseline 1.5-1.6).  BUN 32.  Total bili 1.5.  EtOH level less than 10.  EKG ordered but not obtained.  CT head without contrast with prior infarct of right occipital lobe and anterior inferior left centrum semiovale but no acute infarct or  mass or hemorrhage.  Was given meclizine and Zofran without improvement in his dizziness and hospitalist service was called for admission. I have requested EDP to consult neurology.  Per EDP, neurology recommended MRI, and agreed admission to [facility].  ROS All review of system negative except for pertinent positives and negatives as history of present illness above.  PMH Past Medical History:  Diagnosis Date  . ED (erectile dysfunction)   . Hyperlipidemia   . Hypertension   . Hypothyroidism   . Psoriasis   . Varicose veins   . Vitamin D deficiency    PSH Past Surgical History:  Procedure Laterality Date  . JOINT REPLACEMENT    . joint replacement rt and left    . rotator cuff repair, right shoulder per Dr. Tonita Cong  1996  . torn right bicepts muscle  1996  . TOTAL HIP ARTHROPLASTY  01/17/10   redone on right hip on 09/05/10   Fam HX Family History  Problem Relation Age of Onset  . Breast cancer Other     Social Hx  reports that he has quit smoking. He has never used smokeless tobacco. He reports that he does not drink alcohol or use drugs.  Allergy No Known Allergies Home Meds Prior to Admission medications   Medication Sig Start Date End Date Taking? Authorizing Provider  aspirin 325 MG tablet Take 0.5 tablet once a day    [provider]  atorvastatin (LIPITOR) 80 MG tablet TAKE 1 TABLET DAILY 07/24/17  Renato Shin, MD  budesonide-formoterol [facility]) 80-4.5 MCG/ACT inhaler Inhale 2 puffs into the lungs 2 (two) times daily. 12/03/17   Renato Shin, MD  clobetasol ointment (TEMOVATE) 0.05 % APPLY TOPICALLY TWICE A DAY 10/21/18   Isaac Bliss, Rayford Halsted, MD  diclofenac sodium (VOLTAREN) 1 % GEL APPLY 4 GRAMS TOPICALLY FOUR TIMES A DAY AS NEEDED FOR KNEE PAIN 04/22/18   Renato Shin, MD  hydrochlorothiazide (HYDRODIURIL) 12.5 MG tablet TAKE 1 TABLET DAILY 08/18/16   Renato Shin, MD  niacin 500 MG tablet Take 500 mg by mouth daily.    [provider]  omega-3 acid ethyl esters (LOVAZA) 1 G capsule Take 2 capsules (2 g total) by mouth 2 (two) times daily. 03/11/13 03/11/14  Renato Shin, MD  sildenafil (VIAGRA) 100 MG tablet TAKE 1 TABLET AS NEEDED ONE HOUR BEFORE NEEDED FOR ERECTILE DYSFUNCTION 04/22/18   Renato Shin, MD  TRICOR 48 MG tablet TAKE 1 TABLET DAILY 04/22/18   Renato Shin, MD    Physical Exam: Vitals:   03/18/19 1445 03/18/19 1600 03/18/19 1615 03/18/19 1630  BP: (!) 178/117 (!) 159/98  (!) 167/110  Pulse: 92 80 81 80  Resp: 16 17 15 15   Temp: 98.2 F (36.8 C)     TempSrc: Oral     SpO2: 94% 92% 96% 96%  Weight: 79.4 kg     Height: 5\' 10"  (1.778 m)       GENERAL: No acute distress.  Appears well.  HEENT: MMM. PERRL.  Visual fields intact.  Limited EOM exam due to intolerance.  Hearing grossly intact. NECK: Supple.  No apparent JVD.  No carotid bruits. RESP:  No IWOB. Good air movement bilaterally. CVS:  RRR. Heart sounds normal.  ABD/GI/GU: Bowel sounds present. Soft. Non tender.  MSK/EXT:  Moves extremities. No apparent deformity or edema.  SKIN: no apparent skin lesion or wound NEURO: Awake, alert and oriented appropriately. Cranial nerves II-XII intact but limited EOM exam due to intolerance, motor 5/5 in all muscle groups of UE and LE bilaterally, normal tone, light sensation intact in all dermatomes of upper and lower ext bilaterally, no pronator drift, biceps, patellar reflex symmetric.  Finger-to-nose intact.  Patient had significant dizziness lying back after he sat up for exam. PSYCH: Calm. Normal affect.   Personally Reviewed Radiological Exams Ct Head Wo Contrast  Result Date: 03/18/2019 CLINICAL DATA:  Altered level of consciousness with blurred vision and dizziness. Vomiting EXAM: CT HEAD WITHOUT CONTRAST TECHNIQUE: Contiguous axial images were obtained from the base of the skull through the vertex without intravenous contrast. COMPARISON:  August 30, 2007 FINDINGS: Brain: There is mild diffuse  atrophy. There is no intracranial mass, hemorrhage, extra-axial fluid collection, or midline shift. There is evidence of a prior infarct in the right occipital lobe. There is patchy small vessel disease in the centra semiovale bilaterally. There is evidence of a prior lacunar infarct in the inferior anterior left centrum semiovale slightly superior to the left basal ganglia. No acute infarct is demonstrable on this study. Vascular: There is no appreciable hyperdense vessel. There is calcification in each carotid siphon region. Skull: Bony calvarium appears intact. Sinuses/Orbits: There is mucosal thickening in each maxillary antrum. There is mucosal thickening and opacification in multiple ethmoid air cells, more severe on the right than on the left as well as mucosal thickening in the sphenoid sinuses, more on the right than on the left. Orbits appear symmetric bilaterally except for evidence of previous cataract removal on the left.  Other: Mastoid air cells are clear. IMPRESSION: 1. Prior infarct right occipital lobe. Prior small infarct in the anterior inferior left centrum semiovale. There is mild diffuse atrophy with patchy periventricular small vessel disease. No acute infarct. No mass or hemorrhage. 2.  There are foci of arterial vascular calcification. 3.  Multiple foci of paranasal sinus disease. Electronically Signed   By: Lowella Grip III M.D.   On: 03/18/2019 15:34     Personally Reviewed Labs: CBC: Recent Labs  Lab 03/18/19 1528  WBC 10.7*  NEUTROABS 7.4  HGB 15.5  HCT 48.4  MCV 95.1  PLT 027   Basic Metabolic Panel: Recent Labs  Lab 03/18/19 1528  NA 139  K 3.8  CL 105  CO2 22  GLUCOSE 126*  BUN 32*  CREATININE 1.83*  CALCIUM 9.3   GFR: Estimated Creatinine Clearance: 28.8 mL/min (A) (by C-G formula based on SCr of 1.83 mg/dL (H)). Liver Function Tests: Recent Labs  Lab 03/18/19 1528  AST 19  ALT 16  ALKPHOS 82  BILITOT 1.5*  PROT 7.6  ALBUMIN 4.0   No  results for input(s): LIPASE, AMYLASE in the last 168 hours. No results for input(s): AMMONIA in the last 168 hours. Coagulation Profile: Recent Labs  Lab 03/18/19 1528  INR 0.9   Cardiac Enzymes: No results for input(s): CKTOTAL, CKMB, CKMBINDEX, TROPONINI in the last 168 hours. BNP (last 3 results) No results for input(s): PROBNP in the last 8760 hours. HbA1C: No results for input(s): HGBA1C in the last 72 hours. CBG: No results for input(s): GLUCAP in the last 168 hours. Lipid Profile: No results for input(s): CHOL, HDL, LDLCALC, TRIG, CHOLHDL, LDLDIRECT in the last 72 hours. Thyroid Function Tests: No results for input(s): TSH, T4TOTAL, FREET4, T3FREE, THYROIDAB in the last 72 hours. Anemia Panel: No results for input(s): VITAMINB12, FOLATE, FERRITIN, TIBC, IRON, RETICCTPCT in the last 72 hours. Urine analysis:    Component Value Date/Time   COLORURINE YELLOW 12/15/2016 Amanda 12/15/2016 1015   LABSPEC 1.025 12/15/2016 1015   PHURINE 6.0 12/15/2016 1015   GLUCOSEU NEGATIVE 12/15/2016 1015   HGBUR NEGATIVE 12/15/2016 1015   HGBUR negative 11/09/2009 0000   BILIRUBINUR NEGATIVE 12/15/2016 1015   BILIRUBINUR n 11/15/2010 1151   KETONESUR NEGATIVE 12/15/2016 1015   PROTEINUR 1+ 11/15/2010 1151   UROBILINOGEN 0.2 12/15/2016 1015   NITRITE NEGATIVE 12/15/2016 1015   LEUKOCYTESUR NEGATIVE 12/15/2016 1015    Sepsis Labs:  Mild leukocytosis to 11.  Personally Reviewed EKG:  EKG ordered and pending.  No arrhythmia or acute ischemic finding on bedside telemetry.  Assessment/Plan Dizziness/blurry vision/nausea/vomiting: symptoms concerning for posterior circulation CVA.  Patient has significant risk factor including previous infarcts as noted on CT head, age, HTN, prediabetes, history of arrhythmia not on anticoagulation and HLD.  Patient was not able to tolerate EOM exam due to significant dizziness.  Also significant dizziness with lying back down after he  sat up for exam.  Patient is outside TPA window.  Last normal about 10:30 AM.  -Will admit patient to telemetry bed at [facility] for neurology evaluation. -Neurology, Dr. Rory Percy consulted by EDP.  Will see patient on arrival. -Stroke pathway-telemetry, MRI/MRA, carotid Doppler, echo, risk labs, every 2 neurochecks -Stroke swallow eval, SLP, PT//OT -Full dose aspirin -Likely permissive hypertension for now. -Follow neuro recommendation.  Accelerated hypertension: BP elevated to 175/117. -Hold home medications -Permissive hypertension.  History of unknown type arrhythmia: Reports ablation about 15 years ago.  On metoprolol but  not anticoagulation. -Echocardiogram -Follow EKG -Telemetry monitoring  Prediabetes: Last A1c 6.2% in 01/2018. -CBG monitoring -Sliding scale insulin -Statin  AKI on CKD 3: Creatinine elevated to 1.83.  Baseline about 1.5-1.6. -Avoid nephrotoxic meds -Recheck in the morning  History of elevated PSA: Denies constitutional symptoms of LUTS. -Outpatient follow-up  Chronic COPD/asthma: -PRN breathing treatments  DVT prophylaxis: Subcu Lovenox  Code Status: Full code Family Communication: Patient lives alone and has no family member other than his friend.  Disposition Plan: Admit to neuro telemetry bed. Consults called: Neurology Admission status: Observation status.   Mercy Riding MD [facility]  If 7PM-7AM, please contact night-coverage [email or web address] Password [facility]  03/18/2019, 5:40 PM

## 2019-03-18 NOTE — Progress Notes (Signed)
Attempted to get pt for MRI. Called ED at 6:00, 6:10 and 6:12pm. Have not been able to speak to RN yet.

## 2019-03-18 NOTE — ED Notes (Signed)
ED TO INPATIENT HANDOFF REPORT  ED Nurse Name and Phone #: [place] G, [phone]  S Name/Age/Gender Brendan Long 85 y.o. male Room/Bed: WA25/WA25  Code Status   Code Status: Full Code  Home/SNF/Other Home Patient oriented to: self, place, time and situation Is this baseline? Yes   Triage Complete: Triage complete  Chief Complaint vomitting with fever  Triage Note Pt reports at 6270 this morning he began having blurred vision and increased dizziness. States he has multiple episodes of vomiting since with uneven balance. Hx of TIA    Allergies No Known Allergies  Level of Care/Admitting Diagnosis ED Disposition    ED Disposition Condition [facility] Area: [employer] [[id number]]  Level of Care: Telemetry Medical [104]  Covid Evaluation: Asymptomatic Screening Protocol (No Symptoms)  Diagnosis: Dizziness [350093]  Admitting Physician: Mercy Riding [[id number]  Attending Physician: Mercy Riding [id number]  Bed request comments: Neuro bed  PT Class (Do Not Modify): Observation [104]  PT Acc Code (Do Not Modify): Observation [10022]       B Medical/Surgery History Past Medical History:  Diagnosis Date  . ED (erectile dysfunction)   . Hyperlipidemia   . Hypertension   . Hypothyroidism   . Psoriasis   . Varicose veins   . Vitamin D deficiency    Past Surgical History:  Procedure Laterality Date  . JOINT REPLACEMENT    . joint replacement rt and left    . rotator cuff repair, right shoulder per Dr. Tonita Cong  1996  . torn right bicepts muscle  1996  . TOTAL HIP ARTHROPLASTY  01/17/10   redone on right hip on 09/05/10     A IV Location/Drains/Wounds Patient Lines/Drains/Airways Status   Active Line/Drains/Airways    Name:   Placement date:   Placement time:   Site:   Days:   Peripheral IV 03/18/19 Right Antecubital   03/18/19    1536    Antecubital   less than 1          Intake/Output Last 24 hours No intake or output data  in the 24 hours ending 03/18/19 2108  Labs/Imaging Results for orders placed or performed during the hospital encounter of 03/18/19 (from the past 48 hour(s))  Ethanol     Status: None   Collection Time: 03/18/19  3:28 PM  Result Value Ref Range   Alcohol, Ethyl (B) <10 <10 mg/dL    Comment: (NOTE) Lowest detectable limit for serum alcohol is 10 mg/dL. For medical purposes only. Performed at [facility], [place] [address] [place], [place] [address]   Protime-INR     Status: None   Collection Time: 03/18/19  3:28 PM  Result Value Ref Range   Prothrombin Time 12.1 11.4 - 15.2 seconds   INR 0.9 0.8 - 1.2    Comment: (NOTE) INR goal varies based on device and disease states. Performed at [facility], [place] [address], [place], [place] [address]   APTT     Status: None   Collection Time: 03/18/19  3:28 PM  Result Value Ref Range   aPTT 29 24 - 36 seconds    Comment: Performed at [facility], [place] [address] [place], [place] [address]  CBC     Status: Abnormal   Collection Time: 03/18/19  3:28 PM  Result Value Ref Range   WBC 10.7 (H) 4.0 - 10.5 K/uL   RBC 5.09 4.22 - 5.81 MIL/uL  Hemoglobin 15.5 13.0 - 17.0 g/dL   HCT 48.4 39.0 - 52.0 %   MCV 95.1 80.0 - 100.0 fL   MCH 30.5 26.0 - 34.0 pg   MCHC 32.0 30.0 - 36.0 g/dL   RDW 13.3 11.5 - 15.5 %   Platelets 185 150 - 400 K/uL   nRBC 0.0 0.0 - 0.2 %    Comment: Performed at [facility], [place] [address] [place], [place] [address]  Differential     Status: None   Collection Time: 03/18/19  3:28 PM  Result Value Ref Range   Neutrophils Relative % 70 %   Neutro Abs 7.4 1.7 - 7.7 K/uL   Lymphocytes Relative 21 %   Lymphs Abs 2.2 0.7 - 4.0 K/uL   Monocytes Relative 5 %   Monocytes Absolute 0.6 0.1 - 1.0 K/uL   Eosinophils Relative 4 %   Eosinophils Absolute 0.4 0.0 - 0.5 K/uL   Basophils Relative 0 %   Basophils Absolute 0.0 0.0 - 0.1 K/uL   Immature  Granulocytes 0 %   Abs Immature Granulocytes 0.04 0.00 - 0.07 K/uL    Comment: Performed at [facility], [place] [address], [place], [place] [address]  Comprehensive metabolic panel     Status: Abnormal   Collection Time: 03/18/19  3:28 PM  Result Value Ref Range   Sodium 139 135 - 145 mmol/L   Potassium 3.8 3.5 - 5.1 mmol/L   Chloride 105 98 - 111 mmol/L   CO2 22 22 - 32 mmol/L   Glucose, Bld 126 (H) 70 - 99 mg/dL   BUN 32 (H) 8 - 23 mg/dL   Creatinine, Ser 1.83 (H) 0.61 - 1.24 mg/dL   Calcium 9.3 8.9 - 10.3 mg/dL   Total Protein 7.6 6.5 - 8.1 g/dL   Albumin 4.0 3.5 - 5.0 g/dL   AST 19 15 - 41 U/L   ALT 16 0 - 44 U/L   Alkaline Phosphatase 82 38 - 126 U/L   Total Bilirubin 1.5 (H) 0.3 - 1.2 mg/dL   GFR calc non Af Amer 32 (L) >60 mL/min   GFR calc Af Amer 37 (L) >60 mL/min   Anion gap 12 5 - 15    Comment: Performed at [facility], [place] [address], [place],  [address]  Urine rapid drug screen (hosp performed)not at [facility]     Status: None   Collection Time: 03/18/19  7:35 PM  Result Value Ref Range   Opiates NONE DETECTED NONE DETECTED   Cocaine NONE DETECTED NONE DETECTED   Benzodiazepines NONE DETECTED NONE DETECTED   Amphetamines NONE DETECTED NONE DETECTED   Tetrahydrocannabinol NONE DETECTED NONE DETECTED   Barbiturates NONE DETECTED NONE DETECTED    Comment: (NOTE) DRUG SCREEN FOR MEDICAL PURPOSES ONLY.  IF CONFIRMATION IS NEEDED FOR ANY PURPOSE, NOTIFY LAB WITHIN 5 DAYS. LOWEST DETECTABLE LIMITS FOR URINE DRUG SCREEN Drug Class                     Cutoff (ng/mL) Amphetamine and metabolites    1000 Barbiturate and metabolites    200 Benzodiazepine                 703 Tricyclics and metabolites     300 Opiates and metabolites        300 Cocaine and metabolites        300 THC  50 Performed at [facility], [place] [address], [place], [place] [address]   Urinalysis, Routine w  reflex microscopic (not at [facility])     Status: Abnormal   Collection Time: 03/18/19  7:35 PM  Result Value Ref Range   Color, Urine YELLOW YELLOW   APPearance CLEAR CLEAR   Specific Gravity, Urine 1.014 1.005 - 1.030   pH 7.0 5.0 - 8.0   Glucose, UA NEGATIVE NEGATIVE mg/dL   Hgb urine dipstick SMALL (A) NEGATIVE   Bilirubin Urine NEGATIVE NEGATIVE   Ketones, ur NEGATIVE NEGATIVE mg/dL   Protein, ur 100 (A) NEGATIVE mg/dL   Nitrite NEGATIVE NEGATIVE   Leukocytes,Ua NEGATIVE NEGATIVE   RBC / HPF 21-50 0 - 5 RBC/hpf   WBC, UA 0-5 0 - 5 WBC/hpf   Bacteria, UA NONE SEEN NONE SEEN   Mucus PRESENT    Hyaline Casts, UA PRESENT     Comment: Performed at [facility], [place] [address], [place],  [address]   Ct Head Wo Contrast  Result Date: 03/18/2019 CLINICAL DATA:  Altered level of consciousness with blurred vision and dizziness. Vomiting EXAM: CT HEAD WITHOUT CONTRAST TECHNIQUE: Contiguous axial images were obtained from the base of the skull through the vertex without intravenous contrast. COMPARISON:  August 30, 2007 FINDINGS: Brain: There is mild diffuse atrophy. There is no intracranial mass, hemorrhage, extra-axial fluid collection, or midline shift. There is evidence of a prior infarct in the right occipital lobe. There is patchy small vessel disease in the centra semiovale bilaterally. There is evidence of a prior lacunar infarct in the inferior anterior left centrum semiovale slightly superior to the left basal ganglia. No acute infarct is demonstrable on this study. Vascular: There is no appreciable hyperdense vessel. There is calcification in each carotid siphon region. Skull: Bony calvarium appears intact. Sinuses/Orbits: There is mucosal thickening in each maxillary antrum. There is mucosal thickening and opacification in multiple ethmoid air cells, more severe on the right than on the left as well as mucosal thickening in the sphenoid sinuses, more on the right  than on the left. Orbits appear symmetric bilaterally except for evidence of previous cataract removal on the left. Other: Mastoid air cells are clear. IMPRESSION: 1. Prior infarct right occipital lobe. Prior small infarct in the anterior inferior left centrum semiovale. There is mild diffuse atrophy with patchy periventricular small vessel disease. No acute infarct. No mass or hemorrhage. 2.  There are foci of arterial vascular calcification. 3.  Multiple foci of paranasal sinus disease. Electronically Signed   By: Lowella Grip III M.D.   On: 03/18/2019 15:34    Pending Labs Unresulted Labs (From admission, onward)    Start     Ordered   03/19/19 0500  Hemoglobin A1c  Tomorrow morning,   R     03/18/19 1752   03/19/19 0500  Lipid panel  Tomorrow morning,   R    Comments: Fasting    03/18/19 1752   03/18/19 1720  SARS CORONAVIRUS 2 Nasal Swab Urine, Clean Catch  (Asymptomatic/Tier 2 Patients Labs)  Once,   STAT    Question Answer Comment  Is this test for diagnosis or screening Screening   Symptomatic for COVID-19 as defined by CDC No   Hospitalized for COVID-19 No   Admitted to ICU for COVID-19 No   Previously tested for COVID-19 No   Resident in a congregate (group) care setting No   Employed in healthcare setting No  03/18/19 1720   Signed and Held  Hemoglobin A1c  Once,   R    Comments: To assess prior glycemic control    Signed and Held          Vitals/Pain Today's Vitals   03/18/19 1845 03/18/19 1855 03/18/19 1930 03/18/19 2000  BP:  (!) 128/113 (!) 167/96 (!) 172/105  Pulse: 91 87 89 86  Resp: 15 18 (!) 21 15  Temp:      TempSrc:      SpO2: 96% 97% 97% 97%  Weight:      Height:      PainSc:        Isolation Precautions No active isolations  Medications Medications   stroke: mapping our early stages of recovery book (has no administration in time range)  0.9 %  sodium chloride infusion (has no administration in time range)  acetaminophen (TYLENOL)  tablet 650 mg (has no administration in time range)    Or  acetaminophen (TYLENOL) solution 650 mg (has no administration in time range)    Or  acetaminophen (TYLENOL) suppository 650 mg (has no administration in time range)  heparin injection 5,000 Units (has no administration in time range)  aspirin suppository 300 mg (has no administration in time range)    Or  aspirin tablet 325 mg (has no administration in time range)  ondansetron (ZOFRAN) injection 4 mg (4 mg Intravenous Given 03/18/19 1556)  meclizine (ANTIVERT) tablet 25 mg (25 mg Oral Given 03/18/19 1556)  gadobutrol (GADAVIST) 1 MMOL/ML injection 8 mL (8 mLs Intravenous Contrast Given 03/18/19 2101)    Mobility walks Low fall risk

## 2019-03-18 NOTE — Progress Notes (Signed)
RN Called at 60 and was going to have someone bring the pt. Called back at 653 unable to find anyone to bring him. Will try to get to pt sometime tonight.

## 2019-03-18 NOTE — ED Triage Notes (Signed)
Pt reports at 1030 this morning he began having blurred vision and increased dizziness. States he has multiple episodes of vomiting since with uneven balance. Hx of TIA

## 2019-03-19 ENCOUNTER — Observation Stay (HOSPITAL_BASED_OUTPATIENT_CLINIC_OR_DEPARTMENT_OTHER): Payer: Medicare Other

## 2019-03-19 ENCOUNTER — Observation Stay (HOSPITAL_COMMUNITY): Payer: Medicare Other

## 2019-03-19 DIAGNOSIS — Z8673 Personal history of transient ischemic attack (TIA), and cerebral infarction without residual deficits: Secondary | ICD-10-CM | POA: Diagnosis not present

## 2019-03-19 DIAGNOSIS — I480 Paroxysmal atrial fibrillation: Secondary | ICD-10-CM | POA: Diagnosis present

## 2019-03-19 DIAGNOSIS — H933X9 Disorders of unspecified acoustic nerve: Secondary | ICD-10-CM | POA: Diagnosis present

## 2019-03-19 DIAGNOSIS — R42 Dizziness and giddiness: Secondary | ICD-10-CM

## 2019-03-19 DIAGNOSIS — H8192 Unspecified disorder of vestibular function, left ear: Secondary | ICD-10-CM

## 2019-03-19 DIAGNOSIS — N183 Chronic kidney disease, stage 3 (moderate): Secondary | ICD-10-CM | POA: Diagnosis present

## 2019-03-19 DIAGNOSIS — G459 Transient cerebral ischemic attack, unspecified: Secondary | ICD-10-CM | POA: Diagnosis not present

## 2019-03-19 DIAGNOSIS — E785 Hyperlipidemia, unspecified: Secondary | ICD-10-CM | POA: Diagnosis present

## 2019-03-19 DIAGNOSIS — N179 Acute kidney failure, unspecified: Secondary | ICD-10-CM | POA: Diagnosis present

## 2019-03-19 DIAGNOSIS — E1122 Type 2 diabetes mellitus with diabetic chronic kidney disease: Secondary | ICD-10-CM | POA: Diagnosis present

## 2019-03-19 DIAGNOSIS — Z96641 Presence of right artificial hip joint: Secondary | ICD-10-CM | POA: Diagnosis present

## 2019-03-19 DIAGNOSIS — R972 Elevated prostate specific antigen [PSA]: Secondary | ICD-10-CM | POA: Diagnosis present

## 2019-03-19 DIAGNOSIS — N289 Disorder of kidney and ureter, unspecified: Secondary | ICD-10-CM | POA: Diagnosis present

## 2019-03-19 DIAGNOSIS — Z87891 Personal history of nicotine dependence: Secondary | ICD-10-CM | POA: Diagnosis not present

## 2019-03-19 DIAGNOSIS — Z79899 Other long term (current) drug therapy: Secondary | ICD-10-CM | POA: Diagnosis not present

## 2019-03-19 DIAGNOSIS — Z20828 Contact with and (suspected) exposure to other viral communicable diseases: Secondary | ICD-10-CM | POA: Diagnosis present

## 2019-03-19 DIAGNOSIS — E039 Hypothyroidism, unspecified: Secondary | ICD-10-CM | POA: Diagnosis present

## 2019-03-19 DIAGNOSIS — I129 Hypertensive chronic kidney disease with stage 1 through stage 4 chronic kidney disease, or unspecified chronic kidney disease: Secondary | ICD-10-CM | POA: Diagnosis present

## 2019-03-19 DIAGNOSIS — Z7982 Long term (current) use of aspirin: Secondary | ICD-10-CM | POA: Diagnosis not present

## 2019-03-19 DIAGNOSIS — I10 Essential (primary) hypertension: Secondary | ICD-10-CM | POA: Diagnosis not present

## 2019-03-19 DIAGNOSIS — Z7951 Long term (current) use of inhaled steroids: Secondary | ICD-10-CM | POA: Diagnosis not present

## 2019-03-19 DIAGNOSIS — Z803 Family history of malignant neoplasm of breast: Secondary | ICD-10-CM | POA: Diagnosis not present

## 2019-03-19 DIAGNOSIS — J449 Chronic obstructive pulmonary disease, unspecified: Secondary | ICD-10-CM | POA: Diagnosis present

## 2019-03-19 LAB — LIPID PANEL
Cholesterol: 169 mg/dL (ref 0–200)
HDL: 28 mg/dL — ABNORMAL LOW (ref >40)
LDL Cholesterol: 101 mg/dL — ABNORMAL HIGH (ref 0–99)
Total CHOL/HDL Ratio: 6 RATIO
Triglycerides: 200 mg/dL — ABNORMAL HIGH (ref <150)
VLDL: 40 mg/dL (ref 0–40)

## 2019-03-19 LAB — HEMOGLOBIN A1C
Hgb A1c MFr Bld: 5.9 % — ABNORMAL HIGH (ref 4.8–5.6)
Mean Plasma Glucose: 122.63 mg/dL

## 2019-03-19 LAB — ECHOCARDIOGRAM COMPLETE
Height: 70 in
Weight: 2800 oz

## 2019-03-19 LAB — GLUCOSE, CAPILLARY
Glucose-Capillary: 107 mg/dL — ABNORMAL HIGH (ref 70–99)
Glucose-Capillary: 117 mg/dL — ABNORMAL HIGH (ref 70–99)
Glucose-Capillary: 83 mg/dL (ref 70–99)
Glucose-Capillary: 91 mg/dL (ref 70–99)
Glucose-Capillary: 96 mg/dL (ref 70–99)

## 2019-03-19 LAB — SARS CORONAVIRUS 2 (TAT 6-24 HRS): SARS Coronavirus 2: NEGATIVE

## 2019-03-19 NOTE — Progress Notes (Signed)
  Echocardiogram 2D Echocardiogram has been performed.  Brendan Long 03/19/2019, 11:03 AM

## 2019-03-19 NOTE — Progress Notes (Signed)
[facility] PROGRESS NOTE  Brendan Long UUV:[id number] DOB: [birth year] DOA: 03/18/2019 PCP: Isaac Bliss, Rayford Halsted, MD  Assessment/Plan: 85 year old pleasant male with history of atrial fibrillation s/p ablation 15 years back, not on any anticoagulation, hypertension, prediabetes, COPD, elevated PSA, CKD stage III, hypothyroidism presented to the emergency department with the sudden onset of swimmy headed, blurred vision.  Patient went to drink a glass of water, had an episode of vomiting.  As patient was having significant headache, came to the emergency department.  CT head without contrast did not show any acute abnormality.  Patient had recent cataract surgery to the left eye about 3 weeks ago at the [place].  MRI of the brain showed no acute intracranial abnormality.  Showed chronic right PCA territory infarct, chronic lacunar infarcts in the right pons, left corona radiate and also in the cerebellum.  Neck MRA is negative for cervical carotid stenosis but suggests high-grade stenosis at the right subclavian artery origin, moderate stenosis of the distal left vertebral artery..  Patient continued to have severe headache.  Patient is evaluated by neurology, as MRI showed no acute stroke, signed off.   ##dizziness -MRI negative for acute stroke -Showed chronic strokes in multiple locations -Patient denies any sinus congestion, however patient's symptoms improved with the physical therapy. -Keep the patient on prednisone treating as vestibular neuritis -Continue with the PT OT  ##Headache -Could be from accelerated hypertension -Get CRP, sed rate considering patient's age ruling out for temporal arteritis -Continue with acetaminophen as needed  ##Hypertension accelerated -Continue with home medications -Currently well controlled  ##Paroxysmal atrial fibrillation -Obtain echocardiogram -Continue to monitor on telemetry  ##diabetes mellitus -A1c 6.2 -Continue with the sliding  scale insulin  ##Acute on chronic renal insufficiency stage III -Patient's baseline creatinine is 1.5, trended up to 1.83 -Continue with gentle hydration and follow-up    Code Status: Full  family Communication: Friend at bedside Disposition Plan: Based on physical therapy evaluation  Consultants: Neurology HPI/Subjective: Complaining of severe headache in the bitemporal area, mild dizziness  Objective: Vitals:   03/19/19 0548 03/19/19 1419  BP: (!) 143/87 (!) 163/93  Pulse: 68 67  Resp: 18 17  Temp: 98.1 F (36.7 C) 98.2 F (36.8 C)  SpO2: 95% 95%   No intake or output data in the 24 hours ending 03/19/19 1602 Filed Weights   03/18/19 1445  Weight: 79.4 kg    Exam:  GENERAL: No acute distress.  Appears well.  HEENT: MMM. PERRL.  Visual fields intact.  Limited EOM exam due to intolerance.  Hearing grossly intact. NECK: Supple.  No apparent JVD.  No carotid bruits. RESP:  No IWOB. Good air movement bilaterally. CVS:  RRR. Heart sounds normal.  ABD/GI/GU: Bowel sounds present. Soft. Non tender.  MSK/EXT:  Moves extremities. No apparent deformity or edema.  SKIN: no apparent skin lesion or wound NEURO: Awake, alert and oriented appropriately. Cranial nerves II-XII intact but limited EOM exam due to intolerance, motor 5/5 in all muscle groups of UE and LE bilaterally, normal tone, light sensation intact in all dermatomes of upper and lower ext bilaterally, no pronator drift, biceps, patellar reflex symmetric.  Finger-to-nose intact.  Patient had significant dizziness lying back after he sat up for exam. PSYCH: Calm. Normal affect.  Data Reviewed: Basic Metabolic Panel: Recent Labs  Lab 03/18/19 1528  NA 139  K 3.8  CL 105  CO2 22  GLUCOSE 126*  BUN 32*  CREATININE 1.83*  CALCIUM 9.3   Liver Function  Tests: Recent Labs  Lab 03/18/19 1528  AST 19  ALT 16  ALKPHOS 82  BILITOT 1.5*  PROT 7.6  ALBUMIN 4.0   No results for input(s): LIPASE, AMYLASE in the  last 168 hours. No results for input(s): AMMONIA in the last 168 hours. CBC: Recent Labs  Lab 03/18/19 1528  WBC 10.7*  NEUTROABS 7.4  HGB 15.5  HCT 48.4  MCV 95.1  PLT 185   Cardiac Enzymes: No results for input(s): CKTOTAL, CKMB, CKMBINDEX, TROPONINI in the last 168 hours. BNP (last 3 results) No results for input(s): BNP in the last 8760 hours.  ProBNP (last 3 results) No results for input(s): PROBNP in the last 8760 hours.  CBG: Recent Labs  Lab 03/19/19 0040 03/19/19 0818 03/19/19 1227  GLUCAP 83 91 107*    Recent Results (from the past 240 hour(s))  SARS CORONAVIRUS 2 Nasal Swab Urine, Clean Catch     Status: None   Collection Time: 03/18/19  7:35 PM   Specimen: Urine, Clean Catch; Nasal Swab  Result Value Ref Range Status   SARS Coronavirus 2 NEGATIVE NEGATIVE Final    Comment: (NOTE) SARS-CoV-2 target nucleic acids are NOT DETECTED. The SARS-CoV-2 RNA is generally detectable in upper and lower respiratory specimens during the acute phase of infection. Negative results do not preclude SARS-CoV-2 infection, do not rule out co-infections with other pathogens, and should not be used as the sole basis for treatment or other patient management decisions. Negative results must be combined with clinical observations, patient history, and epidemiological information. The expected result is Negative. Fact Sheet for Patients: SugarRoll.be Fact Sheet for Healthcare Providers: https://www.woods-mathews.com/ This test is not yet approved or cleared by the [place] FDA and  has been authorized for detection and/or diagnosis of SARS-CoV-2 by FDA under an Emergency Use Authorization (EUA). This EUA will remain  in effect (meaning this test can be used) for the duration of the COVID-19 declaration under Section 56 4(b)(1) of the Act, 21 U.S.C. section 360bbb-3(b)(1), unless the authorization is terminated or revoked  sooner. Performed at [facility], [place] [address] [place], [place] [address]      Studies: Ct Head Wo Contrast  Result Date: 03/18/2019 CLINICAL DATA:  Altered level of consciousness with blurred vision and dizziness. Vomiting EXAM: CT HEAD WITHOUT CONTRAST TECHNIQUE: Contiguous axial images were obtained from the base of the skull through the vertex without intravenous contrast. COMPARISON:  August 30, 2007 FINDINGS: Brain: There is mild diffuse atrophy. There is no intracranial mass, hemorrhage, extra-axial fluid collection, or midline shift. There is evidence of a prior infarct in the right occipital lobe. There is patchy small vessel disease in the centra semiovale bilaterally. There is evidence of a prior lacunar infarct in the inferior anterior left centrum semiovale slightly superior to the left basal ganglia. No acute infarct is demonstrable on this study. Vascular: There is no appreciable hyperdense vessel. There is calcification in each carotid siphon region. Skull: Bony calvarium appears intact. Sinuses/Orbits: There is mucosal thickening in each maxillary antrum. There is mucosal thickening and opacification in multiple ethmoid air cells, more severe on the right than on the left as well as mucosal thickening in the sphenoid sinuses, more on the right than on the left. Orbits appear symmetric bilaterally except for evidence of previous cataract removal on the left. Other: Mastoid air cells are clear. IMPRESSION: 1. Prior infarct right occipital lobe. Prior small infarct in the anterior inferior left centrum semiovale. There is mild  diffuse atrophy with patchy periventricular small vessel disease. No acute infarct. No mass or hemorrhage. 2.  There are foci of arterial vascular calcification. 3.  Multiple foci of paranasal sinus disease. Electronically Signed   By: Lowella Grip III M.D.   On: 03/18/2019 15:34   Mr Jodene Nam Neck W Contrast  Result Date: 03/18/2019 CLINICAL DATA:   85 year old male with blurred vision and dizziness onset 1030 hours today. Vomiting and loss of balance. EXAM: MRI HEAD WITHOUT CONTRAST MRA NECK WITHOUT AND WITH CONTRAST TECHNIQUE: Multiplanar, multiecho pulse sequences of the brain and surrounding structures were obtained without intravenous contrast. Angiographic images of the neck were obtained using MRA technique without and with intravenous contrast. Carotid stenosis measurements (when applicable) are obtained utilizing NASCET criteria, using the distal internal carotid diameter as the denominator. CONTRAST:  8 milliliters Gadavist COMPARISON:  Head CT without contrast earlier today. FINDINGS: MRI HEAD FINDINGS Brain: No restricted diffusion or evidence of acute infarction. Chronic encephalomalacia in the right PCA territory with trace hemosiderin. Small chronic white matter infarct in the left corona radiata. Minor T2 heterogeneity in the deep gray matter nuclei. Small chronic lacunar infarct in the right pons (series 5, image 7). Possible occasional tiny chronic infarcts in the cerebellum (series 5, image 4). Superimposed mild for age cerebral white matter T2 and FLAIR hyperintensity. No other cortical encephalomalacia or chronic blood products identified. No midline shift, mass effect, evidence of mass lesion, ventriculomegaly, extra-axial collection or acute intracranial hemorrhage. Cervicomedullary junction and pituitary are within normal limits. Vascular: Major intracranial vascular flow voids are preserved. Skull and upper cervical spine: Partially visible advanced cervical spine disc and endplate degeneration. Visualized bone marrow signal is within normal limits. Sinuses/Orbits: Postoperative changes to the left globe, otherwise negative orbits. Mild paranasal sinus mucosal thickening. Other: Mastoids are clear. Visible internal auditory structures appear normal. Scalp and face soft tissues appear negative. MRA NECK FINDINGS Precontrast  time-of-flight images demonstrate antegrade flow in both cervical carotid and vertebral arteries. Evidence of a 3 vessel arch configuration. Codominant vertebral arteries. Antegrade flow continues to the skull base. Post-contrast neck MRA images reveal a 3 vessel arch configuration with proximal great vessel atherosclerosis. Tortuous right CCA and right ICA in the neck. Irregularity at the right ICA bulb compatible with atherosclerosis but no hemodynamically significant stenosis. Visible right ICA siphon is patent. No significant proximal left CCA stenosis. Tortuous left CCA. Negative cervical left ICA aside from tortuosity. Visible left ICA siphon is patent. Evidence of high-grade stenosis at the right subclavian artery origin on series 1601, image 28 might be in part related to tortuosity with a kinked appearance. The right vertebral artery origin is normal. The right vertebral is patent to the skull base without stenosis. There is mild stenosis at the vertebrobasilar junction on series 1601, image 47. No proximal left subclavian artery stenosis despite plaque. The left vertebral artery origin is almost excluded from the slab but seems to remain normal. No left vertebral artery stenosis in the neck. There is mild-to-moderate left vertebral V4 segment stenosis just proximal to the patent PICA origin on series 1601, image 39. Mild stenosis at the left vertebrobasilar junction. IMPRESSION: 1.  No acute intracranial abnormality. 2. Chronic right PCA territory infarct. Chronic lacunar infarcts in the right pons, left corona radiata and perhaps also in the cerebellum. 3. Neck MRA is negative for Cervical Carotid stenosis, but suggests high-grade stenosis at the Right Subclavian Artery origin, and up to moderate stenosis of the distal Left Vertebral Artery (V4  segment). Electronically Signed   By: Genevie Ann M.D.   On: 03/18/2019 21:41   Mr Brain Wo Contrast  Result Date: 03/18/2019 CLINICAL DATA:  85 year old male with  blurred vision and dizziness onset 1030 hours today. Vomiting and loss of balance. EXAM: MRI HEAD WITHOUT CONTRAST MRA NECK WITHOUT AND WITH CONTRAST TECHNIQUE: Multiplanar, multiecho pulse sequences of the brain and surrounding structures were obtained without intravenous contrast. Angiographic images of the neck were obtained using MRA technique without and with intravenous contrast. Carotid stenosis measurements (when applicable) are obtained utilizing NASCET criteria, using the distal internal carotid diameter as the denominator. CONTRAST:  8 milliliters Gadavist COMPARISON:  Head CT without contrast earlier today. FINDINGS: MRI HEAD FINDINGS Brain: No restricted diffusion or evidence of acute infarction. Chronic encephalomalacia in the right PCA territory with trace hemosiderin. Small chronic white matter infarct in the left corona radiata. Minor T2 heterogeneity in the deep gray matter nuclei. Small chronic lacunar infarct in the right pons (series 5, image 7). Possible occasional tiny chronic infarcts in the cerebellum (series 5, image 4). Superimposed mild for age cerebral white matter T2 and FLAIR hyperintensity. No other cortical encephalomalacia or chronic blood products identified. No midline shift, mass effect, evidence of mass lesion, ventriculomegaly, extra-axial collection or acute intracranial hemorrhage. Cervicomedullary junction and pituitary are within normal limits. Vascular: Major intracranial vascular flow voids are preserved. Skull and upper cervical spine: Partially visible advanced cervical spine disc and endplate degeneration. Visualized bone marrow signal is within normal limits. Sinuses/Orbits: Postoperative changes to the left globe, otherwise negative orbits. Mild paranasal sinus mucosal thickening. Other: Mastoids are clear. Visible internal auditory structures appear normal. Scalp and face soft tissues appear negative. MRA NECK FINDINGS Precontrast time-of-flight images demonstrate  antegrade flow in both cervical carotid and vertebral arteries. Evidence of a 3 vessel arch configuration. Codominant vertebral arteries. Antegrade flow continues to the skull base. Post-contrast neck MRA images reveal a 3 vessel arch configuration with proximal great vessel atherosclerosis. Tortuous right CCA and right ICA in the neck. Irregularity at the right ICA bulb compatible with atherosclerosis but no hemodynamically significant stenosis. Visible right ICA siphon is patent. No significant proximal left CCA stenosis. Tortuous left CCA. Negative cervical left ICA aside from tortuosity. Visible left ICA siphon is patent. Evidence of high-grade stenosis at the right subclavian artery origin on series 1601, image 28 might be in part related to tortuosity with a kinked appearance. The right vertebral artery origin is normal. The right vertebral is patent to the skull base without stenosis. There is mild stenosis at the vertebrobasilar junction on series 1601, image 47. No proximal left subclavian artery stenosis despite plaque. The left vertebral artery origin is almost excluded from the slab but seems to remain normal. No left vertebral artery stenosis in the neck. There is mild-to-moderate left vertebral V4 segment stenosis just proximal to the patent PICA origin on series 1601, image 39. Mild stenosis at the left vertebrobasilar junction. IMPRESSION: 1.  No acute intracranial abnormality. 2. Chronic right PCA territory infarct. Chronic lacunar infarcts in the right pons, left corona radiata and perhaps also in the cerebellum. 3. Neck MRA is negative for Cervical Carotid stenosis, but suggests high-grade stenosis at the Right Subclavian Artery origin, and up to moderate stenosis of the distal Left Vertebral Artery (V4 segment). Electronically Signed   By: Genevie Ann M.D.   On: 03/18/2019 21:41   Vas US Carotid (at [facility] Only)  Result Date: 03/19/2019 Carotid Arterial Duplex Study  Indications:       TIA and  dizziness. Risk Factors:      Hypertension, hyperlipidemia. Comparison Study:  no prior Performing Technologist: Abram Sander RVS  Examination Guidelines: A complete evaluation includes B-mode imaging, spectral Doppler, color Doppler, and power Doppler as needed of all accessible portions of each vessel. Bilateral testing is considered an integral part of a complete examination. Limited examinations for reoccurring indications may be performed as noted.  Right Carotid Findings: +----------+--------+--------+--------+-----------+--------+           PSV cm/sEDV cm/sStenosisDescribe   Comments +----------+--------+--------+--------+-----------+--------+ CCA Prox  74      14              homogeneous         +----------+--------+--------+--------+-----------+--------+ CCA Distal64      15              homogeneous         +----------+--------+--------+--------+-----------+--------+ ICA Prox  47      14      1-39%   homogeneous         +----------+--------+--------+--------+-----------+--------+ ICA Distal48      14                                  +----------+--------+--------+--------+-----------+--------+ ECA       140     17                                  +----------+--------+--------+--------+-----------+--------+ +----------+--------+-------+--------+-------------------+           PSV cm/sEDV cmsDescribeArm Pressure (mmHG) +----------+--------+-------+--------+-------------------+ [id number]                                         +----------+--------+-------+--------+-------------------+ +---------+--------+--+--------+-+---------+ VertebralPSV cm/s33EDV cm/s8Antegrade +---------+--------+--+--------+-+---------+  Left Carotid Findings: +----------+--------+--------+--------+-----------+--------+           PSV cm/sEDV cm/sStenosisDescribe   Comments +----------+--------+--------+--------+-----------+--------+ CCA Prox  64      16               homogeneous         +----------+--------+--------+--------+-----------+--------+ CCA Distal69      16              homogeneous         +----------+--------+--------+--------+-----------+--------+ ICA Prox  55      18      1-39%   homogeneous         +----------+--------+--------+--------+-----------+--------+ ICA Distal37      8                                   +----------+--------+--------+--------+-----------+--------+ ECA       233     18                                  +----------+--------+--------+--------+-----------+--------+ +----------+--------+--------+--------+-------------------+ SubclavianPSV cm/sEDV cm/sDescribeArm Pressure (mmHG) +----------+--------+--------+--------+-------------------+           53                                          +----------+--------+--------+--------+-------------------+ +---------+--------+--------+--------------+  [place] cm/sEDV cm/sNot identified +---------+--------+--------+--------------+    Preliminary     Scheduled Meds: .  stroke: mapping our early stages of recovery book   Does not apply Once  . aspirin  300 mg Rectal Daily   Or  . aspirin  325 mg Oral Daily  . heparin  5,000 Units Subcutaneous Q8H  . insulin aspart  0-5 Units Subcutaneous QHS  . insulin aspart  0-9 Units Subcutaneous TID WC   Continuous Infusions: . sodium chloride      Active Problems:   Dizziness    Time spent: 35 minutes   Jontae Sonier  [facility] Pager [phone] If 7PM-7AM, please contact night-coverage at [email or web address], password [facility] 03/19/2019, 4:02 PM  LOS: 0 days

## 2019-03-19 NOTE — Consult Note (Addendum)
Referring Physician: Dr. Lunette Stands    Chief Complaint: Vertigo and gait unsteadiness  HPI: Brendan Long is an 85 y.o. male with history of HTN, arrhythmia s/p ablation procedure 15 years ago and HLD who presented to the ED on Tuesday afternoon with acute onset of vertigo with gait unsteadiness.   First symptom occurred at about 10:30 AM, consisting of vomiting. He experienced diaphoresis at that time. He felt the vomiting may have been due to dehydration, so he drank some water but threw it back up again. Blurred vision also occurred in addition to a throbbing headache. A friend helped him walk back to his truck, at which point gait unsteadiness was noted - "I couldn't walk a straight line". His nausea continued, with several additional episodes of vomiting. His friend drove him to the ER. LVO screen there was negative. He abnormal finger to nose exam on initial assessment by EDP. He was outside of the tPA time window, per EDP note.   Past Medical History:  Diagnosis Date  . ED (erectile dysfunction)   . Hyperlipidemia   . Hypertension   . Hypothyroidism   . Psoriasis   . Varicose veins   . Vitamin D deficiency     Past Surgical History:  Procedure Laterality Date  . JOINT REPLACEMENT    . joint replacement rt and left    . rotator cuff repair, right shoulder per Dr. Tonita Cong  1996  . torn right bicepts muscle  1996  . TOTAL HIP ARTHROPLASTY  01/17/10   redone on right hip on 09/05/10    Family History  Problem Relation Age of Onset  . Breast cancer Other    Social History:  reports that he has quit smoking. He has never used smokeless tobacco. He reports that he does not drink alcohol or use drugs.  Allergies: No Known Allergies  Medications: No current facility-administered medications on file prior to encounter.    Current Outpatient Medications on File Prior to Encounter  Medication Sig Dispense Refill  . aspirin 325 MG tablet Take 325 mg by mouth 2 (two) times daily.  Take 0.5 tablet once a day    . atorvastatin (LIPITOR) 80 MG tablet TAKE 1 TABLET DAILY 90 tablet 2  . budesonide-formoterol (SYMBICORT) 80-4.5 MCG/ACT inhaler Inhale 2 puffs into the lungs 2 (two) times daily. 1 Inhaler 3  . clobetasol ointment (TEMOVATE) 0.05 % APPLY TOPICALLY TWICE A DAY 30 g 23  . diclofenac sodium (VOLTAREN) 1 % GEL APPLY 4 GRAMS TOPICALLY FOUR TIMES A DAY AS NEEDED FOR KNEE PAIN 200 g 28  . hydrochlorothiazide (HYDRODIURIL) 12.5 MG tablet TAKE 1 TABLET DAILY 90 tablet 2  . metoprolol tartrate (LOPRESSOR) 25 MG tablet Take 12.5 mg by mouth 2 (two) times daily.    . TRICOR 48 MG tablet TAKE 1 TABLET DAILY 90 tablet 4  . omega-3 acid ethyl esters (LOVAZA) 1 G capsule Take 2 capsules (2 g total) by mouth 2 (two) times daily. 120 capsule 11  . sildenafil (VIAGRA) 100 MG tablet TAKE 1 TABLET AS NEEDED ONE HOUR BEFORE NEEDED FOR ERECTILE DYSFUNCTION (Patient not taking: Reported on 03/18/2019) 10 tablet 7     ROS: No longer with blurred vision. No limb weakness, limb numbness, speech difficulty, paresthesia. No fever, CP, SOB. Has no symptoms of UTI. No palpitations. Other ROS as per HPI with all other systems negative.   Physical Examination: Blood pressure (!) 143/87, pulse 68, temperature 98.1 F (36.7 C), resp. rate 18, height 5'  10" (1.778 m), weight 79.4 kg, SpO2 95 %.  HEENT: [place]/AT Lungs: Respirations unlabored Ext: Warm and well perfused  Neurologic Examination: Mental Status: Alert, oriented, thought content appropriate.  Speech fluent without evidence of aphasia.  Able to follow all commands without difficulty. Cranial Nerves: II:  Visual fields intact with no extinction to DSS. PERRL.  III,IV, VI: No ptosis. EOM are full with persistent nystagmus present on far leftward gaze.   V,VII: Smile symmetric, facial temp sensation equa bilaterally VIII: hearing intact to conversation IX,X: No hoarseness XI: Symmetric shoulder shrug XII: midline tongue extension   Motor: Right : Upper extremity   5/5    Left:     Upper extremity   5/5  Lower extremity   5/5     Lower extremity   5/5 No pronator drift Sensory: Decreased temp sensation to LUE and LLE Deep Tendon Reflexes:  Hypoactive throughout Cerebellar: No ataxia with FNF or RAM. No ataxia with H-S bilaterally.  Gait: Deferred  Results for orders placed or performed during the hospital encounter of 03/18/19 (from the past 48 hour(s))  Ethanol     Status: None   Collection Time: 03/18/19  3:28 PM  Result Value Ref Range   Alcohol, Ethyl (B) <10 <10 mg/dL    Comment: (NOTE) Lowest detectable limit for serum alcohol is 10 mg/dL. For medical purposes only. Performed at [facility], [place] [address], [place], [place] [address]   Protime-INR     Status: None   Collection Time: 03/18/19  3:28 PM  Result Value Ref Range   Prothrombin Time 12.1 11.4 - 15.2 seconds   INR 0.9 0.8 - 1.2    Comment: (NOTE) INR goal varies based on device and disease states. Performed at [facility], [place] [address] [place], [place] [address]   APTT     Status: None   Collection Time: 03/18/19  3:28 PM  Result Value Ref Range   aPTT 29 24 - 36 seconds    Comment: Performed at [facility], [place] [address], [place], [place] [address]  CBC     Status: Abnormal   Collection Time: 03/18/19  3:28 PM  Result Value Ref Range   WBC 10.7 (H) 4.0 - 10.5 K/uL   RBC 5.09 4.22 - 5.81 MIL/uL   Hemoglobin 15.5 13.0 - 17.0 g/dL   HCT 48.4 39.0 - 52.0 %   MCV 95.1 80.0 - 100.0 fL   MCH 30.5 26.0 - 34.0 pg   MCHC 32.0 30.0 - 36.0 g/dL   RDW 13.3 11.5 - 15.5 %   Platelets 185 150 - 400 K/uL   nRBC 0.0 0.0 - 0.2 %    Comment: Performed at [facility], [place] [address] [place], [place] [address]  Differential     Status: None   Collection Time: 03/18/19  3:28 PM  Result Value Ref Range   Neutrophils Relative % 70 %   Neutro Abs 7.4 1.7 - 7.7 K/uL    Lymphocytes Relative 21 %   Lymphs Abs 2.2 0.7 - 4.0 K/uL   Monocytes Relative 5 %   Monocytes Absolute 0.6 0.1 - 1.0 K/uL   Eosinophils Relative 4 %   Eosinophils Absolute 0.4 0.0 - 0.5 K/uL   Basophils Relative 0 %   Basophils Absolute 0.0 0.0 - 0.1 K/uL   Immature Granulocytes 0 %   Abs Immature Granulocytes 0.04 0.00 - 0.07 K/uL    Comment: Performed at [facility]  [place] [address], [place], [place] [address]  Comprehensive metabolic panel     Status: Abnormal   Collection Time: 03/18/19  3:28 PM  Result Value Ref Range   Sodium 139 135 - 145 mmol/L   Potassium 3.8 3.5 - 5.1 mmol/L   Chloride 105 98 - 111 mmol/L   CO2 22 22 - 32 mmol/L   Glucose, Bld 126 (H) 70 - 99 mg/dL   BUN 32 (H) 8 - 23 mg/dL   Creatinine, Ser 1.83 (H) 0.61 - 1.24 mg/dL   Calcium 9.3 8.9 - 10.3 mg/dL   Total Protein 7.6 6.5 - 8.1 g/dL   Albumin 4.0 3.5 - 5.0 g/dL   AST 19 15 - 41 U/L   ALT 16 0 - 44 U/L   Alkaline Phosphatase 82 38 - 126 U/L   Total Bilirubin 1.5 (H) 0.3 - 1.2 mg/dL   GFR calc non Af Amer 32 (L) >60 mL/min   GFR calc Af Amer 37 (L) >60 mL/min   Anion gap 12 5 - 15    Comment: Performed at [facility], [place] [address] [place], [place] [address]  Urine rapid drug screen (hosp performed)not at [facility]     Status: None   Collection Time: 03/18/19  7:35 PM  Result Value Ref Range   Opiates NONE DETECTED NONE DETECTED   Cocaine NONE DETECTED NONE DETECTED   Benzodiazepines NONE DETECTED NONE DETECTED   Amphetamines NONE DETECTED NONE DETECTED   Tetrahydrocannabinol NONE DETECTED NONE DETECTED   Barbiturates NONE DETECTED NONE DETECTED    Comment: (NOTE) DRUG SCREEN FOR MEDICAL PURPOSES ONLY.  IF CONFIRMATION IS NEEDED FOR ANY PURPOSE, NOTIFY LAB WITHIN 5 DAYS. LOWEST DETECTABLE LIMITS FOR URINE DRUG SCREEN Drug Class                     Cutoff (ng/mL) Amphetamine and metabolites    1000 Barbiturate and metabolites    200 Benzodiazepine                  967 Tricyclics and metabolites     300 Opiates and metabolites        300 Cocaine and metabolites        300 THC                            50 Performed at [facility], [place] [address], [place], [place] [address]   Urinalysis, Routine w reflex microscopic (not at [facility])     Status: Abnormal   Collection Time: 03/18/19  7:35 PM  Result Value Ref Range   Color, Urine YELLOW YELLOW   APPearance CLEAR CLEAR   Specific Gravity, Urine 1.014 1.005 - 1.030   pH 7.0 5.0 - 8.0   Glucose, UA NEGATIVE NEGATIVE mg/dL   Hgb urine dipstick SMALL (A) NEGATIVE   Bilirubin Urine NEGATIVE NEGATIVE   Ketones, ur NEGATIVE NEGATIVE mg/dL   Protein, ur 100 (A) NEGATIVE mg/dL   Nitrite NEGATIVE NEGATIVE   Leukocytes,Ua NEGATIVE NEGATIVE   RBC / HPF 21-50 0 - 5 RBC/hpf   WBC, UA 0-5 0 - 5 WBC/hpf   Bacteria, UA NONE SEEN NONE SEEN   Mucus PRESENT    Hyaline Casts, UA PRESENT     Comment: Performed at [facility], [place] [address], [place], [place] [address]  SARS CORONAVIRUS 2 Nasal Swab Urine, Clean Catch  Status: None   Collection Time: 03/18/19  7:35 PM   Specimen: Urine, Clean Catch; Nasal Swab  Result Value Ref Range   SARS Coronavirus 2 NEGATIVE NEGATIVE    Comment: (NOTE) SARS-CoV-2 target nucleic acids are NOT DETECTED. The SARS-CoV-2 RNA is generally detectable in upper and lower respiratory specimens during the acute phase of infection. Negative results do not preclude SARS-CoV-2 infection, do not rule out co-infections with other pathogens, and should not be used as the sole basis for treatment or other patient management decisions. Negative results must be combined with clinical observations, patient history, and epidemiological information. The expected result is Negative. Fact Sheet for Patients: SugarRoll.be Fact Sheet for Healthcare Providers: https://www.woods-mathews.com/ This test is not  yet approved or cleared by the [place] FDA and  has been authorized for detection and/or diagnosis of SARS-CoV-2 by FDA under an Emergency Use Authorization (EUA). This EUA will remain  in effect (meaning this test can be used) for the duration of the COVID-19 declaration under Section 56 4(b)(1) of the Act, 21 U.S.C. section 360bbb-3(b)(1), unless the authorization is terminated or revoked sooner. Performed at [facility], [place] [address], [place], [place] [address]   Glucose, capillary     Status: None   Collection Time: 03/19/19 12:40 AM  Result Value Ref Range   Glucose-Capillary 83 70 - 99 mg/dL  Hemoglobin A1c     Status: Abnormal   Collection Time: 03/19/19  2:25 AM  Result Value Ref Range   Hgb A1c MFr Bld 5.9 (H) 4.8 - 5.6 %    Comment: (NOTE) Pre diabetes:          5.7%-6.4% Diabetes:              >6.4% Glycemic control for   <7.0% adults with diabetes    Mean Plasma Glucose 122.63 mg/dL    Comment: Performed at [place] [address], [place], [place] [address]  Lipid panel     Status: Abnormal   Collection Time: 03/19/19  2:25 AM  Result Value Ref Range   Cholesterol 169 0 - 200 mg/dL   Triglycerides 200 (H) <150 mg/dL   HDL 28 (L) >40 mg/dL   Total CHOL/HDL Ratio 6.0 RATIO   VLDL 40 0 - 40 mg/dL   LDL Cholesterol 101 (H) 0 - 99 mg/dL    Comment:        Total Cholesterol/HDL:CHD Risk Coronary Heart Disease Risk Table                     Men   Women  1/2 Average Risk   3.4   3.3  Average Risk       5.0   4.4  2 X Average Risk   9.6   7.1  3 X Average Risk  23.4   11.0        Use the calculated Patient Ratio above and the CHD Risk Table to determine the patient's CHD Risk.        ATP III CLASSIFICATION (LDL):  <100     mg/dL   Optimal  100-129  mg/dL   Near or Above                    Optimal  130-159  mg/dL   Borderline  160-189  mg/dL   High  >190     mg/dL   Very High Performed at [place] [address]  [place], [place] [address]    Ct Head Long Contrast  Result Date: 03/18/2019 CLINICAL DATA:  Altered level of consciousness with blurred vision and dizziness. Vomiting EXAM: CT HEAD WITHOUT CONTRAST TECHNIQUE: Contiguous axial images were obtained from the base of the skull through the vertex without intravenous contrast. COMPARISON:  August 30, 2007 FINDINGS: Brain: There is mild diffuse atrophy. There is no intracranial mass, hemorrhage, extra-axial fluid collection, or midline shift. There is evidence of a prior infarct in the right occipital lobe. There is patchy small vessel disease in the centra semiovale bilaterally. There is evidence of a prior lacunar infarct in the inferior anterior left centrum semiovale slightly superior to the left basal ganglia. No acute infarct is demonstrable on this study. Vascular: There is no appreciable hyperdense vessel. There is calcification in each carotid siphon region. Skull: Bony calvarium appears intact. Sinuses/Orbits: There is mucosal thickening in each maxillary antrum. There is mucosal thickening and opacification in multiple ethmoid air cells, more severe on the right than on the left as well as mucosal thickening in the sphenoid sinuses, more on the right than on the left. Orbits appear symmetric bilaterally except for evidence of previous cataract removal on the left. Other: Mastoid air cells are clear. IMPRESSION: 1. Prior infarct right occipital lobe. Prior small infarct in the anterior inferior left centrum semiovale. There is mild diffuse atrophy with patchy periventricular small vessel disease. No acute infarct. No mass or hemorrhage. 2.  There are foci of arterial vascular calcification. 3.  Multiple foci of paranasal sinus disease. Electronically Signed   By: Lowella Grip III M.D.   On: 03/18/2019 15:34   Brendan Long Neck W Contrast  Result Date: 03/18/2019 CLINICAL DATA:  85 year old male with blurred vision and dizziness onset 1030 hours today. Vomiting  and loss of balance. EXAM: MRI HEAD WITHOUT CONTRAST MRA NECK WITHOUT AND WITH CONTRAST TECHNIQUE: Multiplanar, multiecho pulse sequences of the brain and surrounding structures were obtained without intravenous contrast. Angiographic images of the neck were obtained using MRA technique without and with intravenous contrast. Carotid stenosis measurements (when applicable) are obtained utilizing NASCET criteria, using the distal internal carotid diameter as the denominator. CONTRAST:  8 milliliters Gadavist COMPARISON:  Head CT without contrast earlier today. FINDINGS: MRI HEAD FINDINGS Brain: No restricted diffusion or evidence of acute infarction. Chronic encephalomalacia in the right PCA territory with trace hemosiderin. Small chronic white matter infarct in the left corona radiata. Minor T2 heterogeneity in the deep gray matter nuclei. Small chronic lacunar infarct in the right pons (series 5, image 7). Possible occasional tiny chronic infarcts in the cerebellum (series 5, image 4). Superimposed mild for age cerebral white matter T2 and FLAIR hyperintensity. No other cortical encephalomalacia or chronic blood products identified. No midline shift, mass effect, evidence of mass lesion, ventriculomegaly, extra-axial collection or acute intracranial hemorrhage. Cervicomedullary junction and pituitary are within normal limits. Vascular: Major intracranial vascular flow voids are preserved. Skull and upper cervical spine: Partially visible advanced cervical spine disc and endplate degeneration. Visualized bone marrow signal is within normal limits. Sinuses/Orbits: Postoperative changes to the left globe, otherwise negative orbits. Mild paranasal sinus mucosal thickening. Other: Mastoids are clear. Visible internal auditory structures appear normal. Scalp and face soft tissues appear negative. MRA NECK FINDINGS Precontrast time-of-flight images demonstrate antegrade flow in both cervical carotid and vertebral arteries.  Evidence of a 3 vessel arch configuration. Codominant vertebral arteries. Antegrade flow continues to the skull base. Post-contrast neck MRA images reveal a 3 vessel arch  configuration with proximal great vessel atherosclerosis. Tortuous right CCA and right ICA in the neck. Irregularity at the right ICA bulb compatible with atherosclerosis but no hemodynamically significant stenosis. Visible right ICA siphon is patent. No significant proximal left CCA stenosis. Tortuous left CCA. Negative cervical left ICA aside from tortuosity. Visible left ICA siphon is patent. Evidence of high-grade stenosis at the right subclavian artery origin on series 1601, image 28 might be in part related to tortuosity with a kinked appearance. The right vertebral artery origin is normal. The right vertebral is patent to the skull base without stenosis. There is mild stenosis at the vertebrobasilar junction on series 1601, image 47. No proximal left subclavian artery stenosis despite plaque. The left vertebral artery origin is almost excluded from the slab but seems to remain normal. No left vertebral artery stenosis in the neck. There is mild-to-moderate left vertebral V4 segment stenosis just proximal to the patent PICA origin on series 1601, image 39. Mild stenosis at the left vertebrobasilar junction. IMPRESSION: 1.  No acute intracranial abnormality. 2. Chronic right PCA territory infarct. Chronic lacunar infarcts in the right pons, left corona radiata and perhaps also in the cerebellum. 3. Neck MRA is negative for Cervical Carotid stenosis, but suggests high-grade stenosis at the Right Subclavian Artery origin, and up to moderate stenosis of the distal Left Vertebral Artery (V4 segment). Electronically Signed   By: Genevie Ann M.D.   On: 03/18/2019 21:41   Brendan Long Contrast  Result Date: 03/18/2019 CLINICAL DATA:  85 year old male with blurred vision and dizziness onset 1030 hours today. Vomiting and loss of balance. EXAM: MRI HEAD  WITHOUT CONTRAST MRA NECK WITHOUT AND WITH CONTRAST TECHNIQUE: Multiplanar, multiecho pulse sequences of the brain and surrounding structures were obtained without intravenous contrast. Angiographic images of the neck were obtained using MRA technique without and with intravenous contrast. Carotid stenosis measurements (when applicable) are obtained utilizing NASCET criteria, using the distal internal carotid diameter as the denominator. CONTRAST:  8 milliliters Gadavist COMPARISON:  Head CT without contrast earlier today. FINDINGS: MRI HEAD FINDINGS Brain: No restricted diffusion or evidence of acute infarction. Chronic encephalomalacia in the right PCA territory with trace hemosiderin. Small chronic white matter infarct in the left corona radiata. Minor T2 heterogeneity in the deep gray matter nuclei. Small chronic lacunar infarct in the right pons (series 5, image 7). Possible occasional tiny chronic infarcts in the cerebellum (series 5, image 4). Superimposed mild for age cerebral white matter T2 and FLAIR hyperintensity. No other cortical encephalomalacia or chronic blood products identified. No midline shift, mass effect, evidence of mass lesion, ventriculomegaly, extra-axial collection or acute intracranial hemorrhage. Cervicomedullary junction and pituitary are within normal limits. Vascular: Major intracranial vascular flow voids are preserved. Skull and upper cervical spine: Partially visible advanced cervical spine disc and endplate degeneration. Visualized bone marrow signal is within normal limits. Sinuses/Orbits: Postoperative changes to the left globe, otherwise negative orbits. Mild paranasal sinus mucosal thickening. Other: Mastoids are clear. Visible internal auditory structures appear normal. Scalp and face soft tissues appear negative. MRA NECK FINDINGS Precontrast time-of-flight images demonstrate antegrade flow in both cervical carotid and vertebral arteries. Evidence of a 3 vessel arch  configuration. Codominant vertebral arteries. Antegrade flow continues to the skull base. Post-contrast neck MRA images reveal a 3 vessel arch configuration with proximal great vessel atherosclerosis. Tortuous right CCA and right ICA in the neck. Irregularity at the right ICA bulb compatible with atherosclerosis but no hemodynamically significant stenosis. Visible right ICA siphon  is patent. No significant proximal left CCA stenosis. Tortuous left CCA. Negative cervical left ICA aside from tortuosity. Visible left ICA siphon is patent. Evidence of high-grade stenosis at the right subclavian artery origin on series 1601, image 28 might be in part related to tortuosity with a kinked appearance. The right vertebral artery origin is normal. The right vertebral is patent to the skull base without stenosis. There is mild stenosis at the vertebrobasilar junction on series 1601, image 47. No proximal left subclavian artery stenosis despite plaque. The left vertebral artery origin is almost excluded from the slab but seems to remain normal. No left vertebral artery stenosis in the neck. There is mild-to-moderate left vertebral V4 segment stenosis just proximal to the patent PICA origin on series 1601, image 39. Mild stenosis at the left vertebrobasilar junction. IMPRESSION: 1.  No acute intracranial abnormality. 2. Chronic right PCA territory infarct. Chronic lacunar infarcts in the right pons, left corona radiata and perhaps also in the cerebellum. 3. Neck MRA is negative for Cervical Carotid stenosis, but suggests high-grade stenosis at the Right Subclavian Artery origin, and up to moderate stenosis of the distal Left Vertebral Artery (V4 segment). Electronically Signed   By: Genevie Ann M.D.   On: 03/18/2019 21:41    Assessment: 85 y.o. male presenting with new onset vertigo, nystagmus and gait unsteadiness 1. Exam reveals nystagmus on far leftward gaze. No ataxia or motor weakness noted.  2. MRI brain reveals no acute  stroke. A chronic right PCA territory infarct is noted, in addition to chronic lacunar infarcts in the right pons, left corona radiata and perhaps also in the cerebellum.  3. Neck MRA is negative for Cervical Carotid stenosis, but suggests high-grade stenosis at the Right Subclavian Artery origin, and up to moderate stenosis of the distal Left Vertebral Artery (V4 segment). Stroke Risk Factors - Prior stroke, HTN and HLD 4. Overall presentation, in the context of negative MRI, is most consistent with a left-sided peripheral vestibulopathy.   Recommendations: 1. ENT consult to assess for probable BPPV localized to the left-sided semicircular canals. Also possible but less likely would be Meniere's disease.  2. PT and OT consults 3. Neurology will sign off. Please call if there are additional questions.    @Electronically  signed: Dr. Kerney Elbe 03/19/2019, 6:46 AM

## 2019-03-19 NOTE — Evaluation (Signed)
Physical Therapy Evaluation Patient Details Name: Brendan Long MRN: [id number] DOB: 10-01-1928 Today's Date: 03/19/2019   History of Present Illness   [name] is an 85 y.o. male with history of HTN, arrhythmia s/p ablation procedure 15 years ago and HLD who presented to the ED on Tuesday afternoon with acute onset of vertigo with gait unsteadiness.   Clinical Impression  Pt admitted with above diagnosis. Pt currently with functional limitations due to the deficits listed below (see PT Problem List). Pt treated for right BPPV with dizziness from 8/10 iniitally down to 3/10 at end of treatment.  STill unsteady but need to give crystals time to settle.  Pt needs another PT treatment and prior to d/c as he lives alone.  Has a friend that may be able to come assist some.  Will follow acutely.   Pt will benefit from skilled PT to increase their independence and safety with mobility to allow discharge to the venue listed below.      Follow Up Recommendations Home health PT;Supervision/Assistance - 24 hour(vestibular rehab)    Equipment Recommendations  Rolling walker with 5" wheels    Recommendations for Other Services       Precautions / Restrictions Precautions Precautions: Fall Restrictions Weight Bearing Restrictions: No      Mobility  Bed Mobility Overal bed mobility: Needs Assistance Bed Mobility: Rolling;Sidelying to Sit;Sit to Supine Rolling: Supervision Sidelying to sit: Min assist       General bed mobility comments: Needed a little assist to come to sitting due to dizziness after BPPV treatment.   Transfers Overall transfer level: Needs assistance Equipment used: 2 person hand held assist Transfers: Sit to/from Stand Sit to Stand: Min assist         General transfer comment: Pt needed a little assist due to dizziness  Ambulation/Gait Ambulation/Gait assistance: Min assist;Min guard Gait Distance (Feet): 4 Feet Assistive device: 1 person hand  held assist Gait Pattern/deviations: Step-to pattern;Decreased step length - right;Decreased step length - left;Decreased stride length;Shuffle;Staggering left;Staggering right   Gait velocity interpretation: <1.31 ft/sec, indicative of household ambulator General Gait Details: Pt walked 4 steps forward and back with report of dizziness and unsteady on feet.  This was after the BPPV treatment and hasnot had time to settle.    Stairs            Wheelchair Mobility    Modified Rankin (Stroke Patients Only)       Balance Overall balance assessment: Needs assistance Sitting-balance support: Bilateral upper extremity supported;Feet supported Sitting balance-Leahy Scale: Poor Sitting balance - Comments: relied on UE support initially but progressed to no UE support once sitting a few seconds Postural control: Posterior lean Standing balance support: Bilateral upper extremity supported;During functional activity Standing balance-Leahy Scale: Poor Standing balance comment: relies on UE support                             Pertinent Vitals/Pain Pain Assessment: No/denies pain    Home Living Family/patient expects to be discharged to:: Private residence Living Arrangements: Alone Available Help at Discharge: Friend(s);Available PRN/intermittently(lady friend and neighbors) Type of Home: House Home Access: Stairs to enter   [employer] of Steps: 1 Home Layout: One level Home Equipment: Walker - 2 wheels;Cane - single point;Toilet riser Additional Comments: Pt drives and cooks for himself    Prior Function Level of Independence: Independent  Hand Dominance        Extremity/Trunk Assessment   Upper Extremity Assessment Upper Extremity Assessment: Defer to OT evaluation    Lower Extremity Assessment Lower Extremity Assessment: Generalized weakness    Cervical / Trunk Assessment Cervical / Trunk Assessment: Normal   Communication   Communication: No difficulties  Cognition Arousal/Alertness: Awake/alert Behavior During Therapy: WFL for tasks assessed/performed Overall Cognitive Status: Within Functional Limits for tasks assessed                                        General Comments General comments (skin integrity, edema, etc.): Pt tested positive for right BPPV and treated with canalith repositioning manuever for right posterior canal BPPV,  with pt reporting symptoms 8/10 initially and 3/10 at end of treatment.     Exercises     Assessment/Plan    PT Assessment Patient needs continued PT services  PT Problem List Decreased activity tolerance;Decreased balance;Decreased mobility;Decreased knowledge of use of DME;Decreased safety awareness;Decreased knowledge of precautions       PT Treatment Interventions DME instruction;Gait training;Functional mobility training;Therapeutic activities;Therapeutic exercise;Balance training;Patient/family education    PT Goals (Current goals can be found in the Care Plan section)  Acute Rehab PT Goals Patient Stated Goal: feel better, return home PT Goal Formulation: With patient Time For Goal Achievement: 04/02/19 Potential to Achieve Goals: Good    Frequency Min 3X/week   Barriers to discharge Decreased caregiver support      Co-evaluation               AM-PAC PT "6 Clicks" Mobility  Outcome Measure Help needed turning from your back to your side while in a flat bed without using bedrails?: None Help needed moving from lying on your back to sitting on the side of a flat bed without using bedrails?: A Little Help needed moving to and from a bed to a chair (including a wheelchair)?: A Little Help needed standing up from a chair using your arms (e.g., wheelchair or bedside chair)?: A Little Help needed to walk in hospital room?: A Little Help needed climbing 3-5 steps with a railing? : A Little 6 Click Score: 19    End of  Session Equipment Utilized During Treatment: Gait belt Activity Tolerance: Patient limited by fatigue(limited by dizziness) Patient left: in bed;with call bell/phone within reach;with bed alarm set Nurse Communication: Mobility status PT Visit Diagnosis: Unsteadiness on feet (R26.81);Muscle weakness (generalized) (M62.81);BPPV;Dizziness and giddiness (R42) BPPV - Right/Left : Right    Time: 8891-6945 PT Time Calculation (min) (ACUTE ONLY): 26 min   Charges:   PT Evaluation $PT Eval Moderate Complexity: 1 Mod PT Treatments $Therapeutic Activity: 8-22 mins        Gianluca Chhim,PT Acute Rehabilitation Services Pager:  [phone]  Office:  [place] 03/19/2019, 11:43 AM

## 2019-03-19 NOTE — Evaluation (Signed)
Occupational Therapy Evaluation Patient Details Name: Yogesh Cominsky MRN: [id number] DOB: [birth year] Today's Date: 03/19/2019    History of Present Illness  Adonay Scheier is an 85 y.o. male with history of HTN, arrhythmia s/p ablation procedure 15 years ago and HLD who presented to the ED on Tuesday afternoon with acute onset of vertigo with gait unsteadiness.    Clinical Impression   Pt admitted with the above diagnoses and presents with below problem list. Pt will benefit from continued acute OT to address the below listed deficits and maximize independence with basic ADLs prior to d/c home. PTA pt was independent with ADLs, drives, cooks his own meals. Pt presents with dizziness once EOB/OOB and decreased activity tolerance impacting assist level with ADLs. Pt currently min guard to min A with UB/LB ADLs and functional transfers/mobility.     Follow Up Recommendations  Home health OT;Supervision - Intermittent(may need initial 24 hour supervision)    Equipment Recommendations  None recommended by OT    Recommendations for Other Services       Precautions / Restrictions Precautions Precautions: Fall Restrictions Weight Bearing Restrictions: No      Mobility Bed Mobility Overal bed mobility: Needs Assistance Bed Mobility: Rolling;Sidelying to Sit;Sit to Supine Rolling: Supervision Sidelying to sit: Min assist   Sit to supine: Min guard   General bed mobility comments: steadying assist with onset of symptoms  Transfers Overall transfer level: Needs assistance Equipment used: 1 person hand held assist Transfers: Sit to/from Stand Sit to Stand: Min assist         General transfer comment: Pt needed a little assist due to dizziness    Balance Overall balance assessment: Needs assistance Sitting-balance support: Bilateral upper extremity supported;Feet supported Sitting balance-Leahy Scale: Poor Sitting balance - Comments: relied on UE support initially but  progressed to no UE support once sitting a few seconds Postural control: Posterior lean Standing balance support: Bilateral upper extremity supported;During functional activity Standing balance-Leahy Scale: Poor Standing balance comment: relies on UE support                           ADL either performed or assessed with clinical judgement   ADL Overall ADL's : Needs assistance/impaired Eating/Feeding: Set up;Sitting   Grooming: Wash/dry hands;Min guard;Standing   Upper Body Bathing: Sitting;Min guard   Lower Body Bathing: Minimal assistance;Sit to/from stand   Upper Body Dressing : Min guard;Sitting   Lower Body Dressing: Sit to/from stand;Minimal assistance   Toilet Transfer: Min guard;Minimal assistance;Ambulation   Toileting- Clothing Manipulation and Hygiene: Min guard   Tub/ Shower Transfer: Walk-in shower;Min guard;Ambulation   Functional mobility during ADLs: Minimal assistance;Min guard(furniture walking, assist to steadying) General ADL Comments: Pt with onset of vertigo as he was transitioning supine>EOB. Min guard to min A for safety and to steady due to onset of symptoms EOB/OOB     Vision         Perception     Praxis      Pertinent Vitals/Pain Pain Assessment: No/denies pain     Hand Dominance     Extremity/Trunk Assessment Upper Extremity Assessment Upper Extremity Assessment: Generalized weakness   Lower Extremity Assessment Lower Extremity Assessment: Generalized weakness   Cervical / Trunk Assessment Cervical / Trunk Assessment: Normal   Communication Communication Communication: No difficulties   Cognition Arousal/Alertness: Awake/alert Behavior During Therapy: WFL for tasks assessed/performed Overall Cognitive Status: Within Functional Limits for tasks assessed  General Comments  Pt tested positive for right BPPV and treated with canalith repositioning manuever for right  posterior canal BPPV,  with pt reporting symptoms 8/10 initially and 3/10 at end of treatment.     Exercises     Shoulder Instructions      Home Living Family/patient expects to be discharged to:: Private residence Living Arrangements: Alone Available Help at Discharge: Friend(s);Available PRN/intermittently(lady friend and neighbors) Type of Home: House Home Access: Stairs to enter [employer] of Steps: 1   Home Layout: One level     Bathroom Shower/Tub: Occupational psychologist: Standard Bathroom Accessibility: Yes   Home Equipment: Environmental consultant - 2 wheels;Cane - single point;Toilet riser   Additional Comments: Pt drives and cooks for himself      Prior Functioning/Environment Level of Independence: Independent                 OT Problem List: Impaired balance (sitting and/or standing);Decreased strength;Decreased activity tolerance;Decreased knowledge of use of DME or AE;Decreased knowledge of precautions      OT Treatment/Interventions: Self-care/ADL training;Therapeutic exercise;DME and/or AE instruction;Therapeutic activities;Patient/family education;Balance training    OT Goals(Current goals can be found in the care plan section) Acute Rehab OT Goals Patient Stated Goal: feel better, return home OT Goal Formulation: With patient Time For Goal Achievement: 04/02/19 Potential to Achieve Goals: Good ADL Goals Pt Will Perform Grooming: with modified independence;standing;sitting Pt Will Perform Upper Body Bathing: with set-up;sitting Pt Will Perform Lower Body Bathing: with set-up;sit to/from stand;with modified independence Pt Will Perform Upper Body Dressing: with set-up;sitting Pt Will Perform Lower Body Dressing: with set-up;with modified independence;sit to/from stand Pt Will Transfer to Toilet: with modified independence;ambulating Pt Will Perform Toileting - Clothing Manipulation and hygiene: with modified independence;sit to/from  stand Pt Will Perform Tub/Shower Transfer: with supervision;Shower transfer;with modified independence;ambulating  OT Frequency: Min 2X/week   Barriers to D/C:    from home alone       Co-evaluation              AM-PAC OT "6 Clicks" Daily Activity     Outcome Measure Help from another person eating meals?: A Little Help from another person taking care of personal grooming?: A Little Help from another person toileting, which includes using toliet, bedpan, or urinal?: A Little Help from another person bathing (including washing, rinsing, drying)?: A Little Help from another person to put on and taking off regular upper body clothing?: A Little Help from another person to put on and taking off regular lower body clothing?: A Little 6 Click Score: 18   End of Session    Activity Tolerance: Other (comment)(dizziness EOB/OOB) Patient left: in bed;with call bell/phone within reach;with bed alarm set;with family/visitor present  OT Visit Diagnosis: Unsteadiness on feet (R26.81);Muscle weakness (generalized) (M62.81);BPPV BPPV - Right/Left: Right                Time: 3329-5188 OT Time Calculation (min): 17 min Charges:  OT General Charges $OT Visit: 1 Visit OT Evaluation $OT Eval Low Complexity: [place], OT Acute Rehabilitation Services Pager: [phone] Office: [phone]   Hortencia Pilar 03/19/2019, 11:33 AM

## 2019-03-19 NOTE — Evaluation (Signed)
Speech Language Pathology Evaluation Patient Details Name: Brendan Long MRN: [id number] DOB: 12-May-1929 Today's Date: 03/19/2019 Time: 8563-1497 SLP Time Calculation (min) (ACUTE ONLY): 18 min  Problem List:  Patient Active Problem List   Diagnosis Date Noted  . Dizziness 03/18/2019  . Neck swelling 07/26/2018  . Elevated PSA 12/16/2014  . Hypokalemia 12/16/2014  . Hyperglycemia 12/15/2013  . Renal insufficiency 12/15/2013  . Dyspnea 12/15/2013  . Hand pain, right 12/15/2013  . Screening for cancer 12/05/2012  . Encounter for long-term (current) use of other medications 12/05/2011  . Wellness examination 12/05/2011  . Osteoarthritis 12/05/2011  . Cough 12/05/2011  . ERECTILE DYSFUNCTION, NON-ORGANIC 11/09/2009  . Vitamin D deficiency 08/13/2008  . VARICOSE VEINS, LOWER EXTREMITIES 08/13/2008  . UNS ADVRS EFF OTH RX MEDICINAL&BIOLOGICAL SBSTNC 08/13/2008  . Hypothyroidism 05/02/2007  . PSORIASIS 05/02/2007  . Dyslipidemia 03/12/2007  . Essential hypertension 03/12/2007   Past Medical History:  Past Medical History:  Diagnosis Date  . ED (erectile dysfunction)   . Hyperlipidemia   . Hypertension   . Hypothyroidism   . Psoriasis   . Varicose veins   . Vitamin D deficiency    Past Surgical History:  Past Surgical History:  Procedure Laterality Date  . JOINT REPLACEMENT    . joint replacement rt and left    . rotator cuff repair, right shoulder per Dr. Tonita Cong  1996  . torn right bicepts muscle  1996  . TOTAL HIP ARTHROPLASTY  01/17/10   redone on right hip on 09/05/10   HPI:  Pt is an 85 y.o. male with history of arrhythmia status post heart ablation 15 years ago, HTN, prediabetes, COPD, elevated PSA and CKD-3 and hypothyroidism who presented to ED with sudden onset of "swimmy headed" and blurry vision. MRI was negative for acute chnges but showed chronic right PCA territory infarct.    Assessment / Plan / Recommendation Clinical Impression  Pt participated in  speech/language/cognition evaluation with his friend present. Both parties denied pt having any baseline or acute deficits in speech, language or cognition. His speech and language skills are currently within normal limits and no overt cognitive-linguistic deficits were noted. Further skilled SLP services are not clinically indicated at this time. Pt, nursing, and his friend at bedside were educated regarding this and all parties verbalized understanding as well as agreement with plan of care.    SLP Assessment  SLP Recommendation/Assessment: Patient does not need any further Speech Lanaguage Pathology Services SLP Visit Diagnosis: Cognitive communication deficit (R41.841)    Follow Up Recommendations  None    Frequency and Duration           SLP Evaluation Cognition  Overall Cognitive Status: Within Functional Limits for tasks assessed Arousal/Alertness: Awake/alert Orientation Level: Oriented X4 Attention: Focused;Sustained Focused Attention: Appears intact Sustained Attention: Appears intact Memory: Impaired Memory Impairment: Storage deficit;Retrieval deficit;Decreased recall of new information(Immediate: 3/3; Delayed: 2/3 with cue: 1/1) Awareness: Appears intact Problem Solving: Appears intact(5/5)       Comprehension  Auditory Comprehension Overall Auditory Comprehension: Appears within functional limits for tasks assessed Yes/No Questions: Within Functional Limits Basic Biographical Questions: (5/5) Complex Questions: (5/5) Paragraph Comprehension (via yes/no questions): (2/4) Commands: Within Functional Limits Two Step Basic Commands: (4/4) Multistep Basic Commands: (4/4) Conversation: Complex Reading Comprehension Reading Status: Within funtional limits    Expression Expression Primary Mode of Expression: Verbal Verbal Expression Overall Verbal Expression: Appears within functional limits for tasks assessed Initiation: No impairment Automatic Speech:  Counting;Day of week;Month of year  Level of Generative/Spontaneous Verbalization: Conversation Repetition: No impairment Naming: No impairment Responsive: (5/5) Confrontation: Within functional limits(10/10) Pragmatics: No impairment   Oral / Motor  Oral Motor/Sensory Function Overall Oral Motor/Sensory Function: Within functional limits Motor Speech Overall Motor Speech: Appears within functional limits for tasks assessed Respiration: Within functional limits Resonance: Within functional limits Articulation: Impaired Level of Impairment: Conversation Intelligibility: Intelligible Motor Planning: Witnin functional limits   Delona Clasby I. Hardin Negus, [place], [place] Office number [phone] Pager [place] 03/19/2019, 1:50 PM

## 2019-03-19 NOTE — Progress Notes (Signed)
Carotid duplex has been completed.   Preliminary results in CV Proc.   Abram Sander 03/19/2019 9:30 AM

## 2019-03-20 LAB — GLUCOSE, CAPILLARY
Glucose-Capillary: 118 mg/dL — ABNORMAL HIGH (ref 70–99)
Glucose-Capillary: 85 mg/dL (ref 70–99)
Glucose-Capillary: 74 mg/dL (ref 70–99)
Glucose-Capillary: 92 mg/dL (ref 70–99)

## 2019-03-20 MED ORDER — SODIUM CHLORIDE 0.9 % IV SOLN
INTRAVENOUS | Status: DC
  Administered 2019-03-20 – 2019-03-21 (×2): via INTRAVENOUS

## 2019-03-20 MED ORDER — AMLODIPINE BESYLATE 10 MG PO TABS
10.0000 mg | ORAL_TABLET | Freq: Every day | ORAL | Status: DC
  Administered 2019-03-20 – 2019-03-21 (×2): 10 mg via ORAL
  Filled 2019-03-20 (×2): qty 1

## 2019-03-20 NOTE — TOC Initial Note (Addendum)
Transition of Care [facility]) - Initial/Assessment Note    Patient Details  Name: Brendan Long MRN: [id number] Date of Birth: Dec 14, 1928  Transition of Care [facility]) CM/SW Contact:    Benard Halsted, LCSW Phone Number: 03/20/2019, 12:01 PM  Clinical Narrative:                 CSW spoke with patient regarding recommendation of home health services. Patient reported that he is doing much better and he does not want any home health services. He states he has a walker and cane at home if he needs them and supportive friends. He will follow up with his PCP. No other needs reported.   Expected Discharge Plan: Home/Self Care Barriers to Discharge: Continued Medical Work up   Patient Goals and CMS Choice Patient states their goals for this hospitalization and ongoing recovery are:: Return home   Choice offered to / list presented to : Patient  Expected Discharge Plan and Services Expected Discharge Plan: Home/Self Care In-house Referral: NA Discharge Planning Services: CM Consult Post Acute Care Choice: NA Living arrangements for the past 2 months: Single Family Home                 DME Arranged: N/A DME Agency: NA       HH Arranged: Refused [place] Agency: NA        Prior Living Arrangements/Services Living arrangements for the past 2 months: Single Family Home Lives with:: Self Patient language and need for interpreter reviewed:: Yes Do you feel safe going back to the place where you live?: Yes      Need for Family Participation in Patient Care: No (Comment) Care giver support system in place?: Yes (comment) Current home services: DME Criminal Activity/Legal Involvement Pertinent to Current Situation/Hospitalization: No - Comment as needed  Activities of Daily Living Home Assistive Devices/Equipment: Environmental consultant (specify type), Cane (specify quad or straight) ADL Screening (condition at time of admission) Patient's cognitive ability adequate to safely complete daily activities?:  Yes Is the patient deaf or have difficulty hearing?: No Does the patient have difficulty seeing, even when wearing glasses/contacts?: No Does the patient have difficulty concentrating, remembering, or making decisions?: Yes Patient able to express need for assistance with ADLs?: Yes Does the patient have difficulty dressing or bathing?: No Independently performs ADLs?: Yes (appropriate for developmental age) Does the patient have difficulty walking or climbing stairs?: No Weakness of Legs: None Weakness of Arms/Hands: None  Permission Sought/Granted                  Emotional Assessment Appearance:: Appears younger than stated age Attitude/Demeanor/Rapport: Charismatic, Gracious Affect (typically observed): Accepting, Appropriate, Pleasant Orientation: : Oriented to Self, Oriented to Place, Oriented to  Time, Oriented to Situation Alcohol / Substance Use: Not Applicable Psych Involvement: No (comment)  Admission diagnosis:  Dizziness [R42] Nausea and vomiting, intractability of vomiting not specified, unspecified vomiting type [R11.2] Patient Active Problem List   Diagnosis Date Noted  . Dizziness 03/18/2019  . Neck swelling 07/26/2018  . Elevated PSA 12/16/2014  . Hypokalemia 12/16/2014  . Hyperglycemia 12/15/2013  . Renal insufficiency 12/15/2013  . Dyspnea 12/15/2013  . Hand pain, right 12/15/2013  . Screening for cancer 12/05/2012  . Encounter for long-term (current) use of other medications 12/05/2011  . Wellness examination 12/05/2011  . Osteoarthritis 12/05/2011  . Cough 12/05/2011  . ERECTILE DYSFUNCTION, NON-ORGANIC 11/09/2009  . Vitamin D deficiency 08/13/2008  . VARICOSE VEINS, LOWER EXTREMITIES 08/13/2008  . UNS  ADVRS EFF OTH RX MEDICINAL&BIOLOGICAL SBSTNC 08/13/2008  . Hypothyroidism 05/02/2007  . PSORIASIS 05/02/2007  . Dyslipidemia 03/12/2007  . Essential hypertension 03/12/2007   PCP:  Isaac Bliss, Rayford Halsted, MD Pharmacy:   [place], [address] [place] [address] Phone: [phone] Fax: [phone]     Social Determinants of Health (SDOH) Interventions    Readmission Risk Interventions Readmission Risk Prevention Plan 03/20/2019  Post Dischage Appt Complete  Medication Screening Complete  Transportation Screening Complete  Some recent data might be hidden

## 2019-03-20 NOTE — Progress Notes (Signed)
Physical Therapy Treatment and D/C Patient Details Name: Brendan Long MRN: [id number] DOB: 10/02/1928 Today's Date: 03/20/2019    History of Present Illness  Brendan Long is an 85 y.o. male with history of HTN, arrhythmia s/p ablation procedure 15 years ago and HLD who presented to the ED on Tuesday afternoon with acute onset of vertigo with gait unsteadiness.     PT Comments    Pt admitted with above diagnosis. Pt currently without significant functional limitations at this time.  Pt was able to ambulate in hallway without LOB and with challenges with independence.  Scored 23/24 on DGI suggesting low risk of falls.  Pt BPPV resolved after treatment yesterday and could not elicit any dizziness.   Pt does not need further skilled PT at this time and pt agrees.  Goals met. Will sign off.   Follow Up Recommendations  No PT follow up;Supervision - Intermittent(vestibular rehab)     Equipment Recommendations  None recommended by PT    Recommendations for Other Services       Precautions / Restrictions Precautions Precautions: Fall Restrictions Weight Bearing Restrictions: No    Mobility  Bed Mobility Overal bed mobility: Needs Assistance Bed Mobility: Rolling;Sidelying to Sit;Sit to Supine Rolling: Independent Sidelying to sit: Independent       General bed mobility comments: No assist needed  Transfers Overall transfer level: Independent Equipment used: None Transfers: Sit to/from Stand Sit to Stand: Independent         General transfer comment: no assist needed  Ambulation/Gait Ambulation/Gait assistance: Independent Gait Distance (Feet): 400 Feet Assistive device: None Gait Pattern/deviations: Step-through pattern   Gait velocity interpretation: 1.31 - 2.62 ft/sec, indicative of limited community ambulator General Gait Details: Pt able to ambulate in hallway without device with good stability with challenges.  Coul dnot elicit dizziness today!   Stopped by bathroom and urinated in standing and then washed hands without assist.    Stairs             Wheelchair Mobility    Modified Rankin (Stroke Patients Only)       Balance   Sitting-balance support: Feet supported;No upper extremity supported Sitting balance-Leahy Scale: Good Sitting balance - Comments: No assist today   Standing balance support: During functional activity;No upper extremity supported Standing balance-Leahy Scale: Good Standing balance comment: Balance much improved today statically and dynamically                 Standardized Balance Assessment Standardized Balance Assessment : Dynamic Gait Index   Dynamic Gait Index Level Surface: Normal Change in Gait Speed: Normal Gait with Horizontal Head Turns: Normal Gait with Vertical Head Turns: Normal Gait and Pivot Turn: Normal Step Over Obstacle: Normal Step Around Obstacles: Normal Steps: Mild Impairment Total Score: 23      Cognition Arousal/Alertness: Awake/alert Behavior During Therapy: WFL for tasks assessed/performed Overall Cognitive Status: Within Functional Limits for tasks assessed                                        Exercises      General Comments General comments (skin integrity, edema, etc.): Pt has no dizziness today and reports full resolution after treatment yesterday.  BAlance good as well.       Pertinent Vitals/Pain Pain Assessment: No/denies pain    Home Living  Prior Function            PT Goals (current goals can now be found in the care plan section) Acute Rehab PT Goals Patient Stated Goal: feel better, return home PT Goal Formulation: All assessment and education complete, DC therapy Progress towards PT goals: Goals met/education completed, patient discharged from PT    Frequency    Min 3X/week      PT Plan Discharge plan needs to be updated;Equipment recommendations need to be updated     Co-evaluation              AM-PAC PT "6 Clicks" Mobility   Outcome Measure  Help needed turning from your back to your side while in a flat bed without using bedrails?: None Help needed moving from lying on your back to sitting on the side of a flat bed without using bedrails?: None Help needed moving to and from a bed to a chair (including a wheelchair)?: None Help needed standing up from a chair using your arms (e.g., wheelchair or bedside chair)?: None Help needed to walk in hospital room?: None Help needed climbing 3-5 steps with a railing? : None 6 Click Score: 24    End of Session Equipment Utilized During Treatment: Gait belt Activity Tolerance: Patient tolerated treatment well Patient left: in bed;with call bell/phone within reach Nurse Communication: Mobility status PT Visit Diagnosis: Unsteadiness on feet (R26.81);Muscle weakness (generalized) (M62.81);BPPV;Dizziness and giddiness (R42) BPPV - Right/Left : Right     Time: 0122-2411 PT Time Calculation (min) (ACUTE ONLY): 16 min  Charges:  $Gait Training: 8-22 mins                     [place] Pager:  [phone]  Office:  [place] 03/20/2019, 9:52 AM

## 2019-03-20 NOTE — Progress Notes (Addendum)
[facility] PROGRESS NOTE  Rhea Thrun DJT:[id number] DOB: 05-22-1929 DOA: 03/18/2019 PCP: Isaac Bliss, Rayford Halsted, MD  Assessment/Plan: 85 year old pleasant male with history of atrial fibrillation s/p ablation 15 years back, not on any anticoagulation, hypertension, prediabetes, COPD, elevated PSA, CKD stage III, hypothyroidism presented to the emergency department with the sudden onset of swimmy headed, blurred vision.  Patient went to drink a glass of water, had an episode of vomiting.  As patient was having significant headache, came to the emergency department.  CT head without contrast did not show any acute abnormality.  Patient had recent cataract surgery to the left eye about 3 weeks ago at the [place].  MRI of the brain showed no acute intracranial abnormality.  Showed chronic right PCA territory infarct, chronic lacunar infarcts in the right pons, left corona radiate and also in the cerebellum.  Neck MRA is negative for cervical carotid stenosis but suggests high-grade stenosis at the right subclavian artery origin, moderate stenosis of the distal left vertebral artery..  Patient continued to have severe headache.  Patient is evaluated by neurology, as MRI showed no acute stroke, signed off.   ##dizziness -MRI negative for acute stroke -Showed chronic strokes in multiple locations -Patient denies any sinus congestion, however patient's symptoms improved with the physical therapy. -Keep the patient on prednisone treating as vestibular neuritis -Continue with the PT OT-recommended home with home health with physical therapy  ##Headache -Could be from accelerated hypertension -Get CRP, sed rate considering patient's age ruling out for temporal arteritis -Continue with acetaminophen as needed -Improved  ##Hypertension accelerated -Continue with home medications -Started on amlodipine -Continue to follow-up  ##Paroxysmal atrial fibrillation -Obtain  echocardiogram -Continue to monitor on telemetry  ##diabetes mellitus -A1c 6.2 -Continue with the sliding scale insulin  ##Acute on chronic renal insufficiency stage III -Patient's baseline creatinine is 1.5, trended up to 1.83 -Continue with gentle hydration and follow-up    Code Status: Full  family Communication: No family members are at bedside  disposition Plan: Based on physical therapy evaluation  Consultants: Neurology HPI/Subjective: Improved headache, dizziness Objective: Vitals:   03/20/19 0431 03/20/19 0859  BP: (!) 171/99 (!) 171/94  Pulse: 63 70  Resp: 18   Temp: (!) 97.5 F (36.4 C) 97.7 F (36.5 C)  SpO2: 94% 96%    Intake/Output Summary (Last 24 hours) at 03/20/2019 1318 Last data filed at 03/20/2019 0902 Gross per 24 hour  Intake 240 ml  Output -  Net 240 ml   Filed Weights   03/18/19 1445  Weight: 79.4 kg    Exam:  GENERAL: No acute distress.  Appears well.  HEENT: MMM. PERRL.  Visual fields intact.  Limited EOM exam due to intolerance.  Hearing grossly intact. NECK: Supple.  No apparent JVD.  No carotid bruits. RESP:  No IWOB. Good air movement bilaterally. CVS:  RRR. Heart sounds normal.  ABD/GI/GU: Bowel sounds present. Soft. Non tender.  MSK/EXT:  Moves extremities. No apparent deformity or edema.  SKIN: no apparent skin lesion or wound NEURO: Awake, alert and oriented appropriately. Cranial nerves II-XII intact but limited EOM exam due to intolerance, motor 5/5 in all muscle groups of UE and LE bilaterally, normal tone, light sensation intact in all dermatomes of upper and lower ext bilaterally, no pronator drift, biceps, patellar reflex symmetric.  Finger-to-nose intact.   PSYCH: Calm. Normal affect.  Data Reviewed: Basic Metabolic Panel: Recent Labs  Lab 03/18/19 1528  NA 139  K 3.8  CL 105  CO2  22  GLUCOSE 126*  BUN 32*  CREATININE 1.83*  CALCIUM 9.3   Liver Function Tests: Recent Labs  Lab 03/18/19 1528  AST 19  ALT  16  ALKPHOS 82  BILITOT 1.5*  PROT 7.6  ALBUMIN 4.0   No results for input(s): LIPASE, AMYLASE in the last 168 hours. No results for input(s): AMMONIA in the last 168 hours. CBC: Recent Labs  Lab 03/18/19 1528  WBC 10.7*  NEUTROABS 7.4  HGB 15.5  HCT 48.4  MCV 95.1  PLT 185   Cardiac Enzymes: No results for input(s): CKTOTAL, CKMB, CKMBINDEX, TROPONINI in the last 168 hours. BNP (last 3 results) No results for input(s): BNP in the last 8760 hours.  ProBNP (last 3 results) No results for input(s): PROBNP in the last 8760 hours.  CBG: Recent Labs  Lab 03/19/19 1227 03/19/19 1658 03/19/19 2051 03/20/19 0751 03/20/19 1255  GLUCAP 107* 96 117* 118* 85    Recent Results (from the past 240 hour(s))  SARS CORONAVIRUS 2 Nasal Swab Urine, Clean Catch     Status: None   Collection Time: 03/18/19  7:35 PM   Specimen: Urine, Clean Catch; Nasal Swab  Result Value Ref Range Status   SARS Coronavirus 2 NEGATIVE NEGATIVE Final    Comment: (NOTE) SARS-CoV-2 target nucleic acids are NOT DETECTED. The SARS-CoV-2 RNA is generally detectable in upper and lower respiratory specimens during the acute phase of infection. Negative results do not preclude SARS-CoV-2 infection, do not rule out co-infections with other pathogens, and should not be used as the sole basis for treatment or other patient management decisions. Negative results must be combined with clinical observations, patient history, and epidemiological information. The expected result is Negative. Fact Sheet for Patients: SugarRoll.be Fact Sheet for Healthcare Providers: https://www.woods-mathews.com/ This test is not yet approved or cleared by the [place] FDA and  has been authorized for detection and/or diagnosis of SARS-CoV-2 by FDA under an Emergency Use Authorization (EUA). This EUA will remain  in effect (meaning this test can be used) for the duration of  the COVID-19 declaration under Section 56 4(b)(1) of the Act, 21 U.S.C. section 360bbb-3(b)(1), unless the authorization is terminated or revoked sooner. Performed at [facility], [place] [address] [place], [place] [address]      Studies: Ct Head Wo Contrast  Result Date: 03/18/2019 CLINICAL DATA:  Altered level of consciousness with blurred vision and dizziness. Vomiting EXAM: CT HEAD WITHOUT CONTRAST TECHNIQUE: Contiguous axial images were obtained from the base of the skull through the vertex without intravenous contrast. COMPARISON:  August 30, 2007 FINDINGS: Brain: There is mild diffuse atrophy. There is no intracranial mass, hemorrhage, extra-axial fluid collection, or midline shift. There is evidence of a prior infarct in the right occipital lobe. There is patchy small vessel disease in the centra semiovale bilaterally. There is evidence of a prior lacunar infarct in the inferior anterior left centrum semiovale slightly superior to the left basal ganglia. No acute infarct is demonstrable on this study. Vascular: There is no appreciable hyperdense vessel. There is calcification in each carotid siphon region. Skull: Bony calvarium appears intact. Sinuses/Orbits: There is mucosal thickening in each maxillary antrum. There is mucosal thickening and opacification in multiple ethmoid air cells, more severe on the right than on the left as well as mucosal thickening in the sphenoid sinuses, more on the right than on the left. Orbits appear symmetric bilaterally except for evidence of previous cataract removal on the left. Other: Mastoid air  cells are clear. IMPRESSION: 1. Prior infarct right occipital lobe. Prior small infarct in the anterior inferior left centrum semiovale. There is mild diffuse atrophy with patchy periventricular small vessel disease. No acute infarct. No mass or hemorrhage. 2.  There are foci of arterial vascular calcification. 3.  Multiple foci of paranasal sinus disease.  Electronically Signed   By: Lowella Grip III M.D.   On: 03/18/2019 15:34   Mr Jodene Nam Neck W Contrast  Result Date: 03/18/2019 CLINICAL DATA:  85 year old male with blurred vision and dizziness onset 1030 hours today. Vomiting and loss of balance. EXAM: MRI HEAD WITHOUT CONTRAST MRA NECK WITHOUT AND WITH CONTRAST TECHNIQUE: Multiplanar, multiecho pulse sequences of the brain and surrounding structures were obtained without intravenous contrast. Angiographic images of the neck were obtained using MRA technique without and with intravenous contrast. Carotid stenosis measurements (when applicable) are obtained utilizing NASCET criteria, using the distal internal carotid diameter as the denominator. CONTRAST:  8 milliliters Gadavist COMPARISON:  Head CT without contrast earlier today. FINDINGS: MRI HEAD FINDINGS Brain: No restricted diffusion or evidence of acute infarction. Chronic encephalomalacia in the right PCA territory with trace hemosiderin. Small chronic white matter infarct in the left corona radiata. Minor T2 heterogeneity in the deep gray matter nuclei. Small chronic lacunar infarct in the right pons (series 5, image 7). Possible occasional tiny chronic infarcts in the cerebellum (series 5, image 4). Superimposed mild for age cerebral white matter T2 and FLAIR hyperintensity. No other cortical encephalomalacia or chronic blood products identified. No midline shift, mass effect, evidence of mass lesion, ventriculomegaly, extra-axial collection or acute intracranial hemorrhage. Cervicomedullary junction and pituitary are within normal limits. Vascular: Major intracranial vascular flow voids are preserved. Skull and upper cervical spine: Partially visible advanced cervical spine disc and endplate degeneration. Visualized bone marrow signal is within normal limits. Sinuses/Orbits: Postoperative changes to the left globe, otherwise negative orbits. Mild paranasal sinus mucosal thickening. Other: Mastoids are  clear. Visible internal auditory structures appear normal. Scalp and face soft tissues appear negative. MRA NECK FINDINGS Precontrast time-of-flight images demonstrate antegrade flow in both cervical carotid and vertebral arteries. Evidence of a 3 vessel arch configuration. Codominant vertebral arteries. Antegrade flow continues to the skull base. Post-contrast neck MRA images reveal a 3 vessel arch configuration with proximal great vessel atherosclerosis. Tortuous right CCA and right ICA in the neck. Irregularity at the right ICA bulb compatible with atherosclerosis but no hemodynamically significant stenosis. Visible right ICA siphon is patent. No significant proximal left CCA stenosis. Tortuous left CCA. Negative cervical left ICA aside from tortuosity. Visible left ICA siphon is patent. Evidence of high-grade stenosis at the right subclavian artery origin on series 1601, image 28 might be in part related to tortuosity with a kinked appearance. The right vertebral artery origin is normal. The right vertebral is patent to the skull base without stenosis. There is mild stenosis at the vertebrobasilar junction on series 1601, image 47. No proximal left subclavian artery stenosis despite plaque. The left vertebral artery origin is almost excluded from the slab but seems to remain normal. No left vertebral artery stenosis in the neck. There is mild-to-moderate left vertebral V4 segment stenosis just proximal to the patent PICA origin on series 1601, image 39. Mild stenosis at the left vertebrobasilar junction. IMPRESSION: 1.  No acute intracranial abnormality. 2. Chronic right PCA territory infarct. Chronic lacunar infarcts in the right pons, left corona radiata and perhaps also in the cerebellum. 3. Neck MRA is negative for Cervical Carotid  stenosis, but suggests high-grade stenosis at the Right Subclavian Artery origin, and up to moderate stenosis of the distal Left Vertebral Artery (V4 segment). Electronically  Signed   By: Genevie Ann M.D.   On: 03/18/2019 21:41   Mr Brain Wo Contrast  Result Date: 03/18/2019 CLINICAL DATA:  85 year old male with blurred vision and dizziness onset 1030 hours today. Vomiting and loss of balance. EXAM: MRI HEAD WITHOUT CONTRAST MRA NECK WITHOUT AND WITH CONTRAST TECHNIQUE: Multiplanar, multiecho pulse sequences of the brain and surrounding structures were obtained without intravenous contrast. Angiographic images of the neck were obtained using MRA technique without and with intravenous contrast. Carotid stenosis measurements (when applicable) are obtained utilizing NASCET criteria, using the distal internal carotid diameter as the denominator. CONTRAST:  8 milliliters Gadavist COMPARISON:  Head CT without contrast earlier today. FINDINGS: MRI HEAD FINDINGS Brain: No restricted diffusion or evidence of acute infarction. Chronic encephalomalacia in the right PCA territory with trace hemosiderin. Small chronic white matter infarct in the left corona radiata. Minor T2 heterogeneity in the deep gray matter nuclei. Small chronic lacunar infarct in the right pons (series 5, image 7). Possible occasional tiny chronic infarcts in the cerebellum (series 5, image 4). Superimposed mild for age cerebral white matter T2 and FLAIR hyperintensity. No other cortical encephalomalacia or chronic blood products identified. No midline shift, mass effect, evidence of mass lesion, ventriculomegaly, extra-axial collection or acute intracranial hemorrhage. Cervicomedullary junction and pituitary are within normal limits. Vascular: Major intracranial vascular flow voids are preserved. Skull and upper cervical spine: Partially visible advanced cervical spine disc and endplate degeneration. Visualized bone marrow signal is within normal limits. Sinuses/Orbits: Postoperative changes to the left globe, otherwise negative orbits. Mild paranasal sinus mucosal thickening. Other: Mastoids are clear. Visible internal  auditory structures appear normal. Scalp and face soft tissues appear negative. MRA NECK FINDINGS Precontrast time-of-flight images demonstrate antegrade flow in both cervical carotid and vertebral arteries. Evidence of a 3 vessel arch configuration. Codominant vertebral arteries. Antegrade flow continues to the skull base. Post-contrast neck MRA images reveal a 3 vessel arch configuration with proximal great vessel atherosclerosis. Tortuous right CCA and right ICA in the neck. Irregularity at the right ICA bulb compatible with atherosclerosis but no hemodynamically significant stenosis. Visible right ICA siphon is patent. No significant proximal left CCA stenosis. Tortuous left CCA. Negative cervical left ICA aside from tortuosity. Visible left ICA siphon is patent. Evidence of high-grade stenosis at the right subclavian artery origin on series 1601, image 28 might be in part related to tortuosity with a kinked appearance. The right vertebral artery origin is normal. The right vertebral is patent to the skull base without stenosis. There is mild stenosis at the vertebrobasilar junction on series 1601, image 47. No proximal left subclavian artery stenosis despite plaque. The left vertebral artery origin is almost excluded from the slab but seems to remain normal. No left vertebral artery stenosis in the neck. There is mild-to-moderate left vertebral V4 segment stenosis just proximal to the patent PICA origin on series 1601, image 39. Mild stenosis at the left vertebrobasilar junction. IMPRESSION: 1.  No acute intracranial abnormality. 2. Chronic right PCA territory infarct. Chronic lacunar infarcts in the right pons, left corona radiata and perhaps also in the cerebellum. 3. Neck MRA is negative for Cervical Carotid stenosis, but suggests high-grade stenosis at the Right Subclavian Artery origin, and up to moderate stenosis of the distal Left Vertebral Artery (V4 segment). Electronically Signed   By: Herminio Heads.D.  On: 03/18/2019 21:41   Vas US Carotid (at [place] Only)  Result Date: 03/20/2019 Carotid Arterial Duplex Study Indications:       TIA and dizziness. Risk Factors:      Hypertension, hyperlipidemia. Comparison Study:  no prior Performing Technologist: Abram Sander RVS  Examination Guidelines: A complete evaluation includes B-mode imaging, spectral Doppler, color Doppler, and power Doppler as needed of all accessible portions of each vessel. Bilateral testing is considered an integral part of a complete examination. Limited examinations for reoccurring indications may be performed as noted.  Right Carotid Findings: +----------+--------+--------+--------+-----------+--------+           PSV cm/sEDV cm/sStenosisDescribe   Comments +----------+--------+--------+--------+-----------+--------+ CCA Prox  74      14              homogeneous         +----------+--------+--------+--------+-----------+--------+ CCA Distal64      15              homogeneous         +----------+--------+--------+--------+-----------+--------+ ICA Prox  47      14      1-39%   homogeneous         +----------+--------+--------+--------+-----------+--------+ ICA Distal48      14                                  +----------+--------+--------+--------+-----------+--------+ ECA       140     17                                  +----------+--------+--------+--------+-----------+--------+ +----------+--------+-------+--------+-------------------+           PSV cm/sEDV cmsDescribeArm Pressure (mmHG) +----------+--------+-------+--------+-------------------+ [id number]                                         +----------+--------+-------+--------+-------------------+ +---------+--------+--+--------+-+---------+ VertebralPSV cm/s33EDV cm/s8Antegrade +---------+--------+--+--------+-+---------+  Left Carotid Findings: +----------+--------+--------+--------+-----------+--------+            PSV cm/sEDV cm/sStenosisDescribe   Comments +----------+--------+--------+--------+-----------+--------+ CCA Prox  64      16              homogeneous         +----------+--------+--------+--------+-----------+--------+ CCA Distal69      16              homogeneous         +----------+--------+--------+--------+-----------+--------+ ICA Prox  55      18      1-39%   homogeneous         +----------+--------+--------+--------+-----------+--------+ ICA Distal37      8                                   +----------+--------+--------+--------+-----------+--------+ ECA       233     18                                  +----------+--------+--------+--------+-----------+--------+ +----------+--------+--------+--------+-------------------+ SubclavianPSV cm/sEDV cm/sDescribeArm Pressure (mmHG) +----------+--------+--------+--------+-------------------+           53                                          +----------+--------+--------+--------+-------------------+ +---------+--------+--------+--------------+  Dixon cm/sEDV cm/sNot identified +---------+--------+--------+--------------+  Summary: Right Carotid: Velocities in the right ICA are consistent with a 1-39% stenosis. Left Carotid: Velocities in the left ICA are consistent with a 1-39% stenosis. Vertebrals:  Right vertebral artery demonstrates antegrade flow. Left vertebral              artery was not visualized. Subclavians: Normal flow hemodynamics were seen in bilateral subclavian              arteries. *See table(s) above for measurements and observations.  Electronically signed by Antony Contras MD on 03/20/2019 at 8:26:29 AM.    Final     Scheduled Meds: . amLODipine  10 mg Oral Daily  . aspirin  300 mg Rectal Daily   Or  . aspirin  325 mg Oral Daily  . heparin  5,000 Units Subcutaneous Q8H  . insulin aspart  0-5 Units Subcutaneous QHS  . insulin aspart  0-9 Units Subcutaneous TID WC    Continuous Infusions: . sodium chloride      Active Problems:   Dizziness    Time spent: 35 minutes   [facility]  [facility] Pager [phone] If 7PM-7AM, please contact night-coverage at [email or web address], password [facility] 03/20/2019, 1:18 PM  LOS: 1 day

## 2019-03-20 NOTE — Progress Notes (Signed)
Physical Therapy Discharge Patient Details Name: Macedonio Scallon MRN: [id number] DOB: 1929/02/22 Today's Date: 03/20/2019 Time: 3374-4514 PT Time Calculation (min) (ACUTE ONLY): 16 min  Patient discharged from PT services secondary to goals met and no further PT needs identified.  Please see latest therapy progress note for current level of functioning and progress toward goals.    Progress and discharge plan discussed with patient and/or caregiver: Patient/Caregiver agrees with plan  GP     Denice Paradise 03/20/2019, 9:55 AM  [place] Pager:  [phone]  Office:  [phone]

## 2019-03-21 LAB — BASIC METABOLIC PANEL
Anion gap: 8 (ref 5–15)
BUN: 24 mg/dL — ABNORMAL HIGH (ref 8–23)
CO2: 23 mmol/L (ref 22–32)
Calcium: 8.8 mg/dL — ABNORMAL LOW (ref 8.9–10.3)
Chloride: 106 mmol/L (ref 98–111)
Creatinine, Ser: 1.63 mg/dL — ABNORMAL HIGH (ref 0.61–1.24)
GFR calc Af Amer: 43 mL/min — ABNORMAL LOW (ref >60)
GFR calc non Af Amer: 37 mL/min — ABNORMAL LOW (ref >60)
Glucose, Bld: 96 mg/dL (ref 70–99)
Potassium: 3.3 mmol/L — ABNORMAL LOW (ref 3.5–5.1)
Sodium: 137 mmol/L (ref 135–145)

## 2019-03-21 LAB — GLUCOSE, CAPILLARY
Glucose-Capillary: 188 mg/dL — ABNORMAL HIGH (ref 70–99)
Glucose-Capillary: 134 mg/dL — ABNORMAL HIGH (ref 70–99)
Glucose-Capillary: 71 mg/dL (ref 70–99)

## 2019-03-21 MED ORDER — AMLODIPINE BESYLATE 10 MG PO TABS: 10.0000 mg | ORAL_TABLET | Freq: Every day | ORAL | 0 refills | Status: DC

## 2019-03-21 MED ORDER — POLYETHYLENE GLYCOL 3350 17 G PO PACK
17.0000 g | PACK | Freq: Every day | ORAL | Status: DC
  Administered 2019-03-21: 17 g via ORAL
  Filled 2019-03-21: qty 1

## 2019-03-21 MED ORDER — POLYETHYLENE GLYCOL 3350 17 G PO PACK: 17.0000 g | PACK | Freq: Every day | ORAL | 0 refills | Status: DC | PRN

## 2019-03-21 NOTE — Discharge Summary (Signed)
Physician Discharge Summary  Maurizio Geno Molony SJG:[id number] DOB: 09-Dec-1928 DOA: 03/18/2019  PCP: Isaac Bliss, Rayford Halsted, MD  Admit date: 03/18/2019 Discharge date: 03/21/2019  Time spent: 40 minutes  Recommendations for Outpatient Follow-up:  1. Follow-up with primary care physician in 1 week Discharged home with home health  Discharge Diagnoses:  Active Problems:   Dizziness   Discharge Condition: Stable Diet recommendation: Cardiac, diabetic Filed Weights   03/18/19 1445  Weight: 79.4 kg    History of present illness and Hospital Course:  85 year old pleasant male with history of atrial fibrillation s/p ablation 15 years back, not on any anticoagulation, hypertension, prediabetes, COPD, elevated PSA, CKD stage III, hypothyroidism presented to the emergency department with the sudden onset of swimmy headed, blurred vision.  Patient went to drink a glass of water, had an episode of vomiting.  As patient was having significant headache, came to the emergency department.  CT head without contrast did not show any acute abnormality.  Patient had recent cataract surgery to the left eye about 3 weeks ago at the [place].  MRI of the brain showed no acute intracranial abnormality.  Showed chronic right PCA territory infarct, chronic lacunar infarcts in the right pons, left corona radiate and also in the cerebellum.  Neck MRA is negative for cervical carotid stenosis but suggests high-grade stenosis at the right subclavian artery origin, moderate stenosis of the distal left vertebral artery..  Patient continued to have severe headache.  Patient is evaluated by neurology, as MRI showed no acute stroke, signed off.   ##dizziness -MRI negative for acute stroke -Showed chronic strokes in multiple locations -Patient denies any sinus congestion, however patient's symptoms improved with the physical therapy. -Keep the patient on prednisone treating as vestibular neuritis -Continue with the PT  OT-recommended home with home health with physical therapy  ##Headache -Could be from accelerated hypertension -Get CRP, sed rate considering patient's age ruling out for temporal arteritis -Continue with acetaminophen as needed -Improved  ##Hypertension accelerated -Continue with home medications -Started on amlodipine -Continue to follow-up  ##Paroxysmal atrial fibrillation -Obtain echocardiogram -Continue to monitor on telemetry  ##diabetes mellitus -A1c 6.2 -Continue with the sliding scale insulin  ##Acute on chronic renal insufficiency stage III -Patient's baseline creatinine is 1.5, trended up to 1.83 -Continue with gentle hydration and follow-up    Code Status: Full  family Communication: No family members are at bedside  disposition Plan: Based on physical therapy evaluation  Consultants: Neurology Discharge Exam: Vitals:   03/20/19 2019 03/21/19 0954  BP: (!) 160/91 (!) 173/97  Pulse: 75   Resp: 18   Temp: 98.3 F (36.8 C)   SpO2: 96%     GENERAL: No acute distress. Appears well.  HEENT: MMM.PERRL.Visual fields intact. Limited EOM examdue to intolerance. Hearing grossly intact. NECK: Supple. No apparent JVD. No carotid bruits. RESP: No IWOB. Good air movement bilaterally. CVS: RRR. Heart sounds normal.  ABD/GI/GU: Bowel sounds present. Soft. Non tender.  MSK/EXT: Moves extremities. No apparent deformity or edema.  SKIN: no apparent skin lesion or wound NEURO:Awake, alert and oriented appropriately. Cranial nerves II-XII intact but limited EOM examdue to intolerance, motor 5/5 in all muscle groups of UE and LE bilaterally, normal tone, light sensation intact in all dermatomes of upper and lower ext bilaterally, no pronator drift, biceps, patellar reflex symmetric. Finger-to-nose intact.  PSYCH: Calm. Normal affect.  Discharge Instructions   Discharge Instructions    Diet - low sodium heart healthy   Complete by: As directed  Increase activity slowly   Complete by: As directed      Allergies as of 03/21/2019   No Known Allergies     Medication List    STOP taking these medications   hydrochlorothiazide 12.5 MG tablet Commonly known as: HYDRODIURIL     TAKE these medications   amLODipine 10 MG tablet Commonly known as: NORVASC Take 1 tablet (10 mg total) by mouth daily. Start taking on: March 22, 2019   aspirin 325 MG tablet Take 325 mg by mouth 2 (two) times daily. Take 0.5 tablet once a day   atorvastatin 80 MG tablet Commonly known as: LIPITOR TAKE 1 TABLET DAILY   budesonide-formoterol 80-4.5 MCG/ACT inhaler Commonly known as: SYMBICORT Inhale 2 puffs into the lungs 2 (two) times daily.   clobetasol ointment 0.05 % Commonly known as: TEMOVATE APPLY TOPICALLY TWICE A DAY   diclofenac sodium 1 % Gel Commonly known as: VOLTAREN APPLY 4 GRAMS TOPICALLY FOUR TIMES A DAY AS NEEDED FOR KNEE PAIN   metoprolol tartrate 25 MG tablet Commonly known as: LOPRESSOR Take 12.5 mg by mouth 2 (two) times daily.   omega-3 acid ethyl esters 1 g capsule Commonly known as: LOVAZA Take 2 capsules (2 g total) by mouth 2 (two) times daily.   polyethylene glycol 17 g packet Commonly known as: MIRALAX / GLYCOLAX Take 17 g by mouth daily as needed for severe constipation.   sildenafil 100 MG tablet Commonly known as: VIAGRA TAKE 1 TABLET AS NEEDED ONE HOUR BEFORE NEEDED FOR ERECTILE DYSFUNCTION   Tricor 48 MG tablet Generic drug: fenofibrate TAKE 1 TABLET DAILY      No Known Allergies    The results of significant diagnostics from this hospitalization (including imaging, microbiology, ancillary and laboratory) are listed below for reference.    Significant Diagnostic Studies: Ct Head Wo Contrast  Result Date: 03/18/2019 CLINICAL DATA:  Altered level of consciousness with blurred vision and dizziness. Vomiting EXAM: CT HEAD WITHOUT CONTRAST TECHNIQUE: Contiguous axial images were obtained  from the base of the skull through the vertex without intravenous contrast. COMPARISON:  August 30, 2007 FINDINGS: Brain: There is mild diffuse atrophy. There is no intracranial mass, hemorrhage, extra-axial fluid collection, or midline shift. There is evidence of a prior infarct in the right occipital lobe. There is patchy small vessel disease in the centra semiovale bilaterally. There is evidence of a prior lacunar infarct in the inferior anterior left centrum semiovale slightly superior to the left basal ganglia. No acute infarct is demonstrable on this study. Vascular: There is no appreciable hyperdense vessel. There is calcification in each carotid siphon region. Skull: Bony calvarium appears intact. Sinuses/Orbits: There is mucosal thickening in each maxillary antrum. There is mucosal thickening and opacification in multiple ethmoid air cells, more severe on the right than on the left as well as mucosal thickening in the sphenoid sinuses, more on the right than on the left. Orbits appear symmetric bilaterally except for evidence of previous cataract removal on the left. Other: Mastoid air cells are clear. IMPRESSION: 1. Prior infarct right occipital lobe. Prior small infarct in the anterior inferior left centrum semiovale. There is mild diffuse atrophy with patchy periventricular small vessel disease. No acute infarct. No mass or hemorrhage. 2.  There are foci of arterial vascular calcification. 3.  Multiple foci of paranasal sinus disease. Electronically Signed   By: Lowella Grip III M.D.   On: 03/18/2019 15:34   Mr Jodene Nam Neck W Contrast  Result Date: 03/18/2019 CLINICAL DATA:  85 year old male with blurred vision and dizziness onset 1030 hours today. Vomiting and loss of balance. EXAM: MRI HEAD WITHOUT CONTRAST MRA NECK WITHOUT AND WITH CONTRAST TECHNIQUE: Multiplanar, multiecho pulse sequences of the brain and surrounding structures were obtained without intravenous contrast. Angiographic images of  the neck were obtained using MRA technique without and with intravenous contrast. Carotid stenosis measurements (when applicable) are obtained utilizing NASCET criteria, using the distal internal carotid diameter as the denominator. CONTRAST:  8 milliliters Gadavist COMPARISON:  Head CT without contrast earlier today. FINDINGS: MRI HEAD FINDINGS Brain: No restricted diffusion or evidence of acute infarction. Chronic encephalomalacia in the right PCA territory with trace hemosiderin. Small chronic white matter infarct in the left corona radiata. Minor T2 heterogeneity in the deep gray matter nuclei. Small chronic lacunar infarct in the right pons (series 5, image 7). Possible occasional tiny chronic infarcts in the cerebellum (series 5, image 4). Superimposed mild for age cerebral white matter T2 and FLAIR hyperintensity. No other cortical encephalomalacia or chronic blood products identified. No midline shift, mass effect, evidence of mass lesion, ventriculomegaly, extra-axial collection or acute intracranial hemorrhage. Cervicomedullary junction and pituitary are within normal limits. Vascular: Major intracranial vascular flow voids are preserved. Skull and upper cervical spine: Partially visible advanced cervical spine disc and endplate degeneration. Visualized bone marrow signal is within normal limits. Sinuses/Orbits: Postoperative changes to the left globe, otherwise negative orbits. Mild paranasal sinus mucosal thickening. Other: Mastoids are clear. Visible internal auditory structures appear normal. Scalp and face soft tissues appear negative. MRA NECK FINDINGS Precontrast time-of-flight images demonstrate antegrade flow in both cervical carotid and vertebral arteries. Evidence of a 3 vessel arch configuration. Codominant vertebral arteries. Antegrade flow continues to the skull base. Post-contrast neck MRA images reveal a 3 vessel arch configuration with proximal great vessel atherosclerosis. Tortuous right  CCA and right ICA in the neck. Irregularity at the right ICA bulb compatible with atherosclerosis but no hemodynamically significant stenosis. Visible right ICA siphon is patent. No significant proximal left CCA stenosis. Tortuous left CCA. Negative cervical left ICA aside from tortuosity. Visible left ICA siphon is patent. Evidence of high-grade stenosis at the right subclavian artery origin on series 1601, image 28 might be in part related to tortuosity with a kinked appearance. The right vertebral artery origin is normal. The right vertebral is patent to the skull base without stenosis. There is mild stenosis at the vertebrobasilar junction on series 1601, image 47. No proximal left subclavian artery stenosis despite plaque. The left vertebral artery origin is almost excluded from the slab but seems to remain normal. No left vertebral artery stenosis in the neck. There is mild-to-moderate left vertebral V4 segment stenosis just proximal to the patent PICA origin on series 1601, image 39. Mild stenosis at the left vertebrobasilar junction. IMPRESSION: 1.  No acute intracranial abnormality. 2. Chronic right PCA territory infarct. Chronic lacunar infarcts in the right pons, left corona radiata and perhaps also in the cerebellum. 3. Neck MRA is negative for Cervical Carotid stenosis, but suggests high-grade stenosis at the Right Subclavian Artery origin, and up to moderate stenosis of the distal Left Vertebral Artery (V4 segment). Electronically Signed   By: Genevie Ann M.D.   On: 03/18/2019 21:41   Mr Brain Wo Contrast  Result Date: 03/18/2019 CLINICAL DATA:  85 year old male with blurred vision and dizziness onset 1030 hours today. Vomiting and loss of balance. EXAM: MRI HEAD WITHOUT CONTRAST MRA NECK WITHOUT AND WITH CONTRAST TECHNIQUE: Multiplanar, multiecho pulse sequences of  the brain and surrounding structures were obtained without intravenous contrast. Angiographic images of the neck were obtained using MRA  technique without and with intravenous contrast. Carotid stenosis measurements (when applicable) are obtained utilizing NASCET criteria, using the distal internal carotid diameter as the denominator. CONTRAST:  8 milliliters Gadavist COMPARISON:  Head CT without contrast earlier today. FINDINGS: MRI HEAD FINDINGS Brain: No restricted diffusion or evidence of acute infarction. Chronic encephalomalacia in the right PCA territory with trace hemosiderin. Small chronic white matter infarct in the left corona radiata. Minor T2 heterogeneity in the deep gray matter nuclei. Small chronic lacunar infarct in the right pons (series 5, image 7). Possible occasional tiny chronic infarcts in the cerebellum (series 5, image 4). Superimposed mild for age cerebral white matter T2 and FLAIR hyperintensity. No other cortical encephalomalacia or chronic blood products identified. No midline shift, mass effect, evidence of mass lesion, ventriculomegaly, extra-axial collection or acute intracranial hemorrhage. Cervicomedullary junction and pituitary are within normal limits. Vascular: Major intracranial vascular flow voids are preserved. Skull and upper cervical spine: Partially visible advanced cervical spine disc and endplate degeneration. Visualized bone marrow signal is within normal limits. Sinuses/Orbits: Postoperative changes to the left globe, otherwise negative orbits. Mild paranasal sinus mucosal thickening. Other: Mastoids are clear. Visible internal auditory structures appear normal. Scalp and face soft tissues appear negative. MRA NECK FINDINGS Precontrast time-of-flight images demonstrate antegrade flow in both cervical carotid and vertebral arteries. Evidence of a 3 vessel arch configuration. Codominant vertebral arteries. Antegrade flow continues to the skull base. Post-contrast neck MRA images reveal a 3 vessel arch configuration with proximal great vessel atherosclerosis. Tortuous right CCA and right ICA in the neck.  Irregularity at the right ICA bulb compatible with atherosclerosis but no hemodynamically significant stenosis. Visible right ICA siphon is patent. No significant proximal left CCA stenosis. Tortuous left CCA. Negative cervical left ICA aside from tortuosity. Visible left ICA siphon is patent. Evidence of high-grade stenosis at the right subclavian artery origin on series 1601, image 28 might be in part related to tortuosity with a kinked appearance. The right vertebral artery origin is normal. The right vertebral is patent to the skull base without stenosis. There is mild stenosis at the vertebrobasilar junction on series 1601, image 47. No proximal left subclavian artery stenosis despite plaque. The left vertebral artery origin is almost excluded from the slab but seems to remain normal. No left vertebral artery stenosis in the neck. There is mild-to-moderate left vertebral V4 segment stenosis just proximal to the patent PICA origin on series 1601, image 39. Mild stenosis at the left vertebrobasilar junction. IMPRESSION: 1.  No acute intracranial abnormality. 2. Chronic right PCA territory infarct. Chronic lacunar infarcts in the right pons, left corona radiata and perhaps also in the cerebellum. 3. Neck MRA is negative for Cervical Carotid stenosis, but suggests high-grade stenosis at the Right Subclavian Artery origin, and up to moderate stenosis of the distal Left Vertebral Artery (V4 segment). Electronically Signed   By: Genevie Ann M.D.   On: 03/18/2019 21:41   Vas US Carotid (at [facility] Only)  Result Date: 03/20/2019 Carotid Arterial Duplex Study Indications:       TIA and dizziness. Risk Factors:      Hypertension, hyperlipidemia. Comparison Study:  no prior Performing Technologist: Abram Sander RVS  Examination Guidelines: A complete evaluation includes B-mode imaging, spectral Doppler, color Doppler, and power Doppler as needed of all accessible portions of each vessel. Bilateral testing is considered  an integral  part of a complete examination. Limited examinations for reoccurring indications may be performed as noted.  Right Carotid Findings: +----------+--------+--------+--------+-----------+--------+           PSV cm/sEDV cm/sStenosisDescribe   Comments +----------+--------+--------+--------+-----------+--------+ CCA Prox  74      14              homogeneous         +----------+--------+--------+--------+-----------+--------+ CCA Distal64      15              homogeneous         +----------+--------+--------+--------+-----------+--------+ ICA Prox  47      14      1-39%   homogeneous         +----------+--------+--------+--------+-----------+--------+ ICA Distal48      14                                  +----------+--------+--------+--------+-----------+--------+ ECA       140     17                                  +----------+--------+--------+--------+-----------+--------+ +----------+--------+-------+--------+-------------------+           PSV cm/sEDV cmsDescribeArm Pressure (mmHG) +----------+--------+-------+--------+-------------------+ [id number]                                         +----------+--------+-------+--------+-------------------+ +---------+--------+--+--------+-+---------+ VertebralPSV cm/s33EDV cm/s8Antegrade +---------+--------+--+--------+-+---------+  Left Carotid Findings: +----------+--------+--------+--------+-----------+--------+           PSV cm/sEDV cm/sStenosisDescribe   Comments +----------+--------+--------+--------+-----------+--------+ CCA Prox  64      16              homogeneous         +----------+--------+--------+--------+-----------+--------+ CCA Distal69      16              homogeneous         +----------+--------+--------+--------+-----------+--------+ ICA Prox  55      18      1-39%   homogeneous         +----------+--------+--------+--------+-----------+--------+ ICA  Distal37      8                                   +----------+--------+--------+--------+-----------+--------+ ECA       233     18                                  +----------+--------+--------+--------+-----------+--------+ +----------+--------+--------+--------+-------------------+ SubclavianPSV cm/sEDV cm/sDescribeArm Pressure (mmHG) +----------+--------+--------+--------+-------------------+           53                                          +----------+--------+--------+--------+-------------------+ +---------+--------+--------+--------------+ VertebralPSV cm/sEDV cm/sNot identified +---------+--------+--------+--------------+  Summary: Right Carotid: Velocities in the right ICA are consistent with a 1-39% stenosis. Left Carotid: Velocities in the left ICA are consistent with a 1-39% stenosis. Vertebrals:  Right vertebral artery demonstrates antegrade flow. Left vertebral  artery was not visualized. Subclavians: Normal flow hemodynamics were seen in bilateral subclavian              arteries. *See table(s) above for measurements and observations.  Electronically signed by Antony Contras MD on 03/20/2019 at 8:26:29 AM.    Final     Microbiology: Recent Results (from the past 240 hour(s))  SARS CORONAVIRUS 2 Nasal Swab Urine, Clean Catch     Status: None   Collection Time: 03/18/19  7:35 PM   Specimen: Urine, Clean Catch; Nasal Swab  Result Value Ref Range Status   SARS Coronavirus 2 NEGATIVE NEGATIVE Final    Comment: (NOTE) SARS-CoV-2 target nucleic acids are NOT DETECTED. The SARS-CoV-2 RNA is generally detectable in upper and lower respiratory specimens during the acute phase of infection. Negative results do not preclude SARS-CoV-2 infection, do not rule out co-infections with other pathogens, and should not be used as the sole basis for treatment or other patient management decisions. Negative results must be combined with clinical  observations, patient history, and epidemiological information. The expected result is Negative. Fact Sheet for Patients: SugarRoll.be Fact Sheet for Healthcare Providers: https://www.woods-mathews.com/ This test is not yet approved or cleared by the [place] FDA and  has been authorized for detection and/or diagnosis of SARS-CoV-2 by FDA under an Emergency Use Authorization (EUA). This EUA will remain  in effect (meaning this test can be used) for the duration of the COVID-19 declaration under Section 56 4(b)(1) of the Act, 21 U.S.C. section 360bbb-3(b)(1), unless the authorization is terminated or revoked sooner. Performed at [facility], [place] [address] [place], [place] [address]      Labs: Basic Metabolic Panel: Recent Labs  Lab 03/18/19 1528 03/21/19 0406  NA 139 137  K 3.8 3.3*  CL 105 106  CO2 22 23  GLUCOSE 126* 96  BUN 32* 24*  CREATININE 1.83* 1.63*  CALCIUM 9.3 8.8*   Liver Function Tests: Recent Labs  Lab 03/18/19 1528  AST 19  ALT 16  ALKPHOS 82  BILITOT 1.5*  PROT 7.6  ALBUMIN 4.0   No results for input(s): LIPASE, AMYLASE in the last 168 hours. No results for input(s): AMMONIA in the last 168 hours. CBC: Recent Labs  Lab 03/18/19 1528  WBC 10.7*  NEUTROABS 7.4  HGB 15.5  HCT 48.4  MCV 95.1  PLT 185   Cardiac Enzymes: No results for input(s): CKTOTAL, CKMB, CKMBINDEX, TROPONINI in the last 168 hours. BNP: BNP (last 3 results) No results for input(s): BNP in the last 8760 hours.  ProBNP (last 3 results) No results for input(s): PROBNP in the last 8760 hours.  CBG: Recent Labs  Lab 03/20/19 1731 03/20/19 2136 03/21/19 0825 03/21/19 0940 03/21/19 1144  GLUCAP 74 92 188* 134* 71       Signed:  Christan Ciccarelli MD.  [facility] 03/21/2019, 3:12 PM

## 2019-03-21 NOTE — Progress Notes (Signed)
Nsg Discharge Note  Admit Date:  03/18/2019 Discharge date: 03/21/2019   Julio Sicks to be D/C'd Home per MD order.  AVS completed.  Copy for chart, and copy for patient signed, and dated. Patient/caregiver able to verbalize understanding.  Discharge Medication: Allergies as of 03/21/2019   No Known Allergies     Medication List    STOP taking these medications   hydrochlorothiazide 12.5 MG tablet Commonly known as: HYDRODIURIL     TAKE these medications   amLODipine 10 MG tablet Commonly known as: NORVASC Take 1 tablet (10 mg total) by mouth daily. Start taking on: March 22, 2019   aspirin 325 MG tablet Take 325 mg by mouth 2 (two) times daily. Take 0.5 tablet once a day   atorvastatin 80 MG tablet Commonly known as: LIPITOR TAKE 1 TABLET DAILY   budesonide-formoterol 80-4.5 MCG/ACT inhaler Commonly known as: SYMBICORT Inhale 2 puffs into the lungs 2 (two) times daily.   clobetasol ointment 0.05 % Commonly known as: TEMOVATE APPLY TOPICALLY TWICE A DAY   diclofenac sodium 1 % Gel Commonly known as: VOLTAREN APPLY 4 GRAMS TOPICALLY FOUR TIMES A DAY AS NEEDED FOR KNEE PAIN   metoprolol tartrate 25 MG tablet Commonly known as: LOPRESSOR Take 12.5 mg by mouth 2 (two) times daily.   omega-3 acid ethyl esters 1 g capsule Commonly known as: LOVAZA Take 2 capsules (2 g total) by mouth 2 (two) times daily.   polyethylene glycol 17 g packet Commonly known as: MIRALAX / GLYCOLAX Take 17 g by mouth daily as needed for severe constipation.   sildenafil 100 MG tablet Commonly known as: VIAGRA TAKE 1 TABLET AS NEEDED ONE HOUR BEFORE NEEDED FOR ERECTILE DYSFUNCTION   Tricor 48 MG tablet Generic drug: fenofibrate TAKE 1 TABLET DAILY       Discharge Assessment: Vitals:   03/20/19 2019 03/21/19 0954  BP: (!) 160/91 (!) 173/97  Pulse: 75   Resp: 18   Temp: 98.3 F (36.8 C)   SpO2: 96%    Skin clean, dry and intact without evidence of skin break down, no  evidence of skin tears noted. IV catheter discontinued intact. Site without signs and symptoms of complications - no redness or edema noted at insertion site, patient denies c/o pain - only slight tenderness at site.  Dressing with slight pressure applied.  D/c Instructions-Education: Discharge instructions given to patient/family with verbalized understanding. D/c education completed with patient/family including follow up instructions, medication list, d/c activities limitations if indicated, with other d/c instructions as indicated by MD - patient able to verbalize understanding, all questions fully answered. Patient instructed to return to ED, call 911, or call MD for any changes in condition.  Patient escorted via [place], and D/C home via private auto.  Tresa Endo, RN 03/21/2019 1:22 PM

## 2019-04-08 ENCOUNTER — Ambulatory Visit (INDEPENDENT_AMBULATORY_CARE_PROVIDER_SITE_OTHER): Payer: Medicare Other | Admitting: Internal Medicine

## 2019-04-08 ENCOUNTER — Other Ambulatory Visit: Payer: Self-pay

## 2019-04-08 DIAGNOSIS — H8122 Vestibular neuronitis, left ear: Secondary | ICD-10-CM | POA: Diagnosis not present

## 2019-04-08 DIAGNOSIS — R42 Dizziness and giddiness: Secondary | ICD-10-CM

## 2019-04-08 NOTE — Progress Notes (Signed)
Virtual Visit via Telephone Note  I connected with Brendan Long on 04/08/19 at  3:30 PM EDT by telephone and verified that I am speaking with the correct person using two identifiers.   I discussed the limitations, risks, security and privacy concerns of performing an evaluation and management service by telephone and the availability of in person appointments. I also discussed with the patient that there may be a patient responsible charge related to this service. The patient expressed understanding and agreed to proceed.  Location patient: home Location provider: work office Participants present for the call: patient, provider Patient did not have a visit in the prior 7 days to address this/these issue(s).   History of Present Illness:  He has scheduled this visit today because he continues to have issues with dizziness and blurred vision.  He was hospitalized on August 11 through August 14 with new onset vertigo, nystagmus and gait imbalance.  He was evaluated by neurology, neurology's notes indicate: Exam reveals nystagmus on far leftward gaze, no ataxia or motor weakness noted, MRI of the brain without acute stroke, chronic right PCA territory infarct is noted in addition to chronic lacunar infarcts in the right pons.  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  His opinion is that patient's overall presentation in the context of a negative MRI is most consistent with a left-sided peripheral vestibulopathy.  He was diagnosed with vestibular neuritis and placed on a prednisone taper which he has now completed.  Neurology did recommend an ENT consult but this was not done while hospitalized.  He continues to have significant dizziness and blurry vision and wonders as to what are the next steps.   Observations/Objective: Patient sounds cheerful and well on the phone. I do not appreciate any increased  work of breathing. Speech and thought processing are grossly intact. Patient reported vitals: None reported   Current Outpatient Medications:  .  amLODipine (NORVASC) 10 MG tablet, Take 1 tablet (10 mg total) by mouth daily., Disp: 30 tablet, Rfl: 0 .  aspirin 325 MG tablet, Take 325 mg by mouth 2 (two) times daily. Take 0.5 tablet once a day, Disp: , Rfl:  .  atorvastatin (LIPITOR) 80 MG tablet, TAKE 1 TABLET DAILY, Disp: 90 tablet, Rfl: 2 .  budesonide-formoterol (SYMBICORT) 80-4.5 MCG/ACT inhaler, Inhale 2 puffs into the lungs 2 (two) times daily., Disp: 1 Inhaler, Rfl: 3 .  clobetasol ointment (TEMOVATE) 0.05 %, APPLY TOPICALLY TWICE A DAY, Disp: 30 g, Rfl: 23 .  diclofenac sodium (VOLTAREN) 1 % GEL, APPLY 4 GRAMS TOPICALLY FOUR TIMES A DAY AS NEEDED FOR KNEE PAIN, Disp: 200 g, Rfl: 28 .  metoprolol tartrate (LOPRESSOR) 25 MG tablet, Take 12.5 mg by mouth 2 (two) times daily., Disp: , Rfl:  .  omega-3 acid ethyl esters (LOVAZA) 1 G capsule, Take 2 capsules (2 g total) by mouth 2 (two) times daily., Disp: 120 capsule, Rfl: 11 .  polyethylene glycol (MIRALAX / GLYCOLAX) 17 g packet, Take 17 g by mouth daily as needed for severe constipation., Disp: 14 each, Rfl: 0 .  sildenafil (VIAGRA) 100 MG tablet, TAKE 1 TABLET AS NEEDED ONE HOUR BEFORE NEEDED FOR ERECTILE DYSFUNCTION (Patient not taking: Reported on 03/18/2019), Disp: 10 tablet, Rfl: 7 .  TRICOR 48 MG tablet, TAKE 1 TABLET DAILY, Disp: 90 tablet, Rfl: 4  Review of Systems:  Constitutional: Denies fever, chills, diaphoresis, appetite change and fatigue.  HEENT: Denies photophobia, eye pain, redness, hearing loss, ear pain, congestion, sore throat, rhinorrhea, sneezing, mouth sores, trouble swallowing, neck pain, neck stiffness and tinnitus.   Respiratory: Denies SOB, DOE, cough, chest tightness,  and wheezing.   Cardiovascular: Denies chest pain, palpitations and leg swelling.  Gastrointestinal: Denies nausea, vomiting, abdominal pain,  diarrhea, constipation, blood in stool and abdominal distention.  Genitourinary: Denies dysuria, urgency, frequency, hematuria, flank pain and difficulty urinating.  Endocrine: Denies: hot or cold intolerance, sweats, changes in hair or nails, polyuria, polydipsia. Musculoskeletal: Denies myalgias, back pain, joint swelling, arthralgias and gait problem.  Skin: Denies pallor, rash and wound.  Neurological: Deniesseizures, syncope, weakness,  Numbness. Hematological: Denies adenopathy. Easy bruising, personal or family bleeding history  Psychiatric/Behavioral: Denies suicidal ideation, mood changes, confusion, nervousness, sleep disturbance and agitation   Assessment and Plan:  Dizziness Vestibular neuronitis of left ear  -Will refer to ENT per neurology recommendations. -Will also arrange for him to have vestibular PT.    I discussed the assessment and treatment plan with the patient. The patient was provided an opportunity to ask questions and all were answered. The patient agreed with the plan and demonstrated an understanding of the instructions.   The patient was advised to call back or seek an in-person evaluation if the symptoms worsen or if the condition fails to improve as anticipated.  I provided 24 minutes of non-face-to-face time during this encounter.   Lelon Frohlich, MD [facility]

## 2019-04-08 NOTE — Addendum Note (Signed)
Addended by: Westley Hummer B on: 04/08/2019 05:10 PM   Modules accepted: Orders

## 2019-05-06 DIAGNOSIS — H9313 Tinnitus, bilateral: Secondary | ICD-10-CM | POA: Diagnosis not present

## 2019-05-06 DIAGNOSIS — H903 Sensorineural hearing loss, bilateral: Secondary | ICD-10-CM | POA: Diagnosis not present

## 2019-05-06 DIAGNOSIS — R42 Dizziness and giddiness: Secondary | ICD-10-CM | POA: Diagnosis not present

## 2019-06-04 ENCOUNTER — Telehealth: Payer: Self-pay | Admitting: *Deleted

## 2019-06-04 NOTE — Telephone Encounter (Signed)
Left message on machine for patient to return our call 

## 2019-06-04 NOTE — Telephone Encounter (Signed)
Copied from [place] [id number]. Topic: General - Other >> Jun 04, 2019  1:38 PM Leward Quan A wrote: Reason for CRM: Patient called to request an appointment with Dr Jerilee Hoh asked what the visit was for he stated its ok i've been through all this crap before and will find another doctor. Patient then hung up the phone. Not sure what happen or why just his reaction. >> Jun 04, 2019  2:57 PM Cox, Melburn Hake, CMA wrote: The phone call with the [facility] lasted 49 seconds. The agent was very polite with the patient. As soon as she asked him if the visit would be a regular visit or if he had any specific concerns the pt states "I have gone through all this crap before so just forget it, I will get another doctor" and disconnected the call. There was no error on the part of the [facility].

## 2019-07-14 ENCOUNTER — Other Ambulatory Visit: Payer: Self-pay | Admitting: Endocrinology

## 2019-07-14 NOTE — Telephone Encounter (Signed)
Please advise 

## 2019-07-14 NOTE — Telephone Encounter (Signed)
Please forward refill request to pt's primary care provider.   

## 2019-07-15 NOTE — Telephone Encounter (Signed)
Per Dr. Ellison's request, I am forwarding this refill request. Please review and refill if appropriate.  

## 2019-09-22 ENCOUNTER — Telehealth: Payer: Self-pay | Admitting: Internal Medicine

## 2019-09-22 NOTE — Telephone Encounter (Signed)
Medication Refill: Viagra  Pharmacy: [facility] [place]  [phone]

## 2019-09-23 ENCOUNTER — Other Ambulatory Visit: Payer: Self-pay | Admitting: *Deleted

## 2019-09-23 DIAGNOSIS — N529 Male erectile dysfunction, unspecified: Secondary | ICD-10-CM

## 2019-09-23 MED ORDER — SILDENAFIL CITRATE 100 MG PO TABS: ORAL_TABLET | ORAL | 2 refills | Status: DC

## 2019-12-05 ENCOUNTER — Ambulatory Visit (INDEPENDENT_AMBULATORY_CARE_PROVIDER_SITE_OTHER): Payer: Medicare Other | Admitting: Internal Medicine

## 2019-12-05 ENCOUNTER — Encounter: Payer: Self-pay | Admitting: Internal Medicine

## 2019-12-05 ENCOUNTER — Other Ambulatory Visit: Payer: Self-pay

## 2019-12-05 VITALS — BP 200/110 | HR 65 | Temp 97.5°F | Ht 67.5 in | Wt 178.9 lb

## 2019-12-05 DIAGNOSIS — E538 Deficiency of other specified B group vitamins: Secondary | ICD-10-CM | POA: Diagnosis not present

## 2019-12-05 DIAGNOSIS — E039 Hypothyroidism, unspecified: Secondary | ICD-10-CM

## 2019-12-05 DIAGNOSIS — M1990 Unspecified osteoarthritis, unspecified site: Secondary | ICD-10-CM

## 2019-12-05 DIAGNOSIS — Z0000 Encounter for general adult medical examination without abnormal findings: Secondary | ICD-10-CM | POA: Diagnosis not present

## 2019-12-05 DIAGNOSIS — E785 Hyperlipidemia, unspecified: Secondary | ICD-10-CM | POA: Diagnosis not present

## 2019-12-05 DIAGNOSIS — E559 Vitamin D deficiency, unspecified: Secondary | ICD-10-CM

## 2019-12-05 DIAGNOSIS — I10 Essential (primary) hypertension: Secondary | ICD-10-CM | POA: Diagnosis not present

## 2019-12-05 LAB — CBC WITH DIFFERENTIAL/PLATELET
Basophils Absolute: 0 10*3/uL (ref 0.0–0.1)
Basophils Relative: 0.5 % (ref 0.0–3.0)
Eosinophils Absolute: 0.7 10*3/uL (ref 0.0–0.7)
Eosinophils Relative: 8.8 % — ABNORMAL HIGH (ref 0.0–5.0)
HCT: 44.6 % (ref 39.0–52.0)
Hemoglobin: 15.2 g/dL (ref 13.0–17.0)
Lymphocytes Relative: 32 % (ref 12.0–46.0)
Lymphs Abs: 2.4 10*3/uL (ref 0.7–4.0)
MCHC: 34.1 g/dL (ref 30.0–36.0)
MCV: 93.2 fl (ref 78.0–100.0)
Monocytes Absolute: 0.5 10*3/uL (ref 0.1–1.0)
Monocytes Relative: 6.3 % (ref 3.0–12.0)
Neutro Abs: 4 10*3/uL (ref 1.4–7.7)
Neutrophils Relative %: 52.4 % (ref 43.0–77.0)
Platelets: 199 10*3/uL (ref 150.0–400.0)
RBC: 4.78 Mil/uL (ref 4.22–5.81)
RDW: 13 % (ref 11.5–15.5)
WBC: 7.6 10*3/uL (ref 4.0–10.5)

## 2019-12-05 LAB — LIPID PANEL
Cholesterol: 169 mg/dL (ref 0–200)
HDL: 28.4 mg/dL — ABNORMAL LOW (ref >39.00)
NonHDL: 140.37
Total CHOL/HDL Ratio: 6
Triglycerides: 217 mg/dL — ABNORMAL HIGH (ref 0.0–149.0)
VLDL: 43.4 mg/dL — ABNORMAL HIGH (ref 0.0–40.0)

## 2019-12-05 LAB — COMPREHENSIVE METABOLIC PANEL
ALT: 9 U/L (ref 0–53)
AST: 13 U/L (ref 0–37)
Albumin: 3.8 g/dL (ref 3.5–5.2)
Alkaline Phosphatase: 94 U/L (ref 39–117)
BUN: 27 mg/dL — ABNORMAL HIGH (ref 6–23)
CO2: 31 mEq/L (ref 19–32)
Calcium: 9 mg/dL (ref 8.4–10.5)
Chloride: 103 mEq/L (ref 96–112)
Creatinine, Ser: 1.66 mg/dL — ABNORMAL HIGH (ref 0.40–1.50)
GFR: 39.2 mL/min — ABNORMAL LOW (ref >60.00)
Glucose, Bld: 99 mg/dL (ref 70–99)
Potassium: 4.1 mEq/L (ref 3.5–5.1)
Sodium: 140 mEq/L (ref 135–145)
Total Bilirubin: 1.2 mg/dL (ref 0.2–1.2)
Total Protein: 6.6 g/dL (ref 6.0–8.3)

## 2019-12-05 LAB — VITAMIN D 25 HYDROXY (VIT D DEFICIENCY, FRACTURES): VITD: 20.21 ng/mL — ABNORMAL LOW (ref 30.00–100.00)

## 2019-12-05 LAB — LDL CHOLESTEROL, DIRECT: Direct LDL: 89 mg/dL

## 2019-12-05 LAB — TSH: TSH: 2.79 u[IU]/mL (ref 0.35–4.50)

## 2019-12-05 LAB — HEMOGLOBIN A1C: Hgb A1c MFr Bld: 6 % (ref 4.6–6.5)

## 2019-12-05 LAB — VITAMIN B12: Vitamin B-12: 321 pg/mL (ref 211–911)

## 2019-12-05 MED ORDER — METOPROLOL TARTRATE 25 MG PO TABS: 25.0000 mg | ORAL_TABLET | Freq: Two times a day (BID) | ORAL | 1 refills | Status: DC

## 2019-12-05 MED ORDER — HYDROCODONE-ACETAMINOPHEN 5-300 MG PO TABS: 1.0000 | ORAL_TABLET | Freq: Four times a day (QID) | ORAL | 0 refills | Status: DC | PRN

## 2019-12-05 NOTE — Patient Instructions (Signed)
-Nice seeing you today!!  -Increase metoprolol to 25 mg twice daily.  -Come back in 2 weeks for follow up of your blood pressure.   Preventive Care 84 Years and Older, Male Preventive care refers to lifestyle choices and visits with your health care provider that can promote health and wellness. This includes:  A yearly physical exam. This is also called an annual well check.  Regular dental and eye exams.  Immunizations.  Screening for certain conditions.  Healthy lifestyle choices, such as diet and exercise. What can I expect for my preventive care visit? Physical exam Your health care provider will check:  Height and weight. These may be used to calculate body mass index (BMI), which is a measurement that tells if you are at a healthy weight.  Heart rate and blood pressure.  Your skin for abnormal spots. Counseling Your health care provider may ask you questions about:  Alcohol, tobacco, and drug use.  Emotional well-being.  Home and relationship well-being.  Sexual activity.  Eating habits.  History of falls.  Memory and ability to understand (cognition).  Work and work environment. What immunizations do I need?  Influenza (flu) vaccine  This is recommended every year. Tetanus, diphtheria, and pertussis (Tdap) vaccine  You may need a Td booster every 10 years. Varicella (chickenpox) vaccine  You may need this vaccine if you have not already been vaccinated. Zoster (shingles) vaccine  You may need this after age 60. Pneumococcal conjugate (PCV13) vaccine  One dose is recommended after age 65. Pneumococcal polysaccharide (PPSV23) vaccine  One dose is recommended after age 65. Measles, mumps, and rubella (MMR) vaccine  You may need at least one dose of MMR if you were born in 1957 or later. You may also need a second dose. Meningococcal conjugate (MenACWY) vaccine  You may need this if you have certain conditions. Hepatitis A vaccine  You may  need this if you have certain conditions or if you travel or work in places where you may be exposed to hepatitis A. Hepatitis B vaccine  You may need this if you have certain conditions or if you travel or work in places where you may be exposed to hepatitis B. Haemophilus influenzae type b (Hib) vaccine  You may need this if you have certain conditions. You may receive vaccines as individual doses or as more than one vaccine together in one shot (combination vaccines). Talk with your health care provider about the risks and benefits of combination vaccines. What tests do I need? Blood tests  Lipid and cholesterol levels. These may be checked every 5 years, or more frequently depending on your overall health.  Hepatitis C test.  Hepatitis B test. Screening  Lung cancer screening. You may have this screening every year starting at age 55 if you have a 30-pack-year history of smoking and currently smoke or have quit within the past 15 years.  Colorectal cancer screening. All adults should have this screening starting at age 50 and continuing until age 75. Your health care provider may recommend screening at age 45 if you are at increased risk. You will have tests every 1-10 years, depending on your results and the type of screening test.  Prostate cancer screening. Recommendations will vary depending on your family history and other risks.  Diabetes screening. This is done by checking your blood sugar (glucose) after you have not eaten for a while (fasting). You may have this done every 1-3 years.  Abdominal aortic aneurysm (AAA) screening. You   may need this if you are a current or former smoker.  Sexually transmitted disease (STD) testing. Follow these instructions at home: Eating and drinking  Eat a diet that includes fresh fruits and vegetables, whole grains, lean protein, and low-fat dairy products. Limit your intake of foods with high amounts of sugar, saturated fats, and  salt.  Take vitamin and mineral supplements as recommended by your health care provider.  Do not drink alcohol if your health care provider tells you not to drink.  If you drink alcohol: ? Limit how much you have to 0-2 drinks a day. ? Be aware of how much alcohol is in your drink. In the [place] one drink equals one 12 oz bottle of beer (355 mL), one 5 oz glass of wine (148 mL), or one 1 oz glass of hard liquor (44 mL). Lifestyle  Take daily care of your teeth and gums.  Stay active. Exercise for at least 30 minutes on 5 or more days each week.  Do not use any products that contain nicotine or tobacco, such as cigarettes, e-cigarettes, and chewing tobacco. If you need help quitting, ask your health care provider.  If you are sexually active, practice safe sex. Use a condom or other form of protection to prevent STIs (sexually transmitted infections).  Talk with your health care provider about taking a low-dose aspirin or statin. What's next?  Visit your health care provider once a year for a well check visit.  Ask your health care provider how often you should have your eyes and teeth checked.  Stay up to date on all vaccines. This information is not intended to replace advice given to you by your health care provider. Make sure you discuss any questions you have with your health care provider. Document Revised: 07/18/2018 Document Reviewed: 07/18/2018 Elsevier Patient Education  2020 [employer]  

## 2019-12-05 NOTE — Progress Notes (Signed)
Established Patient Office Visit     This visit occurred during the SARS-CoV-2 public health emergency.  Safety protocols were in place, including screening questions prior to the visit, additional usage of staff PPE, and extensive cleaning of exam room while observing appropriate contact time as indicated for disinfecting solutions.    CC/Reason for Visit: Subsequent Medicare wellness visit and follow-up chronic medical conditions  HPI: Brendan Long is a 86 y.o. male who is coming in today for the above mentioned reasons. Past Medical History is significant for: Hyperlipidemia statin and fibrate, history of hypertension, history of vitamin D deficiency as well as a left eye cataract status post surgery last year.  He has some acute concerns today.  1.  Right-sided hip pain that is usually well resolved with hydrocodone he just took his last pill last night and would like a refill today.  2.  He has been having a lot of neck pain, saw his physician at the [place] who thought it was a muscle spasm and he has been doing physical therapy with some relief.  3.  He is concerned about his elevated blood pressures.  He has routine eye care, has not seen a dentist in years.  He remains physically active with walking every day and doing 25 push-ups on a daily basis.  No perceived issues with his hearing.  He has had both of his Covid vaccines.   Past Medical/Surgical History: Past Medical History:  Diagnosis Date  . ED (erectile dysfunction)   . Hyperlipidemia   . Hypertension   . Hypothyroidism   . Psoriasis   . Varicose veins   . Vitamin D deficiency     Past Surgical History:  Procedure Laterality Date  . JOINT REPLACEMENT    . joint replacement rt and left    . rotator cuff repair, right shoulder per Dr. Tonita Cong  1996  . torn right bicepts muscle  1996  . TOTAL HIP ARTHROPLASTY  01/17/10   redone on right hip on 09/05/10    Social History:  reports that he has quit  smoking. He has never used smokeless tobacco. He reports that he does not drink alcohol or use drugs.  Allergies: No Known Allergies  Family History:  Family History  Problem Relation Age of Onset  . Breast cancer Other      Current Outpatient Medications:  .  amLODipine (NORVASC) 10 MG tablet, Take 1 tablet (10 mg total) by mouth daily., Disp: 30 tablet, Rfl: 0 .  aspirin 325 MG tablet, Take 325 mg by mouth 2 (two) times daily. Take 0.5 tablet once a day, Disp: , Rfl:  .  atorvastatin (LIPITOR) 80 MG tablet, TAKE 1 TABLET DAILY, Disp: 90 tablet, Rfl: 2 .  budesonide-formoterol (SYMBICORT) 80-4.5 MCG/ACT inhaler, Inhale 2 puffs into the lungs 2 (two) times daily., Disp: 1 Inhaler, Rfl: 3 .  clobetasol ointment (TEMOVATE) 0.05 %, APPLY TOPICALLY TWICE A DAY, Disp: 30 g, Rfl: 23 .  diclofenac sodium (VOLTAREN) 1 % GEL, APPLY 4 GRAMS TOPICALLY FOUR TIMES A DAY AS NEEDED FOR KNEE PAIN, Disp: 200 g, Rfl: 28 .  metoprolol tartrate (LOPRESSOR) 25 MG tablet, Take 1 tablet (25 mg total) by mouth 2 (two) times daily., Disp: 180 tablet, Rfl: 1 .  polyethylene glycol (MIRALAX / GLYCOLAX) 17 g packet, Take 17 g by mouth daily as needed for severe constipation., Disp: 14 each, Rfl: 0 .  sildenafil (VIAGRA) 100 MG tablet, TAKE 1 TABLET AS NEEDED  ONE HOUR BEFORE NEEDED FOR ERECTILE DYSFUNCTION, Disp: 10 tablet, Rfl: 2 .  TRICOR 48 MG tablet, TAKE 1 TABLET DAILY, Disp: 90 tablet, Rfl: 3 .  HYDROcodone-Acetaminophen 5-300 MG TABS, Take 1 tablet by mouth every 6 (six) hours as needed., Disp: 30 tablet, Rfl: 0 .  omega-3 acid ethyl esters (LOVAZA) 1 G capsule, Take 2 capsules (2 g total) by mouth 2 (two) times daily., Disp: 120 capsule, Rfl: 11  Review of Systems:  Constitutional: Denies fever, chills, diaphoresis, appetite change and fatigue.  HEENT: Denies photophobia, eye pain, redness, hearing loss, ear pain, congestion, sore throat, rhinorrhea, sneezing, mouth sores, trouble swallowing, neck pain, neck  stiffness and tinnitus.   Respiratory: Denies SOB, DOE, cough, chest tightness,  and wheezing.   Cardiovascular: Denies chest pain, palpitations and leg swelling.  Gastrointestinal: Denies nausea, vomiting, abdominal pain, diarrhea, constipation, blood in stool and abdominal distention.  Genitourinary: Denies dysuria, urgency, frequency, hematuria, flank pain and difficulty urinating.  Endocrine: Denies: hot or cold intolerance, sweats, changes in hair or nails, polyuria, polydipsia. Musculoskeletal: Denies joint swelling, arthralgias and gait problem.  Skin: Denies pallor, rash and wound.  Neurological: Denies dizziness, seizures, syncope, weakness, light-headedness, numbness and headaches.  Hematological: Denies adenopathy. Easy bruising, personal or family bleeding history  Psychiatric/Behavioral: Denies suicidal ideation, mood changes, confusion, nervousness, sleep disturbance and agitation    Physical Exam: Vitals:   12/05/19 1005  BP: (!) 200/110  Pulse: 65  Temp: (!) 97.5 F (36.4 C)  TempSrc: Temporal  SpO2: 97%  Weight: 178 lb 14.4 oz (81.1 kg)  Height: 5' 7.5" (1.715 m)    Body mass index is 27.61 kg/m.   Constitutional: NAD, calm, comfortable Eyes: PERRL, lids and conjunctivae normal ENMT: Mucous membranes are moist.  Tympanic membrane is pearly white, no erythema or bulging. Neck: normal, supple, no masses, no thyromegaly Respiratory: clear to auscultation bilaterally, no wheezing, no crackles. Normal respiratory effort. No accessory muscle use.  Cardiovascular: Regular rate and rhythm, no murmurs / rubs / gallops. No extremity edema. 2+ pedal pulses. No carotid bruits.  Abdomen: no tenderness, no masses palpated. No hepatosplenomegaly. Bowel sounds positive.  Musculoskeletal: no clubbing / cyanosis. No joint deformity upper and lower extremities. Good ROM, no contractures. Normal muscle tone.  Skin: no rashes, lesions, ulcers. No induration Neurologic: CN 2-12  grossly intact. Sensation intact, DTR normal. Strength 5/5 in all 4.  Psychiatric: Normal judgment and insight. Alert and oriented x 3. Normal mood.    Subsequent Medicare wellness visit   1. Risk factors, based on past  M,S,F -cardiovascular disease risk factors include age, gender, history of hypertension, history of hyperlipidemia.   2.  Physical activities: Walks and does push-ups every day   3.  Depression/mood:  Stable, not depressed   4.  Hearing:  No perceived issues   5.  ADL's: Independent in all ADLs   6.  Fall risk:  Moderate fall risk   7.  Home safety: No problems identified   8.  Height weight, and visual acuity: Height and weight as above, visual acuity is 20/32 with each eye independently and 20/25 with eyes together   9.  Counseling:  We have discussed healthy lifestyle   10. Lab orders based on risk factors: Laboratory update will be reviewed   11. Referral :  None today   12. Care plan:  Follow-up in 2 weeks for blood pressure   13. Cognitive assessment:  No cognitive impairment   14. Screening: Patient provided with  a written and personalized 5-10 year screening schedule in the AVS.   yes   15. Provider List Update:   PCP, ophthalmology (unknown provider)  16. Advance Directives: Full code     Office Visit from 12/05/2019 in [place] at [facility]  PHQ-9 Total Score  0      Fall Risk  12/05/2019 08/30/2018  Falls in the past year? 0 0  Number falls in past yr: 0 0  Injury with Fall? 0 0     Impression and Plan:  Encounter for preventive health examination -He has routine eye care, have advised routine dental care. -Vaccines are up-to-date including Covid x2. -Screening labs today. -Healthy lifestyle has been discussed in detail. -He has decided to no longer pursue cancer screening due to age, I agree.  Vitamin D deficiency  - Plan: VITAMIN D 25 Hydroxy (Vit-D Deficiency, Fractures)  Dyslipidemia  - Plan: Lipid panel -His  last LDL was 101 in August 2020.  Essential hypertension -Very uncontrolled today at 200/110. -This has been confirmed on subsequent measurement. -He does not have chest pain, shortness of breath, headache, vision disturbances or focal neurologic weakness. -He is on Norvasc 10 mg and metoprolol 12.5 mg twice daily.  Increase metoprolol to 25 twice daily and follow-up in 2 weeks for blood pressure management.  Osteoarthritis, unspecified osteoarthritis type, unspecified site  - Plan: HYDROcodone-Acetaminophen 5-300 MG TABS -PDMP has been reviewed, no red flags, overdose risk score is 0. -Will allow 30 tablets of hydrocodone for acute pain management.     Patient Instructions  -Nice seeing you today!!  -Increase metoprolol to 25 mg twice daily.  -Come back in 2 weeks for follow up of your blood pressure.   Preventive Care 37 Years and Older, Male Preventive care refers to lifestyle choices and visits with your health care provider that can promote health and wellness. This includes:  A yearly physical exam. This is also called an annual well check.  Regular dental and eye exams.  Immunizations.  Screening for certain conditions.  Healthy lifestyle choices, such as diet and exercise. What can I expect for my preventive care visit? Physical exam Your health care provider will check:  Height and weight. These may be used to calculate body mass index (BMI), which is a measurement that tells if you are at a healthy weight.  Heart rate and blood pressure.  Your skin for abnormal spots. Counseling Your health care provider may ask you questions about:  Alcohol, tobacco, and drug use.  Emotional well-being.  Home and relationship well-being.  Sexual activity.  Eating habits.  History of falls.  Memory and ability to understand (cognition).  Work and work Statistician. What immunizations do I need?  Influenza (flu) vaccine  This is recommended every  year. Tetanus, diphtheria, and pertussis (Tdap) vaccine  You may need a Td booster every 10 years. Varicella (chickenpox) vaccine  You may need this vaccine if you have not already been vaccinated. Zoster (shingles) vaccine  You may need this after age 8. Pneumococcal conjugate (PCV13) vaccine  One dose is recommended after age 71. Pneumococcal polysaccharide (PPSV23) vaccine  One dose is recommended after age 47. Measles, mumps, and rubella (MMR) vaccine  You may need at least one dose of MMR if you were born in 1957 or later. You may also need a second dose. Meningococcal conjugate (MenACWY) vaccine  You may need this if you have certain conditions. Hepatitis A vaccine  You may need this if you have certain  conditions or if you travel or work in places where you may be exposed to hepatitis A. Hepatitis B vaccine  You may need this if you have certain conditions or if you travel or work in places where you may be exposed to hepatitis B. Haemophilus influenzae type b (Hib) vaccine  You may need this if you have certain conditions. You may receive vaccines as individual doses or as more than one vaccine together in one shot (combination vaccines). Talk with your health care provider about the risks and benefits of combination vaccines. What tests do I need? Blood tests  Lipid and cholesterol levels. These may be checked every 5 years, or more frequently depending on your overall health.  Hepatitis C test.  Hepatitis B test. Screening  Lung cancer screening. You may have this screening every year starting at age 59 if you have a 30-pack-year history of smoking and currently smoke or have quit within the past 15 years.  Colorectal cancer screening. All adults should have this screening starting at age 53 and continuing until age 97. Your health care provider may recommend screening at age 6 if you are at increased risk. You will have tests every 1-10 years, depending on  your results and the type of screening test.  Prostate cancer screening. Recommendations will vary depending on your family history and other risks.  Diabetes screening. This is done by checking your blood sugar (glucose) after you have not eaten for a while (fasting). You may have this done every 1-3 years.  Abdominal aortic aneurysm (AAA) screening. You may need this if you are a current or former smoker.  Sexually transmitted disease (STD) testing. Follow these instructions at home: Eating and drinking  Eat a diet that includes fresh fruits and vegetables, whole grains, lean protein, and low-fat dairy products. Limit your intake of foods with high amounts of sugar, saturated fats, and salt.  Take vitamin and mineral supplements as recommended by your health care provider.  Do not drink alcohol if your health care provider tells you not to drink.  If you drink alcohol: ? Limit how much you have to 0-2 drinks a day. ? Be aware of how much alcohol is in your drink. In the [place] one drink equals one 12 oz bottle of beer (355 mL), one 5 oz glass of wine (148 mL), or one 1 oz glass of hard liquor (44 mL). Lifestyle  Take daily care of your teeth and gums.  Stay active. Exercise for at least 30 minutes on 5 or more days each week.  Do not use any products that contain nicotine or tobacco, such as cigarettes, e-cigarettes, and chewing tobacco. If you need help quitting, ask your health care provider.  If you are sexually active, practice safe sex. Use a condom or other form of protection to prevent STIs (sexually transmitted infections).  Talk with your health care provider about taking a low-dose aspirin or statin. What's next?  Visit your health care provider once a year for a well check visit.  Ask your health care provider how often you should have your eyes and teeth checked.  Stay up to date on all vaccines. This information is not intended to replace advice given to you by  your health care provider. Make sure you discuss any questions you have with your health care provider. Document Revised: 07/18/2018 Document Reviewed: 07/18/2018 Elsevier Patient Education  2020 [place], MD [facility]

## 2019-12-09 ENCOUNTER — Encounter: Payer: Self-pay | Admitting: Internal Medicine

## 2019-12-09 ENCOUNTER — Other Ambulatory Visit: Payer: Self-pay | Admitting: Internal Medicine

## 2019-12-09 DIAGNOSIS — R7302 Impaired glucose tolerance (oral): Secondary | ICD-10-CM | POA: Insufficient documentation

## 2019-12-09 DIAGNOSIS — E559 Vitamin D deficiency, unspecified: Secondary | ICD-10-CM

## 2019-12-09 MED ORDER — VITAMIN D (ERGOCALCIFEROL) 1.25 MG (50000 UNIT) PO CAPS: 50000.0000 [IU] | ORAL_CAPSULE | ORAL | 0 refills | Status: DC

## 2019-12-19 ENCOUNTER — Other Ambulatory Visit: Payer: Self-pay

## 2019-12-19 ENCOUNTER — Encounter: Payer: Self-pay | Admitting: Internal Medicine

## 2019-12-19 ENCOUNTER — Ambulatory Visit (INDEPENDENT_AMBULATORY_CARE_PROVIDER_SITE_OTHER): Payer: Medicare Other | Admitting: Internal Medicine

## 2019-12-19 VITALS — BP 160/100 | HR 69 | Temp 97.2°F | Wt 177.2 lb

## 2019-12-19 DIAGNOSIS — I10 Essential (primary) hypertension: Secondary | ICD-10-CM

## 2019-12-19 NOTE — Progress Notes (Signed)
Established Patient Office Visit     This visit occurred during the SARS-CoV-2 public health emergency.  Safety protocols were in place, including screening questions prior to the visit, additional usage of staff PPE, and extensive cleaning of exam room while observing appropriate contact time as indicated for disinfecting solutions.    CC/Reason for Visit: BP follow up  HPI: Finus Yanke is a 86 y.o. male who is coming in today for the above mentioned reasons. Here for BP f/u. He was seen 2 weeks ago with a BP of 200/110. He is on norvasc 10 mg and we increased metoprolol from 12.5 to 25 mg BID. He has only been taking metoprolol once daily. He has no complaints today   Past Medical/Surgical History: Past Medical History:  Diagnosis Date  . ED (erectile dysfunction)   . Hyperlipidemia   . Hypertension   . Hypothyroidism   . Psoriasis   . Varicose veins   . Vitamin D deficiency     Past Surgical History:  Procedure Laterality Date  . JOINT REPLACEMENT    . joint replacement rt and left    . rotator cuff repair, right shoulder per Dr. Tonita Cong  1996  . torn right bicepts muscle  1996  . TOTAL HIP ARTHROPLASTY  01/17/10   redone on right hip on 09/05/10    Social History:  reports that he has quit smoking. He has never used smokeless tobacco. He reports that he does not drink alcohol or use drugs.  Allergies: No Known Allergies  Family History:  Family History  Problem Relation Age of Onset  . Breast cancer Other      Current Outpatient Medications:  .  amLODipine (NORVASC) 10 MG tablet, Take 1 tablet (10 mg total) by mouth daily., Disp: 30 tablet, Rfl: 0 .  aspirin 325 MG tablet, Take 325 mg by mouth 2 (two) times daily. Take 0.5 tablet once a day, Disp: , Rfl:  .  atorvastatin (LIPITOR) 80 MG tablet, TAKE 1 TABLET DAILY, Disp: 90 tablet, Rfl: 2 .  budesonide-formoterol (SYMBICORT) 80-4.5 MCG/ACT inhaler, Inhale 2 puffs into the lungs 2 (two) times daily.,  Disp: 1 Inhaler, Rfl: 3 .  clobetasol ointment (TEMOVATE) 0.05 %, APPLY TOPICALLY TWICE A DAY, Disp: 30 g, Rfl: 23 .  diclofenac sodium (VOLTAREN) 1 % GEL, APPLY 4 GRAMS TOPICALLY FOUR TIMES A DAY AS NEEDED FOR KNEE PAIN, Disp: 200 g, Rfl: 28 .  HYDROcodone-Acetaminophen 5-300 MG TABS, Take 1 tablet by mouth every 6 (six) hours as needed., Disp: 30 tablet, Rfl: 0 .  metoprolol tartrate (LOPRESSOR) 25 MG tablet, Take 1 tablet (25 mg total) by mouth 2 (two) times daily., Disp: 180 tablet, Rfl: 1 .  polyethylene glycol (MIRALAX / GLYCOLAX) 17 g packet, Take 17 g by mouth daily as needed for severe constipation., Disp: 14 each, Rfl: 0 .  sildenafil (VIAGRA) 100 MG tablet, TAKE 1 TABLET AS NEEDED ONE HOUR BEFORE NEEDED FOR ERECTILE DYSFUNCTION, Disp: 10 tablet, Rfl: 2 .  TRICOR 48 MG tablet, TAKE 1 TABLET DAILY, Disp: 90 tablet, Rfl: 3 .  Vitamin D, Ergocalciferol, (DRISDOL) 1.25 MG (50000 UNIT) CAPS capsule, Take 1 capsule (50,000 Units total) by mouth every 7 (seven) days for 12 doses., Disp: 12 capsule, Rfl: 0 .  omega-3 acid ethyl esters (LOVAZA) 1 G capsule, Take 2 capsules (2 g total) by mouth 2 (two) times daily., Disp: 120 capsule, Rfl: 11  Review of Systems:  Constitutional: Denies fever, chills, diaphoresis,  appetite change and fatigue.  HEENT: Denies photophobia, eye pain, redness, hearing loss, ear pain, congestion, sore throat, rhinorrhea, sneezing, mouth sores, trouble swallowing, neck pain, neck stiffness and tinnitus.   Respiratory: Denies SOB, DOE, cough, chest tightness,  and wheezing.   Cardiovascular: Denies chest pain, palpitations and leg swelling.  Gastrointestinal: Denies nausea, vomiting, abdominal pain, diarrhea, constipation, blood in stool and abdominal distention.  Genitourinary: Denies dysuria, urgency, frequency, hematuria, flank pain and difficulty urinating.  Endocrine: Denies: hot or cold intolerance, sweats, changes in hair or nails, polyuria,  polydipsia. Musculoskeletal: Denies myalgias, back pain, joint swelling, arthralgias and gait problem.  Skin: Denies pallor, rash and wound.  Neurological: Denies dizziness, seizures, syncope, weakness, light-headedness, numbness and headaches.  Hematological: Denies adenopathy. Easy bruising, personal or family bleeding history  Psychiatric/Behavioral: Denies suicidal ideation, mood changes, confusion, nervousness, sleep disturbance and agitation    Physical Exam: Vitals:   12/19/19 0950  BP: (!) 160/100  Pulse: 69  Temp: (!) 97.2 F (36.2 C)  TempSrc: Temporal  SpO2: 96%  Weight: 177 lb 3.2 oz (80.4 kg)    Body mass index is 27.34 kg/m.   Constitutional: NAD, calm, comfortable Eyes: PERRL, lids and conjunctivae normal ENMT: Mucous membranes are moist. Respiratory: clear to auscultation bilaterally, no wheezing, no crackles. Normal respiratory effort. No accessory muscle use.  Cardiovascular: Regular rate and rhythm, no murmurs / rubs / gallops. No extremity edema.Marland Kitchen  Psychiatric: Normal judgment and insight. Alert and oriented x 3. Normal mood.    Impression and Plan:  Essential hypertension -Not at goal altho improved from 2 weeks ago. -He has not been taking medications as prescribed. -He will start taking metoprolol 25 mg BID in addition to norvasc 10 and return in 6 weeks for BP check.   Patient Instructions  -Nice seeing you today!!  -Make sure you are taking the metoprolol twice daily.  -Schedule follow up in 6 weeks for your blood pressure.     Lelon Frohlich, MD [facility]

## 2019-12-19 NOTE — Patient Instructions (Signed)
-  Nice seeing you today!!  -Make sure you are taking the metoprolol twice daily.  -Schedule follow up in 6 weeks for your blood pressure.

## 2020-01-30 ENCOUNTER — Ambulatory Visit: Payer: Medicare Other | Admitting: Internal Medicine

## 2020-02-11 ENCOUNTER — Other Ambulatory Visit: Payer: Self-pay

## 2020-02-11 ENCOUNTER — Encounter: Payer: Self-pay | Admitting: Internal Medicine

## 2020-02-11 ENCOUNTER — Ambulatory Visit (INDEPENDENT_AMBULATORY_CARE_PROVIDER_SITE_OTHER): Payer: Medicare Other | Admitting: Internal Medicine

## 2020-02-11 VITALS — BP 140/90 | HR 71 | Temp 97.4°F | Ht 68.0 in | Wt 176.9 lb

## 2020-02-11 DIAGNOSIS — I10 Essential (primary) hypertension: Secondary | ICD-10-CM | POA: Diagnosis not present

## 2020-02-11 MED ORDER — METOPROLOL TARTRATE 50 MG PO TABS: 50.0000 mg | ORAL_TABLET | Freq: Two times a day (BID) | ORAL | 1 refills | Status: DC

## 2020-02-11 NOTE — Patient Instructions (Signed)
-  Nice seeing you today!!  -increase metoprolol to 50 mg twice a day.  -Schedule 6 week follow up for your blood pressure.

## 2020-02-11 NOTE — Progress Notes (Signed)
Established Patient Office Visit     This visit occurred during the SARS-CoV-2 public health emergency.  Safety protocols were in place, including screening questions prior to the visit, additional usage of staff PPE, and extensive cleaning of exam room while observing appropriate contact time as indicated for disinfecting solutions.    CC/Reason for Visit: Blood pressure follow-up  HPI: Brendan Long is a 86 y.o. male who is coming in today for the above mentioned reasons.  He is on Norvasc 10 mg and metoprolol 25 mg twice daily.  We have been seeing him the last few months frequently because of elevated blood pressures and we have been adjusting his regimen.  At last visit he had told us that he had only been taking the metoprolol once a day.  He was encouraged to take it twice a day and returns today for follow-up.  Blood pressure in office today is 140/90 consistent with his home measurements.  He is trying to eat low in salt.  Other than this he has no complaints.  Past Medical/Surgical History: Past Medical History:  Diagnosis Date  . ED (erectile dysfunction)   . Hyperlipidemia   . Hypertension   . Hypothyroidism   . Psoriasis   . Varicose veins   . Vitamin D deficiency     Past Surgical History:  Procedure Laterality Date  . JOINT REPLACEMENT    . joint replacement rt and left    . rotator cuff repair, right shoulder per Dr. Tonita Cong  1996  . torn right bicepts muscle  1996  . TOTAL HIP ARTHROPLASTY  01/17/10   redone on right hip on 09/05/10    Social History:  reports that he has quit smoking. He has never used smokeless tobacco. He reports that he does not drink alcohol and does not use drugs.  Allergies: No Known Allergies  Family History:  Family History  Problem Relation Age of Onset  . Breast cancer Other      Current Outpatient Medications:  .  amLODipine (NORVASC) 10 MG tablet, Take 1 tablet (10 mg total) by mouth daily., Disp: 30 tablet, Rfl:  0 .  aspirin 325 MG tablet, Take 325 mg by mouth 2 (two) times daily. Take 0.5 tablet once a day, Disp: , Rfl:  .  atorvastatin (LIPITOR) 80 MG tablet, TAKE 1 TABLET DAILY, Disp: 90 tablet, Rfl: 2 .  budesonide-formoterol (SYMBICORT) 80-4.5 MCG/ACT inhaler, Inhale 2 puffs into the lungs 2 (two) times daily., Disp: 1 Inhaler, Rfl: 3 .  clobetasol ointment (TEMOVATE) 0.05 %, APPLY TOPICALLY TWICE A DAY, Disp: 30 g, Rfl: 23 .  diclofenac sodium (VOLTAREN) 1 % GEL, APPLY 4 GRAMS TOPICALLY FOUR TIMES A DAY AS NEEDED FOR KNEE PAIN, Disp: 200 g, Rfl: 28 .  HYDROcodone-Acetaminophen 5-300 MG TABS, Take 1 tablet by mouth every 6 (six) hours as needed., Disp: 30 tablet, Rfl: 0 .  metoprolol tartrate (LOPRESSOR) 50 MG tablet, Take 1 tablet (50 mg total) by mouth 2 (two) times daily., Disp: 180 tablet, Rfl: 1 .  polyethylene glycol (MIRALAX / GLYCOLAX) 17 g packet, Take 17 g by mouth daily as needed for severe constipation., Disp: 14 each, Rfl: 0 .  sildenafil (VIAGRA) 100 MG tablet, TAKE 1 TABLET AS NEEDED ONE HOUR BEFORE NEEDED FOR ERECTILE DYSFUNCTION, Disp: 10 tablet, Rfl: 2 .  TRICOR 48 MG tablet, TAKE 1 TABLET DAILY, Disp: 90 tablet, Rfl: 3 .  Vitamin D, Ergocalciferol, (DRISDOL) 1.25 MG (50000 UNIT) CAPS capsule,  Take 1 capsule (50,000 Units total) by mouth every 7 (seven) days for 12 doses., Disp: 12 capsule, Rfl: 0 .  omega-3 acid ethyl esters (LOVAZA) 1 G capsule, Take 2 capsules (2 g total) by mouth 2 (two) times daily., Disp: 120 capsule, Rfl: 11  Review of Systems:  Constitutional: Denies fever, chills, diaphoresis, appetite change and fatigue.  HEENT: Denies photophobia, eye pain, redness, hearing loss, ear pain, congestion, sore throat, rhinorrhea, sneezing, mouth sores, trouble swallowing, neck pain, neck stiffness and tinnitus.   Respiratory: Denies SOB, DOE, cough, chest tightness,  and wheezing.   Cardiovascular: Denies chest pain, palpitations and leg swelling.  Gastrointestinal: Denies  nausea, vomiting, abdominal pain, diarrhea, constipation, blood in stool and abdominal distention.  Genitourinary: Denies dysuria, urgency, frequency, hematuria, flank pain and difficulty urinating.  Endocrine: Denies: hot or cold intolerance, sweats, changes in hair or nails, polyuria, polydipsia. Musculoskeletal: Denies myalgias, back pain, joint swelling, arthralgias and gait problem.  Skin: Denies pallor, rash and wound.  Neurological: Denies dizziness, seizures, syncope, weakness, light-headedness, numbness and headaches.  Hematological: Denies adenopathy. Easy bruising, personal or family bleeding history  Psychiatric/Behavioral: Denies suicidal ideation, mood changes, confusion, nervousness, sleep disturbance and agitation    Physical Exam: Vitals:   02/11/20 0835  BP: 140/90  Pulse: 71  Temp: (!) 97.4 F (36.3 C)  TempSrc: Temporal  SpO2: 96%  Weight: 176 lb 14.4 oz (80.2 kg)  Height: 5\' 8"  (1.727 m)    Body mass index is 26.9 kg/m.   Constitutional: NAD, calm, comfortable Eyes: PERRL, lids and conjunctivae normal ENMT: Mucous membranes are moist.  Respiratory: clear to auscultation bilaterally, no wheezing, no crackles. Normal respiratory effort. No accessory muscle use.  Cardiovascular: Regular rate and rhythm, no murmurs / rubs / gallops. No extremity edema. Neurologic: Grossly intact and nonfocal. Psychiatric: Normal judgment and insight. Alert and oriented x 3. Normal mood.    Impression and Plan:  Essential hypertension -Improved but still not at goal. -Increase metoprolol from 25 to 50 mg twice daily, continue Norvasc 10 mg daily. -Return in 6 weeks for follow-up.    Patient Instructions  -Nice seeing you today!!  -increase metoprolol to 50 mg twice a day.  -Schedule 6 week follow up for your blood pressure.     Lelon Frohlich, MD [facility]

## 2020-02-13 ENCOUNTER — Other Ambulatory Visit: Payer: Self-pay | Admitting: Internal Medicine

## 2020-02-13 DIAGNOSIS — E559 Vitamin D deficiency, unspecified: Secondary | ICD-10-CM

## 2020-04-19 ENCOUNTER — Other Ambulatory Visit: Payer: Self-pay | Admitting: Internal Medicine

## 2020-04-19 DIAGNOSIS — E559 Vitamin D deficiency, unspecified: Secondary | ICD-10-CM

## 2020-04-20 ENCOUNTER — Other Ambulatory Visit: Payer: Self-pay | Admitting: Internal Medicine

## 2020-04-20 DIAGNOSIS — E559 Vitamin D deficiency, unspecified: Secondary | ICD-10-CM

## 2020-05-06 ENCOUNTER — Inpatient Hospital Stay (HOSPITAL_COMMUNITY)
Admission: EM | Admit: 2020-05-06 | Discharge: 2020-05-08 | DRG: 065 | Disposition: A | Discharge status: Home / Self Care | Payer: Medicare Other | Attending: Internal Medicine | Admitting: Internal Medicine

## 2020-05-06 ENCOUNTER — Inpatient Hospital Stay (HOSPITAL_COMMUNITY): Payer: Medicare Other

## 2020-05-06 ENCOUNTER — Encounter (HOSPITAL_COMMUNITY): Payer: Self-pay

## 2020-05-06 ENCOUNTER — Other Ambulatory Visit: Payer: Self-pay

## 2020-05-06 DIAGNOSIS — I639 Cerebral infarction, unspecified: Secondary | ICD-10-CM

## 2020-05-06 DIAGNOSIS — E1122 Type 2 diabetes mellitus with diabetic chronic kidney disease: Secondary | ICD-10-CM | POA: Diagnosis present

## 2020-05-06 DIAGNOSIS — I10 Essential (primary) hypertension: Secondary | ICD-10-CM | POA: Diagnosis present

## 2020-05-06 DIAGNOSIS — Z8673 Personal history of transient ischemic attack (TIA), and cerebral infarction without residual deficits: Secondary | ICD-10-CM | POA: Diagnosis not present

## 2020-05-06 DIAGNOSIS — E1121 Type 2 diabetes mellitus with diabetic nephropathy: Secondary | ICD-10-CM

## 2020-05-06 DIAGNOSIS — I4510 Unspecified right bundle-branch block: Secondary | ICD-10-CM | POA: Diagnosis present

## 2020-05-06 DIAGNOSIS — E039 Hypothyroidism, unspecified: Secondary | ICD-10-CM | POA: Diagnosis present

## 2020-05-06 DIAGNOSIS — Z20822 Contact with and (suspected) exposure to covid-19: Secondary | ICD-10-CM | POA: Diagnosis present

## 2020-05-06 DIAGNOSIS — H547 Unspecified visual loss: Secondary | ICD-10-CM | POA: Diagnosis present

## 2020-05-06 DIAGNOSIS — Z79899 Other long term (current) drug therapy: Secondary | ICD-10-CM

## 2020-05-06 DIAGNOSIS — I6623 Occlusion and stenosis of bilateral posterior cerebral arteries: Secondary | ICD-10-CM | POA: Diagnosis not present

## 2020-05-06 DIAGNOSIS — N529 Male erectile dysfunction, unspecified: Secondary | ICD-10-CM | POA: Diagnosis present

## 2020-05-06 DIAGNOSIS — I6340 Cerebral infarction due to embolism of unspecified cerebral artery: Principal | ICD-10-CM | POA: Diagnosis present

## 2020-05-06 DIAGNOSIS — Z87891 Personal history of nicotine dependence: Secondary | ICD-10-CM | POA: Diagnosis not present

## 2020-05-06 DIAGNOSIS — I63412 Cerebral infarction due to embolism of left middle cerebral artery: Secondary | ICD-10-CM | POA: Diagnosis not present

## 2020-05-06 DIAGNOSIS — Z7982 Long term (current) use of aspirin: Secondary | ICD-10-CM | POA: Diagnosis not present

## 2020-05-06 DIAGNOSIS — R931 Abnormal findings on diagnostic imaging of heart and coronary circulation: Secondary | ICD-10-CM | POA: Diagnosis present

## 2020-05-06 DIAGNOSIS — L409 Psoriasis, unspecified: Secondary | ICD-10-CM | POA: Diagnosis present

## 2020-05-06 DIAGNOSIS — R29700 NIHSS score 0: Secondary | ICD-10-CM | POA: Diagnosis present

## 2020-05-06 DIAGNOSIS — I6389 Other cerebral infarction: Secondary | ICD-10-CM | POA: Diagnosis not present

## 2020-05-06 DIAGNOSIS — I471 Supraventricular tachycardia: Secondary | ICD-10-CM | POA: Diagnosis present

## 2020-05-06 DIAGNOSIS — I129 Hypertensive chronic kidney disease with stage 1 through stage 4 chronic kidney disease, or unspecified chronic kidney disease: Secondary | ICD-10-CM | POA: Diagnosis present

## 2020-05-06 DIAGNOSIS — J449 Chronic obstructive pulmonary disease, unspecified: Secondary | ICD-10-CM | POA: Diagnosis present

## 2020-05-06 DIAGNOSIS — N179 Acute kidney failure, unspecified: Secondary | ICD-10-CM | POA: Diagnosis present

## 2020-05-06 DIAGNOSIS — I6611 Occlusion and stenosis of right anterior cerebral artery: Secondary | ICD-10-CM | POA: Diagnosis not present

## 2020-05-06 DIAGNOSIS — I480 Paroxysmal atrial fibrillation: Secondary | ICD-10-CM | POA: Diagnosis present

## 2020-05-06 DIAGNOSIS — I6601 Occlusion and stenosis of right middle cerebral artery: Secondary | ICD-10-CM | POA: Diagnosis not present

## 2020-05-06 DIAGNOSIS — N1832 Chronic kidney disease, stage 3b: Secondary | ICD-10-CM | POA: Diagnosis present

## 2020-05-06 DIAGNOSIS — Z96641 Presence of right artificial hip joint: Secondary | ICD-10-CM | POA: Diagnosis present

## 2020-05-06 DIAGNOSIS — N183 Chronic kidney disease, stage 3 unspecified: Secondary | ICD-10-CM | POA: Diagnosis present

## 2020-05-06 DIAGNOSIS — E785 Hyperlipidemia, unspecified: Secondary | ICD-10-CM | POA: Diagnosis present

## 2020-05-06 DIAGNOSIS — J90 Pleural effusion, not elsewhere classified: Secondary | ICD-10-CM | POA: Diagnosis not present

## 2020-05-06 DIAGNOSIS — E1129 Type 2 diabetes mellitus with other diabetic kidney complication: Secondary | ICD-10-CM | POA: Diagnosis present

## 2020-05-06 LAB — CBC WITH DIFFERENTIAL/PLATELET
Abs Immature Granulocytes: 0.03 10*3/uL (ref 0.00–0.07)
Basophils Absolute: 0.1 10*3/uL (ref 0.0–0.1)
Basophils Relative: 1 %
Eosinophils Absolute: 0.8 10*3/uL — ABNORMAL HIGH (ref 0.0–0.5)
Eosinophils Relative: 8 %
HCT: 46.6 % (ref 39.0–52.0)
Hemoglobin: 15.9 g/dL (ref 13.0–17.0)
Immature Granulocytes: 0 %
Lymphocytes Relative: 32 %
Lymphs Abs: 3.2 10*3/uL (ref 0.7–4.0)
MCH: 32 pg (ref 26.0–34.0)
MCHC: 34.1 g/dL (ref 30.0–36.0)
MCV: 93.8 fL (ref 80.0–100.0)
Monocytes Absolute: 0.7 10*3/uL (ref 0.1–1.0)
Monocytes Relative: 7 %
Neutro Abs: 5.1 10*3/uL (ref 1.7–7.7)
Neutrophils Relative %: 52 %
Platelets: 227 10*3/uL (ref 150–400)
RBC: 4.97 MIL/uL (ref 4.22–5.81)
RDW: 13.8 % (ref 11.5–15.5)
WBC: 9.9 10*3/uL (ref 4.0–10.5)
nRBC: 0 % (ref 0.0–0.2)

## 2020-05-06 LAB — PROTIME-INR
INR: 1 (ref 0.8–1.2)
Prothrombin Time: 12.5 s (ref 11.4–15.2)

## 2020-05-06 LAB — RESPIRATORY PANEL BY RT PCR (FLU A&B, COVID)
Influenza A by PCR: NEGATIVE
Influenza B by PCR: NEGATIVE
SARS Coronavirus 2 by RT PCR: NEGATIVE

## 2020-05-06 LAB — BASIC METABOLIC PANEL WITH GFR
Anion gap: 8 (ref 5–15)
BUN: 38 mg/dL — ABNORMAL HIGH (ref 8–23)
CO2: 28 mmol/L (ref 22–32)
Calcium: 9.6 mg/dL (ref 8.9–10.3)
Chloride: 101 mmol/L (ref 98–111)
Creatinine, Ser: 1.97 mg/dL — ABNORMAL HIGH (ref 0.61–1.24)
GFR calc Af Amer: 34 mL/min — ABNORMAL LOW
GFR calc non Af Amer: 29 mL/min — ABNORMAL LOW
Glucose, Bld: 79 mg/dL (ref 70–99)
Potassium: 3.7 mmol/L (ref 3.5–5.1)
Sodium: 137 mmol/L (ref 135–145)

## 2020-05-06 LAB — URINALYSIS, COMPLETE (UACMP) WITH MICROSCOPIC
Bacteria, UA: NONE SEEN
Bilirubin Urine: NEGATIVE
Glucose, UA: NEGATIVE mg/dL
Hgb urine dipstick: NEGATIVE
Ketones, ur: NEGATIVE mg/dL
Leukocytes,Ua: NEGATIVE
Nitrite: NEGATIVE
Protein, ur: 30 mg/dL — AB
Specific Gravity, Urine: 1.019 (ref 1.005–1.030)
pH: 6 (ref 5.0–8.0)

## 2020-05-06 LAB — MAGNESIUM: Magnesium: 2.2 mg/dL (ref 1.7–2.4)

## 2020-05-06 LAB — TROPONIN I (HIGH SENSITIVITY)
Troponin I (High Sensitivity): 8 ng/L (ref <18)
Troponin I (High Sensitivity): 9 ng/L

## 2020-05-06 IMAGING — CR DG CHEST 2V
2 series · 2 of 2 positions shown · non-contrast
Comparison: [DATE]

CLINICAL DATA: Stroke

EXAM:
CHEST - 2 VIEW

[w chest lat]
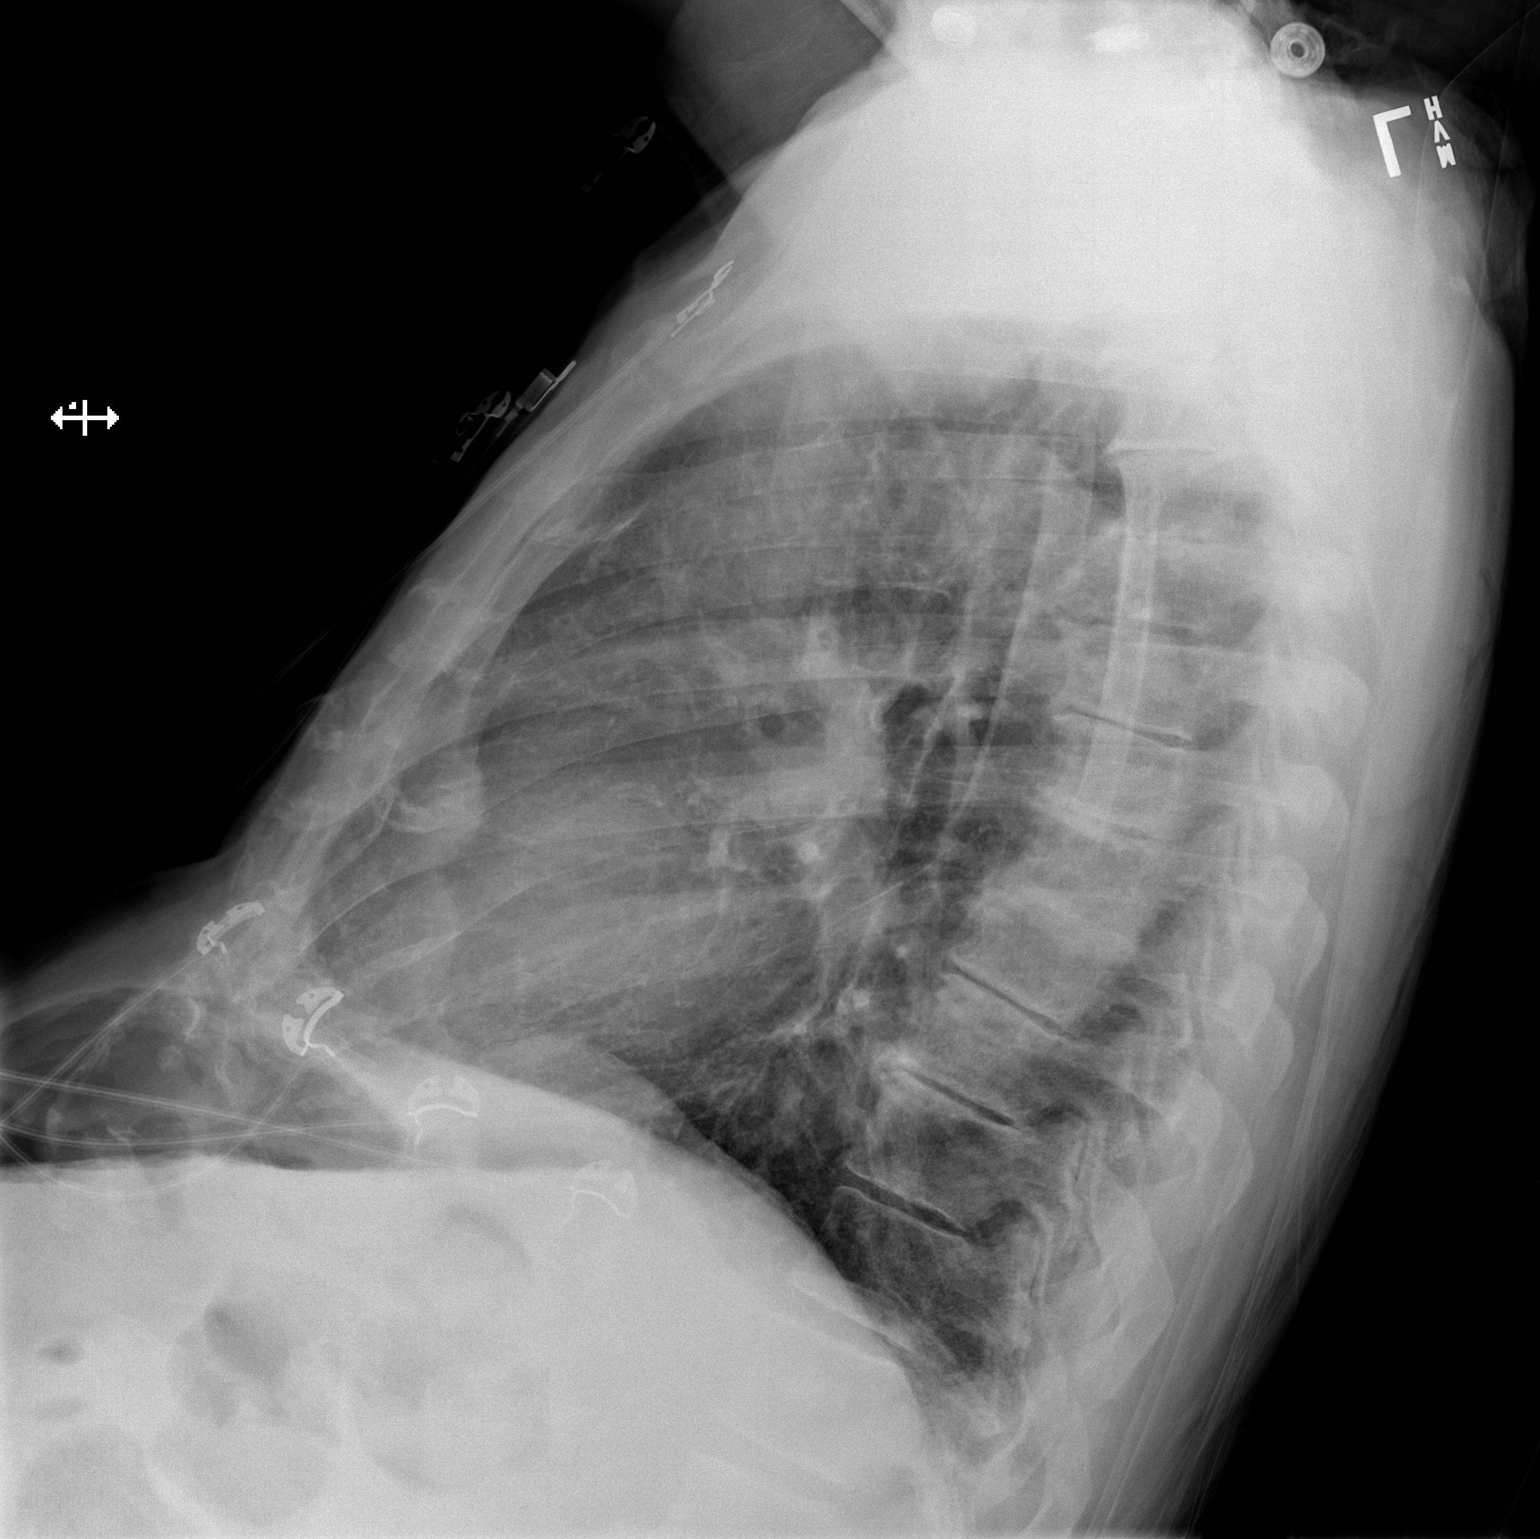

[x chest ap]
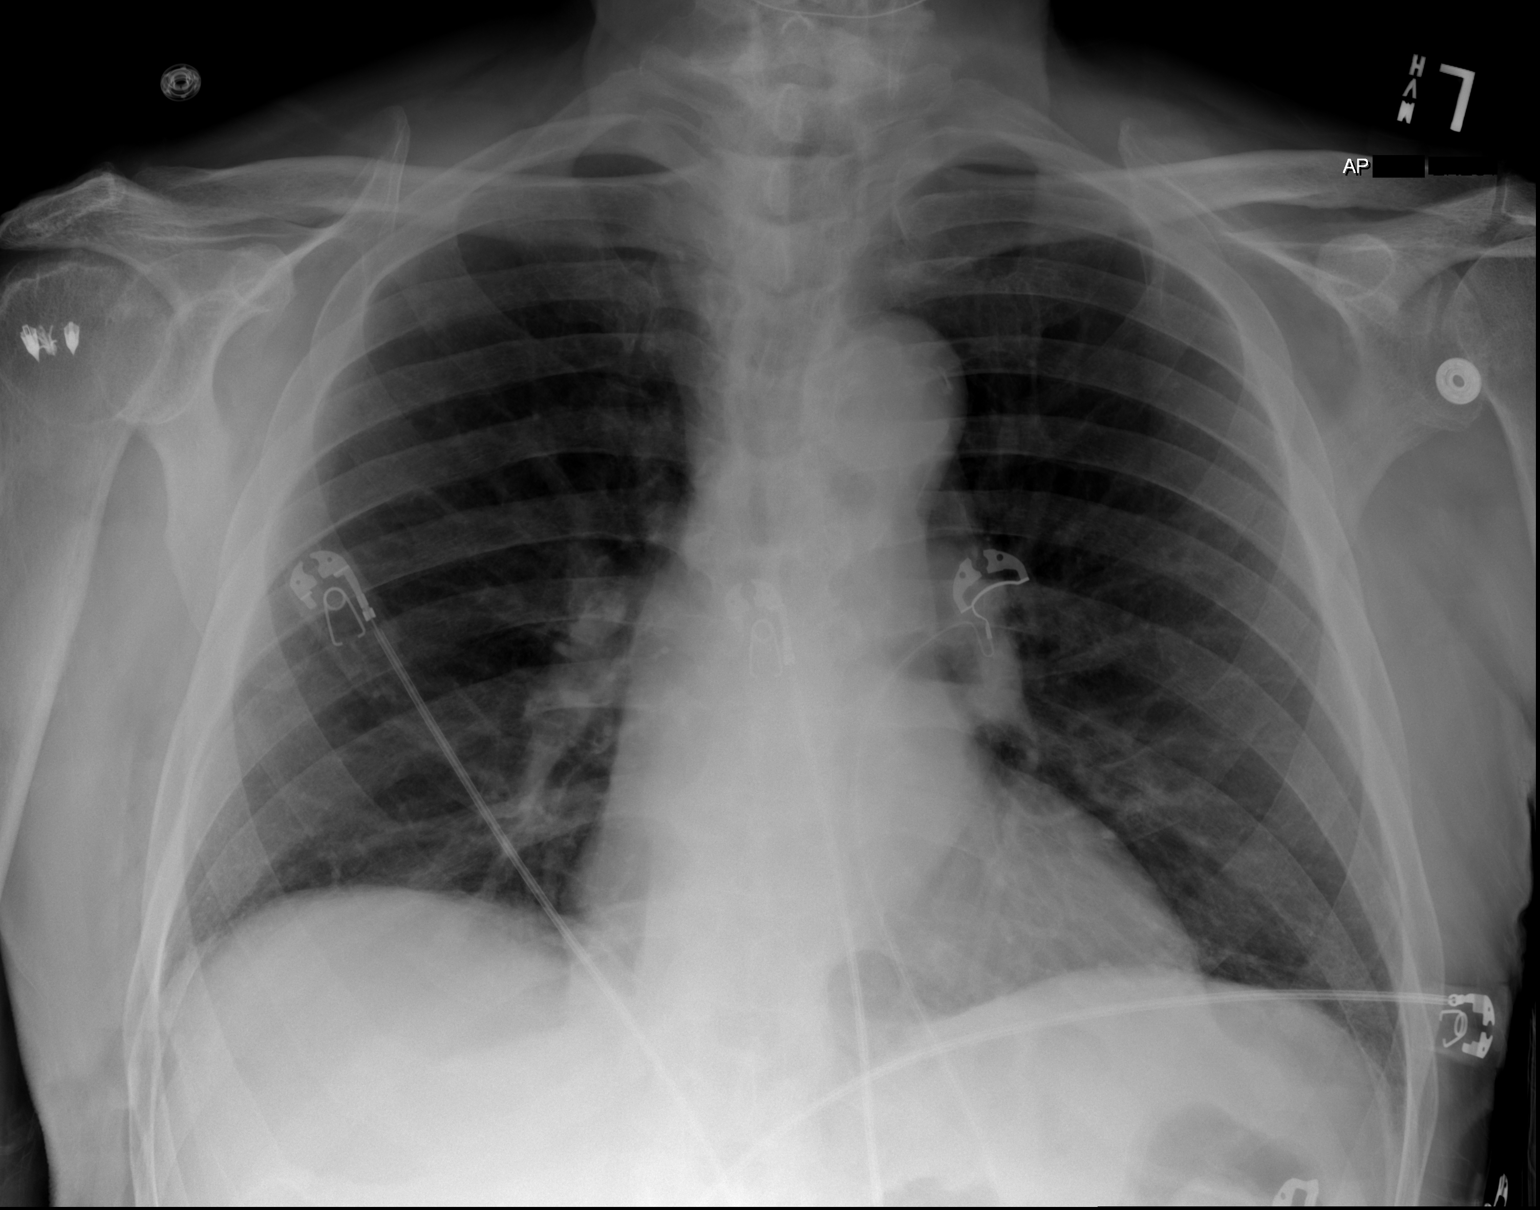

[2 of 2 positions shown; findings below may reference images not displayed]

FINDINGS: No focal opacity or pleural effusion. Stable cardiomediastinal
silhouette with aortic atherosclerosis. No pneumothorax.
Postsurgical changes of the right humeral head
IMPRESSION: No active cardiopulmonary disease.

## 2020-05-06 MED ORDER — ASPIRIN 325 MG PO TABS
325.0000 mg | ORAL_TABLET | Freq: Every day | ORAL | Status: DC
  Administered 2020-05-06 – 2020-05-07 (×2): 325 mg via ORAL
  Filled 2020-05-06 (×2): qty 1

## 2020-05-06 MED ORDER — STROKE: EARLY STAGES OF RECOVERY BOOK
Freq: Once | Status: DC
  Filled 2020-05-06: qty 1

## 2020-05-06 MED ORDER — FENOFIBRATE 54 MG PO TABS
54.0000 mg | ORAL_TABLET | Freq: Every day | ORAL | Status: DC
  Administered 2020-05-07 – 2020-05-08 (×2): 54 mg via ORAL
  Filled 2020-05-06 (×2): qty 1

## 2020-05-06 MED ORDER — SODIUM CHLORIDE 0.9 % IV SOLN: INTRAVENOUS | Status: DC

## 2020-05-06 MED ORDER — ATORVASTATIN CALCIUM 80 MG PO TABS
80.0000 mg | ORAL_TABLET | Freq: Every day | ORAL | Status: DC
  Administered 2020-05-06 – 2020-05-08 (×3): 80 mg via ORAL
  Filled 2020-05-06 (×2): qty 2
  Filled 2020-05-06: qty 1

## 2020-05-06 MED ORDER — ACETAMINOPHEN 325 MG PO TABS: 650.0000 mg | ORAL_TABLET | ORAL | Status: DC | PRN

## 2020-05-06 MED ORDER — ACETAMINOPHEN 650 MG RE SUPP: 650.0000 mg | RECTAL | Status: DC | PRN

## 2020-05-06 MED ORDER — ASPIRIN 300 MG RE SUPP: 300.0000 mg | Freq: Every day | RECTAL | Status: DC

## 2020-05-06 MED ORDER — ACETAMINOPHEN 160 MG/5ML PO SOLN: 650.0000 mg | ORAL | Status: DC | PRN

## 2020-05-06 NOTE — ED Triage Notes (Addendum)
Patient states he went to the [place] today for a regular check up. Patient states [facility] had an [facility] and [facility] because he was having a stroke. Patient denies any weakness, numbness. No slurred speech. patient states he got dizzy when he was walking to this ED and early this AM.

## 2020-05-06 NOTE — ED Notes (Signed)
EDP at bedside  

## 2020-05-06 NOTE — ED Provider Notes (Signed)
[facility] DEPT Provider Note   CSN: [id number] Arrival date & time: 05/06/20  1629     History Chief Complaint  Patient presents with  . possible stroke    Brendan Long is a 86 y.o. male.  He is a history of hypertension hyperlipidemia.  He said he had a outpatient MRI of his brain today and they called him back after he went home and told him to go to the emergency department because he was possibly having a stroke.  He said he said problems with his neck for 6 months and that it pops and cracks sometimes.  Its not associated with any numbness or weakness.  Denies any slurred speech blurry vision double vision.  He said he gets dizzy from time to time.  No falls.  The history is provided by the patient.  Cerebrovascular Accident This is a new problem. The problem has not changed since onset.Pertinent negatives include no chest pain, no abdominal pain, no headaches and no shortness of breath. Nothing aggravates the symptoms. Nothing relieves the symptoms. He has tried nothing for the symptoms. The treatment provided no relief.       Past Medical History:  Diagnosis Date  . ED (erectile dysfunction)   . Hyperlipidemia   . Hypertension   . Hypothyroidism   . Psoriasis   . Varicose veins   . Vitamin D deficiency     Patient Active Problem List   Diagnosis Date Noted  . IGT (impaired glucose tolerance) 12/09/2019  . Dizziness 03/18/2019  . Neck swelling 07/26/2018  . Elevated PSA 12/16/2014  . Hypokalemia 12/16/2014  . Hyperglycemia 12/15/2013  . Renal insufficiency 12/15/2013  . Dyspnea 12/15/2013  . Hand pain, right 12/15/2013  . Screening for cancer 12/05/2012  . Encounter for long-term (current) use of other medications 12/05/2011  . Wellness examination 12/05/2011  . Osteoarthritis 12/05/2011  . Cough 12/05/2011  . ERECTILE DYSFUNCTION, NON-ORGANIC 11/09/2009  . Vitamin D deficiency 08/13/2008  . VARICOSE VEINS, LOWER  EXTREMITIES 08/13/2008  . UNS ADVRS EFF OTH RX MEDICINAL&BIOLOGICAL SBSTNC 08/13/2008  . Hypothyroidism 05/02/2007  . PSORIASIS 05/02/2007  . Dyslipidemia 03/12/2007  . Essential hypertension 03/12/2007    Past Surgical History:  Procedure Laterality Date  . JOINT REPLACEMENT    . joint replacement rt and left    . rotator cuff repair, right shoulder per Dr. Tonita Cong  1996  . torn right bicepts muscle  1996  . TOTAL HIP ARTHROPLASTY  01/17/10   redone on right hip on 09/05/10       Family History  Problem Relation Age of Onset  . Breast cancer Other     Social History   Tobacco Use  . Smoking status: Former Research scientist (life sciences)  . Smokeless tobacco: Never Used  Vaping Use  . Vaping Use: Never used  Substance Use Topics  . Alcohol use: No  . Drug use: No    Home Medications Prior to Admission medications   Medication Sig Start Date End Date Taking? Authorizing Provider  amLODipine (NORVASC) 10 MG tablet Take 1 tablet (10 mg total) by mouth daily. 03/22/19   Monica Becton, MD  aspirin 325 MG tablet Take 325 mg by mouth 2 (two) times daily. Take 0.5 tablet once a day    [provider]  atorvastatin (LIPITOR) 80 MG tablet TAKE 1 TABLET DAILY 07/24/17   Renato Shin, MD  budesonide-formoterol [facility]) 80-4.5 MCG/ACT inhaler Inhale 2 puffs into the lungs 2 (two) times daily. 12/03/17  Renato Shin, MD  clobetasol ointment (TEMOVATE) 0.05 % APPLY TOPICALLY TWICE A DAY 10/21/18   Isaac Bliss, Rayford Halsted, MD  diclofenac sodium (VOLTAREN) 1 % GEL APPLY 4 GRAMS TOPICALLY FOUR TIMES A DAY AS NEEDED FOR KNEE PAIN 04/22/18   Renato Shin, MD  HYDROcodone-Acetaminophen 5-300 MG TABS Take 1 tablet by mouth every 6 (six) hours as needed. 12/05/19   Isaac Bliss, Rayford Halsted, MD  metoprolol tartrate (LOPRESSOR) 50 MG tablet Take 1 tablet (50 mg total) by mouth 2 (two) times daily. 02/11/20   Isaac Bliss, Rayford Halsted, MD  omega-3 acid ethyl esters (LOVAZA) 1 G capsule Take 2 capsules  (2 g total) by mouth 2 (two) times daily. 03/11/13 03/11/14  Renato Shin, MD  polyethylene glycol (MIRALAX / GLYCOLAX) 17 g packet Take 17 g by mouth daily as needed for severe constipation. 03/21/19   Monica Becton, MD  sildenafil (VIAGRA) 100 MG tablet TAKE 1 TABLET AS NEEDED ONE HOUR BEFORE NEEDED FOR ERECTILE DYSFUNCTION 09/23/19   Isaac Bliss, Rayford Halsted, MD  TRICOR 48 MG tablet TAKE 1 TABLET DAILY 07/15/19   Isaac Bliss, Rayford Halsted, MD  Vitamin D, Ergocalciferol, (DRISDOL) 1.25 MG (50000 UNIT) CAPS capsule TAKE 1 CAPSULE EVERY 7 DAYS FOR 12 DOSES 02/13/20   Isaac Bliss, Rayford Halsted, MD    Allergies    Patient has no known allergies.  Review of Systems   Review of Systems  Constitutional: Negative for fever.  HENT: Negative for sore throat.   Eyes: Negative for visual disturbance.  Respiratory: Negative for shortness of breath.   Cardiovascular: Negative for chest pain.  Gastrointestinal: Negative for abdominal pain.  Genitourinary: Negative for dysuria.  Musculoskeletal: Positive for neck pain.  Skin: Negative for rash.  Neurological: Negative for headaches.    Physical Exam Updated Vital Signs BP (!) 153/95 (BP Location: Left Arm)   Pulse 62   Temp 98.1 F (36.7 C) (Oral)   Resp 13   Ht 5\' 10"  (1.778 m)   Wt 79.8 kg   SpO2 99%   BMI 25.25 kg/m   Physical Exam Vitals and nursing note reviewed.  Constitutional:      Appearance: Normal appearance. He is well-developed.  HENT:     Head: Normocephalic and atraumatic.  Eyes:     Conjunctiva/sclera: Conjunctivae normal.  Cardiovascular:     Rate and Rhythm: Normal rate and regular rhythm.     Heart sounds: No murmur heard.   Pulmonary:     Effort: Pulmonary effort is normal. No respiratory distress.     Breath sounds: Normal breath sounds.  Abdominal:     Palpations: Abdomen is soft.     Tenderness: There is no abdominal tenderness.  Musculoskeletal:        General: No deformity or signs of injury. Normal  range of motion.     Cervical back: Neck supple.  Skin:    General: Skin is warm and dry.     Capillary Refill: Capillary refill takes less than 2 seconds.  Neurological:     Mental Status: He is alert and oriented to person, place, and time.     Cranial Nerves: Cranial nerve deficit (left visual field cut) present.     Sensory: No sensory deficit.     Motor: No weakness.     Gait: Gait normal.     ED Results / Procedures / Treatments   Labs (all labs ordered are listed, but only abnormal results are displayed) Labs Reviewed  BASIC METABOLIC  PANEL - Abnormal; Notable for the following components:      Result Value   BUN 38 (*)    Creatinine, Ser 1.97 (*)    GFR calc non Af Amer 29 (*)    GFR calc Af Amer 34 (*)    All other components within normal limits  CBC WITH DIFFERENTIAL/PLATELET - Abnormal; Notable for the following components:   Eosinophils Absolute 0.8 (*)    All other components within normal limits  HEMOGLOBIN A1C - Abnormal; Notable for the following components:   Hgb A1c MFr Bld 6.0 (*)    All other components within normal limits  LIPID PANEL - Abnormal; Notable for the following components:   Triglycerides 182 (*)    HDL 26 (*)    LDL Cholesterol 132 (*)    All other components within normal limits  URINALYSIS, COMPLETE (UACMP) WITH MICROSCOPIC - Abnormal; Notable for the following components:   Protein, ur 30 (*)    All other components within normal limits  BASIC METABOLIC PANEL - Abnormal; Notable for the following components:   BUN 30 (*)    Creatinine, Ser 1.78 (*)    GFR calc non Af Amer 33 (*)    GFR calc Af Amer 38 (*)    All other components within normal limits  CBC WITH DIFFERENTIAL/PLATELET - Abnormal; Notable for the following components:   Eosinophils Absolute 0.7 (*)    All other components within normal limits  CBG MONITORING, ED - Abnormal; Notable for the following components:   Glucose-Capillary 128 (*)    All other components  within normal limits  RESPIRATORY PANEL BY RT PCR (FLU A&B, COVID)  MAGNESIUM  PROTIME-INR  CREATININE, URINE, RANDOM  SODIUM, URINE, RANDOM  MAGNESIUM  CBG MONITORING, ED  CBG MONITORING, ED  TROPONIN I (HIGH SENSITIVITY)  TROPONIN I (HIGH SENSITIVITY)    EKG EKG Interpretation  Date/Time:  Thursday May 06 2020 16:36:43 EDT Ventricular Rate:  61 PR Interval:    QRS Duration: 142 QT Interval:  439 QTC Calculation: 443 R Axis:   -167 Text Interpretation: Sinus or ectopic atrial rhythm Prolonged PR interval IVCD, consider atypical RBBB Probable lateral infarct, age indeterminate 67 Lead; Mason-Likar new lateral t wave inversions since prior 12/06 Confirmed by Aletta Edouard [phone]) on 05/06/2020 5:03:08 PM   Radiology DG Chest 2 View  Result Date: 05/06/2020 CLINICAL DATA:  Stroke EXAM: CHEST - 2 VIEW COMPARISON:  11/21/2017 FINDINGS: No focal opacity or pleural effusion. Stable cardiomediastinal silhouette with aortic atherosclerosis. No pneumothorax. Postsurgical changes of the right humeral head IMPRESSION: No active cardiopulmonary disease. Electronically Signed   By: Donavan Foil M.D.   On: 05/06/2020 20:36   VAS US CAROTID (at [facility] and [facility] only)  Result Date: 05/07/2020 Carotid Arterial Duplex Study Indications:       CVA. Risk Factors:      Hypertension. Comparison Study:  No prior studies. Performing Technologist: Oliver Hum RVT  Examination Guidelines: A complete evaluation includes B-mode imaging, spectral Doppler, color Doppler, and power Doppler as needed of all accessible portions of each vessel. Bilateral testing is considered an integral part of a complete examination. Limited examinations for reoccurring indications may be performed as noted.  Right Carotid Findings: +----------+--------+--------+--------+-----------------------+--------+           PSV cm/sEDV cm/sStenosisPlaque Description     Comments  +----------+--------+--------+--------+-----------------------+--------+ CCA Prox  48      11  tortuous +----------+--------+--------+--------+-----------------------+--------+ CCA Distal44      7               smooth and heterogenous         +----------+--------+--------+--------+-----------------------+--------+ ICA Prox  42      10              smooth and heterogenoustortuous +----------+--------+--------+--------+-----------------------+--------+ ICA Distal44      14                                              +----------+--------+--------+--------+-----------------------+--------+ ECA       93      12                                              +----------+--------+--------+--------+-----------------------+--------+ +----------+--------+-------+--------+-------------------+           PSV cm/sEDV cmsDescribeArm Pressure (mmHG) +----------+--------+-------+--------+-------------------+ [id number]                                         +----------+--------+-------+--------+-------------------+ +---------+--------+--+--------+-+---------+ VertebralPSV cm/s28EDV cm/s8Antegrade +---------+--------+--+--------+-+---------+  Left Carotid Findings: +----------+--------+-------+--------+--------------------------------+--------+           PSV cm/sEDV    StenosisPlaque Description              Comments                   cm/s                                                    +----------+--------+-------+--------+--------------------------------+--------+ CCA Prox  49      10             smooth and heterogenous                  +----------+--------+-------+--------+--------------------------------+--------+ CCA Distal48      13             smooth and heterogenous                  +----------+--------+-------+--------+--------------------------------+--------+ ICA Prox  35      13              smooth, heterogenous and                                                  calcific                                 +----------+--------+-------+--------+--------------------------------+--------+ ICA Distal43      17                                             tortuous +----------+--------+-------+--------+--------------------------------+--------+ ECA  163     19                                                      +----------+--------+-------+--------+--------------------------------+--------+ +----------+--------+--------+--------+-------------------+           PSV cm/sEDV cm/sDescribeArm Pressure (mmHG) +----------+--------+--------+--------+-------------------+ [id number]                                          +----------+--------+--------+--------+-------------------+ +---------+--------+--+--------+-+---------+ VertebralPSV cm/s41EDV cm/s7Antegrade +---------+--------+--+--------+-+---------+   Summary: Right Carotid: Velocities in the right ICA are consistent with a 1-39% stenosis. Left Carotid: Velocities in the left ICA are consistent with a 1-39% stenosis.  *See table(s) above for measurements and observations.     Preliminary     Procedures Procedures (including critical care time)  Medications Ordered in ED Medications  atorvastatin (LIPITOR) tablet 80 mg (80 mg Oral Given 05/07/20 0927)  fenofibrate tablet 54 mg (54 mg Oral Given 05/07/20 0927)   stroke: mapping our early stages of recovery book (has no administration in time range)  acetaminophen (TYLENOL) tablet 650 mg (has no administration in time range)    Or  acetaminophen (TYLENOL) 160 MG/5ML solution 650 mg (has no administration in time range)    Or  acetaminophen (TYLENOL) suppository 650 mg (has no administration in time range)  aspirin suppository 300 mg ( Rectal See Alternative 05/07/20 0927)    Or  aspirin tablet 325 mg (325 mg Oral Given 05/07/20 0927)  insulin aspart  (novoLOG) injection 0-9 Units (0 Units Subcutaneous Not Given 05/07/20 0757)  0.9 %  sodium chloride infusion ( Intravenous New Bag/Given 05/07/20 2458)    ED Course  I have reviewed the triage vital signs and the nursing notes.  Pertinent labs & imaging results that were available during my care of the patient were reviewed by me and considered in my medical decision making (see chart for details).  Clinical Course as of May 07 1108  Thu May 06, 2020  1706 Secretary tried to reach the [facility] [place] regarding patient's imaging but they have closed for the day.  I do not see any reports in PACS.  She is going to try to call one of the [place] hospital to see if they have any access to his records.   [MB]  0998 I was able to reach someone at the [place] and they were able to call the report.  They are faxing it over but primarily they said it shows acute to subacute left basal ganglia left subinsular stroke.   [MB]  3382 Discussed with Dr. Theda Sers neurology at [facility].  He said the patient would need to be admitted to the hospital to rule out any cardioembolic cause of his acute stroke.   [MB]  [id number] Discussed with Dr. Roel Cluck [facility] hospitalist will evaluate the patient for admission.   [MB]    Clinical Course User Index [MB] Hayden Rasmussen, MD   MDM Rules/Calculators/A&P                         This patient complains of abnormal MRI, possible acute stroke; this involves an extensive number of treatment Options and is a complaint that carries with it a high risk of  complications and Morbidity. The differential includes ischemic stroke, bleed, arrhythmia, metabolic derangement  I ordered, reviewed and interpreted labs, which included CBC with normal white count normal hemoglobin, chemistries normal other than elevated BUN and creatinine, normal magnesium, normal coags I ordered imaging studies which included chest x-ray and I independently    visualized and interpreted imaging which showed no acute  pulmonary disease Additional history obtained from patient's grandson Previous records obtained and reviewed in epic, also requested report [facility] neurology Dr. Theda Sers and [facility] hospitalist Dr. Roel Cluck and discussed lab and imaging findings  Critical Interventions: None  After the interventions stated above, I reevaluated the patient and found patient to be essentially asymptomatic.  Neurology is recommending the patient be admitted to the hospital for further work-up of the correctable risk factors for further stroke.  Patient agreeable to admission.   Final Clinical Impression(s) / ED Diagnoses Final diagnoses:  Acute CVA (cerebrovascular accident) [facility])    Rx / [place] Orders ED Discharge Orders    None       Hayden Rasmussen, MD 05/07/20 1114

## 2020-05-06 NOTE — H&P (Signed)
Brendan Long OIN:[id number] DOB: May 31, 1929 DOA: 05/06/2020     PCP: Isaac Bliss, Rayford Halsted, MD   Outpatient Specialists:  NONE   Patient arrived to ER on 05/06/20 at 1629 Referred by Attending Hayden Rasmussen, MD   Patient coming from: home Lives alone,   Chief Complaint:   Chief Complaint  Patient presents with  . possible stroke    HPI: Brendan Long is a 86 y.o. male with medical history significant of HTN, HLD, Hypothyroidism, atrial fibrillation status post ablation 15 years ago not on anticoagulation, prediabetes, COPD, history of elevated PSA, CKD stage III, multiple strokes on MRI  Presented with   abnormal MRI results patient was sent from [place] He had a scheduled MRI done today And was told to take aspirin and come to emergency department Patient was not endorsing any neurological complaints Had an outpatient MRI of the brain done today at [facility] and was called back today to be notified That he had an acute  CVA was instructed to present to emergency department. His only complaint this is been having some problems in his neck for past 6 months with some pain and cracking normal otherwise neurological deficits he did have some visual loss No falls. Infectious risk factors:  Reports none   Has  been vaccinated against COVID    Initial COVID TEST  NEGATIVE   Lab Results  Component Value Date   [place] 05/06/2020   [place] NEGATIVE 03/18/2019    Regarding pertinent Chronic problems:     Hyperlipidemia -  on statins on Lipitor [facility] Lipid Panel     Component Value Date/Time   CHOL 169 12/05/2019 1044   TRIG 217.0 (H) 12/05/2019 1044   TRIG 317 (HH) 07/13/2006 1523   HDL 28.40 (L) 12/05/2019 1044   CHOLHDL 6 12/05/2019 1044   VLDL 43.4 (H) 12/05/2019 1044   LDLCALC 101 (H) 03/19/2019 0225   LDLDIRECT 89.0 12/05/2019 1044     HTN on Norvasc metoprolol   DM 2 -  Lab Results  Component Value Date   HGBA1C 6.0 12/05/2019     diet controlled   Hypothyroidism:  Lab Results  Component Value Date   TSH 2.79 12/05/2019  not on synthroid      COPD - not  followed by pulmonology   not  on baseline oxygen     Hx of CVA -  with/out residual deficits on Aspirin  325,     A. Fib -   CHA2DS2/VAS Stroke Risk Points  6  Not on anticoagulation has been reportedly in sinus rhythm not been in atrial fibrillation      CKD stage III - baseline Cr 1.6 Estimated Creatinine Clearance: 26.2 mL/min (A) (by C-G formula based on SCr of 1.97 mg/dL (H)).  Lab Results  Component Value Date   CREATININE 1.97 (H) 05/06/2020   CREATININE 1.66 (H) 12/05/2019   CREATININE 1.63 (H) 03/21/2019     While in ER: Noted to have left visual field cut MRI results obtained showing acute to early subacute infarct of the left basal ganglia and left subinsular region   ER Provider Called:   Neurology Dr.  Theda Sers They Recommend admit to medicine okay to to keep at [facility]   And work up for embolic source  Pt passed swallow eval  Hospitalist was called for admission for CVA  The following Work up has been ordered so far:  Orders Placed This Encounter  Procedures  . Respiratory Panel  by RT PCR (Flu A&B, Covid) - Nasopharyngeal Swab  . Basic metabolic panel  . CBC with Differential  . Magnesium  . Protime-INR  . Cardiac monitoring  . Consult to Neuro Hospitalist  ALL PATIENTS BEING ADMITTED/HAVING PROCEDURES NEED COVID-19 SCREENING  . Consult to hospitalist  ALL PATIENTS BEING ADMITTED/HAVING PROCEDURES NEED COVID-19 SCREENING  . ED EKG  . EKG 12-Lead     Following Medications were ordered in ER: Medications - No data to display      Consult Orders  (From admission, onward)         Start     Ordered   05/06/20 1746  Consult to hospitalist  ALL PATIENTS BEING ADMITTED/HAVING PROCEDURES NEED COVID-19 SCREENING  Once       Comments: ALL PATIENTS BEING ADMITTED/HAVING PROCEDURES NEED COVID-19 SCREENING  Provider:  (Not yet  assigned)  Question Answer Comment  Place call to: [facility]   Reason for Consult Admit      05/06/20 1745           Significant initial  Findings: Abnormal Labs Reviewed  BASIC METABOLIC PANEL - Abnormal; Notable for the following components:      Result Value   BUN 38 (*)    Creatinine, Ser 1.97 (*)    GFR calc non Af Amer 29 (*)    GFR calc Af Amer 34 (*)    All other components within normal limits  CBC WITH DIFFERENTIAL/PLATELET - Abnormal; Notable for the following components:   Eosinophils Absolute 0.8 (*)    All other components within normal limits    Otherwise labs showing:    Recent Labs  Lab 05/06/20 1645  NA 137  K 3.7  CO2 28  GLUCOSE 79  BUN 38*  CREATININE 1.97*  CALCIUM 9.6  MG 2.2    Cr  Up from baseline see below Lab Results  Component Value Date   CREATININE 1.97 (H) 05/06/2020   CREATININE 1.66 (H) 12/05/2019   CREATININE 1.63 (H) 03/21/2019    No results for input(s): AST, ALT, ALKPHOS, BILITOT, PROT, ALBUMIN in the last 168 hours. Lab Results  Component Value Date   CALCIUM 9.6 05/06/2020   WBC       Component Value Date/Time   WBC 9.9 05/06/2020 1645   LYMPHSABS 3.2 05/06/2020 1645   MONOABS 0.7 05/06/2020 1645   EOSABS 0.8 (H) 05/06/2020 1645   BASOSABS 0.1 05/06/2020 1645    Plt: Lab Results  Component Value Date   PLT 227 05/06/2020     HG/HCT  stable,       Component Value Date/Time   HGB 15.9 05/06/2020 1645   HCT 46.6 05/06/2020 1645   MCV 93.8 05/06/2020 1645    No results for input(s): LIPASE, AMYLASE in the last 168 hours. No results for input(s): AMMONIA in the last 168 hours.    Troponin 8     ECG: Ordered Personally reviewed by me showing: HR : 61 Rhythm:  NSR,  1st degree AV block   no evidence of ischemic changes QTC 434   DM  labs:  HbA1C: Recent Labs    12/05/19 1044  HGBA1C 6.0      UA   no evidence of UTI    Urine analysis:    Component Value Date/Time   COLORURINE  YELLOW 05/06/2020 2255   APPEARANCEUR CLEAR 05/06/2020 2255   LABSPEC 1.019 05/06/2020 2255   PHURINE 6.0 05/06/2020 2255   GLUCOSEU NEGATIVE 05/06/2020 2255   GLUCOSEU  NEGATIVE 12/15/2016 1015   HGBUR NEGATIVE 05/06/2020 2255   HGBUR negative 11/09/2009 0000   BILIRUBINUR NEGATIVE 05/06/2020 2255   BILIRUBINUR n 11/15/2010 [place] 05/06/2020 2255   PROTEINUR 30 (A) 05/06/2020 2255   UROBILINOGEN 0.2 12/15/2016 1015   NITRITE NEGATIVE 05/06/2020 2255   LEUKOCYTESUR NEGATIVE 05/06/2020 2255      Ordered    CXR -  NON acute MRi Results               ED Triage Vitals [05/06/20 1637]  Enc Vitals Group     BP (!) 153/95     Pulse Rate 62     Resp 13     Temp 98.1 F (36.7 C)     Temp Source Oral     SpO2 99 %     Weight 176 lb (79.8 kg)     Height 5\' 10"  (1.778 m)     Head Circumference      Peak Flow      Pain Score 0     Pain Loc      Pain Edu?      Excl. in [place]?   HYIF(02)@       Latest  Blood pressure (!) 151/86, pulse 61, temperature 98.1 F (36.7 C), temperature source Oral, resp. rate 17, height 5\' 10"  (1.778 m), weight 79.8 kg, SpO2 98 %.    Review of Systems:    Pertinent positives include:   fatigue,  Constitutional:  No weight loss, night sweats, Fevers, chills, weight loss  HEENT:  No headaches, Difficulty swallowing,Tooth/dental problems,Sore throat,  No sneezing, itching, ear ache, nasal congestion, post nasal drip,  Cardio-vascular:  No chest pain, Orthopnea, PND, anasarca, dizziness, palpitations.no Bilateral lower extremity swelling  GI:  No heartburn, indigestion, abdominal pain, nausea, vomiting, diarrhea, change in bowel habits, loss of appetite, melena, blood in stool, hematemesis Resp:  no shortness of breath at rest. No dyspnea on exertion, No excess mucus, no productive cough, No non-productive cough, No coughing up of blood.No change in color of mucus.No wheezing. Skin:  no rash or lesions. No jaundice GU:    no dysuria, change in color of urine, no urgency or frequency. No straining to urinate.  No flank pain.  Musculoskeletal:  No joint pain or no joint swelling. No decreased range of motion. No back pain.  Psych:  No change in mood or affect. No depression or anxiety. No memory loss.  Neuro: no localizing neurological complaints, no tingling, no weakness, no double vision, no gait abnormality, no slurred speech, no confusion  All systems reviewed and apart from [place] all are negative  Past Medical History:   Past Medical History:  Diagnosis Date  . ED (erectile dysfunction)   . Hyperlipidemia   . Hypertension   . Hypothyroidism   . Psoriasis   . Varicose veins   . Vitamin D deficiency      Past Surgical History:  Procedure Laterality Date  . JOINT REPLACEMENT    . joint replacement rt and left    . rotator cuff repair, right shoulder per Dr. Tonita Cong  1996  . torn right bicepts muscle  1996  . TOTAL HIP ARTHROPLASTY  01/17/10   redone on right hip on 09/05/10    Social History:  Ambulatory  independently       reports that he has quit smoking. He has never used smokeless tobacco. He reports that he does not drink alcohol and does not use drugs.  Family History:   Family History  Problem Relation Age of Onset  . Breast cancer Other     Allergies: No Known Allergies   Prior to Admission medications   Medication Sig Start Date End Date Taking? Authorizing Provider  aspirin 325 MG tablet Take 325 mg by mouth 2 (two) times daily.    Yes [provider]  atorvastatin (LIPITOR) 80 MG tablet TAKE 1 TABLET DAILY 07/24/17  Yes Renato Shin, MD  clobetasol ointment (TEMOVATE) 0.05 % APPLY TOPICALLY TWICE A DAY Patient taking differently: Apply 1 application topically 2 (two) times daily as needed (rash).  10/21/18  Yes Isaac Bliss, Rayford Halsted, MD  diclofenac sodium (VOLTAREN) 1 % GEL APPLY 4 GRAMS TOPICALLY FOUR TIMES A DAY AS NEEDED FOR KNEE PAIN Patient  taking differently: Apply 4 g topically 4 (four) times daily as needed (pain).  04/22/18  Yes Renato Shin, MD  metoprolol tartrate (LOPRESSOR) 50 MG tablet Take 1 tablet (50 mg total) by mouth 2 (two) times daily. 02/11/20  Yes Isaac Bliss, Rayford Halsted, MD  TRICOR 48 MG tablet TAKE 1 TABLET DAILY 07/15/19  Yes Isaac Bliss, Rayford Halsted, MD  amLODipine (NORVASC) 10 MG tablet Take 1 tablet (10 mg total) by mouth daily. Patient not taking: Reported on 05/06/2020 03/22/19   Monica Becton, MD  budesonide-formoterol [facility]) 80-4.5 MCG/ACT inhaler Inhale 2 puffs into the lungs 2 (two) times daily. Patient not taking: Reported on 05/06/2020 12/03/17   Renato Shin, MD  HYDROcodone-Acetaminophen 5-300 MG TABS Take 1 tablet by mouth every 6 (six) hours as needed. Patient not taking: Reported on 05/06/2020 12/05/19   Isaac Bliss, Rayford Halsted, MD  omega-3 acid ethyl esters (LOVAZA) 1 G capsule Take 2 capsules (2 g total) by mouth 2 (two) times daily. 03/11/13 03/11/14  Renato Shin, MD  polyethylene glycol (MIRALAX / GLYCOLAX) 17 g packet Take 17 g by mouth daily as needed for severe constipation. Patient not taking: Reported on 05/06/2020 03/21/19   Monica Becton, MD  sildenafil (VIAGRA) 100 MG tablet TAKE 1 TABLET AS NEEDED ONE HOUR BEFORE NEEDED FOR ERECTILE DYSFUNCTION 09/23/19   Isaac Bliss, Rayford Halsted, MD  Vitamin D, Ergocalciferol, (DRISDOL) 1.25 MG (50000 UNIT) CAPS capsule TAKE 1 CAPSULE EVERY 7 DAYS FOR 12 DOSES Patient not taking: Reported on 05/06/2020 02/13/20   Isaac Bliss, Rayford Halsted, MD   Physical Exam: Vitals with BMI 05/06/2020 05/06/2020 05/06/2020  Height - - -  Weight - - -  BMI - - -  Systolic 124 580 998  Diastolic 86 77 78  Pulse 61 64 62     1. General:  in No  Acute distress   Chronically ill   -appearing 2. Psychological: Alert and   Oriented 3. Head/ENT:   Dry Mucous Membranes                          Head Non traumatic, neck supple                          Poor  Dentition 4. SKIN: normal  Skin turgor,  Skin clean Dry and intact no rash 5. Heart: Regular rate and rhythm no  Murmur, no Rub or gallop 6. Lungs:   no wheezes or crackles   7. Abdomen: Soft,  non-tender, Non distended bowel sounds present 8. Lower extremities: no clubbing, cyanosis, no   edema 9. Neurologically   strength 5 out of 5  in all 4 extremities cranial nerves II through XII intact 10. MSK: Normal range of motion   All other LABS:     Recent Labs  Lab 05/06/20 1645  WBC 9.9  NEUTROABS 5.1  HGB 15.9  HCT 46.6  MCV 93.8  PLT 227     Recent Labs  Lab 05/06/20 1645  NA 137  K 3.7  CL 101  CO2 28  GLUCOSE 79  BUN 38*  CREATININE 1.97*  CALCIUM 9.6  MG 2.2     No results for input(s): AST, ALT, ALKPHOS, BILITOT, PROT, ALBUMIN in the last 168 hours.     Cultures: No results found for: SDES, [place], CULT, REPTSTATUS   Radiological Exams on Admission: DG Chest 2 View  Result Date: 05/06/2020 CLINICAL DATA:  Stroke EXAM: CHEST - 2 VIEW COMPARISON:  11/21/2017 FINDINGS: No focal opacity or pleural effusion. Stable cardiomediastinal silhouette with aortic atherosclerosis. No pneumothorax. Postsurgical changes of the right humeral head IMPRESSION: No active cardiopulmonary disease. Electronically Signed   By: Donavan Foil M.D.   On: 05/06/2020 20:36       Chart has been reviewed    Assessment/Plan   86 y.o. male with medical history significant of HTN, HLD, Hypothyroidism, atrial fibrillation status post ablation 15 years ago not on anticoagulation, prediabetes, COPD, history of elevated PSA, CKD stage III, multiple strokes on MRI  Admitted for New CVA  Present on Admission: . Stroke [facility]) -  - will admit based on TIA/CVA protocol,          Monitor on Tele        MRI   Resulted - showing acute ischemic CVA         Carotid Doppler        Echo to evaluate for possible embolic source,        obtain cardiac enzymes,  ECG,   Lipid panel, TSH.         Order PT/OT evaluation.        Pt passed swallow eval        Will make sure patient is on antiplatelet ASA   325    and statin        Allow permissive Hypertension keep BP <220/120        Neurology consulted  May need to confirm in AM if he will be seen by them  . AF (paroxysmal atrial fibrillation) (HCC) -remote not on anticoagulation currently in sinus rhythm continue to monitor on telemetry obtain echogram will benefit from loop recorder given MRI of multiple strokes  . DM (diabetes mellitus), type 2 with renal complications (HCC) -order sliding scale Diet controlled at baseline  . AKI (acute kidney injury) ([place]) -acute on chronic renal insufficiency, order urine electrolytes gently rehydrate and follow fluid status   . Essential hypertension allow permissive hypertension for tonight   . Dyslipidemia continue home medication check lipid panel   Other plan as per orders.  DVT prophylaxis:  SCD      Code Status:    Code Status: Prior FULL CODE  as per patient   I had personally discussed CODE STATUS with patient    Family Communication:   Family not at  Bedside   Disposition Plan:    To home once workup is complete and patient is stable   Following barriers for discharge:  PT OT assessed and                            Embolic source of CVA worked up                           Will need consultants to evaluate patient prior to discharge                       Would benefit from PT/OT eval prior to DC  Ordered                                      Transition of care consulted                                        Consults called:Neurology is aware  Admission status:  ED Disposition    ED Disposition Condition [place]: [facility] [100102]  Level of Care: Telemetry [5]  Admit to tele based on following criteria: Other see comments  Comments: stroke  May admit patient to [facility] or [facility] if  equivalent level of care is available:: No  Covid Evaluation: Confirmed COVID Negative  Diagnosis: Stroke [facility]) [[id number]]  Admitting Physician: Toy Baker [[id number]]  Attending Physician: Toy Baker [[id number]]  Estimated length of stay: 3 - 4 days  Certification:: I certify this patient will need inpatient services for at least 2 midnights         inpatient     I Expect 2 midnight stay secondary to severity of patient's current illness need for inpatient interventions justified by the following:     Severe lab/radiological/exam abnormalities including:    MRI showing new CVA and extensive comorbidities including:    DM2       COPD/asthma    CKD .    That are currently affecting medical management.   I expect  patient to be hospitalized for 2 midnights requiring inpatient medical care.  Patient is at high risk for adverse outcome (such as loss of life or disability) if not treated.  Indication for inpatient stay as follows:  Acute CVA       Level of care    tele  indefinitely please discontinue once patient no longer qualifies COVID-19 Labs    Lab Results  Component Value Date   [place] NEGATIVE 05/06/2020     Precautions: admitted as  Covid Negative     PPE: Used by the provider:   P100  eye Goggles,  Gloves     Lakita Sahlin 05/07/2020, 1:13 AM    [facility]     after 2 AM please page floor coverage PA If 7AM-7PM, please contact the day team taking care of the patient using [email or web address]   Patient was evaluated in the context of the global COVID-19 pandemic, which necessitated consideration that the patient might be at risk for infection with the SARS-CoV-2 virus that causes COVID-19. Institutional protocols and algorithms that pertain to the evaluation of patients at risk for COVID-19 are in a state of rapid change based on information released by regulatory bodies including the CDC and federal and state organizations. These  policies and algorithms were  followed during the patient's care.

## 2020-05-07 ENCOUNTER — Inpatient Hospital Stay (HOSPITAL_COMMUNITY): Payer: Medicare Other

## 2020-05-07 ENCOUNTER — Encounter (HOSPITAL_COMMUNITY): Payer: Self-pay | Admitting: Internal Medicine

## 2020-05-07 DIAGNOSIS — I639 Cerebral infarction, unspecified: Secondary | ICD-10-CM

## 2020-05-07 DIAGNOSIS — R931 Abnormal findings on diagnostic imaging of heart and coronary circulation: Secondary | ICD-10-CM | POA: Diagnosis present

## 2020-05-07 DIAGNOSIS — E1122 Type 2 diabetes mellitus with diabetic chronic kidney disease: Secondary | ICD-10-CM

## 2020-05-07 DIAGNOSIS — I480 Paroxysmal atrial fibrillation: Secondary | ICD-10-CM | POA: Diagnosis present

## 2020-05-07 DIAGNOSIS — I6389 Other cerebral infarction: Secondary | ICD-10-CM

## 2020-05-07 DIAGNOSIS — I63412 Cerebral infarction due to embolism of left middle cerebral artery: Secondary | ICD-10-CM

## 2020-05-07 DIAGNOSIS — Z8673 Personal history of transient ischemic attack (TIA), and cerebral infarction without residual deficits: Secondary | ICD-10-CM

## 2020-05-07 DIAGNOSIS — E1129 Type 2 diabetes mellitus with other diabetic kidney complication: Secondary | ICD-10-CM | POA: Diagnosis present

## 2020-05-07 DIAGNOSIS — N183 Chronic kidney disease, stage 3 unspecified: Secondary | ICD-10-CM | POA: Diagnosis present

## 2020-05-07 DIAGNOSIS — N179 Acute kidney failure, unspecified: Secondary | ICD-10-CM | POA: Diagnosis present

## 2020-05-07 LAB — CBC WITH DIFFERENTIAL/PLATELET
Abs Immature Granulocytes: 0.02 10*3/uL (ref 0.00–0.07)
Basophils Absolute: 0.1 10*3/uL (ref 0.0–0.1)
Basophils Relative: 1 %
Eosinophils Absolute: 0.7 10*3/uL — ABNORMAL HIGH (ref 0.0–0.5)
Eosinophils Relative: 10 %
HCT: 43.5 % (ref 39.0–52.0)
Hemoglobin: 14.5 g/dL (ref 13.0–17.0)
Immature Granulocytes: 0 %
Lymphocytes Relative: 29 %
Lymphs Abs: 2.1 10*3/uL (ref 0.7–4.0)
MCH: 31.4 pg (ref 26.0–34.0)
MCHC: 33.3 g/dL (ref 30.0–36.0)
MCV: 94.2 fL (ref 80.0–100.0)
Monocytes Absolute: 0.5 10*3/uL (ref 0.1–1.0)
Monocytes Relative: 6 %
Neutro Abs: 3.8 10*3/uL (ref 1.7–7.7)
Neutrophils Relative %: 54 %
Platelets: 181 10*3/uL (ref 150–400)
RBC: 4.62 MIL/uL (ref 4.22–5.81)
RDW: 13.6 % (ref 11.5–15.5)
WBC: 7.1 10*3/uL (ref 4.0–10.5)
nRBC: 0 % (ref 0.0–0.2)

## 2020-05-07 LAB — ECHOCARDIOGRAM COMPLETE
Area-P 1/2: 3.17 cm2
Height: 70 in
S' Lateral: 2.8 cm
Weight: 2816 oz

## 2020-05-07 LAB — GLUCOSE, CAPILLARY
Glucose-Capillary: 72 mg/dL (ref 70–99)
Glucose-Capillary: 90 mg/dL (ref 70–99)
Glucose-Capillary: 135 mg/dL — ABNORMAL HIGH (ref 70–99)
Glucose-Capillary: 133 mg/dL — ABNORMAL HIGH (ref 70–99)

## 2020-05-07 LAB — BASIC METABOLIC PANEL
Anion gap: 12 (ref 5–15)
BUN: 30 mg/dL — ABNORMAL HIGH (ref 8–23)
CO2: 25 mmol/L (ref 22–32)
Calcium: 9 mg/dL (ref 8.9–10.3)
Chloride: 102 mmol/L (ref 98–111)
Creatinine, Ser: 1.78 mg/dL — ABNORMAL HIGH (ref 0.61–1.24)
GFR calc Af Amer: 38 mL/min — ABNORMAL LOW (ref >60)
GFR calc non Af Amer: 33 mL/min — ABNORMAL LOW (ref >60)
Glucose, Bld: 95 mg/dL (ref 70–99)
Potassium: 3.6 mmol/L (ref 3.5–5.1)
Sodium: 139 mmol/L (ref 135–145)

## 2020-05-07 LAB — LIPID PANEL
Cholesterol: 194 mg/dL (ref 0–200)
HDL: 26 mg/dL — ABNORMAL LOW (ref >40)
LDL Cholesterol: 132 mg/dL — ABNORMAL HIGH (ref 0–99)
Total CHOL/HDL Ratio: 7.5 RATIO
Triglycerides: 182 mg/dL — ABNORMAL HIGH (ref <150)
VLDL: 36 mg/dL (ref 0–40)

## 2020-05-07 LAB — CBG MONITORING, ED
Glucose-Capillary: 128 mg/dL — ABNORMAL HIGH (ref 70–99)
Glucose-Capillary: 98 mg/dL (ref 70–99)
Glucose-Capillary: 93 mg/dL (ref 70–99)

## 2020-05-07 LAB — SODIUM, URINE, RANDOM: Sodium, Ur: 132 mmol/L

## 2020-05-07 LAB — HEMOGLOBIN A1C
Hgb A1c MFr Bld: 6 % — ABNORMAL HIGH (ref 4.8–5.6)
Mean Plasma Glucose: 125.5 mg/dL

## 2020-05-07 LAB — MAGNESIUM: Magnesium: 2 mg/dL (ref 1.7–2.4)

## 2020-05-07 LAB — CREATININE, URINE, RANDOM: Creatinine, Urine: 158.01 mg/dL

## 2020-05-07 MED ORDER — POLYETHYLENE GLYCOL 3350 17 G PO PACK: 17.0000 g | PACK | Freq: Every day | ORAL | Status: DC | PRN

## 2020-05-07 MED ORDER — MOMETASONE FURO-FORMOTEROL FUM 100-5 MCG/ACT IN AERO
2.0000 | INHALATION_SPRAY | Freq: Two times a day (BID) | RESPIRATORY_TRACT | Status: DC
  Filled 2020-05-07: qty 8.8

## 2020-05-07 MED ORDER — HEPARIN SODIUM (PORCINE) 5000 UNIT/ML IJ SOLN: 5000.0000 [IU] | Freq: Three times a day (TID) | INTRAMUSCULAR | Status: DC

## 2020-05-07 MED ORDER — INSULIN ASPART 100 UNIT/ML ~~LOC~~ SOLN
0.0000 [IU] | SUBCUTANEOUS | Status: DC
  Filled 2020-05-07: qty 0.09

## 2020-05-07 MED ORDER — OMEGA-3-ACID ETHYL ESTERS 1 G PO CAPS
2.0000 g | ORAL_CAPSULE | Freq: Two times a day (BID) | ORAL | Status: DC
  Administered 2020-05-08: 2 g via ORAL
  Filled 2020-05-07: qty 2

## 2020-05-07 MED ORDER — SODIUM CHLORIDE 0.9 % IV SOLN: INTRAVENOUS | Status: AC

## 2020-05-07 NOTE — Evaluation (Signed)
Occupational Therapy Evaluation Patient Details Name: Brendan Long MRN: [id number] DOB: 12/27/1928 Today's Date: 05/07/2020    History of Present Illness Patient is a pleasant 86 year old gentleman history of hypertension, hyperlipidemia, hypothyroidism, A. 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  Patient denies any neurological deficits.  Patient admitted for stroke work-up.   Clinical Impression   Brendan Long is an  86 year old man with the above medical history who presents supine in bed and without report of deficits. Patient demonstrated ability to perform bed transfers, sit to stand, ambulation in hall and ADLs. Patient exhibits normal ROM, strength and coordination of upper extremities. Patient demonstrated functional visual perception and motor control during evaluation. With peripheral vision testing patient exhibited a mild difference in when he located stimuli. Patient able to locate fingers in all four quadrants, read items on both sides of the room, locate items on the sink, negotiate hallway environment, and locate his room from the hallway. No OT needs at this time.    Follow Up Recommendations  No OT follow up    Equipment Recommendations  None recommended by OT    Recommendations for Other Services       Precautions / Restrictions Precautions Precautions: None Restrictions Weight Bearing Restrictions: No      Mobility Bed Mobility Overal bed mobility: Independent                Transfers Overall transfer level: Independent                    Balance Overall balance assessment: Independent                                         ADL either performed or assessed with clinical judgement   ADL Overall ADL's : Modified independent                                        General ADL Comments: Increased time.     Vision   Additional Comments: Per note patient found to have left visual field cut. Testing demonstrated a mild difference in when patient saw left sided stimulus compared to right. However, patient demonstrates functional vision with reading and locating items on both his right and left. Patient performed environmental negotiation in hallway without incident.     Perception     Praxis      Pertinent Vitals/Pain Pain Assessment: No/denies pain     Hand Dominance Right   Extremity/Trunk Assessment Upper Extremity Assessment Upper Extremity Assessment: RUE deficits/detail;LUE deficits/detail RUE Deficits / Details: 5/5 ROM and strength RUE Sensation: WNL RUE Coordination: WNL LUE Deficits / Details: Decreased strength in left shoulder (chronic injury) otherwise 5/5 LUE: Unable to fully assess due to pain LUE Sensation: WNL   Lower Extremity Assessment Lower Extremity Assessment: Defer to PT evaluation   Cervical / Trunk Assessment Cervical / Trunk Assessment: Normal   Communication Communication Communication: No difficulties   Cognition Arousal/Alertness: Awake/alert Behavior During Therapy: WFL for tasks assessed/performed Overall Cognitive Status: Within Functional Limits for tasks assessed  General Comments       Exercises     Shoulder Instructions      Home Living Family/patient expects to be discharged to:: Private residence Living Arrangements: Alone   Type of Home: House Home Access: Level entry     Home Layout: One level     Bathroom Shower/Tub: Occupational psychologist: Standard Bathroom Accessibility: Yes   Home Equipment: Environmental consultant - 2 wheels   Additional Comments: Patient drives and cooks for himself.      Prior Functioning/Environment Level of Independence: Independent                  OT Problem List:        OT Treatment/Interventions:      OT Goals(Current goals can be found in the care plan section) Acute Rehab OT Goals Patient Stated Goal: Did not state OT Goal Formulation: All assessment and education complete, DC therapy  OT Frequency:     Barriers to D/C:            Co-evaluation              AM-PAC OT "6 Clicks" Daily Activity     Outcome Measure Help from another person eating meals?: None Help from another person taking care of personal grooming?: None Help from another person toileting, which includes using toliet, bedpan, or urinal?: None Help from another person bathing (including washing, rinsing, drying)?: None Help from another person to put on and taking off regular upper body clothing?: None Help from another person to put on and taking off regular lower body clothing?: None 6 Click Score: 24   End of Session Equipment Utilized During Treatment: Gait belt Nurse Communication: Mobility status  Activity Tolerance: Patient tolerated treatment well Patient left: in chair;with call bell/phone within reach  OT Visit Diagnosis: Other symptoms and signs involving the nervous system (R29.898)                Time: 3612-2449 OT Time Calculation (min): 28 min Charges:  OT General Charges $OT Visit: 1 Visit OT Evaluation $OT Eval Moderate Complexity: 1 Mod  Brendan Long, OTR/L [place]  Office [phone] Pager: [place] 05/07/2020, 5:14 PM

## 2020-05-07 NOTE — Evaluation (Signed)
SLP Cancellation Note  Patient Details Name: Brendan Long MRN: [id number] DOB: September 13, 1928   Cancelled treatment:       Reason Eval/Treat Not Completed: Other (comment) (pt was in transition to floor when SLP was to go to ED for eval, will continue efforts)  Kathleen Lime, MS [facility] SLP Acute Rehab Services Office [phone]  Macario Golds 05/07/2020, 12:53 PM

## 2020-05-07 NOTE — Progress Notes (Addendum)
PROGRESS NOTE    Brendan Long  FUX:[id number] DOB: [birth year] DOA: 05/06/2020 PCP: Isaac Bliss, Rayford Halsted, MD    Chief Complaint  Patient presents with  . possible stroke    Brief Narrative:  Patient is a pleasant 86 year old gentleman history of hypertension, hyperlipidemia, hypothyroidism, A. 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  Patient denies any neurological deficits.  Patient admitted for stroke work-up.   Assessment & Plan:   Principal Problem:   CVA (cerebral vascular accident) ([place]) Active Problems:   Dyslipidemia   Essential hypertension   Stroke ([place])   History of CVA (cerebrovascular accident)   AF (paroxysmal atrial fibrillation) (HCC)   DM (diabetes mellitus), type 2 with renal complications (HCC)   AKI (acute kidney injury) ([place])   Abnormal echocardiogram   CKD (chronic kidney disease) stage 3, GFR 30-59 ml/min (HCC)   1  Acute versus early subcutaneous infarcts Noted on MRI done in the outpatient setting on 05/06/2020 which was concerning for acute or early subacute infarct in the left basal ganglia and left subinsular region, multiple interval but nonacute infarcts in the right pons and bilateral cerebellum, stable appearance of old right occipital lobe infarct.  Patient denies any neurological deficits.  Patient admitted for stroke work-up.  Carotid Dopplers done with preliminary results negative for significant ICA stenosis.  2D echo done with hypokinesis of the basal/mid inferior lateral wall with overall normal LV, grade 1 diastolic dysfunction, no source of emboli noted.  Patient currently on telemetry.  Fasting lipid panel with LDL of 132.  Goal LDL less than 70.  Patient on statin prior to admission which has been resumed.  Patient also on aspirin for stroke  prophylaxis.  Patient will likely need a TEE for further evaluation and possibly loop recorder.  Neurology consulted and consultation pending.  May need to be transferred to [facility] to be assessed by the stroke service as patient may need a TEE and loop recorder however will defer to neurology.  2.  Abnormal 2D echo Patient noted to have an abnormal 2D echo with work-up for #1.  2D echo with some wall motion abnormalities.  Patient with multiple risk factors of hypertension, hyperlipidemia, prior history of A. fib status post ablation, diabetes mellitus.  Cardiac enzymes negative.  EKG with no wall motion abnormalities.  Patient denies any chest pain. Continue statin, aspirin, fenofibrate.  Consulted cardiology for further evaluation and management.  3.  History of A. fib status post ablation Currently in sinus rhythm.  Not on anticoagulation.  May need anticoagulation secondary to problem #1.  May need loop recorder secondary to problem #1.  Cardiology consulted.  4.  Hypertension Permissive hypertension secondary to problem #1.  Could likely resume home dose Lopressor and tomorrow and subsequently Norvasc in the following days.  Follow.  5.  Well-controlled diabetes mellitus II Patient with a history of type 2 diabetes mellitus however not on any hypoglycemic agents.  Hemoglobin A1c of 6.0.  Sliding scale insulin.  6.  Hyperlipidemia Fasting lipid panel with LDL of 132.  Goal LDL < 70.  Patient noted to be on statin prior to admission which will be continued.  Continue fenofibrate.  Follow.  7.  Acute kidney injury on chronic kidney disease stage IIIb Urinalysis nitrite negative, leukocytes negative, 30 protein.  Urine sodium  of 132.  Urine creatinine of 158.01.  Renal function improving with gentle hydration.  Continue gentle hydration for another 24 hours.  Follow.  8.  COPD Stable.  Resume home regiment Symbicort.  Nebs as needed.  DVT prophylaxis: Heparin Code Status:  Full Family Communication: Updated patient and grandson at bedside. Disposition:   Status is: Inpatient    Dispo: The patient is from: Home              Anticipated d/c is to: Home              Anticipated d/c date is: 1-2 days.              Patient currently undergoing stroke work-up.  Transferred to [facility] per neurology request.  Not stable for discharge.       Consultants:   Neurology pending  Cardiology pending  Procedures:   2D echo 05/07/2020  Carotid Dopplers 05/07/2020  Antimicrobials:   None   Subjective: Patient on gurney in the ED.  Denies any symptoms.  Denies any diplopia, no dysphagia, no asymmetric weakness or numbness, no gait instability, no difficulty swallowing.  Feeling better.  Asking when he can go home.  Objective: Vitals:   05/07/20 1002 05/07/20 1039 05/07/20 1139 05/07/20 1215  BP: (!) 167/103 (!) 158/105 (!) 167/98 (!) 168/104  Pulse: 69 68 72 73  Resp: 17 16 15 18   Temp:    97.8 F (36.6 C)  TempSrc:    Oral  SpO2: 98% 98% 100% 99%  Weight:      Height:        Intake/Output Summary (Last 24 hours) at 05/07/2020 1503 Last data filed at 05/07/2020 1300 Gross per 24 hour  Intake --  Output 200 ml  Net -200 ml   Filed Weights   05/06/20 1637  Weight: 79.8 kg    Examination:  General exam: Appears calm and comfortable  Respiratory system: Clear to auscultation. Respiratory effort normal. Cardiovascular system: S1 & S2 heard, RRR. No JVD, murmurs, rubs, gallops or clicks. No pedal edema. Gastrointestinal system: Abdomen is nondistended, soft and nontender. No organomegaly or masses felt. Normal bowel sounds heard. Central nervous system: Alert and oriented.  Cranial nerves II through XII grossly intact.  No focal neurological deficits. Extremities: Symmetric 5 x 5 power. Skin: No rashes, lesions or ulcers Psychiatry: Judgement and insight appear normal. Mood & affect appropriate.     Data Reviewed: I have personally  reviewed following labs and imaging studies  CBC: Recent Labs  Lab 05/06/20 1645 05/07/20 0941  WBC 9.9 7.1  NEUTROABS 5.1 3.8  HGB 15.9 14.5  HCT 46.6 43.5  MCV 93.8 94.2  PLT 227 440    Basic Metabolic Panel: Recent Labs  Lab 05/06/20 1645 05/07/20 0941  NA 137 139  K 3.7 3.6  CL 101 102  CO2 28 25  GLUCOSE 79 95  BUN 38* 30*  CREATININE 1.97* 1.78*  CALCIUM 9.6 9.0  MG 2.2 2.0    GFR: Estimated Creatinine Clearance: 29 mL/min (A) (by C-G formula based on SCr of 1.78 mg/dL (H)).  Liver Function Tests: No results for input(s): AST, ALT, ALKPHOS, BILITOT, PROT, ALBUMIN in the last 168 hours.  CBG: Recent Labs  Lab 05/07/20 0101 05/07/20 0335 05/07/20 0755 05/07/20 1206  GLUCAP 128* 98 93 72     Recent Results (from the past 240 hour(s))  Respiratory Panel by RT PCR (Flu A&B, Covid) - Nasopharyngeal Swab  Status: None   Collection Time: 05/06/20  6:20 PM   Specimen: Nasopharyngeal Swab  Result Value Ref Range Status   SARS Coronavirus 2 by RT PCR NEGATIVE NEGATIVE Final    Comment: (NOTE) SARS-CoV-2 target nucleic acids are NOT DETECTED.  The SARS-CoV-2 RNA is generally detectable in upper respiratoy specimens during the acute phase of infection. The lowest concentration of SARS-CoV-2 viral copies this assay can detect is 131 copies/mL. A negative result does not preclude SARS-Cov-2 infection and should not be used as the sole basis for treatment or other patient management decisions. A negative result may occur with  improper specimen collection/handling, submission of specimen other than nasopharyngeal swab, presence of viral mutation(s) within the areas targeted by this assay, and inadequate number of viral copies (<131 copies/mL). A negative result must be combined with clinical observations, patient history, and epidemiological information. The expected result is Negative.  Fact Sheet for Patients:   PinkCheek.be  Fact Sheet for Healthcare Providers:  GravelBags.it  This test is no t yet approved or cleared by the [place] FDA and  has been authorized for detection and/or diagnosis of SARS-CoV-2 by FDA under an Emergency Use Authorization (EUA). This EUA will remain  in effect (meaning this test can be used) for the duration of the COVID-19 declaration under Section 564(b)(1) of the Act, 21 U.S.C. section 360bbb-3(b)(1), unless the authorization is terminated or revoked sooner.     Influenza A by PCR NEGATIVE NEGATIVE Final   Influenza B by PCR NEGATIVE NEGATIVE Final    Comment: (NOTE) The Xpert Xpress SARS-CoV-2/FLU/RSV assay is intended as an aid in  the diagnosis of influenza from Nasopharyngeal swab specimens and  should not be used as a sole basis for treatment. Nasal washings and  aspirates are unacceptable for Xpert Xpress SARS-CoV-2/FLU/RSV  testing.  Fact Sheet for Patients: PinkCheek.be  Fact Sheet for Healthcare Providers: GravelBags.it  This test is not yet approved or cleared by the [place] FDA and  has been authorized for detection and/or diagnosis of SARS-CoV-2 by  FDA under an Emergency Use Authorization (EUA). This EUA will remain  in effect (meaning this test can be used) for the duration of the  Covid-19 declaration under Section 564(b)(1) of the Act, 21  U.S.C. section 360bbb-3(b)(1), unless the authorization is  terminated or revoked. Performed at [facility], [place] [address], [place], [place] [address]          Radiology Studies: DG Chest 2 View  Result Date: 05/06/2020 CLINICAL DATA:  Stroke EXAM: CHEST - 2 VIEW COMPARISON:  11/21/2017 FINDINGS: No focal opacity or pleural effusion. Stable cardiomediastinal silhouette with aortic atherosclerosis. No pneumothorax. Postsurgical changes of the right  humeral head IMPRESSION: No active cardiopulmonary disease. Electronically Signed   By: Donavan Foil M.D.   On: 05/06/2020 20:36   ECHOCARDIOGRAM COMPLETE  Result Date: 05/07/2020    ECHOCARDIOGRAM REPORT   Patient Name:   JEFFERY BACHMEIER [facility] Date of Exam: 05/07/2020 Medical Rec #:  [id number]          Height:       70.0 in Accession #:    [id number]         Weight:       176.0 lb Date of Birth:  May 12, 1929          BSA:          1.977 m Patient Age:    89 years  BP:           160/97 mmHg Patient Gender: M                  HR:           70 bpm. Exam Location:  Inpatient Procedure: 2D Echo, Color Doppler and Cardiac Doppler Indications:    Stroke i163.9  History:        Patient has prior history of Echocardiogram examinations, most                 recent 03/19/2019. Arrythmias:Atrial Fibrillation; Risk                 Factors:Hypertension, Diabetes and Dyslipidemia.  Sonographer:    Raquel Sarna Senior RDCS Referring Phys: [place]  Sonographer Comments: Technically difficult due to poor echo windows. IMPRESSIONS  1. Hypokinesis of the basal/mid inferolateral wall with overall normal LV function; grade 1 diastolic dysfunction.  2. Left ventricular ejection fraction, by estimation, is 55 to 60%. The left ventricle has normal function. The left ventricle demonstrates regional wall motion abnormalities (see scoring diagram/findings for description). Left ventricular diastolic parameters are consistent with Grade I diastolic dysfunction (impaired relaxation).  3. Right ventricular systolic function is normal. The right ventricular size is normal.  4. The mitral valve is normal in structure. No evidence of mitral valve regurgitation. No evidence of mitral stenosis.  5. The aortic valve is tricuspid. Aortic valve regurgitation is not visualized. Mild aortic valve sclerosis is present, with no evidence of aortic valve stenosis.  6. The inferior vena cava is normal in size with greater than 50% respiratory  variability, suggesting right atrial pressure of 3 mmHg. FINDINGS  Left Ventricle: Left ventricular ejection fraction, by estimation, is 55 to 60%. The left ventricle has normal function. The left ventricle demonstrates regional wall motion abnormalities. The left ventricular internal cavity size was normal in size. There is no left ventricular hypertrophy. Left ventricular diastolic parameters are consistent with Grade I diastolic dysfunction (impaired relaxation). Right Ventricle: The right ventricular size is normal.Right ventricular systolic function is normal. Left Atrium: Left atrial size was normal in size. Right Atrium: Right atrial size was normal in size. Pericardium: There is no evidence of pericardial effusion. Mitral Valve: The mitral valve is normal in structure. Mild mitral annular calcification. No evidence of mitral valve regurgitation. No evidence of mitral valve stenosis. Tricuspid Valve: The tricuspid valve is normal in structure. Tricuspid valve regurgitation is not demonstrated. No evidence of tricuspid stenosis. Aortic Valve: The aortic valve is tricuspid. Aortic valve regurgitation is not visualized. Mild aortic valve sclerosis is present, with no evidence of aortic valve stenosis. Pulmonic Valve: The pulmonic valve was grossly normal. Pulmonic valve regurgitation is not visualized. No evidence of pulmonic stenosis. Aorta: The aortic root is normal in size and structure. Venous: The inferior vena cava is normal in size with greater than 50% respiratory variability, suggesting right atrial pressure of 3 mmHg. IAS/Shunts: No atrial level shunt detected by color flow Doppler. Additional Comments: Hypokinesis of the basal/mid inferolateral wall with overall normal LV function; grade 1 diastolic dysfunction.  LEFT VENTRICLE PLAX 2D LVIDd:         3.90 cm  Diastology LVIDs:         2.80 cm  LV e' medial:    3.92 cm/s LV PW:         1.00 cm  LV E/e' medial:  13.8 LV IVS:  0.90 cm  LV e'  lateral:   5.98 cm/s LVOT diam:     2.30 cm  LV E/e' lateral: 9.0 LV SV:         56 LV SV Index:   28 LVOT Area:     4.15 cm  RIGHT VENTRICLE RV S prime:     8.70 cm/s TAPSE (M-mode): 1.8 cm LEFT ATRIUM             Index LA diam:        3.30 cm 1.67 cm/m LA Vol (A2C):   27.7 ml 14.01 ml/m LA Vol (A4C):   41.4 ml 20.94 ml/m LA Biplane Vol: 35.4 ml 17.91 ml/m  AORTIC VALVE LVOT Vmax:   56.80 cm/s LVOT Vmean:  38.900 cm/s LVOT VTI:    0.134 m  AORTA Ao Root diam: 3.00 cm MITRAL VALVE MV Area (PHT): 3.17 cm    SHUNTS MV Decel Time: 239 msec    Systemic VTI:  0.13 m MV E velocity: 54.00 cm/s  Systemic Diam: 2.30 cm MV A velocity: 87.40 cm/s MV E/A ratio:  0.62 Kirk Ruths MD Electronically signed by Kirk Ruths MD Signature Date/Time: 05/07/2020/11:55:43 AM    Final    VAS US CAROTID (at [facility] and [facility] only)  Result Date: 05/07/2020 Carotid Arterial Duplex Study Indications:       CVA. Risk Factors:      Hypertension. Comparison Study:  No prior studies. Performing Technologist: Oliver Hum RVT  Examination Guidelines: A complete evaluation includes B-mode imaging, spectral Doppler, color Doppler, and power Doppler as needed of all accessible portions of each vessel. Bilateral testing is considered an integral part of a complete examination. Limited examinations for reoccurring indications may be performed as noted.  Right Carotid Findings: +----------+--------+--------+--------+-----------------------+--------+           PSV cm/sEDV cm/sStenosisPlaque Description     Comments +----------+--------+--------+--------+-----------------------+--------+ CCA Prox  48      11                                     tortuous +----------+--------+--------+--------+-----------------------+--------+ CCA Distal44      7               smooth and heterogenous         +----------+--------+--------+--------+-----------------------+--------+ ICA Prox  42      10              smooth and  heterogenoustortuous +----------+--------+--------+--------+-----------------------+--------+ ICA Distal44      14                                              +----------+--------+--------+--------+-----------------------+--------+ ECA       93      12                                              +----------+--------+--------+--------+-----------------------+--------+ +----------+--------+-------+--------+-------------------+           PSV cm/sEDV cmsDescribeArm Pressure (mmHG) +----------+--------+-------+--------+-------------------+ [id number]                                         +----------+--------+-------+--------+-------------------+ +---------+--------+--+--------+-+---------+  VertebralPSV cm/s28EDV cm/s8Antegrade +---------+--------+--+--------+-+---------+  Left Carotid Findings: +----------+--------+-------+--------+--------------------------------+--------+           PSV cm/sEDV    StenosisPlaque Description              Comments                   cm/s                                                    +----------+--------+-------+--------+--------------------------------+--------+ CCA Prox  49      10             smooth and heterogenous                  +----------+--------+-------+--------+--------------------------------+--------+ CCA Distal48      13             smooth and heterogenous                  +----------+--------+-------+--------+--------------------------------+--------+ ICA Prox  35      13             smooth, heterogenous and                                                  calcific                                 +----------+--------+-------+--------+--------------------------------+--------+ ICA Distal43      17                                             tortuous +----------+--------+-------+--------+--------------------------------+--------+ ECA       163     19                                                       +----------+--------+-------+--------+--------------------------------+--------+ +----------+--------+--------+--------+-------------------+           PSV cm/sEDV cm/sDescribeArm Pressure (mmHG) +----------+--------+--------+--------+-------------------+ [id number]                                          +----------+--------+--------+--------+-------------------+ +---------+--------+--+--------+-+---------+ VertebralPSV cm/s41EDV cm/s7Antegrade +---------+--------+--+--------+-+---------+   Summary: Right Carotid: Velocities in the right ICA are consistent with a 1-39% stenosis. Left Carotid: Velocities in the left ICA are consistent with a 1-39% stenosis. Vertebrals: Bilateral vertebral arteries demonstrate antegrade flow. *See table(s) above for measurements and observations.  Electronically signed by Antony Contras MD on 05/07/2020 at 2:34:04 PM.    Final         Scheduled Meds: .  stroke: mapping our early stages of recovery book   Does not apply Once  . aspirin  300 mg Rectal Daily   Or  . aspirin  325 mg Oral Daily  . atorvastatin  80 mg  Oral Daily  . fenofibrate  54 mg Oral Daily  . heparin injection (subcutaneous)  5,000 Units Subcutaneous Q8H  . insulin aspart  0-9 Units Subcutaneous Q4H   Continuous Infusions: . sodium chloride 75 mL/hr at 05/07/20 0939     LOS: 1 day    Time spent: 35 minutes    Irine Seal, MD [facility]   To contact the attending provider between 7A-7P or the covering provider during after hours 7P-7A, please log into the web site [email or web address] and access using universal [place] password for that web site. If you do not have the password, please call the hospital operator.  05/07/2020, 3:03 PM

## 2020-05-07 NOTE — Consult Note (Signed)
NEUROLOGY CONSULTATION NOTE   Date of service: May 07, 2020 Patient Name: Brendan Long MRN:  [id number] DOB:  13-May-1929 Reason for consult: "stroke on MRI"  History of Present Illness  Rakeen Gaillard is a 86 y.o. male with PMH significant for Erectile dysfunction, HLD, HTN, Hypothyroidism, Psoriasis, Afibb s/p ablation 14 yrs ago and not on AC, COPD, Prediabetes who was sent in for stroke workup for embolic appearing infarcts on an outpatient MRI Brain at the [place]. I am unable to see the images myself. He does not have any neurological deficit.  Per report of the MRI scanned into the chart, restricted diffusion in the dorsal left putamen.  Restricted diffusion in the left subinsular region.  Stable appearance of old right occipital lobe infarct.  Old left caudate body infarct.  Lacunar infarct in the paramedian line right pons is new since prior year but not acute.  Multiple bilateral cerebellar infarcts are new since prior but not acute.  Moderate brain volume loss and chronic microvascular ischemic disease.  No mass-effect, hemorrhage or hydrocephalus.  Grossly normal flow related signal in the major intracranial arteries and dural sinuses.  Work-up with echocardiogram demonstrates hypokinesis of the basal/mid inferior lateral wall with normal LV function.  Left ventricular ejection fraction of 55 to 60%.  Labs with hemoglobin A1c of 6.0 and LDL of 132.  A carotid Doppler with no significant stenosis.  No intracranial vessel imaging is completed.  He denies any arm or leg weakness or numbness. Endorses long standing L shoulder weakness that he attributes to an injury about 5 years ago to his R rotator cuff. He does not have any pain associated with this weakness. He takes aspirin every other day. Was a smoker in the 109s but quit about 40 years ago.   ROS   Constitutional Denies weight loss, fever and chills.  HEENT Denies changes in vision and hearing.   Respiratory Denies SOB and  cough.   CV Denies palpitations and CP   GI Denies abdominal pain, nausea, vomiting and diarrhea.   GU Denies dysuria and urinary frequency.   MSK Denies myalgia and joint pain.   Skin Denies rash and pruritus.   Neurological Denies headache and syncope.   Psychiatric Denies recent changes in mood. Denies anxiety and depression.    Past History   Past Medical History:  Diagnosis Date  . ED (erectile dysfunction)   . Hyperlipidemia   . Hypertension   . Hypothyroidism   . Psoriasis   . Varicose veins   . Vitamin D deficiency    Past Surgical History:  Procedure Laterality Date  . JOINT REPLACEMENT    . joint replacement rt and left    . rotator cuff repair, right shoulder per Dr. Tonita Cong  1996  . torn right bicepts muscle  1996  . TOTAL HIP ARTHROPLASTY  01/17/10   redone on right hip on 09/05/10   Family History  Problem Relation Age of Onset  . Breast cancer Other    Social History   Socioeconomic History  . Marital status: Widowed    Spouse name: Not on file  . Number of children: Not on file  . Years of education: Not on file  . Highest education level: Not on file  Occupational History  . Not on file  Tobacco Use  . Smoking status: Former Research scientist (life sciences)  . Smokeless tobacco: Never Used  Vaping Use  . Vaping Use: Never used  Substance and Sexual Activity  . Alcohol  use: No  . Drug use: No  . Sexual activity: Yes  Other Topics Concern  . Not on file  Social History Narrative  . Not on file   Social Determinants of Health   Financial Resource Strain:   . Difficulty of Paying Living Expenses: Not on file  Food Insecurity:   . Worried About Charity fundraiser in the Last Year: Not on file  . Ran Out of Food in the Last Year: Not on file  Transportation Needs:   . Lack of Transportation (Medical): Not on file  . Lack of Transportation (Non-Medical): Not on file  Physical Activity:   . Days of Exercise per Week: Not on file  . Minutes of Exercise per Session:  Not on file  Stress:   . Feeling of Stress : Not on file  Social Connections:   . Frequency of Communication with Friends and Family: Not on file  . Frequency of Social Gatherings with Friends and Family: Not on file  . Attends Religious Services: Not on file  . Active Member of Clubs or Organizations: Not on file  . Attends Archivist Meetings: Not on file  . Marital Status: Not on file   No Known Allergies  Medications   Medications Prior to Admission  Medication Sig Dispense Refill Last Dose  . aspirin 325 MG tablet Take 325 mg by mouth 2 (two) times daily.    05/06/2020 at Unknown time  . atorvastatin (LIPITOR) 80 MG tablet TAKE 1 TABLET DAILY 90 tablet 2 05/06/2020 at Unknown time  . clobetasol ointment (TEMOVATE) 0.05 % APPLY TOPICALLY TWICE A DAY (Patient taking differently: Apply 1 application topically 2 (two) times daily as needed (rash). ) 30 g 23 unknown  . diclofenac sodium (VOLTAREN) 1 % GEL APPLY 4 GRAMS TOPICALLY FOUR TIMES A DAY AS NEEDED FOR KNEE PAIN (Patient taking differently: Apply 4 g topically 4 (four) times daily as needed (pain). ) 200 g 28 unknown  . metoprolol tartrate (LOPRESSOR) 50 MG tablet Take 1 tablet (50 mg total) by mouth 2 (two) times daily. 180 tablet 1 05/06/2020 at 0700  . TRICOR 48 MG tablet TAKE 1 TABLET DAILY 90 tablet 3 05/06/2020 at Unknown time  . amLODipine (NORVASC) 10 MG tablet Take 1 tablet (10 mg total) by mouth daily. (Patient not taking: Reported on 05/06/2020) 30 tablet 0 Not Taking at Unknown time  . budesonide-formoterol (SYMBICORT) 80-4.5 MCG/ACT inhaler Inhale 2 puffs into the lungs 2 (two) times daily. (Patient not taking: Reported on 05/06/2020) 1 Inhaler 3 Completed Course at Unknown time  . HYDROcodone-Acetaminophen 5-300 MG TABS Take 1 tablet by mouth every 6 (six) hours as needed. (Patient not taking: Reported on 05/06/2020) 30 tablet 0 Not Taking at Unknown time  . omega-3 acid ethyl esters (LOVAZA) 1 G capsule Take 2  capsules (2 g total) by mouth 2 (two) times daily. 120 capsule 11   . polyethylene glycol (MIRALAX / GLYCOLAX) 17 g packet Take 17 g by mouth daily as needed for severe constipation. (Patient not taking: Reported on 05/06/2020) 14 each 0 Completed Course at Unknown time  . sildenafil (VIAGRA) 100 MG tablet TAKE 1 TABLET AS NEEDED ONE HOUR BEFORE NEEDED FOR ERECTILE DYSFUNCTION 10 tablet 2 unknown  . Vitamin D, Ergocalciferol, (DRISDOL) 1.25 MG (50000 UNIT) CAPS capsule TAKE 1 CAPSULE EVERY 7 DAYS FOR 12 DOSES (Patient not taking: Reported on 05/06/2020) 12 capsule 0 Not Taking at Unknown time     Vitals  Vitals:   05/07/20 1039 05/07/20 1139 05/07/20 1215 05/07/20 1652  BP: (!) 158/105 (!) 167/98 (!) 168/104 (!) 162/108  Pulse: 68 72 73 72  Resp: 16 15 18 18   Temp:   97.8 F (36.6 C) 97.6 F (36.4 C)  TempSrc:   Oral Oral  SpO2: 98% 100% 99% 98%  Weight:      Height:         Body mass index is 25.25 kg/m.  Physical Exam   General: Laying comfortably in bed; in no acute distress.  HENT: Normal oropharynx and mucosa. Normal external appearance of ears and nose.  Neck: Supple, no pain or tenderness  CV: No JVD. No peripheral edema.  Pulmonary: Symmetric Chest rise. Normal respiratory effort.  Abdomen: Soft to touch, non-tender.  Ext: No cyanosis, edema, or deformity  Skin: No rash. Normal palpation of skin.   Musculoskeletal: Normal digits and nails by inspection. No clubbing.   Neurologic Examination  Mental status/Cognition: Alert, oriented to self, place, month and year, good attention.  Speech/language: Fluent, comprehension intact, object naming intact, repetition intact.  Cranial nerves:   CN II Pupils equal and reactive to light, no VF deficits    CN III,IV,VI EOM intact, no gaze preference or deviation, no nystagmus    CN V normal sensation in V1, V2, and V3 segments bilaterally    CN VII no asymmetry, no nasolabial fold flattening    CN VIII normal hearing to speech     CN IX & X normal palatal elevation, no uvular deviation    CN XI 5/5 head turn and 5/5 shoulder shrug bilaterally    CN XII midline tongue protrusion    Motor:  Muscle bulk: normal, tone normal, pronator drift none Mvmt Root Nerve  Muscle Right Left Comments  SA C5/6 Ax Deltoid 5 4   EF C5/6 Mc Biceps 5 5   EE C6/7/8 Rad Triceps 5 5   WF C6/7 Med FCR 5 5   WE C7/8 PIN ECU 5 5   F Ab C8/T1 U ADM/FDI 5 5   HF L1/2/3 Fem Illopsoas 5 5   KE L2/3/4 Fem Quad 5 5   DF L4/5 D Peron Tib Ant 5 5   PF S1/2 Tibial Grc/Sol 5 5    Reflexes:  Right Left Comments  Pectoralis      Biceps (C5/6) 1 1   Brachioradialis (C5/6) 1 1    Triceps (C6/7) 1 1    Patellar (L3/4) 1 1    Achilles (S1) 1 1    Hoffman      Plantar     Jaw jerk    Sensation:  Light touch Intact throughout   Pin prick    Temperature    Vibration   Proprioception    Coordination/Complex Motor:  - Finger to Nose intact BL - Heel to shin intact BL. - Rapid alternating movements are normal.  Labs   CBC:  Recent Labs  Lab 05/06/20 1645 05/07/20 0941  WBC 9.9 7.1  NEUTROABS 5.1 3.8  HGB 15.9 14.5  HCT 46.6 43.5  MCV 93.8 94.2  PLT 227 809    Basic Metabolic Panel:  Lab Results  Component Value Date   NA 139 05/07/2020   K 3.6 05/07/2020   CO2 25 05/07/2020   GLUCOSE 95 05/07/2020   BUN 30 (H) 05/07/2020   CREATININE 1.78 (H) 05/07/2020   CALCIUM 9.0 05/07/2020   GFRNONAA 33 (L) 05/07/2020   GFRAA 38 (L) 05/07/2020  Lipid Panel:  Lab Results  Component Value Date   LDLCALC 132 (H) 05/07/2020   HgbA1c:  Lab Results  Component Value Date   HGBA1C 6.0 (H) 05/07/2020   Urine Drug Screen:     Component Value Date/Time   LABOPIA NONE DETECTED 03/18/2019 1935   COCAINSCRNUR NONE DETECTED 03/18/2019 1935   LABBENZ NONE DETECTED 03/18/2019 1935   AMPHETMU NONE DETECTED 03/18/2019 1935   THCU NONE DETECTED 03/18/2019 1935   LABBARB NONE DETECTED 03/18/2019 1935    Alcohol Level      Component Value Date/Time   ETH <10 03/18/2019 1528     Imaging and Diagnostic studies  DG Chest 2 View  IMPRESSION: No active cardiopulmonary disease.  ECHOCARDIOGRAM COMPLETE  IMPRESSIONS  1. Hypokinesis of the basal/mid inferolateral wall with overall normal LV function; grade 1 diastolic dysfunction.  2. Left ventricular ejection fraction, by estimation, is 55 to 60%. The left ventricle has normal function. The left ventricle demonstrates regional wall motion abnormalities (see scoring diagram/findings for description). Left ventricular diastolic parameters are consistent with Grade I diastolic dysfunction (impaired relaxation).  3. Right ventricular systolic function is normal. The right ventricular size is normal.  4. The mitral valve is normal in structure. No evidence of mitral valve regurgitation. No evidence of mitral stenosis.  5. The aortic valve is tricuspid. Aortic valve regurgitation is not visualized. Mild aortic valve sclerosis is present, with no evidence of aortic valve stenosis.  6. The inferior vena cava is normal in size with greater than 50% respiratory variability, suggesting right atrial pressure of 3 mmHg.  VAS US CAROTID (at [facility] and [facility] only) Right Carotid: Velocities in the right ICA are consistent with a 1-39% stenosis. Left Carotid: Velocities in the left ICA are consistent with a 1-39% stenosis. Vertebrals: Bilateral vertebral arteries demonstrate antegrade flow. *See table(s) above for measurements and observations.  Impression   Keval Nam is a 86 y.o. male with PMH significant for Erectile dysfunction, HLD, HTN, Hypothyroidism, Psoriasis, Afibb s/p ablation 14 yrs ago and not on AC, COPD, Prediabetes who was sent in for stroke workup for embolic appearing infarcts on an outpatient MRI Brain at the [place]. Unable to review MRI Brain but a picture of the report is documented in notes and is concerning for embolic infarcts of various age. Workup is still pending  but so far not revealing. Will get an MR Angio head without contrast to complete the workup. May benefit from further workup with TEE and a loop recorder.  Recommendations  - MR Angio head without contrast is pending. - Neuro checks per stroke unit protocol - Antithrombotic - Continue Aspirin 81mg  daily. - Recommend DVT ppx - SBP goal - gradual normotension - Recommend Telemetry monitoring for arrythmia - Recommend bedside swallow screen prior to PO intake. - Stroke education booklet - Recommend PT/OT/SLP consult ______________________________________________________________________   Thank you for the opportunity to take part in the care of this patient. If you have any further questions, please contact the neurology consultation attending.  Signed,  [place] Pager Number [phone]

## 2020-05-07 NOTE — ED Notes (Signed)
Pt. Documented in error see above note in chart. 

## 2020-05-07 NOTE — Evaluation (Signed)
Physical Therapy Evaluation Patient Details Name: Brendan Long MRN: [id number] DOB: [birth year] Today's Date: 05/07/2020   History of Present Illness  Patient is a pleasant 86 year old gentleman history of hypertension, hyperlipidemia, hypothyroidism, A. 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  Patient denies any neurological deficits.  Patient admitted for stroke work-up.  Clinical Impression  Brendan Long is an  86 year old man with the above medical history who presents supine in bed and without report of deficits. Patient demonstrated ability to perform bed transfers, sit to stand, ambulation in hall (including fwd, bkwd and side-ways) and ADLs. Patient exhibits normal ROM, strength and coordination of upper extremities. Patient demonstrated functional visual perception and motor control during evaluation. With peripheral vision testing patient exhibited a mild difference in when he located stimuli. Patient able to locate fingers in all four quadrants, read items on both sides of the room, locate items on the sink, negotiate hallway environment, and locate his room from the hallway. No PT needs at this time.    Follow Up Recommendations No PT follow up    Equipment Recommendations  None recommended by PT    Recommendations for Other Services       Precautions / Restrictions Precautions Precautions: None Restrictions Weight Bearing Restrictions: No      Mobility  Bed Mobility Overal bed mobility: Independent                Transfers Overall transfer level: Independent Equipment used: None             General transfer comment: pt moved sit<>stand without use of UEs  Ambulation/Gait Ambulation/Gait assistance: Supervision;Independent Gait Distance (Feet): 300  Feet Assistive device: None Gait Pattern/deviations: WFL(Within Functional Limits) Gait velocity: mod pace   General Gait Details: min instability and no LOB - Pt states feels at base line  Stairs            Wheelchair Mobility    Modified Rankin (Stroke Patients Only)       Balance Overall balance assessment: Independent                                           Pertinent Vitals/Pain Pain Assessment: No/denies pain    Home Living Family/patient expects to be discharged to:: Private residence Living Arrangements: Alone Available Help at Discharge: Friend(s);Available PRN/intermittently Type of Home: House Home Access: Level entry     Home Layout: One level Home Equipment: Walker - 2 wheels Additional Comments: Patient drives and cooks for himself.    Prior Function Level of Independence: Independent               Hand Dominance   Dominant Hand: Right    Extremity/Trunk Assessment   Upper Extremity Assessment Upper Extremity Assessment: Defer to OT evaluation RUE Deficits / Details: 5/5 ROM and strength RUE Sensation: WNL RUE Coordination: WNL LUE Deficits / Details: Decreased strength in left shoulder (chronic injury) otherwise 5/5 LUE: Unable to fully assess due to pain LUE Sensation: WNL    Lower Extremity Assessment Lower Extremity Assessment: Overall WFL for tasks assessed    Cervical / Trunk Assessment Cervical / Trunk Assessment: Normal  Communication   Communication: No difficulties  Cognition  Arousal/Alertness: Awake/alert Behavior During Therapy: WFL for tasks assessed/performed Overall Cognitive Status: Within Functional Limits for tasks assessed                                        General Comments      Exercises     Assessment/Plan    PT Assessment Patent does not need any further PT services  PT Problem List         PT Treatment Interventions      PT Goals (Current goals can  be found in the Care Plan section)  Acute Rehab PT Goals Patient Stated Goal: Did not state PT Goal Formulation: All assessment and education complete, DC therapy    Frequency     Barriers to discharge        Co-evaluation               AM-PAC PT "6 Clicks" Mobility  Outcome Measure Help needed turning from your back to your side while in a flat bed without using bedrails?: None Help needed moving from lying on your back to sitting on the side of a flat bed without using bedrails?: None Help needed moving to and from a bed to a chair (including a wheelchair)?: None Help needed standing up from a chair using your arms (e.g., wheelchair or bedside chair)?: None Help needed to walk in hospital room?: None Help needed climbing 3-5 steps with a railing? : None 6 Click Score: 24    End of Session Equipment Utilized During Treatment: Gait belt Activity Tolerance: Patient tolerated treatment well Patient left: in chair;with call bell/phone within reach;with chair alarm set Nurse Communication: Mobility status PT Visit Diagnosis: Other symptoms and signs involving the nervous system (R29.898)    Time: 0630-1601 PT Time Calculation (min) (ACUTE ONLY): 26 min   Charges:   PT Evaluation $PT Eval Low Complexity: 1 Low          [place] Pager [phone] Office [phone]   Oree Hislop 05/07/2020, 5:25 PM

## 2020-05-07 NOTE — Progress Notes (Signed)
Carotid artery duplex has been completed. Preliminary results can be found in CV Proc through chart review.   05/07/20 9:37 AM Carlos Levering RVT

## 2020-05-07 NOTE — Consult Note (Signed)
Cardiology Consultation:   Patient ID: Brendan Long MRN: [id number]; DOB: July 17, 1929  Admit date: 05/06/2020 Date of Consult: 05/07/2020  Primary Care Provider: Isaac Bliss, Rayford Halsted, MD [facility] Cardiologist: [place] Electrophysiologist:  None    Patient Profile:   Brendan Long is a 86 y.o. male with a hx of HTN, HLD, SVT in 2002, ED, hypothyroidism, psoriasis, varicose veins, afib s/p ablation 15 years ago not on a/c, prediabetes, COPD, history of elevated PSA, CKD stage III, chronic RBBB who is being seen today for the evaluation of abnormal echo at the request of Dr. Grandville Silos.  History of Present Illness:   Brendan Long is followed by the [facility]. e has remotely been seen by Dr. Lovena Le in the past. He has a longstanding history of paroxysmal afib s/p ablation 15 years ago and not on anticoagulation. Also has a history of SVT in 2002 placed on BB. He was seen by Dr. Lovena Le during an admission in 2012 for recurrent SVT and BB was up-titrated. No known history of CAD, MI, stent, cath , or stress test. In 03/2019 he was admitted for dizziness. Work-up negative for stroke. Echo showed LVEF 55-60%, impaired relaxation. PTA patient was taking atorvastatin 80 mg, lopressor 50 mg daily.   The patient presented to the ER for abnormal MRI results. He had cataract surgery 1 year ago and since then has struggled with neck pain. Denies neurological symptoms or changes. No numbness, weakness, slurred speech, or double vision. He had an MRI head earlier today day results concerning for CVA and he was sent to the ED.  In the ER B/P 153/95, pulse 62, afebrile, RR 13, 99% O2. Labs showed potassium 3.7, creatinine 1.97, BUN 38, Mag 2.2, WBC 9.9, Hgb 15.9. Respiratory panel negative. CXR unremarkable. EKG shows NSR with RBBB, 61 bpm, nonspecific ST changes.    Past Medical History:  Diagnosis Date  . ED (erectile dysfunction)   . Hyperlipidemia   . Hypertension   .  Hypothyroidism   . Psoriasis   . Varicose veins   . Vitamin D deficiency     Past Surgical History:  Procedure Laterality Date  . JOINT REPLACEMENT    . joint replacement rt and left    . rotator cuff repair, right shoulder per Dr. Tonita Cong  1996  . torn right bicepts muscle  1996  . TOTAL HIP ARTHROPLASTY  01/17/10   redone on right hip on 09/05/10     Home Medications:  Prior to Admission medications   Medication Sig Start Date End Date Taking? Authorizing Provider  aspirin 325 MG tablet Take 325 mg by mouth 2 (two) times daily.    Yes [provider]  atorvastatin (LIPITOR) 80 MG tablet TAKE 1 TABLET DAILY 07/24/17  Yes Renato Shin, MD  clobetasol ointment (TEMOVATE) 0.05 % APPLY TOPICALLY TWICE A DAY Patient taking differently: Apply 1 application topically 2 (two) times daily as needed (rash).  10/21/18  Yes Isaac Bliss, Rayford Halsted, MD  diclofenac sodium (VOLTAREN) 1 % GEL APPLY 4 GRAMS TOPICALLY FOUR TIMES A DAY AS NEEDED FOR KNEE PAIN Patient taking differently: Apply 4 g topically 4 (four) times daily as needed (pain).  04/22/18  Yes Renato Shin, MD  metoprolol tartrate (LOPRESSOR) 50 MG tablet Take 1 tablet (50 mg total) by mouth 2 (two) times daily. 02/11/20  Yes Isaac Bliss, Rayford Halsted, MD  TRICOR 48 MG tablet TAKE 1 TABLET DAILY 07/15/19  Yes Isaac Bliss, Rayford Halsted, MD  amLODipine (  NORVASC) 10 MG tablet Take 1 tablet (10 mg total) by mouth daily. Patient not taking: Reported on 05/06/2020 03/22/19   Monica Becton, MD  budesonide-formoterol [facility]) 80-4.5 MCG/ACT inhaler Inhale 2 puffs into the lungs 2 (two) times daily. Patient not taking: Reported on 05/06/2020 12/03/17   Renato Shin, MD  HYDROcodone-Acetaminophen 5-300 MG TABS Take 1 tablet by mouth every 6 (six) hours as needed. Patient not taking: Reported on 05/06/2020 12/05/19   Isaac Bliss, Rayford Halsted, MD  omega-3 acid ethyl esters (LOVAZA) 1 G capsule Take 2 capsules (2 g total) by mouth 2  (two) times daily. 03/11/13 03/11/14  Renato Shin, MD  polyethylene glycol (MIRALAX / GLYCOLAX) 17 g packet Take 17 g by mouth daily as needed for severe constipation. Patient not taking: Reported on 05/06/2020 03/21/19   Monica Becton, MD  sildenafil (VIAGRA) 100 MG tablet TAKE 1 TABLET AS NEEDED ONE HOUR BEFORE NEEDED FOR ERECTILE DYSFUNCTION 09/23/19   Isaac Bliss, Rayford Halsted, MD  Vitamin D, Ergocalciferol, (DRISDOL) 1.25 MG (50000 UNIT) CAPS capsule TAKE 1 CAPSULE EVERY 7 DAYS FOR 12 DOSES Patient not taking: Reported on 05/06/2020 02/13/20   Isaac Bliss, Rayford Halsted, MD    Inpatient Medications: Scheduled Meds: .  stroke: mapping our early stages of recovery book   Does not apply Once  . aspirin  300 mg Rectal Daily   Or  . aspirin  325 mg Oral Daily  . atorvastatin  80 mg Oral Daily  . fenofibrate  54 mg Oral Daily  . insulin aspart  0-9 Units Subcutaneous Q4H   Continuous Infusions: . sodium chloride 75 mL/hr at 05/07/20 0939   PRN Meds: acetaminophen **OR** acetaminophen (TYLENOL) oral liquid 160 mg/5 mL **OR** acetaminophen  Allergies:   No Known Allergies  Social History:   Social History   Socioeconomic History  . Marital status: Widowed    Spouse name: Not on file  . Number of children: Not on file  . Years of education: Not on file  . Highest education level: Not on file  Occupational History  . Not on file  Tobacco Use  . Smoking status: Former Research scientist (life sciences)  . Smokeless tobacco: Never Used  Vaping Use  . Vaping Use: Never used  Substance and Sexual Activity  . Alcohol use: No  . Drug use: No  . Sexual activity: Yes  Other Topics Concern  . Not on file  Social History Narrative  . Not on file   Social Determinants of Health   Financial Resource Strain:   . Difficulty of Paying Living Expenses: Not on file  Food Insecurity:   . Worried About Charity fundraiser in the Last Year: Not on file  . Ran Out of Food in the Last Year: Not on file    Transportation Needs:   . Lack of Transportation (Medical): Not on file  . Lack of Transportation (Non-Medical): Not on file  Physical Activity:   . Days of Exercise per Week: Not on file  . Minutes of Exercise per Session: Not on file  Stress:   . Feeling of Stress : Not on file  Social Connections:   . Frequency of Communication with Friends and Family: Not on file  . Frequency of Social Gatherings with Friends and Family: Not on file  . Attends Religious Services: Not on file  . Active Member of Clubs or Organizations: Not on file  . Attends Archivist Meetings: Not on file  . Marital Status: Not on  file  Intimate Partner Violence:   . Fear of Current or Ex-Partner: Not on file  . Emotionally Abused: Not on file  . Physically Abused: Not on file  . Sexually Abused: Not on file    Family History:   Family History  Problem Relation Age of Onset  . Breast cancer Other      ROS:  Please see the history of present illness.  All other ROS reviewed and negative.     Physical Exam/Data:   Vitals:   05/07/20 1002 05/07/20 1039 05/07/20 1139 05/07/20 1215  BP: (!) 167/103 (!) 158/105 (!) 167/98 (!) 168/104  Pulse: 69 68 72 73  Resp: 17 16 15 18   Temp:    97.8 F (36.6 C)  TempSrc:    Oral  SpO2: 98% 98% 100% 99%  Weight:      Height:       No intake or output data in the 24 hours ending 05/07/20 1420 Last 3 Weights 05/06/2020 02/11/2020 12/19/2019  Weight (lbs) 176 lb 176 lb 14.4 oz 177 lb 3.2 oz  Weight (kg) 79.833 kg 80.241 kg 80.377 kg     Body mass index is 25.25 kg/m.  General:  Well nourished, well developed, in no acute distress HEENT: normal Neck: no JVD Cardiac:  normal S1, S2; RRR; no murmur  Lungs:  clear to auscultation bilaterally, no wheezing, rhonchi or rales  Abd: soft, nontender, no hepatomegaly  Ext: no edema Musculoskeletal:  No deformities, BUE and BLE strength normal and equal Skin: warm and dry  Neuro:  no focal abnormalities  noted Psych:  Normal affect   EKG:  The EKG was personally reviewed and demonstrates:  NSR, 1st degree AV block, RBBB, RAD, 71 bpm Telemetry:  Telemetry was personally reviewed and demonstrates: Normal sinus rhythm, rate 70s  Relevant CV Studies:  Echo 05/07/20 1. Hypokinesis of the basal/mid inferolateral wall with overall normal LV  function; grade 1 diastolic dysfunction.  2. Left ventricular ejection fraction, by estimation, is 55 to 60%. The  left ventricle has normal function. The left ventricle demonstrates  regional wall motion abnormalities (see scoring diagram/findings for  description). Left ventricular diastolic  parameters are consistent with Grade I diastolic dysfunction (impaired  relaxation).  3. Right ventricular systolic function is normal. The right ventricular  size is normal.  4. The mitral valve is normal in structure. No evidence of mitral valve  regurgitation. No evidence of mitral stenosis.  5. The aortic valve is tricuspid. Aortic valve regurgitation is not  visualized. Mild aortic valve sclerosis is present, with no evidence of  aortic valve stenosis.  6. The inferior vena cava is normal in size with greater than 50%  respiratory variability, suggesting right atrial pressure of 3 mmHg.    Laboratory Data:  High Sensitivity Troponin:   Recent Labs  Lab 05/06/20 1645 05/06/20 1900  TROPONINIHS 8 9     Chemistry Recent Labs  Lab 05/06/20 1645 05/07/20 0941  NA 137 139  K 3.7 3.6  CL 101 102  CO2 28 25  GLUCOSE 79 95  BUN 38* 30*  CREATININE 1.97* 1.78*  CALCIUM 9.6 9.0  GFRNONAA 29* 33*  GFRAA 34* 38*  ANIONGAP 8 12    No results for input(s): PROT, ALBUMIN, AST, ALT, ALKPHOS, BILITOT in the last 168 hours. Hematology Recent Labs  Lab 05/06/20 1645 05/07/20 0941  WBC 9.9 7.1  RBC 4.97 4.62  HGB 15.9 14.5  HCT 46.6 43.5  MCV 93.8 94.2  MCH 32.0 31.4  MCHC 34.1 33.3  RDW 13.8 13.6  PLT 227 181   BNPNo results for  input(s): BNP, PROBNP in the last 168 hours.  DDimer No results for input(s): DDIMER in the last 168 hours.   Radiology/Studies:  DG Chest 2 View  Result Date: 05/06/2020 CLINICAL DATA:  Stroke EXAM: CHEST - 2 VIEW COMPARISON:  11/21/2017 FINDINGS: No focal opacity or pleural effusion. Stable cardiomediastinal silhouette with aortic atherosclerosis. No pneumothorax. Postsurgical changes of the right humeral head IMPRESSION: No active cardiopulmonary disease. Electronically Signed   By: Donavan Foil M.D.   On: 05/06/2020 20:36   ECHOCARDIOGRAM COMPLETE  Result Date: 05/07/2020    ECHOCARDIOGRAM REPORT   Patient Name:   Brendan Long [facility] Date of Exam: 05/07/2020 Medical Rec #:  [id number]          Height:       70.0 in Accession #:    [id number]         Weight:       176.0 lb Date of Birth:  02-04-1929          BSA:          1.977 m Patient Age:    43 years           BP:           160/97 mmHg Patient Gender: M                  HR:           70 bpm. Exam Location:  Inpatient Procedure: 2D Echo, Color Doppler and Cardiac Doppler Indications:    Stroke i163.9  History:        Patient has prior history of Echocardiogram examinations, most                 recent 03/19/2019. Arrythmias:Atrial Fibrillation; Risk                 Factors:Hypertension, Diabetes and Dyslipidemia.  Sonographer:    Raquel Sarna Senior RDCS Referring Phys: [place]  Sonographer Comments: Technically difficult due to poor echo windows. IMPRESSIONS  1. Hypokinesis of the basal/mid inferolateral wall with overall normal LV function; grade 1 diastolic dysfunction.  2. Left ventricular ejection fraction, by estimation, is 55 to 60%. The left ventricle has normal function. The left ventricle demonstrates regional wall motion abnormalities (see scoring diagram/findings for description). Left ventricular diastolic parameters are consistent with Grade I diastolic dysfunction (impaired relaxation).  3. Right ventricular systolic function is  normal. The right ventricular size is normal.  4. The mitral valve is normal in structure. No evidence of mitral valve regurgitation. No evidence of mitral stenosis.  5. The aortic valve is tricuspid. Aortic valve regurgitation is not visualized. Mild aortic valve sclerosis is present, with no evidence of aortic valve stenosis.  6. The inferior vena cava is normal in size with greater than 50% respiratory variability, suggesting right atrial pressure of 3 mmHg. FINDINGS  Left Ventricle: Left ventricular ejection fraction, by estimation, is 55 to 60%. The left ventricle has normal function. The left ventricle demonstrates regional wall motion abnormalities. The left ventricular internal cavity size was normal in size. There is no left ventricular hypertrophy. Left ventricular diastolic parameters are consistent with Grade I diastolic dysfunction (impaired relaxation). Right Ventricle: The right ventricular size is normal.Right ventricular systolic function is normal. Left Atrium: Left atrial size was normal in size. Right Atrium: Right atrial size was normal in size.  Pericardium: There is no evidence of pericardial effusion. Mitral Valve: The mitral valve is normal in structure. Mild mitral annular calcification. No evidence of mitral valve regurgitation. No evidence of mitral valve stenosis. Tricuspid Valve: The tricuspid valve is normal in structure. Tricuspid valve regurgitation is not demonstrated. No evidence of tricuspid stenosis. Aortic Valve: The aortic valve is tricuspid. Aortic valve regurgitation is not visualized. Mild aortic valve sclerosis is present, with no evidence of aortic valve stenosis. Pulmonic Valve: The pulmonic valve was grossly normal. Pulmonic valve regurgitation is not visualized. No evidence of pulmonic stenosis. Aorta: The aortic root is normal in size and structure. Venous: The inferior vena cava is normal in size with greater than 50% respiratory variability, suggesting right atrial  pressure of 3 mmHg. IAS/Shunts: No atrial level shunt detected by color flow Doppler. Additional Comments: Hypokinesis of the basal/mid inferolateral wall with overall normal LV function; grade 1 diastolic dysfunction.  LEFT VENTRICLE PLAX 2D LVIDd:         3.90 cm  Diastology LVIDs:         2.80 cm  LV e' medial:    3.92 cm/s LV PW:         1.00 cm  LV E/e' medial:  13.8 LV IVS:        0.90 cm  LV e' lateral:   5.98 cm/s LVOT diam:     2.30 cm  LV E/e' lateral: 9.0 LV SV:         56 LV SV Index:   28 LVOT Area:     4.15 cm  RIGHT VENTRICLE RV S prime:     8.70 cm/s TAPSE (M-mode): 1.8 cm LEFT ATRIUM             Index LA diam:        3.30 cm 1.67 cm/m LA Vol (A2C):   27.7 ml 14.01 ml/m LA Vol (A4C):   41.4 ml 20.94 ml/m LA Biplane Vol: 35.4 ml 17.91 ml/m  AORTIC VALVE LVOT Vmax:   56.80 cm/s LVOT Vmean:  38.900 cm/s LVOT VTI:    0.134 m  AORTA Ao Root diam: 3.00 cm MITRAL VALVE MV Area (PHT): 3.17 cm    SHUNTS MV Decel Time: 239 msec    Systemic VTI:  0.13 m MV E velocity: 54.00 cm/s  Systemic Diam: 2.30 cm MV A velocity: 87.40 cm/s MV E/A ratio:  0.62 Kirk Ruths MD Electronically signed by Kirk Ruths MD Signature Date/Time: 05/07/2020/11:55:43 AM    Final    VAS US CAROTID (at [facility] and [facility] only)  Result Date: 05/07/2020 Carotid Arterial Duplex Study Indications:       CVA. Risk Factors:      Hypertension. Comparison Study:  No prior studies. Performing Technologist: Oliver Hum RVT  Examination Guidelines: A complete evaluation includes B-mode imaging, spectral Doppler, color Doppler, and power Doppler as needed of all accessible portions of each vessel. Bilateral testing is considered an integral part of a complete examination. Limited examinations for reoccurring indications may be performed as noted.  Right Carotid Findings: +----------+--------+--------+--------+-----------------------+--------+           PSV cm/sEDV cm/sStenosisPlaque Description     Comments  +----------+--------+--------+--------+-----------------------+--------+ CCA Prox  48      11                                     tortuous +----------+--------+--------+--------+-----------------------+--------+ CCA Distal44  7               smooth and heterogenous         +----------+--------+--------+--------+-----------------------+--------+ ICA Prox  42      10              smooth and heterogenoustortuous +----------+--------+--------+--------+-----------------------+--------+ ICA Distal44      14                                              +----------+--------+--------+--------+-----------------------+--------+ ECA       93      12                                              +----------+--------+--------+--------+-----------------------+--------+ +----------+--------+-------+--------+-------------------+           PSV cm/sEDV cmsDescribeArm Pressure (mmHG) +----------+--------+-------+--------+-------------------+ [id number]                                         +----------+--------+-------+--------+-------------------+ +---------+--------+--+--------+-+---------+ VertebralPSV cm/s28EDV cm/s8Antegrade +---------+--------+--+--------+-+---------+  Left Carotid Findings: +----------+--------+-------+--------+--------------------------------+--------+           PSV cm/sEDV    StenosisPlaque Description              Comments                   cm/s                                                    +----------+--------+-------+--------+--------------------------------+--------+ CCA Prox  49      10             smooth and heterogenous                  +----------+--------+-------+--------+--------------------------------+--------+ CCA Distal48      13             smooth and heterogenous                  +----------+--------+-------+--------+--------------------------------+--------+ ICA Prox  35      13              smooth, heterogenous and                                                  calcific                                 +----------+--------+-------+--------+--------------------------------+--------+ ICA Distal43      17                                             tortuous +----------+--------+-------+--------+--------------------------------+--------+ ECA       163     19                                                      +----------+--------+-------+--------+--------------------------------+--------+ +----------+--------+--------+--------+-------------------+  PSV cm/sEDV cm/sDescribeArm Pressure (mmHG) +----------+--------+--------+--------+-------------------+ [id number]                                          +----------+--------+--------+--------+-------------------+ +---------+--------+--+--------+-+---------+ VertebralPSV cm/s41EDV cm/s7Antegrade +---------+--------+--+--------+-+---------+   Summary: Right Carotid: Velocities in the right ICA are consistent with a 1-39% stenosis. Left Carotid: Velocities in the left ICA are consistent with a 1-39% stenosis.  *See table(s) above for measurements and observations.     Preliminary      Assessment and Plan:   CVA - Patient had a cataract surgery 1 year ago and for the last 6 months he was having neck pain. [facility] brain 9/30 which showed acute or early subacute infarct in the left basal ganglia and left subinsular region, multiple interval but nonacute infarcts int her right pons and bilateral cerebellum, table appearance of old right occipital lobe infarct.  No neurologic deficits - Carotid dopplers  negative for significant ICA stenosis - Echo showed basal/mid inferolateral hypokinesis but overall EF 55 to 51%, grade 1 diastolic dysfunction - LDL 132, started on statin - Neurology consulted -Given history of A. fib not on anticoagulation status post ablation, would favor restarting  anticoagulation once OK from stroke perspective  Abnormal echo  - Echo this admission showed LVEF 55-60%, hypokinesis, wall motion abnormalities, G1DD - No known CAD, MI, stent history.  - RF for CAD include HLD, HTN - Patient denies anginal symptoms -No further work-up recommended at this time given EF is preserved and patient is asymptomatic  HLD - LDL 132 and started on statin  HTN - permissive HTN - home lopressor held>>start when able  Afib s/p ablation 15 years ago - patient reports has been in SR, no reoccurrence of afib - not on anticoagulation - CHADSVASC at least 6 (agex2, stroke x2, HTN, DM2)  - continue telemetry -Would favor restarting anticoagulation once able from stroke perspective and consider loop recorder  CKD stage III - creatinine baseline around 1.5-1.7 - creatinine improving with IVF  For questions or updates, please contact [facility] Please consult [email or web address] for contact info under

## 2020-05-07 NOTE — Progress Notes (Signed)
Pt transported by [facility] to [facility] from [facility].

## 2020-05-07 NOTE — Progress Notes (Signed)
Echocardiogram 2D Echocardiogram has been performed.  Oneal Deputy Lilly Gasser 05/07/2020, 8:45 AM

## 2020-05-08 ENCOUNTER — Inpatient Hospital Stay (HOSPITAL_COMMUNITY): Payer: Medicare Other

## 2020-05-08 DIAGNOSIS — I63412 Cerebral infarction due to embolism of left middle cerebral artery: Secondary | ICD-10-CM

## 2020-05-08 DIAGNOSIS — Z8673 Personal history of transient ischemic attack (TIA), and cerebral infarction without residual deficits: Secondary | ICD-10-CM

## 2020-05-08 LAB — GLUCOSE, CAPILLARY
Glucose-Capillary: 98 mg/dL (ref 70–99)
Glucose-Capillary: 78 mg/dL (ref 70–99)
Glucose-Capillary: 106 mg/dL — ABNORMAL HIGH (ref 70–99)

## 2020-05-08 IMAGING — MR MR MRA HEAD W/O CM
1 series · 19 of 48 positions shown · non-contrast
Comparison: None.

CLINICAL DATA: Abnormal MRI at outside institution. Reported left
basal ganglia infarct.

EXAM:
MRA HEAD WITHOUT CONTRAST
TECHNIQUE: Angiographic images of the Circle of Willis were obtained using MRA
technique without intravenous contrast.

[Series 5: 3d cow · axial · 0.5mm · 0.41mm/px · z∈[-109,-14]mm · 19 of 204 slices shown]
[im 1/204]
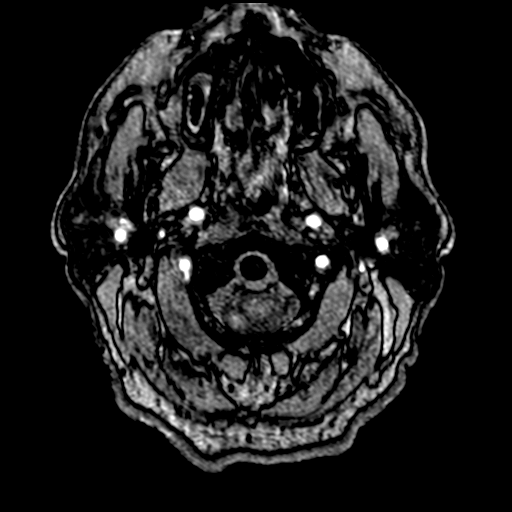
[im 5/204]
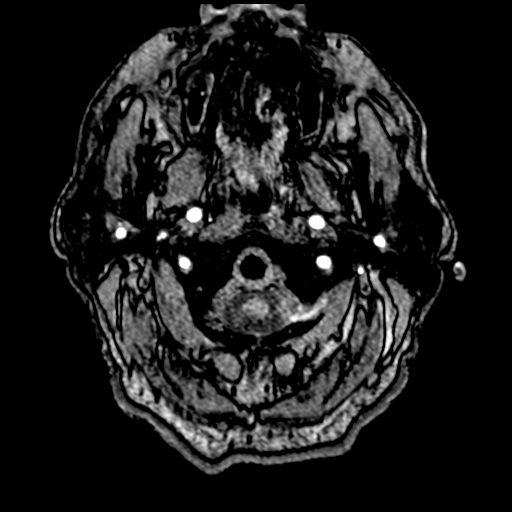
[im 9/204]
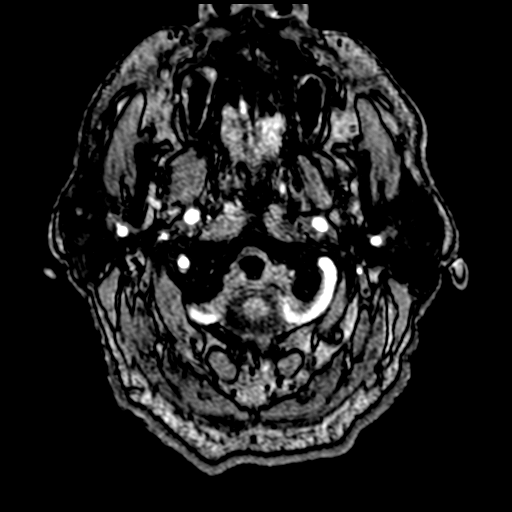
[im 13/204]
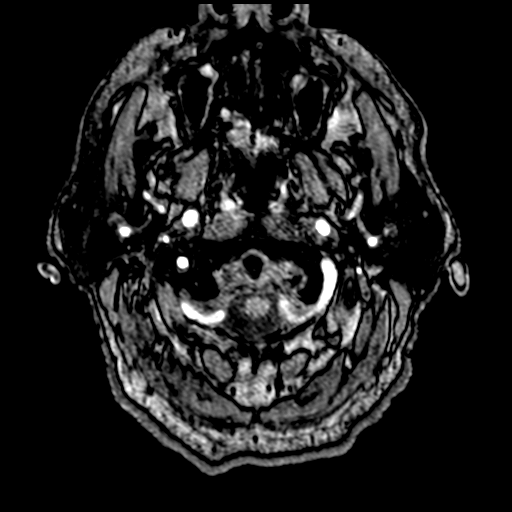
[im 18/204]
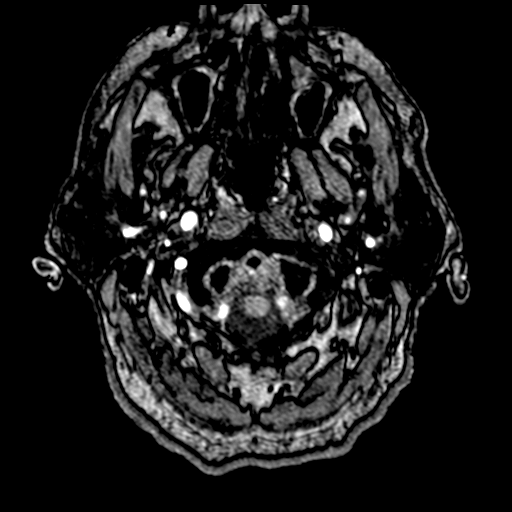
[im 22/204]
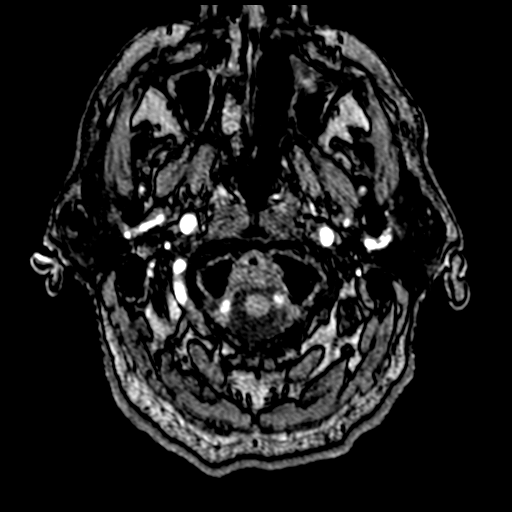
[im 26/204]
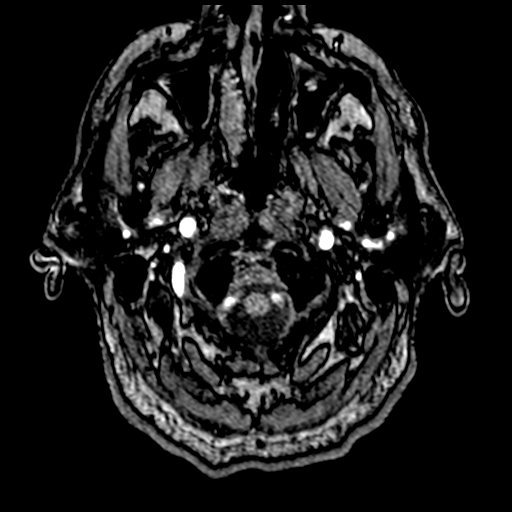
[im 31/204]
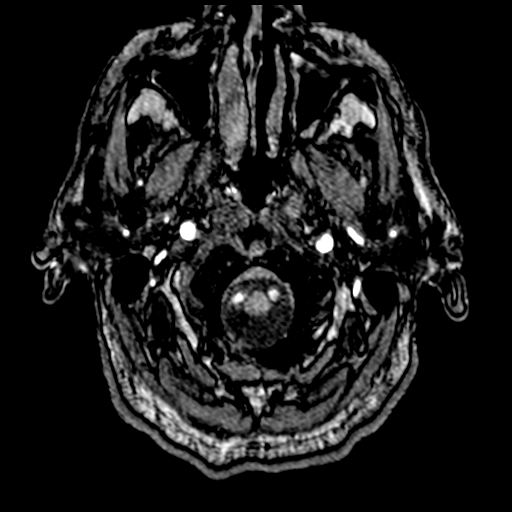
[im 35/204]
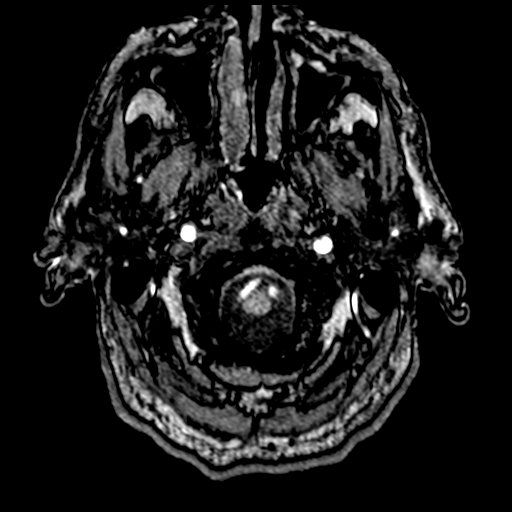
[im 39/204]
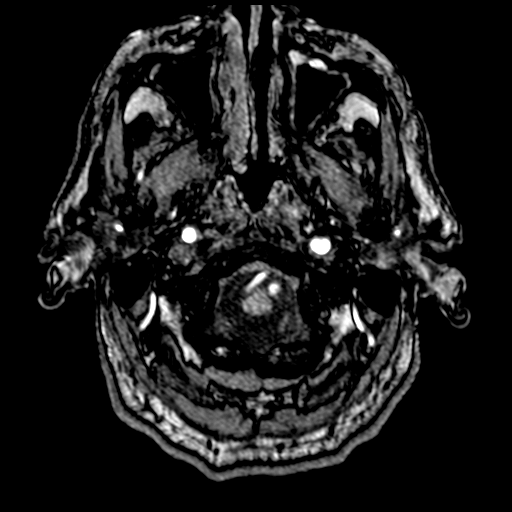
[im 44/204]
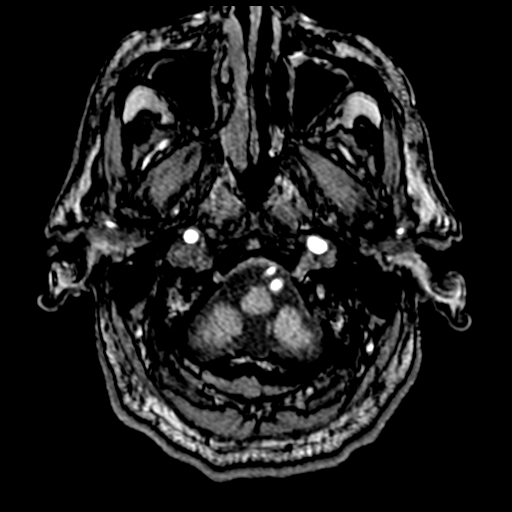
[im 65/204]
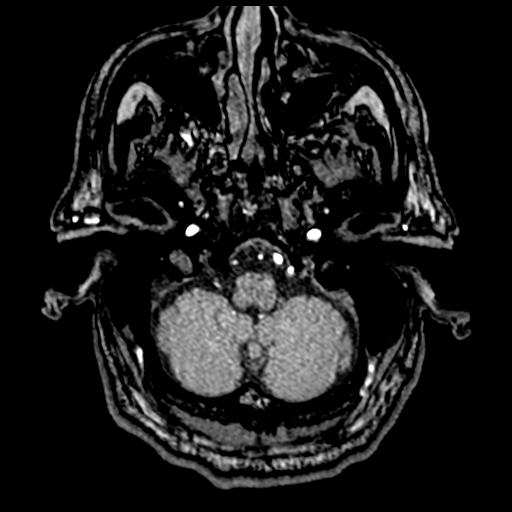
[im 91/204]
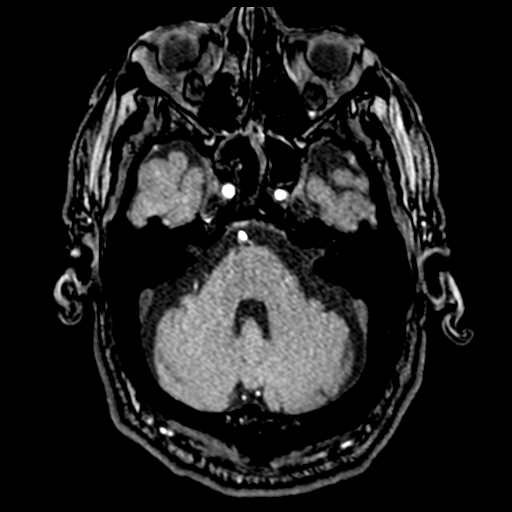
[im 104/204]
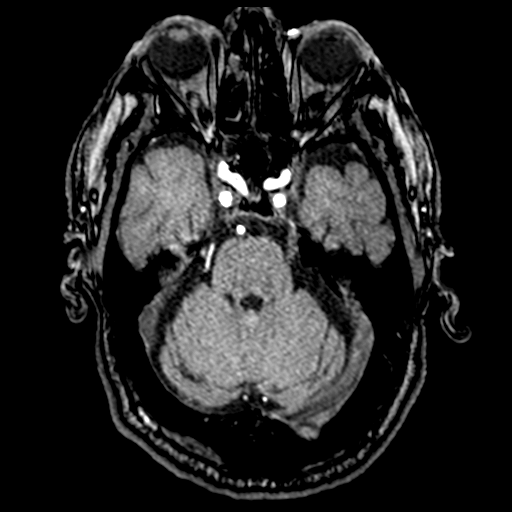
[im 117/204]
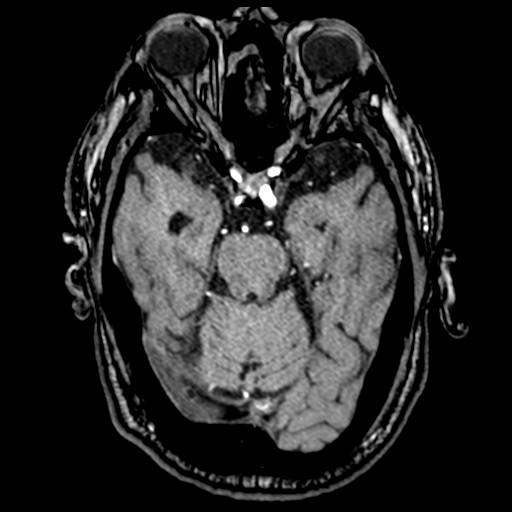
[im 143/204]
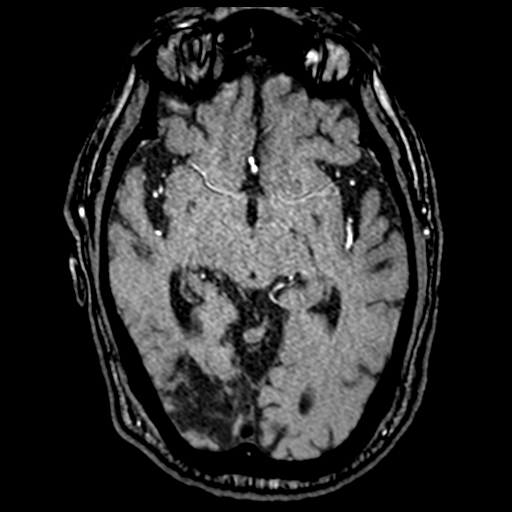
[im 169/204]
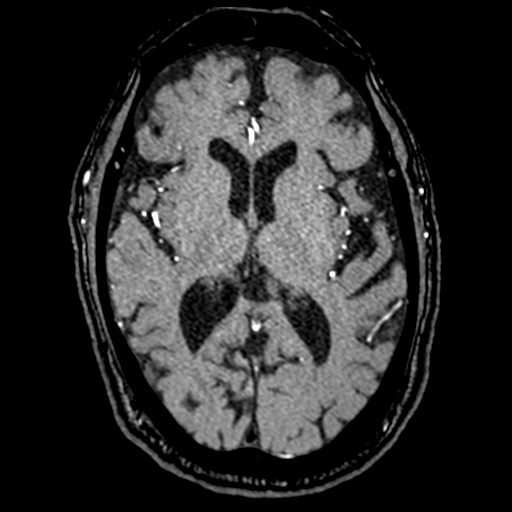
[im 173/204]
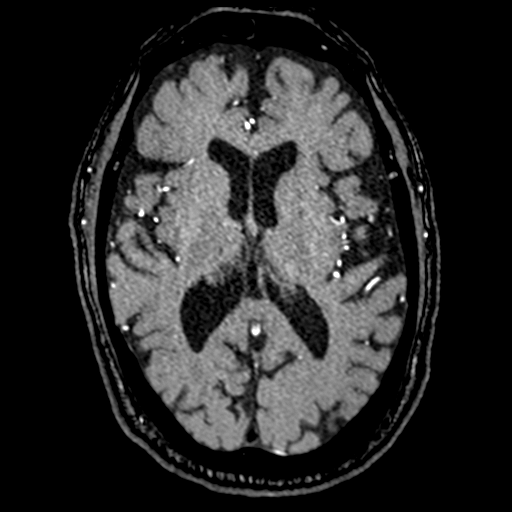
[im 195/204]
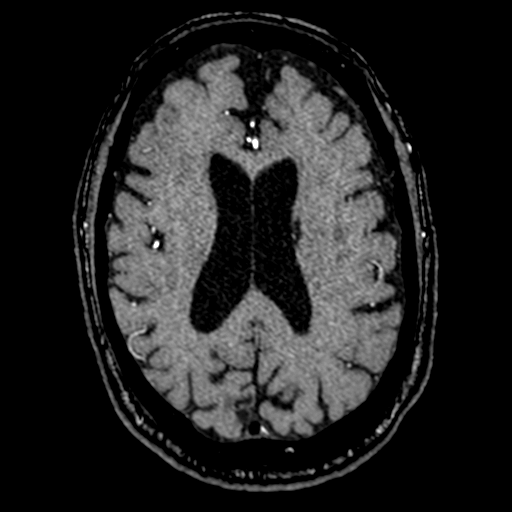

[19 of 48 positions shown; findings below may reference images not displayed]

FINDINGS: POSTERIOR CIRCULATION:

--Vertebral arteries: Normal V4 segments.

--Inferior cerebellar arteries: Normal.

--Basilar artery: Normal.

--Superior cerebellar arteries: Normal.

--Posterior cerebral arteries: Multiple severe stenoses of the right
PCA involving the P1, P2 and P3 segments. Multifocal narrowing of
the distal left PCA.

ANTERIOR CIRCULATION:

--Intracranial internal carotid arteries: Normal.

--Anterior cerebral arteries (ACA): Both A1 segments are present.
There is severe stenosis of the mid right A2 segment.

--Middle cerebral arteries (MCA): Multiple moderate-to-severe
stenoses of the right MCA M1 and M2 segments. No high-grade stenosis
or occlusion of the left MCA.
IMPRESSION: 1. No left MCA occlusion or high-grade stenosis.
2. Multiple severe stenoses of the right PCA.
3. Moderate-to-severe stenoses of the right MCA M1 and M2 segments.
4. Severe stenosis of the mid right ACA A2 segment.

## 2020-05-08 MED ORDER — CLOPIDOGREL BISULFATE 75 MG PO TABS
75.0000 mg | ORAL_TABLET | Freq: Every day | ORAL | Status: DC
  Administered 2020-05-08: 75 mg via ORAL
  Filled 2020-05-08: qty 1

## 2020-05-08 MED ORDER — ASPIRIN 325 MG PO TABS
325.0000 mg | ORAL_TABLET | Freq: Every day | ORAL | Status: DC
  Administered 2020-05-08: 325 mg via ORAL
  Filled 2020-05-08: qty 1

## 2020-05-08 MED ORDER — APIXABAN 5 MG PO TABS: 5.0000 mg | ORAL_TABLET | Freq: Two times a day (BID) | ORAL | 0 refills | Status: DC

## 2020-05-08 MED ORDER — CLOPIDOGREL BISULFATE 75 MG PO TABS: 75.0000 mg | ORAL_TABLET | Freq: Every day | ORAL | 0 refills | Status: DC

## 2020-05-08 MED ORDER — ASPIRIN 325 MG PO TABS: 325.0000 mg | ORAL_TABLET | Freq: Every day | ORAL | 0 refills | Status: DC

## 2020-05-08 NOTE — Discharge Summary (Signed)
Physician Discharge Summary  Brendan Long OZH:[id number] DOB: 1929-04-29 DOA: 05/06/2020  PCP: Isaac Bliss, Rayford Halsted, MD  Admit date: 05/06/2020 Discharge date: 05/08/2020  Admitted From: Home Disposition: Home  Recommendations for Outpatient Follow-up:  1. Follow up with PCP in 1-2 weeks 2. Neurology as scheduled  Discharge Condition: Stable CODE STATUS: Full Diet recommendation: Low-fat low-salt diet  Brief/Interim Summary: Patient is a pleasant 86 year old gentleman history of hypertension, hyperlipidemia, hypothyroidism, A. 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  Patient denies any neurological deficits.  Patient admitted for stroke work-up.  Patient met as above with no symptomatology but noted to have abnormal outpatient MRI and PCP at the [facility] indicated he should come to the hospital for further evaluation.  There was an episode of confusion and dizziness some 8 months ago in February 2021 but patient denies any other episodes of weakness, confusion or vision changes.  Patient carries a history of A. fib but is not on anticoagulation, unclear reasoning, will need to follow-up at the [place] but has been initiated on Eliquis for A. fib prophylaxis.  As such we will hold off on aspirin or Plavix at this time.  Patient otherwise feels quite well, otherwise stable and requesting discharge home which is certainly reasonable given minimal to no symptoms and work-up as below.   Discharge Diagnoses:  Principal Problem:   CVA (cerebral vascular accident) ([place]) Active Problems:   Dyslipidemia   Essential hypertension   Stroke ([place])   History of CVA (cerebrovascular accident)   AF (paroxysmal atrial fibrillation) (HCC)   DM (diabetes mellitus), type 2 with renal complications (HCC)   AKI (acute kidney  injury) ([place])   Abnormal echocardiogram   CKD (chronic kidney disease) stage 3, GFR 30-59 ml/min (HCC)   Acute CVA (cerebrovascular accident) [facility])    Discharge Instructions   Allergies as of 05/08/2020   No Known Allergies     Medication List    STOP taking these medications   amLODipine 10 MG tablet Commonly known as: NORVASC   aspirin 325 MG tablet   HYDROcodone-Acetaminophen 5-300 MG Tabs   metoprolol tartrate 50 MG tablet Commonly known as: LOPRESSOR     TAKE these medications   apixaban 5 MG Tabs tablet Commonly known as: Eliquis Take 1 tablet (5 mg total) by mouth 2 (two) times daily.   atorvastatin 80 MG tablet Commonly known as: LIPITOR TAKE 1 TABLET DAILY   budesonide-formoterol 80-4.5 MCG/ACT inhaler Commonly known as: SYMBICORT Inhale 2 puffs into the lungs 2 (two) times daily.   clobetasol ointment 0.05 % Commonly known as: TEMOVATE APPLY TOPICALLY TWICE A DAY What changed:   how much to take  when to take this  reasons to take this   diclofenac sodium 1 % Gel Commonly known as: VOLTAREN APPLY 4 GRAMS TOPICALLY FOUR TIMES A DAY AS NEEDED FOR KNEE PAIN What changed: See the new instructions.   omega-3 acid ethyl esters 1 g capsule Commonly known as: LOVAZA Take 2 capsules (2 g total) by mouth 2 (two) times daily.   polyethylene glycol 17 g packet Commonly known as: MIRALAX / GLYCOLAX Take 17 g by mouth daily as needed for severe constipation.   sildenafil 100 MG tablet Commonly known as: VIAGRA TAKE 1 TABLET AS NEEDED ONE HOUR BEFORE NEEDED FOR ERECTILE DYSFUNCTION   Tricor  48 MG tablet Generic drug: fenofibrate TAKE 1 TABLET DAILY   Vitamin D (Ergocalciferol) 1.25 MG (50000 UNIT) Caps capsule Commonly known as: DRISDOL TAKE 1 CAPSULE EVERY 7 DAYS FOR 12 DOSES       Follow-up Information    [facility] .   Specialty: Vascular Surgery Contact information: [place] [id number] [place] [address] [phone]       [place] [facility]. Schedule an appointment as soon as possible for a visit in 4 week(s).              No Known Allergies  Consultations:  Neurology, Dr. Leonie Man, cardiology Dr. Gardiner Rhyme   Procedures/Studies: DG Chest 2 View  Result Date: 05/06/2020 CLINICAL DATA:  Stroke EXAM: CHEST - 2 VIEW COMPARISON:  11/21/2017 FINDINGS: No focal opacity or pleural effusion. Stable cardiomediastinal silhouette with aortic atherosclerosis. No pneumothorax. Postsurgical changes of the right humeral head IMPRESSION: No active cardiopulmonary disease. Electronically Signed   By: Donavan Foil M.D.   On: 05/06/2020 20:36   MR ANGIO HEAD WO CONTRAST  Result Date: 05/08/2020 CLINICAL DATA:  Abnormal MRI at outside institution. Reported left basal ganglia infarct. EXAM: MRA HEAD WITHOUT CONTRAST TECHNIQUE: Angiographic images of the Circle of Willis were obtained using MRA technique without intravenous contrast. COMPARISON:  None. FINDINGS: POSTERIOR CIRCULATION: --Vertebral arteries: Normal V4 segments. --Inferior cerebellar arteries: Normal. --Basilar artery: Normal. --Superior cerebellar arteries: Normal. --Posterior cerebral arteries: Multiple severe stenoses of the right PCA involving the P1, P2 and P3 segments. Multifocal narrowing of the distal left PCA. ANTERIOR CIRCULATION: --Intracranial internal carotid arteries: Normal. --Anterior cerebral arteries (ACA): Both A1 segments are present. There is severe stenosis of the mid right A2 segment. --Middle cerebral arteries (MCA): Multiple moderate-to-severe stenoses of the right MCA M1 and M2 segments. No high-grade stenosis or occlusion of the left MCA. IMPRESSION: 1. No left MCA occlusion or high-grade stenosis. 2. Multiple severe stenoses of the right PCA. 3. Moderate-to-severe stenoses of the right MCA M1 and M2 segments. 4. Severe stenosis of the mid right ACA A2 segment. Electronically  Signed   By: Ulyses Jarred M.D.   On: 05/08/2020 01:59   ECHOCARDIOGRAM COMPLETE  Result Date: 05/07/2020    ECHOCARDIOGRAM REPORT   Patient Name:   Brendan Long [facility] Date of Exam: 05/07/2020 Medical Rec #:  [id number]          Height:       70.0 in Accession #:    [id number]         Weight:       176.0 lb Date of Birth:  06/12/1929          BSA:          1.977 m Patient Age:    89 years           BP:           160/97 mmHg Patient Gender: M                  HR:           70 bpm. Exam Location:  Inpatient Procedure: 2D Echo, Color Doppler and Cardiac Doppler Indications:    Stroke i163.9  History:        Patient has prior history of Echocardiogram examinations, most                 recent 03/19/2019. Arrythmias:Atrial Fibrillation; Risk  Factors:Hypertension, Diabetes and Dyslipidemia.  Sonographer:    Raquel Sarna Senior RDCS Referring Phys: [place]  Sonographer Comments: Technically difficult due to poor echo windows. IMPRESSIONS  1. Hypokinesis of the basal/mid inferolateral wall with overall normal LV function; grade 1 diastolic dysfunction.  2. Left ventricular ejection fraction, by estimation, is 55 to 60%. The left ventricle has normal function. The left ventricle demonstrates regional wall motion abnormalities (see scoring diagram/findings for description). Left ventricular diastolic parameters are consistent with Grade I diastolic dysfunction (impaired relaxation).  3. Right ventricular systolic function is normal. The right ventricular size is normal.  4. The mitral valve is normal in structure. No evidence of mitral valve regurgitation. No evidence of mitral stenosis.  5. The aortic valve is tricuspid. Aortic valve regurgitation is not visualized. Mild aortic valve sclerosis is present, with no evidence of aortic valve stenosis.  6. The inferior vena cava is normal in size with greater than 50% respiratory variability, suggesting right atrial pressure of 3 mmHg. FINDINGS  Left  Ventricle: Left ventricular ejection fraction, by estimation, is 55 to 60%. The left ventricle has normal function. The left ventricle demonstrates regional wall motion abnormalities. The left ventricular internal cavity size was normal in size. There is no left ventricular hypertrophy. Left ventricular diastolic parameters are consistent with Grade I diastolic dysfunction (impaired relaxation). Right Ventricle: The right ventricular size is normal.Right ventricular systolic function is normal. Left Atrium: Left atrial size was normal in size. Right Atrium: Right atrial size was normal in size. Pericardium: There is no evidence of pericardial effusion. Mitral Valve: The mitral valve is normal in structure. Mild mitral annular calcification. No evidence of mitral valve regurgitation. No evidence of mitral valve stenosis. Tricuspid Valve: The tricuspid valve is normal in structure. Tricuspid valve regurgitation is not demonstrated. No evidence of tricuspid stenosis. Aortic Valve: The aortic valve is tricuspid. Aortic valve regurgitation is not visualized. Mild aortic valve sclerosis is present, with no evidence of aortic valve stenosis. Pulmonic Valve: The pulmonic valve was grossly normal. Pulmonic valve regurgitation is not visualized. No evidence of pulmonic stenosis. Aorta: The aortic root is normal in size and structure. Venous: The inferior vena cava is normal in size with greater than 50% respiratory variability, suggesting right atrial pressure of 3 mmHg. IAS/Shunts: No atrial level shunt detected by color flow Doppler. Additional Comments: Hypokinesis of the basal/mid inferolateral wall with overall normal LV function; grade 1 diastolic dysfunction.  LEFT VENTRICLE PLAX 2D LVIDd:         3.90 cm  Diastology LVIDs:         2.80 cm  LV e' medial:    3.92 cm/s LV PW:         1.00 cm  LV E/e' medial:  13.8 LV IVS:        0.90 cm  LV e' lateral:   5.98 cm/s LVOT diam:     2.30 cm  LV E/e' lateral: 9.0 LV SV:          56 LV SV Index:   28 LVOT Area:     4.15 cm  RIGHT VENTRICLE RV S prime:     8.70 cm/s TAPSE (M-mode): 1.8 cm LEFT ATRIUM             Index LA diam:        3.30 cm 1.67 cm/m LA Vol (A2C):   27.7 ml 14.01 ml/m LA Vol (A4C):   41.4 ml 20.94 ml/m LA Biplane Vol: 35.4 ml 17.[place]  ml/m  AORTIC VALVE LVOT Vmax:   56.80 cm/s LVOT Vmean:  38.900 cm/s LVOT VTI:    0.134 m  AORTA Ao Root diam: 3.00 cm MITRAL VALVE MV Area (PHT): 3.17 cm    SHUNTS MV Decel Time: 239 msec    Systemic VTI:  0.13 m MV E velocity: 54.00 cm/s  Systemic Diam: 2.30 cm MV A velocity: 87.40 cm/s MV E/A ratio:  0.62 Kirk Ruths MD Electronically signed by Kirk Ruths MD Signature Date/Time: 05/07/2020/11:55:43 AM    Final    VAS US CAROTID (at [facility] and [facility] only)  Result Date: 05/07/2020 Carotid Arterial Duplex Study Indications:       CVA. Risk Factors:      Hypertension. Comparison Study:  No prior studies. Performing Technologist: Oliver Hum RVT  Examination Guidelines: A complete evaluation includes B-mode imaging, spectral Doppler, color Doppler, and power Doppler as needed of all accessible portions of each vessel. Bilateral testing is considered an integral part of a complete examination. Limited examinations for reoccurring indications may be performed as noted.  Right Carotid Findings: +----------+--------+--------+--------+-----------------------+--------+           PSV cm/sEDV cm/sStenosisPlaque Description     Comments +----------+--------+--------+--------+-----------------------+--------+ CCA Prox  48      11                                     tortuous +----------+--------+--------+--------+-----------------------+--------+ CCA Distal44      7               smooth and heterogenous         +----------+--------+--------+--------+-----------------------+--------+ ICA Prox  42      10              smooth and heterogenoustortuous +----------+--------+--------+--------+-----------------------+--------+  ICA Distal44      14                                              +----------+--------+--------+--------+-----------------------+--------+ ECA       93      12                                              +----------+--------+--------+--------+-----------------------+--------+ +----------+--------+-------+--------+-------------------+           PSV cm/sEDV cmsDescribeArm Pressure (mmHG) +----------+--------+-------+--------+-------------------+ [id number]                                         +----------+--------+-------+--------+-------------------+ +---------+--------+--+--------+-+---------+ VertebralPSV cm/s28EDV cm/s8Antegrade +---------+--------+--+--------+-+---------+  Left Carotid Findings: +----------+--------+-------+--------+--------------------------------+--------+           PSV cm/sEDV    StenosisPlaque Description              Comments                   cm/s                                                    +----------+--------+-------+--------+--------------------------------+--------+  CCA Prox  49      10             smooth and heterogenous                  +----------+--------+-------+--------+--------------------------------+--------+ CCA Distal48      13             smooth and heterogenous                  +----------+--------+-------+--------+--------------------------------+--------+ ICA Prox  35      13             smooth, heterogenous and                                                  calcific                                 +----------+--------+-------+--------+--------------------------------+--------+ ICA Distal43      17                                             tortuous +----------+--------+-------+--------+--------------------------------+--------+ ECA       163     19                                                       +----------+--------+-------+--------+--------------------------------+--------+ +----------+--------+--------+--------+-------------------+           PSV cm/sEDV cm/sDescribeArm Pressure (mmHG) +----------+--------+--------+--------+-------------------+ [id number]                                          +----------+--------+--------+--------+-------------------+ +---------+--------+--+--------+-+---------+ VertebralPSV cm/s41EDV cm/s7Antegrade +---------+--------+--+--------+-+---------+   Summary: Right Carotid: Velocities in the right ICA are consistent with a 1-39% stenosis. Left Carotid: Velocities in the left ICA are consistent with a 1-39% stenosis. Vertebrals: Bilateral vertebral arteries demonstrate antegrade flow. *See table(s) above for measurements and observations.  Electronically signed by Antony Contras MD on 05/07/2020 at 2:34:04 PM.    Final       Subjective: No acute issues or events overnight   Discharge Exam: Vitals:   05/08/20 0817 05/08/20 1131  BP: (!) 176/104 (!) 187/109  Pulse: 71 66  Resp: 16 18  Temp: 98.1 F (36.7 C) 97.6 F (36.4 C)  SpO2: 97% 100%   Vitals:   05/07/20 2337 05/08/20 0329 05/08/20 0817 05/08/20 1131  BP: (!) 150/87 (!) 168/92 (!) 176/104 (!) 187/109  Pulse: 63 64 71 66  Resp: $Remo'18 18 16 18  'nUsui$ Temp: (!) 97.5 F (36.4 C) 97.6 F (36.4 C) 98.1 F (36.7 C) 97.6 F (36.4 C)  TempSrc: Oral Oral Oral Oral  SpO2: 97% 98% 97% 100%  Weight:      Height:        General: Pt is alert, awake, not in acute distress Cardiovascular: Irregularly irregular with no murmurs rubs or gallops Respiratory: CTA bilaterally, no wheezing, no rhonchi Abdominal: Soft, NT, ND,  bowel sounds + Extremities: no edema, no cyanosis    The results of significant diagnostics from this hospitalization (including imaging, microbiology, ancillary and laboratory) are listed below for reference.     Microbiology: Recent Results (from the past 240  hour(s))  Respiratory Panel by RT PCR (Flu A&B, Covid) - Nasopharyngeal Swab     Status: None   Collection Time: 05/06/20  6:20 PM   Specimen: Nasopharyngeal Swab  Result Value Ref Range Status   SARS Coronavirus 2 by RT PCR NEGATIVE NEGATIVE Final    Comment: (NOTE) SARS-CoV-2 target nucleic acids are NOT DETECTED.  The SARS-CoV-2 RNA is generally detectable in upper respiratoy specimens during the acute phase of infection. The lowest concentration of SARS-CoV-2 viral copies this assay can detect is 131 copies/mL. A negative result does not preclude SARS-Cov-2 infection and should not be used as the sole basis for treatment or other patient management decisions. A negative result may occur with  improper specimen collection/handling, submission of specimen other than nasopharyngeal swab, presence of viral mutation(s) within the areas targeted by this assay, and inadequate number of viral copies (<131 copies/mL). A negative result must be combined with clinical observations, patient history, and epidemiological information. The expected result is Negative.  Fact Sheet for Patients:  PinkCheek.be  Fact Sheet for Healthcare Providers:  GravelBags.it  This test is no t yet approved or cleared by the [place] FDA and  has been authorized for detection and/or diagnosis of SARS-CoV-2 by FDA under an Emergency Use Authorization (EUA). This EUA will remain  in effect (meaning this test can be used) for the duration of the COVID-19 declaration under Section 564(b)(1) of the Act, 21 U.S.C. section 360bbb-3(b)(1), unless the authorization is terminated or revoked sooner.     Influenza A by PCR NEGATIVE NEGATIVE Final   Influenza B by PCR NEGATIVE NEGATIVE Final    Comment: (NOTE) The Xpert Xpress SARS-CoV-2/FLU/RSV assay is intended as an aid in  the diagnosis of influenza from Nasopharyngeal swab specimens and  should not  be used as a sole basis for treatment. Nasal washings and  aspirates are unacceptable for Xpert Xpress SARS-CoV-2/FLU/RSV  testing.  Fact Sheet for Patients: PinkCheek.be  Fact Sheet for Healthcare Providers: GravelBags.it  This test is not yet approved or cleared by the [place] FDA and  has been authorized for detection and/or diagnosis of SARS-CoV-2 by  FDA under an Emergency Use Authorization (EUA). This EUA will remain  in effect (meaning this test can be used) for the duration of the  Covid-19 declaration under Section 564(b)(1) of the Act, 21  U.S.C. section 360bbb-3(b)(1), unless the authorization is  terminated or revoked. Performed at [facility], [place] [address], [place], [place] [address]      Labs: BNP (last 3 results) No results for input(s): BNP in the last 8760 hours. Basic Metabolic Panel: Recent Labs  Lab 05/06/20 1645 05/07/20 0941  NA 137 139  K 3.7 3.6  CL 101 102  CO2 28 25  GLUCOSE 79 95  BUN 38* 30*  CREATININE 1.97* 1.78*  CALCIUM 9.6 9.0  MG 2.2 2.0   Liver Function Tests: No results for input(s): AST, ALT, ALKPHOS, BILITOT, PROT, ALBUMIN in the last 168 hours. No results for input(s): LIPASE, AMYLASE in the last 168 hours. No results for input(s): AMMONIA in the last 168 hours. CBC: Recent Labs  Lab 05/06/20 1645 05/07/20 0941  WBC 9.9 7.1  NEUTROABS 5.1 3.8  HGB 15.9 14.5  HCT 46.6 43.5  MCV 93.8 94.2  PLT 227 181   Cardiac Enzymes: No results for input(s): CKTOTAL, CKMB, CKMBINDEX, TROPONINI in the last 168 hours. BNP: Invalid input(s): POCBNP CBG: Recent Labs  Lab 05/07/20 2012 05/07/20 2057 05/08/20 0329 05/08/20 0814 05/08/20 1130  GLUCAP 135* 133* 98 78 106*   D-Dimer No results for input(s): DDIMER in the last 72 hours. Hgb A1c Recent Labs    05/07/20 0416  HGBA1C 6.0*   Lipid Profile Recent Labs    05/07/20 0417  CHOL 194   HDL 26*  LDLCALC 132*  TRIG 182*  CHOLHDL 7.5   Thyroid function studies No results for input(s): TSH, T4TOTAL, T3FREE, THYROIDAB in the last 72 hours.  Invalid input(s): FREET3 Anemia work up No results for input(s): VITAMINB12, FOLATE, FERRITIN, TIBC, IRON, RETICCTPCT in the last 72 hours. Urinalysis    Component Value Date/Time   COLORURINE YELLOW 05/06/2020 2255   APPEARANCEUR CLEAR 05/06/2020 2255   LABSPEC 1.019 05/06/2020 2255   PHURINE 6.0 05/06/2020 2255   GLUCOSEU NEGATIVE 05/06/2020 2255   GLUCOSEU NEGATIVE 12/15/2016 1015   HGBUR NEGATIVE 05/06/2020 2255   HGBUR negative 11/09/2009 0000   BILIRUBINUR NEGATIVE 05/06/2020 2255   BILIRUBINUR n 11/15/2010 1151   KETONESUR NEGATIVE 05/06/2020 2255   PROTEINUR 30 (A) 05/06/2020 2255   UROBILINOGEN 0.2 12/15/2016 1015   NITRITE NEGATIVE 05/06/2020 2255   LEUKOCYTESUR NEGATIVE 05/06/2020 2255   Sepsis Labs Invalid input(s): PROCALCITONIN,  WBC,  LACTICIDVEN Microbiology Recent Results (from the past 240 hour(s))  Respiratory Panel by RT PCR (Flu A&B, Covid) - Nasopharyngeal Swab     Status: None   Collection Time: 05/06/20  6:20 PM   Specimen: Nasopharyngeal Swab  Result Value Ref Range Status   SARS Coronavirus 2 by RT PCR NEGATIVE NEGATIVE Final    Comment: (NOTE) SARS-CoV-2 target nucleic acids are NOT DETECTED.  The SARS-CoV-2 RNA is generally detectable in upper respiratoy specimens during the acute phase of infection. The lowest concentration of SARS-CoV-2 viral copies this assay can detect is 131 copies/mL. A negative result does not preclude SARS-Cov-2 infection and should not be used as the sole basis for treatment or other patient management decisions. A negative result may occur with  improper specimen collection/handling, submission of specimen other than nasopharyngeal swab, presence of viral mutation(s) within the areas targeted by this assay, and inadequate number of viral copies (<131  copies/mL). A negative result must be combined with clinical observations, patient history, and epidemiological information. The expected result is Negative.  Fact Sheet for Patients:  PinkCheek.be  Fact Sheet for Healthcare Providers:  GravelBags.it  This test is no t yet approved or cleared by the [place] FDA and  has been authorized for detection and/or diagnosis of SARS-CoV-2 by FDA under an Emergency Use Authorization (EUA). This EUA will remain  in effect (meaning this test can be used) for the duration of the COVID-19 declaration under Section 564(b)(1) of the Act, 21 U.S.C. section 360bbb-3(b)(1), unless the authorization is terminated or revoked sooner.     Influenza A by PCR NEGATIVE NEGATIVE Final   Influenza B by PCR NEGATIVE NEGATIVE Final    Comment: (NOTE) The Xpert Xpress SARS-CoV-2/FLU/RSV assay is intended as an aid in  the diagnosis of influenza from Nasopharyngeal swab specimens and  should not be used as a sole basis for treatment. Nasal washings and  aspirates are unacceptable for Xpert Xpress SARS-CoV-2/FLU/RSV  testing.  Fact Sheet for Patients: PinkCheek.be  Fact Sheet  for Healthcare Providers: GravelBags.it  This test is not yet approved or cleared by the [place] and  has been authorized for detection and/or diagnosis of SARS-CoV-2 by  FDA under an Emergency Use Authorization (EUA). This EUA will remain  in effect (meaning this test can be used) for the duration of the  Covid-19 declaration under Section 564(b)(1) of the Act, 21  U.S.C. section 360bbb-3(b)(1), unless the authorization is  terminated or revoked. Performed at [facility], [place] [address] [place], [place] [address]      Time coordinating discharge: Over 30 minutes  SIGNED:   Little Ishikawa, DO [facility] 05/08/2020, 2:08 PM Pager   If 7PM-7AM, please contact night-coverage [email or web address]

## 2020-05-08 NOTE — Progress Notes (Signed)
STROKE TEAM PROGRESS NOTE   INTERVAL HISTORY No family is at the bedside.  Pt stated that he has no symptoms for stroke. He went to see his PCP in [place] for neck pain, more on the right and had MRI showing stroke. He can not remember any stroke symptoms lately, however, he did have confusion and dizziness episode in 09/2019. He has afib but not on [facility] and he is agreeable for eliquis this time.    OBJECTIVE Vitals:   05/07/20 2057 05/07/20 2337 05/08/20 0329 05/08/20 0817  BP: (!) 164/88 (!) 150/87 (!) 168/92 (!) 176/104  Pulse: 72 63 64 71  Resp: 19 18 18 16   Temp: (!) 97.5 F (36.4 C) (!) 97.5 F (36.4 C) 97.6 F (36.4 C) 98.1 F (36.7 C)  TempSrc: Oral Oral Oral Oral  SpO2: 97% 97% 98% 97%  Weight:      Height:        CBC:  Recent Labs  Lab 05/06/20 1645 05/07/20 0941  WBC 9.9 7.1  NEUTROABS 5.1 3.8  HGB 15.9 14.5  HCT 46.6 43.5  MCV 93.8 94.2  PLT 227 536    Basic Metabolic Panel:  Recent Labs  Lab 05/06/20 1645 05/07/20 0941  NA 137 139  K 3.7 3.6  CL 101 102  CO2 28 25  GLUCOSE 79 95  BUN 38* 30*  CREATININE 1.97* 1.78*  CALCIUM 9.6 9.0  MG 2.2 2.0    Lipid Panel:     Component Value Date/Time   CHOL 194 05/07/2020 0417   TRIG 182 (H) 05/07/2020 0417   TRIG 317 (HH) 07/13/2006 1523   HDL 26 (L) 05/07/2020 0417   CHOLHDL 7.5 05/07/2020 0417   VLDL 36 05/07/2020 0417   LDLCALC 132 (H) 05/07/2020 0417   HgbA1c:  Lab Results  Component Value Date   HGBA1C 6.0 (H) 05/07/2020   Urine Drug Screen:     Component Value Date/Time   LABOPIA NONE DETECTED 03/18/2019 1935   COCAINSCRNUR NONE DETECTED 03/18/2019 1935   LABBENZ NONE DETECTED 03/18/2019 1935   AMPHETMU NONE DETECTED 03/18/2019 1935   THCU NONE DETECTED 03/18/2019 1935   LABBARB NONE DETECTED 03/18/2019 1935    Alcohol Level     Component Value Date/Time   ETH <10 03/18/2019 1528    IMAGING  DG Chest 2 View 05/06/2020 IMPRESSION:  No active cardiopulmonary disease.    MR ANGIO  HEAD WO CONTRAST  05/08/2020 IMPRESSION:  1. No left MCA occlusion or high-grade stenosis.  2. Multiple severe stenoses of the right PCA.  3. Moderate-to-severe stenoses of the right MCA M1 and M2 segments.  4. Severe stenosis of the mid right ACA A2 segment.   ECHOCARDIOGRAM COMPLETE 05/07/2020 IMPRESSIONS   1. Hypokinesis of the basal/mid inferolateral wall with overall normal LV function; grade 1 diastolic dysfunction.   2. Left ventricular ejection fraction, by estimation, is 55 to 60%. The left ventricle has normal function. The left ventricle demonstrates regional wall motion abnormalities (see scoring diagram/findings for description). Left ventricular diastolic parameters are consistent with Grade I diastolic dysfunction (impaired relaxation).   3. Right ventricular systolic function is normal. The right ventricular size is normal.   4. The mitral valve is normal in structure. No evidence of mitral valve regurgitation. No evidence of mitral stenosis.   5. The aortic valve is tricuspid. Aortic valve regurgitation is not visualized. Mild aortic valve sclerosis is present, with no evidence of aortic valve stenosis.  6. The inferior vena cava is  normal in size with greater than 50% respiratory variability, suggesting right atrial pressure of 3 mmHg.    VAS US CAROTID (at [facility] only) 05/07/2020 Summary:  Right Carotid: Velocities in the right ICA are consistent with a 1-39% stenosis.  Left Carotid: Velocities in the left ICA are consistent with a 1-39% stenosis.  Vertebrals: Bilateral vertebral arteries demonstrate antegrade flow  Final    ECG - SR rate 71 BPM. (See cardiology reading for complete details)   PHYSICAL EXAM  Temp:  [97.5 F (36.4 C)-98.1 F (36.7 C)] 97.6 F (36.4 C) (10/02 1131) Pulse Rate:  [63-72] 66 (10/02 1131) Resp:  [16-19] 18 (10/02 1131) BP: (150-187)/(87-109) 187/109 (10/02 1131) SpO2:  [97 %-100 %] 100 % (10/02 1131)  General - Well nourished,  well developed, in no apparent distress.  Ophthalmologic - fundi not visualized due to noncooperation.  Cardiovascular - Regular rhythm and rate, not in afib.  Mental Status -  Level of arousal and orientation to time, place, and person were intact. Language including expression, naming, repetition, comprehension was assessed and found intact. Fund of Knowledge was assessed and was intact.  Cranial Nerves II - XII - II - Visual field intact OU. III, IV, VI - Extraocular movements intact. V - Facial sensation intact bilaterally. VII - Facial movement intact bilaterally. VIII - Hearing & vestibular intact bilaterally. X - Palate elevates symmetrically. XI - Chin turning & shoulder shrug intact bilaterally. XII - Tongue protrusion intact.  Motor Strength - The patient's strength was normal in all extremities and pronator drift was absent.  Bulk was normal and fasciculations were absent.   Motor Tone - Muscle tone was assessed at the neck and appendages and was normal.  Reflexes - The patient's reflexes were symmetrical in all extremities and he had no pathological reflexes.  Sensory - Light touch, temperature/pinprick were assessed and were symmetrical.    Coordination - The patient had normal movements in the hands with no ataxia or dysmetria.  Tremor was absent.  Gait and Station - deferred.    ASSESSMENT/PLAN [name] is a 86 y.o. male with history of erectile dysfunction, HLD, HTN, Hypothyroidism, Psoriasis, Afibb s/p ablation 14 yrs ago and not on AC, COPD, and Prediabetes who was sent in for stroke workup for embolic appearing infarcts on an outpatient MRI Brain at the [place]. No neurologic deficits on exam.  Stroke: acute left BG and subinsular infarcts, small/large vessel disease vs. Embolic given atrial fibrillation s/p ablation but not anticoagulated  [facility] restricted diffusion in the dorsal left putamen and left subinsular region.  Stable  appearance of old right occipital lobe infarct.  Old left caudate body infarct. Lacunar infarct in the paramedian line right pons is new since prior year but not acute.  Multiple bilateral cerebellar infarcts are new since prior but not acute.   MRA head - Multiple severe stenoses of the right PCA.  Moderate-to-severe stenoses of the right MCA M1 and M2 segments. Severe stenosis of the mid right ACA A2 segment.   Carotid Doppler - unremarkable  2D Echo - EF 55 - 60%. No cardiac source of emboli identified.   Sars Corona Virus 2 - negative  LDL - 132  HgbA1c - 6.0  UDS - negative  VTE prophylaxis - [place] Heparin  ASA 325 mg twice daily prior to admission, now on aspirin 325 mg daily and clopidogrel 75 mg daily. Although not able to see MRI images, the  stroke has been at least 48h out and asymptomatic, OK to start eliquis tonight for stroke prevention. ASA and plavix can be discontinued from neuro standpoint.  Patient counseled to be compliant with his antithrombotic medications  Ongoing aggressive stroke risk factor management  Therapy recommendations:  None  Disposition:  Home today  PAF  S/p ablation 14 years ago  On ASA at home  Currently not in afib  Agree with cardiology for anticoagulation. OK to start eliquis today  Intracranial stenosis  MRA head - Multiple severe stenoses of the right PCA.  Moderate-to-severe stenoses of the right MCA M1 and M2 segments. Moderate left M1 and M2 stenosis. Severe stenosis of the mid right ACA A2 segment.   On ASA and plavix, now change to eliquis due to PAF  Continue eliquis on d/c  Hypertension  Home BP meds: Norvasc ; metoprolol  Current BP meds: none  Stable on the high end . BP gradually normalize in 2-3 days  . Long-term BP goal normotensive  Hyperlipidemia  Home Lipid lowering medication: Lipitor 80 mg daily and Tricor  LDL 132, goal < 70  Current lipid lowering medication: Lipitor 80 mg daily and  Fenofibrate  Continue statin at discharge  Other Stroke Risk Factors  Advanced age  Former cigarette smoker - quit  Other Active Problems Code status - Full code  CKD - stage 3b - creatinine - 1.97->1.78   Hospital day # 2  Neurology will sign off. Please call with questions. Pt will follow up with neurology in the [place] system. Thanks for the consult.  Rosalin Hawking, MD PhD Stroke Neurology 05/08/2020 1:22 PM    To contact Stroke Continuity provider, please refer to [email or web address] After hours, contact General Neurology

## 2020-05-08 NOTE — Discharge Instructions (Signed)
Hospital Discharge After a Stroke  Being discharged from the hospital after a stroke can feel overwhelming. Many things may be different, and it is normal to feel scared or anxious. Some stroke survivors may be able to return to their homes, and others may need more specialized care on a temporary or permanent basis. Your stroke care team will work with you to develop a discharge plan that is best for you. Ask questions if you do not understand something. Invite a friend or family member to participate in discharge planning. Understanding and following your discharge plan can help to prevent another stroke or other problems. Understanding your medicines After a stroke, your health care provider may prescribe one or more types of medicine. It is important to take medicines exactly as told by your health care provider. Serious harm, such as another stroke, can happen if you are unable to take your medicine exactly as prescribed. Make sure you understand:  What medicine to take.  Why you are taking the medicine.  How and when to take it.  If it can be taken with your other medicines and herbal supplements.  Possible side effects.  When to call your health care provider if you have any side effects.  How you will get and pay for your medicines. Medical assistance programs may be able to help you pay for prescription medicines if you cannot afford them. If you are taking an anticoagulant, be sure to take it exactly as told by your health care provider. This type of medicine can increase the risk of bleeding because it works to prevent blood from clotting. You may need to take certain precautions to prevent bleeding. You should contact your health care provider if you have:  Bleeding or bruising.  A fall or other injury to your head.  Blood in your urine or stool (feces). Planning for home safety  Take steps to prevent falls, such as installing grab bars or using a shower chair. Ask a friend or  family member to get needed things in place before you go home if possible. A therapist can come to your home to make recommendations for safety equipment. Ask your health care provider if you would benefit from this service or from home care. Getting needed equipment Ask your health care provider for a list of any medical equipment and supplies you will need at home. These may include items such as:  Walkers.  Canes.  Wheelchairs.  Hand-strengthening devices.  Special eating utensils. Medical equipment can be rented or purchased, depending on your insurance coverage. Check with your insurance company about what is covered. Keeping follow-up visits After a stroke, you will need to follow up regularly with a health care provider. You may also need rehabilitation, which can include physical therapy, occupational therapy, or speech-language therapy. Keeping these appointments is very important to your recovery after a stroke. Be sure to bring your medicine list and discharge papers with you to your appointments. If you need help to keep track of your schedule, use a calendar or appointment reminder. Preventing another stroke Having a stroke puts you at risk for another stroke in the future. Ask your health care provider what actions you can take to lower the risk. These may include:  Increasing how much you exercise.  Making a healthy eating plan.  Quitting smoking.  Managing other health conditions, such as high blood pressure, high cholesterol, or diabetes.  Limiting alcohol use. Knowing the warning signs of a stroke  Make sure  you understand the signs of a stroke. Before you leave the hospital, you will receive information outlining the stroke warning signs. Share these with your friends and family members. "BE FAST" is an easy way to remember the main warning signs of a stroke:  B - Balance. Signs are dizziness, sudden trouble walking, or loss of balance.  E - Eyes. Signs are  trouble seeing or a sudden change in vision.  F - Face. Signs are sudden weakness or numbness of the face, or the face or eyelid drooping on one side.  A - Arms. Signs are weakness or numbness in an arm. This happens suddenly and usually on one side of the body.  S - Speech. Signs are sudden trouble speaking, slurred speech, or trouble understanding what people say.  T - Time. Time to call emergency services. Write down what time symptoms started. Other signs of stroke may include:  A sudden, severe headache with no known cause.  Nausea or vomiting.  Seizure. These symptoms may represent a serious problem that is an emergency. Do not wait to see if the symptoms will go away. Get medical help right away. Call your local emergency services (911 in the [place] Do not drive yourself to the hospital. Make note of the time that you had your first symptoms. Your emergency responders or emergency room staff will need to know this information. Summary  Being discharged from the hospital after a stroke can feel overwhelming. It is normal to feel scared or anxious.  Make sure you take medicines exactly as told by your health care provider.  Know the warning signs of a stroke, and get help right way if you have any of these symptoms. "BE FAST" is an easy way to remember the main warning signs of a stroke. This information is not intended to replace advice given to you by your health care provider. Make sure you discuss any questions you have with your health care provider. Document Revised: 04/16/2019 Document Reviewed: 10/27/2016 Elsevier Patient Education  2020 [place] After Stroke A stroke causes damage to the brain cells, which can affect your ability to walk, talk, and even eat. The impact of a stroke is different for everyone, and so is recovery. A good nutrition plan is important for your recovery. It can also lower your risk of another stroke. If you have difficulty  chewing and swallowing your food, a dietitian or your stroke care team can help so that you can enjoy eating healthy foods. What are tips for following this plan?  Reading food labels  Choose foods that have less than 300 milligrams (mg) of sodium per serving. Limit your sodium intake to less than 1,500 mg per day.  Avoid foods that have saturated fat and trans fat.  Choose foods that are low in cholesterol. Limit the amount of cholesterol you eat each day to less than 200 mg.  Choose foods that are high in fiber. Eat 20-30 grams (g) of fiber each day.  Avoid foods with added sugar. Check the food label for ingredients such as sugar, corn syrup, honey, fructose, molasses, and cane juice. Shopping  At the grocery store, buy most of your food from areas near the walls of the store. This includes: ? Fresh fruits and vegetables. ? Dry grains, beans, nuts, and seeds. ? Fresh seafood, poultry, lean meats, and eggs. ? Low-fat dairy products.  Buy whole ingredients instead of prepackaged foods.  Buy fresh, in-season fruits and vegetables  from local farmers markets.  Buy frozen fruits and vegetables in resealable bags. Cooking  Prepare foods with very little salt. Use herbs or salt-free spices instead.  Cook with heart-healthy oils, such as olive, avocado, canola, soybean, or sunflower oil.  Avoid frying foods. Bake, grill, or broil foods instead.  Remove visible fat and skin from meat and poultry before eating.  Modify food textures as told by your health care provider. Meal planning  Eat a wide variety of colorful fruits and vegetables. Make sure one-half of your plate is filled with fruits and vegetables at each meal.  Eat fruits and vegetables that are high in potassium, such as: ? Apples, bananas, oranges, and melon. ? Sweet potatoes, spinach, zucchini, and tomatoes.  Eat fish that contain heart-healthy fats (omega-3 fats) at least twice a week. These include salmon, tuna,  mackerel, and sardines.  Eat plant foods that are high in omega-3 fats, such as flaxseeds and walnuts. Add these to cereals, yogurt, or pasta dishes.  Eat several servings of high-fiber foods each day, such as fruits, vegetables, whole grains, and beans.  Do not put salt at the table for meals.  When eating out at restaurants: ? Ask the server about low-salt or salt-free food options. ? Avoid fried foods. Look for menu items that are grilled, steamed, broiled, or roasted. ? Ask if your food can be prepared without butter. ? Ask for condiments, such as salad dressings, gravy, or sauces to be served on the side.  If you have difficulty swallowing: ? Choose foods that are softer and easier to chew and swallow. ? Cut foods into small pieces and chew well before swallowing. ? Thicken liquids as told by your health care provider or dietitian. ? Let your health care provider know if your condition does not improve over time. You may need to work with a speech therapist to re-train the muscles that are used for eating. General recommendations  Involve your family and friends in your recovery, if possible. It may be helpful to have a slower meal time and to plan meals that include foods everyone in the family can eat.  Brush your teeth with fluoride toothpaste twice a day, and floss once a day. Keeping a clean mouth can help you swallow and can also help your appetite.  Drink enough water each day to keep your urine pale yellow. If needed, set reminders or ask your family to help you remember to drink water.  Limit alcohol intake to no more than 1 drink a day for nonpregnant women and 2 drinks a day for men. One drink equals 12 oz of beer, 5 oz of wine, or 1 oz of hard liquor. Summary  Following this eating plan can help in your stroke recovery and can decrease your risk for another stroke.  Let your health care provider know if you have problems with swallowing. You may need to work with a  speech therapist. This information is not intended to replace advice given to you by your health care provider. Make sure you discuss any questions you have with your health care provider. Document Revised: 11/14/2018 Document Reviewed: 10/01/2017 Elsevier Patient Education  2020 [place] After a Stroke, Adult A stroke causes damage to the brain cells, which can affect your ability to walk, talk, or remember things. The impact of a stroke is different for everyone, and so is recovery. Some people have progress during the first few days after treatment. Others may take weeks  or longer to make progress. Stroke rehabilitation includes a variety of treatments to help you recover and promote your independence after a stroke. You may not be able do everything that you did before the stroke, but you can learn ways to manage your lifestyle and be as independent as possible. Rehabilitation will start as soon as you are able to participate after your stroke, and it involves care from a team that may include:  Family and friends. Your loved ones know you best and can be very helpful in your recovery.  Physicians.  Nurses.  Physical and occupational therapists.  Speech-language therapists.  A nutritionist.  A psychologist.  A Education officer, museum. Keep open communication with all members of your care team. Share your medical records if needed, and take notes about each provider's recommendations. What is physical therapy? Physical therapists (PTs) help you to improve your coordination, balance, and muscle strength. Physical therapy may involve:  Range of motion exercises.  Help to move between lying, sitting, and standing positions.  Walking with a cane or walker, if needed.  Help using stairs. What is occupational therapy? Occupational therapists (OTs) help you rebuild your ability to do everyday tasks, such as brushing your teeth, going to the bathroom, eating, and getting  dressed. Occupational therapy may also help with:  Vision. Visual scanning is a technique that is used to prevent falls.  Memory and cognitive training. This therapy includes problem-solving techniques and relearning tasks like making a phone call.  Fine muscle movements such as buttoning a shirt or picking up small objects. What is speech therapy? Speech-language therapists help you communicate. After a stroke, you may have problems understanding what people are saying, or you may have trouble writing, speaking, or finding the right word for what you want to say. You may also need speech therapy if you have difficulty swallowing while eating and drinking. Examples of speech-language therapies include:  Techniques to strengthen muscles used in swallowing.  Naming objects or describing pictures. This helps retrain the brain to recognize and remember words.  Exercises to strengthen the muscles involved in talking, including your tongue and lips.  Exercises to retrain your brain in understanding what you read and hear. How often will I need therapy? Therapy will begin as soon as you are able to participate, which is often within the first few days after a stroke. Sessions will be frequent at first. For example, you may have therapy 2-3 hours a day on most days of the week during the first few months. The intensity depends on the type and severity of your stroke. You may need therapy for several months. Therapy may take place in the hospital, at a rehabilitation center, or in your home. Are there any side effects of therapy? Therapy is safe and is usually well-tolerated. You may feel physically and mentally tired after therapy, especially during the first few weeks. Rest before therapy sessions if you need to so you can get the most out of your rehabilitation. Follow these instructions at home:  Involve your family and friends in your recovery, if possible. Having another person to encourage you  is beneficial.  Follow instructions from your speech-language therapist, nutritionist, or health care provider about what you can safely eat and drink. Eat healthy foods. If your ability to swallow was affected by the stroke, you may need to take steps to avoid choking, such as: ? Taking small bites when eating. ? Eating foods that are soft or pureed. ? Drinking liquids that  have been thickened.  Maintain social connections and interactions with friends, family, and community groups. This is an important part of your recovery. Communication challenges and physical challenges may cause you to feel isolated after a stroke.  Consider joining a support group that allows you to talk about the impact of stroke on your life. A psychologist or counselor may be recommended. Your emotional recovery from stroke is just as important as your physical recovery.  Keep all follow-up visits as told by your health care providers. This is important. Summary  Stroke rehabilitation includes a variety of treatments to help you recover and promote your independence after a stroke.  Rehabilitation will start as soon as you are able to participate after your stroke, and it includes care from a team of experts.  The intensity of therapy depends on the type and severity of your stroke. You may need therapy for several months. This information is not intended to replace advice given to you by your health care provider. Make sure you discuss any questions you have with your health care provider. Document Revised: 11/13/2018 Document Reviewed: 07/25/2016 Elsevier Patient Education  [place].

## 2020-05-08 NOTE — Progress Notes (Signed)
Patient refusing Heparin.  States that doctor will need to explain to him why needs it before he agrees to take it.

## 2020-05-08 NOTE — Plan of Care (Signed)
  Problem: Education: Goal: Knowledge of disease or condition will improve Outcome: Adequate for Discharge Goal: Knowledge of secondary prevention will improve Outcome: Adequate for Discharge Goal: Knowledge of patient specific risk factors addressed and post discharge goals established will improve Outcome: Adequate for Discharge Goal: Individualized Educational Video(s) Outcome: Adequate for Discharge   Problem: Coping: Goal: Will verbalize positive feelings about self Outcome: Adequate for Discharge Goal: Will identify appropriate support needs Outcome: Adequate for Discharge   Problem: Health Behavior/Discharge Planning: Goal: Ability to manage health-related needs will improve Outcome: Adequate for Discharge   Problem: Self-Care: Goal: Ability to participate in self-care as condition permits will improve Outcome: Adequate for Discharge Goal: Verbalization of feelings and concerns over difficulty with self-care will improve Outcome: Adequate for Discharge Goal: Ability to communicate needs accurately will improve Outcome: Adequate for Discharge   Problem: Nutrition: Goal: Risk of aspiration will decrease Outcome: Adequate for Discharge Goal: Dietary intake will improve Outcome: Adequate for Discharge   Problem: Intracerebral Hemorrhage Tissue Perfusion: Goal: Complications of Intracerebral Hemorrhage will be minimized Outcome: Adequate for Discharge   Problem: Ischemic Stroke/TIA Tissue Perfusion: Goal: Complications of ischemic stroke/TIA will be minimized Outcome: Adequate for Discharge   Problem: Spontaneous Subarachnoid Hemorrhage Tissue Perfusion: Goal: Complications of Spontaneous Subarachnoid Hemorrhage will be minimized Outcome: Adequate for Discharge   

## 2020-05-08 NOTE — Progress Notes (Signed)
Nsg Discharge Note  Admit Date:  05/06/2020 Discharge date: 05/08/2020   Brendan Long to be D/C'd home per MD order.  AVS completed. Patient/caregiver able to verbalize understanding.  Discharge Medication: Allergies as of 05/08/2020   No Known Allergies     Medication List    STOP taking these medications   amLODipine 10 MG tablet Commonly known as: NORVASC   aspirin 325 MG tablet   HYDROcodone-Acetaminophen 5-300 MG Tabs   metoprolol tartrate 50 MG tablet Commonly known as: LOPRESSOR     TAKE these medications   apixaban 5 MG Tabs tablet Commonly known as: Eliquis Take 1 tablet (5 mg total) by mouth 2 (two) times daily.   atorvastatin 80 MG tablet Commonly known as: LIPITOR TAKE 1 TABLET DAILY   budesonide-formoterol 80-4.5 MCG/ACT inhaler Commonly known as: SYMBICORT Inhale 2 puffs into the lungs 2 (two) times daily.   clobetasol ointment 0.05 % Commonly known as: TEMOVATE APPLY TOPICALLY TWICE A DAY What changed:   how much to take  when to take this  reasons to take this   diclofenac sodium 1 % Gel Commonly known as: VOLTAREN APPLY 4 GRAMS TOPICALLY FOUR TIMES A DAY AS NEEDED FOR KNEE PAIN What changed: See the new instructions.   omega-3 acid ethyl esters 1 g capsule Commonly known as: LOVAZA Take 2 capsules (2 g total) by mouth 2 (two) times daily.   polyethylene glycol 17 g packet Commonly known as: MIRALAX / GLYCOLAX Take 17 g by mouth daily as needed for severe constipation.   sildenafil 100 MG tablet Commonly known as: VIAGRA TAKE 1 TABLET AS NEEDED ONE HOUR BEFORE NEEDED FOR ERECTILE DYSFUNCTION   Tricor 48 MG tablet Generic drug: fenofibrate TAKE 1 TABLET DAILY   Vitamin D (Ergocalciferol) 1.25 MG (50000 UNIT) Caps capsule Commonly known as: DRISDOL TAKE 1 CAPSULE EVERY 7 DAYS FOR 12 DOSES       Discharge Assessment: Vitals:   05/08/20 1131 05/08/20 1300  BP: (!) 187/109 (!) 155/90  Pulse: 66 62  Resp: 18 20  Temp:  97.6 F (36.4 C)   SpO2: 100% 100%   Skin clean, dry and intact without evidence of skin break down, no evidence of skin tears noted. IV catheter discontinued intact. Site without signs and symptoms of complications - no redness or edema noted at insertion site, patient denies c/o pain - only slight tenderness at site.  Dressing with slight pressure applied.  D/c Instructions-Education: Discharge instructions given to patient/family with verbalized understanding. D/c education completed with patient/family including follow up instructions, medication list, d/c activities limitations if indicated, with other d/c instructions as indicated by MD - patient able to verbalize understanding, all questions fully answered. Patient instructed to return to ED, call 911, or call MD for any changes in condition.  Patient escorted via [place], and D/C home via private auto.  Atilano Ina, RN 05/08/2020 2:53 PM

## 2020-05-18 ENCOUNTER — Other Ambulatory Visit: Payer: Self-pay | Admitting: *Deleted

## 2020-05-18 NOTE — Patient Outreach (Signed)
[place] [facility]) Care Management  05/18/2020  Brendan Long [facility] 03-09-1929 [id number]   Referral received from care management assistant as member was recently discharged from hospital after having stroke.  Call placed to member for assessment of needs, no answer.  HIPAA compliant voice message left, will send unsuccessful outreach letter and follow up within the next 3-4 business days.  [name], [place], MSN [place] [phone]

## 2020-05-24 ENCOUNTER — Other Ambulatory Visit: Payer: Self-pay | Admitting: *Deleted

## 2020-05-24 NOTE — Patient Outreach (Signed)
[place] [facility]) Care Management  05/24/2020  Brendan Long [facility] [birth year] [id number]   Outreach attempt #2, unsuccessful, message left. Will follow up within the next 3-4 business days.  [name], [place], MSN [place] [phone]

## 2020-05-27 ENCOUNTER — Other Ambulatory Visit: Payer: Self-pay | Admitting: *Deleted

## 2020-05-27 NOTE — Patient Outreach (Signed)
[place] [facility]) Care Management  05/27/2020  Alban Marucci [facility] [birth year] [id number]  Covering [name], RN Telephone Screen-Unsuccessful  Outreach #3 RN attempted outreach call however unsuccessful and only able to leave a HIPAA approved voice message requesting a call back.  Will report off and request a follow up once again next month with a 4th outreach call for pending [facility] services.  Raina Mina, RN Care Management Coordinator [place] Office [phone]

## 2020-06-18 ENCOUNTER — Other Ambulatory Visit: Payer: Self-pay | Admitting: *Deleted

## 2020-06-18 NOTE — Patient Outreach (Signed)
[name] Care Management  06/18/2020  Brendan Long [facility] 04/26/1929 [id number]   Outreach attempt #4, unsuccessful, HIPAA compliant voice message left.  No response from member after multiple unsuccessful outreach attempts and letter sent.  Will close case at this time due to inability to maintain contact.  Will notify member and primary MD of case closure.  [name], [place], MSN [place] [phone]

## 2020-07-08 ENCOUNTER — Other Ambulatory Visit: Payer: Self-pay | Admitting: Endocrinology

## 2020-10-08 ENCOUNTER — Other Ambulatory Visit: Payer: Self-pay | Admitting: Internal Medicine

## 2020-10-08 DIAGNOSIS — N529 Male erectile dysfunction, unspecified: Secondary | ICD-10-CM

## 2021-03-19 ENCOUNTER — Emergency Department (HOSPITAL_COMMUNITY): Payer: Medicare Other

## 2021-03-19 ENCOUNTER — Observation Stay (HOSPITAL_COMMUNITY)
Admission: EM | Admit: 2021-03-19 | Discharge: 2021-03-20 | Disposition: A | Discharge status: Home / Self Care | Payer: Medicare Other | Attending: Internal Medicine | Admitting: Internal Medicine

## 2021-03-19 ENCOUNTER — Observation Stay (HOSPITAL_COMMUNITY): Payer: Medicare Other

## 2021-03-19 ENCOUNTER — Encounter (HOSPITAL_COMMUNITY): Payer: Self-pay | Admitting: Family Medicine

## 2021-03-19 DIAGNOSIS — N179 Acute kidney failure, unspecified: Secondary | ICD-10-CM | POA: Diagnosis not present

## 2021-03-19 DIAGNOSIS — I517 Cardiomegaly: Secondary | ICD-10-CM | POA: Diagnosis not present

## 2021-03-19 DIAGNOSIS — N183 Chronic kidney disease, stage 3 unspecified: Secondary | ICD-10-CM | POA: Insufficient documentation

## 2021-03-19 DIAGNOSIS — E039 Hypothyroidism, unspecified: Secondary | ICD-10-CM | POA: Diagnosis not present

## 2021-03-19 DIAGNOSIS — Z7901 Long term (current) use of anticoagulants: Secondary | ICD-10-CM | POA: Insufficient documentation

## 2021-03-19 DIAGNOSIS — Z96641 Presence of right artificial hip joint: Secondary | ICD-10-CM | POA: Insufficient documentation

## 2021-03-19 DIAGNOSIS — I4510 Unspecified right bundle-branch block: Secondary | ICD-10-CM | POA: Diagnosis not present

## 2021-03-19 DIAGNOSIS — Z79899 Other long term (current) drug therapy: Secondary | ICD-10-CM | POA: Insufficient documentation

## 2021-03-19 DIAGNOSIS — I129 Hypertensive chronic kidney disease with stage 1 through stage 4 chronic kidney disease, or unspecified chronic kidney disease: Secondary | ICD-10-CM | POA: Diagnosis not present

## 2021-03-19 DIAGNOSIS — I10 Essential (primary) hypertension: Secondary | ICD-10-CM | POA: Diagnosis present

## 2021-03-19 DIAGNOSIS — Z8673 Personal history of transient ischemic attack (TIA), and cerebral infarction without residual deficits: Secondary | ICD-10-CM | POA: Insufficient documentation

## 2021-03-19 DIAGNOSIS — I440 Atrioventricular block, first degree: Secondary | ICD-10-CM | POA: Diagnosis not present

## 2021-03-19 DIAGNOSIS — N189 Chronic kidney disease, unspecified: Secondary | ICD-10-CM | POA: Diagnosis not present

## 2021-03-19 DIAGNOSIS — R42 Dizziness and giddiness: Secondary | ICD-10-CM | POA: Diagnosis not present

## 2021-03-19 DIAGNOSIS — R55 Syncope and collapse: Principal | ICD-10-CM

## 2021-03-19 DIAGNOSIS — Z87891 Personal history of nicotine dependence: Secondary | ICD-10-CM | POA: Diagnosis not present

## 2021-03-19 DIAGNOSIS — Z20822 Contact with and (suspected) exposure to covid-19: Secondary | ICD-10-CM | POA: Insufficient documentation

## 2021-03-19 DIAGNOSIS — R404 Transient alteration of awareness: Secondary | ICD-10-CM | POA: Diagnosis not present

## 2021-03-19 DIAGNOSIS — R410 Disorientation, unspecified: Secondary | ICD-10-CM | POA: Diagnosis not present

## 2021-03-19 LAB — CBC WITH DIFFERENTIAL/PLATELET
Abs Immature Granulocytes: 0.07 10*3/uL (ref 0.00–0.07)
Basophils Absolute: 0.1 10*3/uL (ref 0.0–0.1)
Basophils Relative: 0 %
Eosinophils Absolute: 0.3 10*3/uL (ref 0.0–0.5)
Eosinophils Relative: 3 %
HCT: 45.8 % (ref 39.0–52.0)
Hemoglobin: 14.8 g/dL (ref 13.0–17.0)
Immature Granulocytes: 1 %
Lymphocytes Relative: 19 %
Lymphs Abs: 2.3 10*3/uL (ref 0.7–4.0)
MCH: 31.2 pg (ref 26.0–34.0)
MCHC: 32.3 g/dL (ref 30.0–36.0)
MCV: 96.4 fL (ref 80.0–100.0)
Monocytes Absolute: 0.7 10*3/uL (ref 0.1–1.0)
Monocytes Relative: 6 %
Neutro Abs: 8.3 10*3/uL — ABNORMAL HIGH (ref 1.7–7.7)
Neutrophils Relative %: 71 %
Platelets: 203 10*3/uL (ref 150–400)
RBC: 4.75 MIL/uL (ref 4.22–5.81)
RDW: 13.2 % (ref 11.5–15.5)
WBC: 11.7 10*3/uL — ABNORMAL HIGH (ref 4.0–10.5)
nRBC: 0 % (ref 0.0–0.2)

## 2021-03-19 LAB — TROPONIN I (HIGH SENSITIVITY): Troponin I (High Sensitivity): 17 ng/L (ref <18)

## 2021-03-19 LAB — BASIC METABOLIC PANEL
Anion gap: 7 (ref 5–15)
BUN: 33 mg/dL — ABNORMAL HIGH (ref 8–23)
CO2: 27 mmol/L (ref 22–32)
Calcium: 8.9 mg/dL (ref 8.9–10.3)
Chloride: 104 mmol/L (ref 98–111)
Creatinine, Ser: 2.33 mg/dL — ABNORMAL HIGH (ref 0.61–1.24)
GFR, Estimated: 26 mL/min — ABNORMAL LOW (ref >60)
Glucose, Bld: 120 mg/dL — ABNORMAL HIGH (ref 70–99)
Potassium: 3.7 mmol/L (ref 3.5–5.1)
Sodium: 138 mmol/L (ref 135–145)

## 2021-03-19 LAB — CBG MONITORING, ED: Glucose-Capillary: 109 mg/dL — ABNORMAL HIGH (ref 70–99)

## 2021-03-19 IMAGING — DX DG CHEST 1V PORT
1 series · 1 of 1 positions shown · non-contrast
Comparison: [DATE]

CLINICAL DATA: Syncope

EXAM:
PORTABLE CHEST 1 VIEW

[chest ap]
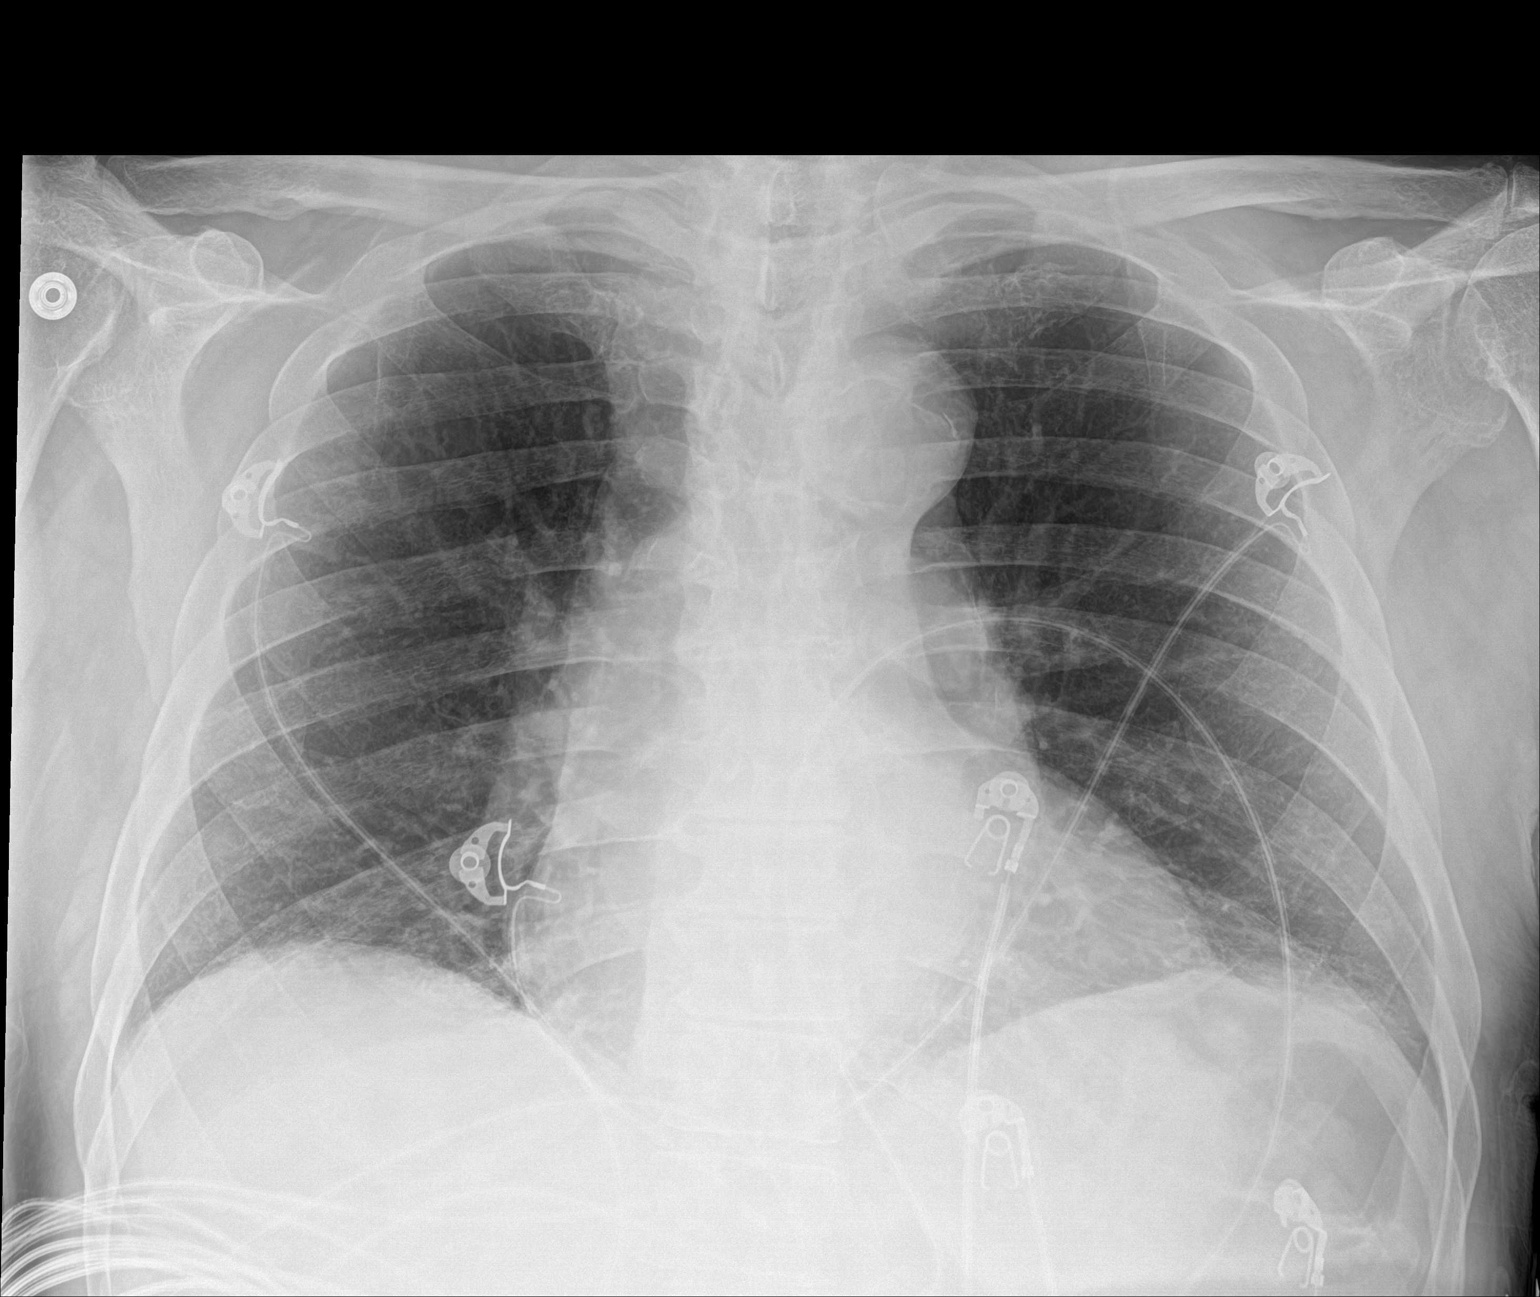

[1 of 1 positions shown; findings below may reference images not displayed]

FINDINGS: Mild left basilar scarring/atelectasis. Right lung is clear. No
pleural effusion or pneumothorax.

Cardiomegaly.  Thoracic aortic atherosclerosis.
IMPRESSION: Mild left basilar scarring/atelectasis.

No evidence of acute cardiopulmonary disease.

## 2021-03-19 IMAGING — CT CT HEAD W/O CM
4 series · 16 of 47 positions shown, 18 images · non-contrast
Comparison: Head CT [DATE]

CLINICAL DATA: Syncope, normal neuro exam.

EXAM:
CT HEAD WITHOUT CONTRAST
TECHNIQUE: Contiguous axial images were obtained from the base of the skull
through the vertex without intravenous contrast.

[Series 3: head without · axial · non-contrast · 0.44mm/px · z∈[-151,-31]mm · 7 of 32 slices shown, 9 images]
[im 4/32  brain]
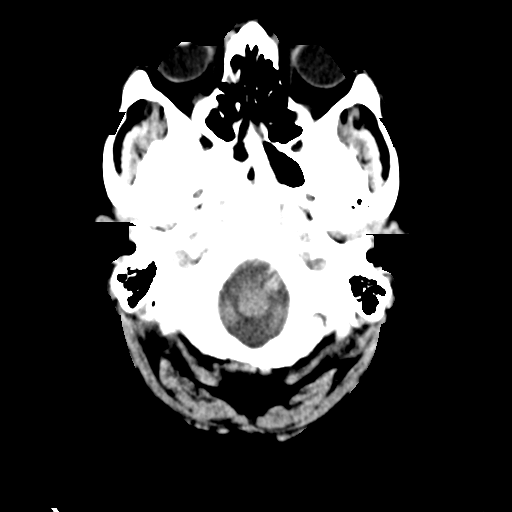
[im 4/32  bone]
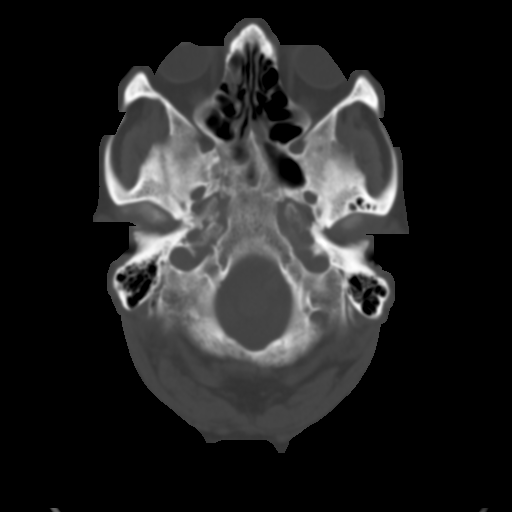
[im 8/32  brain]
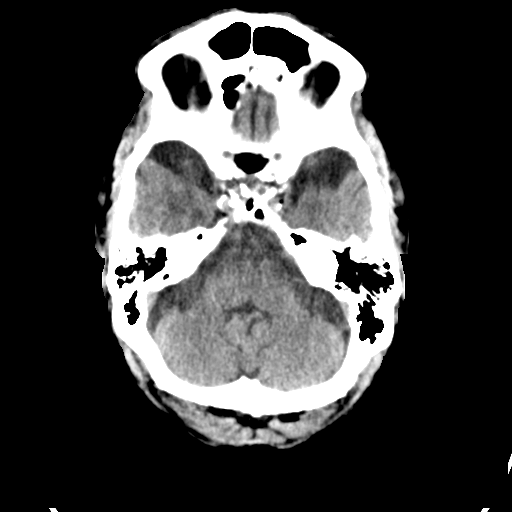
[im 12/32  brain]
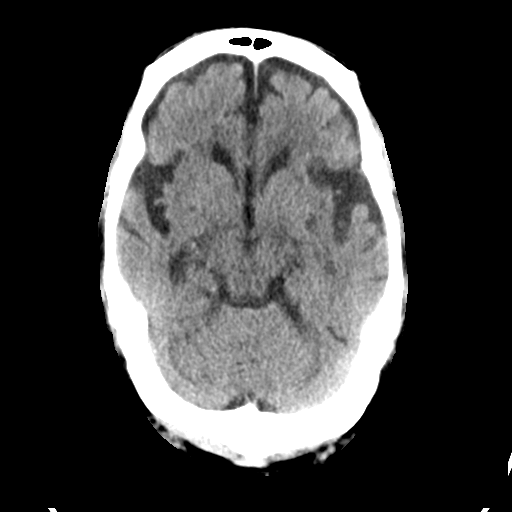
[im 16/32  brain]
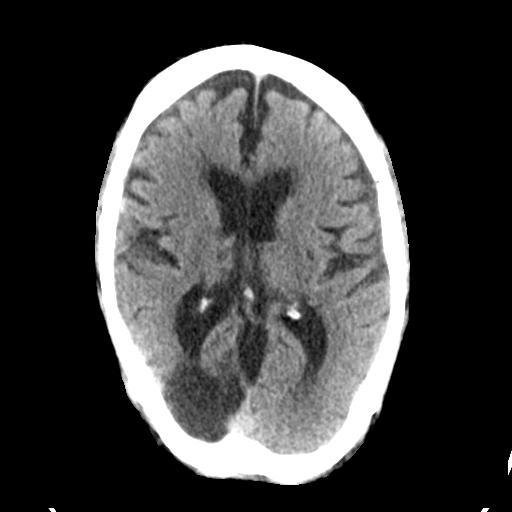
[im 20/32  brain]
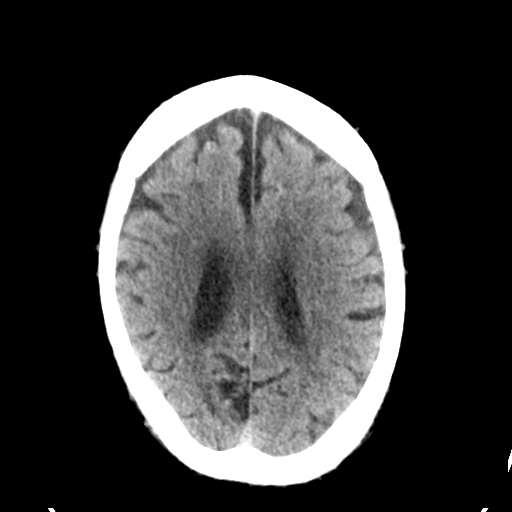
[im 20/32  bone]
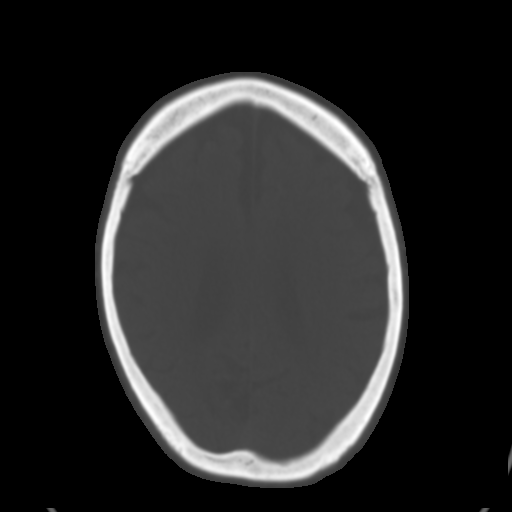
[im 24/32  brain]
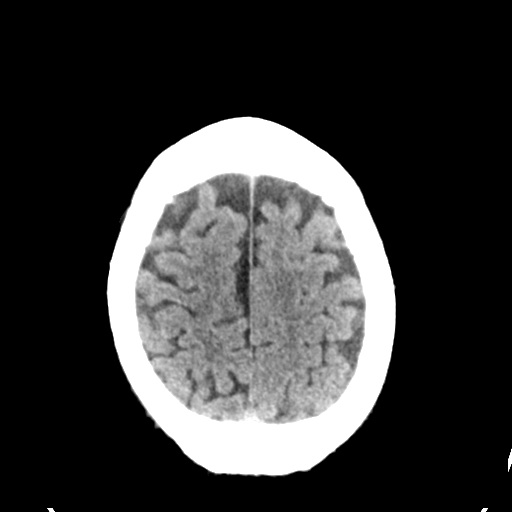
[im 28/32  brain]
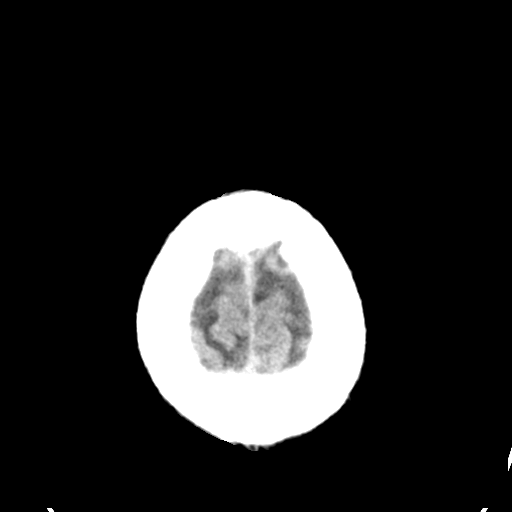

[Series 4: head bone · axial · 0.44mm/px · z∈[-152,-120]mm · 3 of 80 slices shown]
[im 8/80  bone]
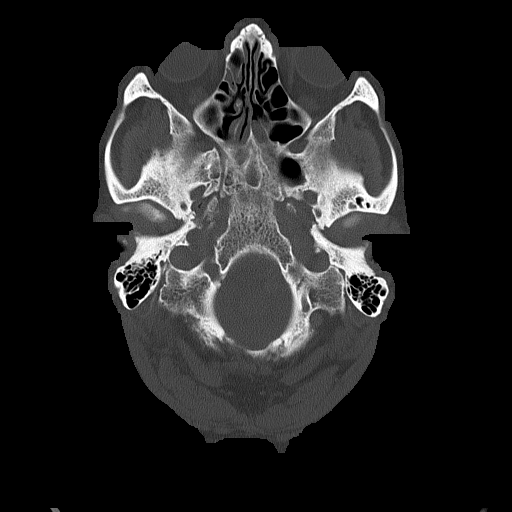
[im 16/80  bone]
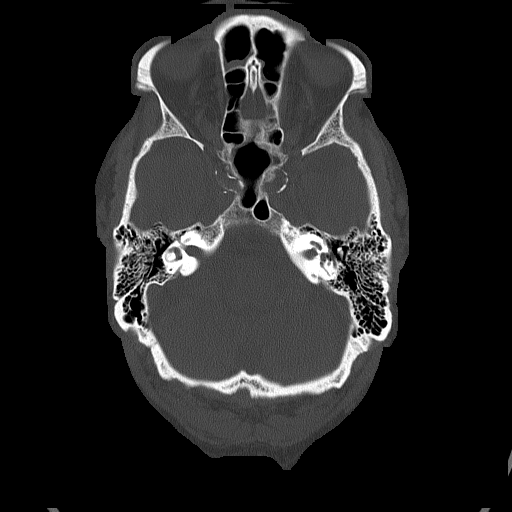
[im 24/80  bone]
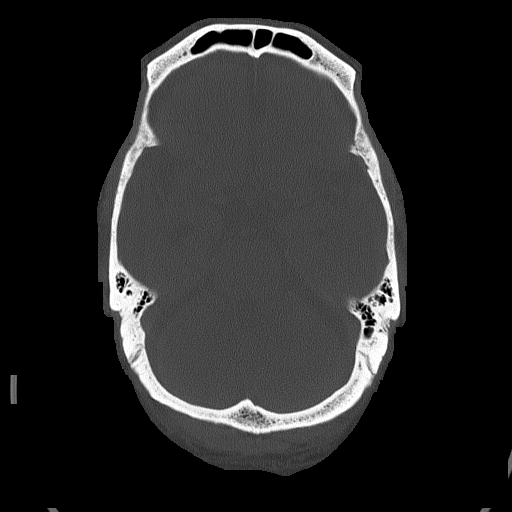

[Series 5: head without cor · coronal · non-contrast · 0.31mm/px · 3 of 70 slices shown]
[im 26/70  brain]
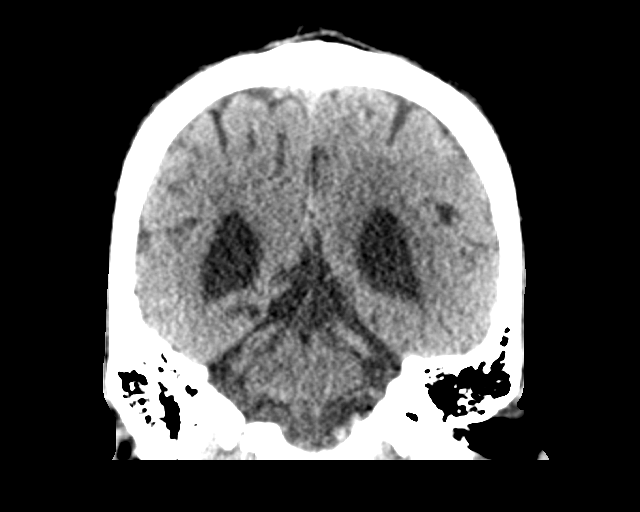
[im 32/70  brain]
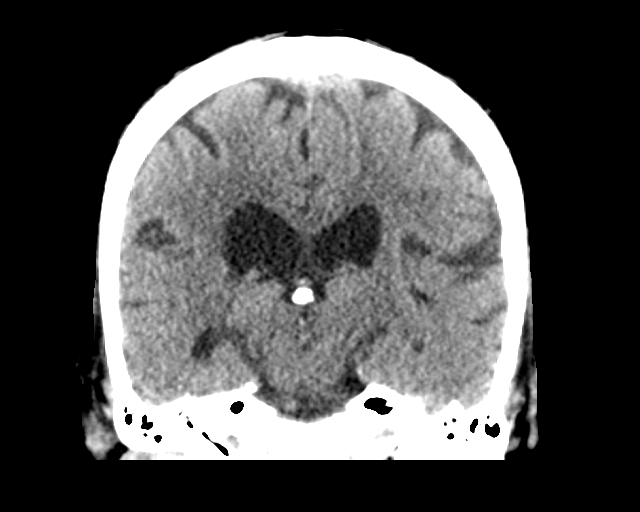
[im 38/70  brain]
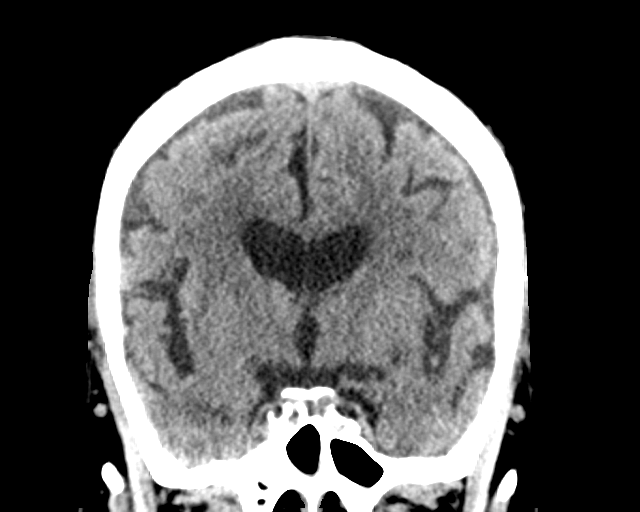

[Series 6: head without sag · sagittal · non-contrast · 0.34mm/px · 3 of 67 slices shown]
[im 23/67  brain]
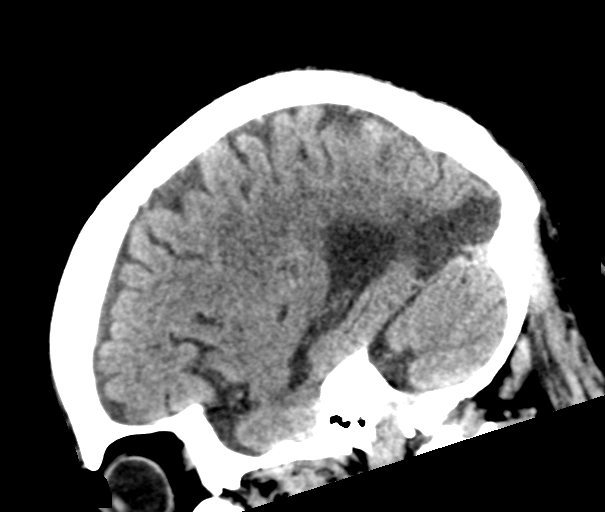
[im 34/67  brain]
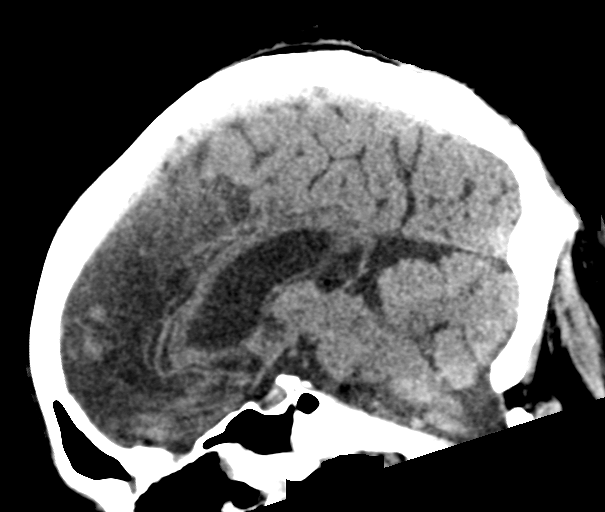
[im 45/67  brain]
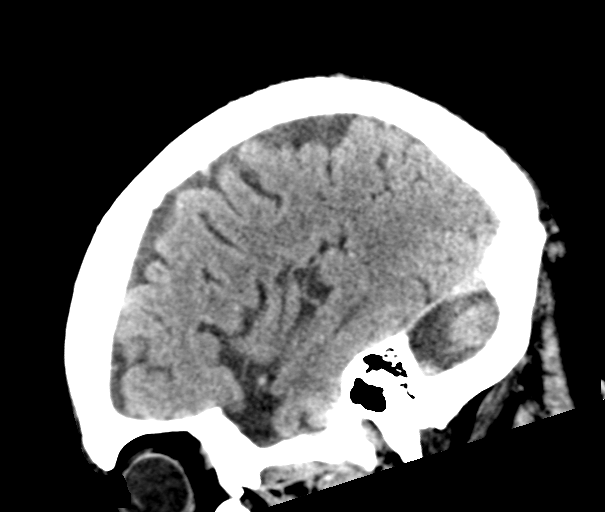

[16 of 47 positions shown; findings below may reference images not displayed]

FINDINGS: Brain: Generalized atrophy is similar to prior exam. There is
moderate periventricular chronic small vessel ischemia. Remote right
occipital infarct is unchanged. Remote lacunar infarct in the left
basal ganglia again seen. No evidence of acute ischemia. No
hydrocephalus or midline shift. No subdural or extra-axial
collection.

Vascular: Atherosclerosis of skullbase vasculature without
hyperdense vessel or abnormal calcification.

Skull: No fracture or focal lesion.

Sinuses/Orbits: Mucosal thickening throughout the paranasal sinuses,
no sinus fluid level. No mastoid effusion. Unremarkable orbits.

Other: None.
IMPRESSION: 1. No acute intracranial abnormality.
2. Unchanged atrophy, chronic small vessel ischemia, and remote
infarcts.

## 2021-03-19 MED ORDER — SODIUM CHLORIDE 0.9 % IV SOLN: 1000.0000 mL | INTRAVENOUS | Status: DC

## 2021-03-19 MED ORDER — SODIUM CHLORIDE 0.9 % IV BOLUS (SEPSIS)
500.0000 mL | Freq: Once | INTRAVENOUS | Status: AC
  Administered 2021-03-19: 500 mL via INTRAVENOUS

## 2021-03-19 MED ORDER — SODIUM CHLORIDE 0.9 % IV SOLN
1000.0000 mL | INTRAVENOUS | Status: DC
  Administered 2021-03-19: 1000 mL via INTRAVENOUS

## 2021-03-19 MED ORDER — SODIUM CHLORIDE 0.9 % IV BOLUS (SEPSIS): 500.0000 mL | Freq: Once | INTRAVENOUS | Status: DC

## 2021-03-19 NOTE — ED Notes (Signed)
Pt overall tolerated orthostatic vital check; however, throughout the assessment patient began to experience some dizziness.

## 2021-03-19 NOTE — ED Provider Notes (Signed)
[facility] EMERGENCY DEPARTMENT Provider Note   CSN: [id number] Arrival date & time: 03/19/21  1853     History CC: Syncope  Brendan Long is a 87 y.o. male.  HPI  Patient presented to the ED for evaluation of a syncopal episode.  Patient states this evening he had an episode where he became confused.  Patient called his grandson stating that he did not know where he was.  Patient did take some Tylenol with codeine that he had been given for pain associated with some recent dental work.  EMS arrived at the patient's house.  Patient indicated that he was feeling fine and he elected to stay home.  Yolanda Bonine was there at the house still 1 about an hour later he had another episode of syncope.  Patient remembers feeling lightheaded.  Grandson said that he was unresponsive and had to do CPR for about 5 minutes.  Patient came back to normal and returned to his baseline.  Patient was diaphoretic at the time.  Pt denies chest pain or shortness of breath.  No fevers or chills.  No numbness or weakness.  No abd pain.    Past Medical History:  Diagnosis Date   ED (erectile dysfunction)    Hyperlipidemia    Hypertension    Hypothyroidism    Psoriasis    Varicose veins    Vitamin D deficiency     Patient Active Problem List   Diagnosis Date Noted   History of CVA (cerebrovascular accident) 05/07/2020   AF (paroxysmal atrial fibrillation) ([place]) 05/07/2020   DM (diabetes mellitus), type 2 with renal complications ([place]) [id number]   AKI (acute kidney injury) ([place]) 05/07/2020   CVA (cerebral vascular accident) ([place]) 05/07/2020   Abnormal echocardiogram 05/07/2020   CKD (chronic kidney disease) stage 3, GFR 30-59 ml/min ([place]) 05/07/2020   Acute CVA (cerebrovascular accident) ([place])    Stroke ([place]) 05/06/2020   IGT (impaired glucose tolerance) 12/09/2019   Dizziness 03/18/2019   Neck swelling 07/26/2018   Elevated PSA 12/16/2014   Hypokalemia 12/16/2014   Hyperglycemia  12/15/2013   Renal insufficiency 12/15/2013   Dyspnea 12/15/2013   Hand pain, right 12/15/2013   Screening for cancer 12/05/2012   Encounter for long-term (current) use of other medications 12/05/2011   Wellness examination 12/05/2011   Osteoarthritis 12/05/2011   Cough 12/05/2011   ERECTILE DYSFUNCTION, NON-ORGANIC 11/09/2009   Vitamin D deficiency 08/13/2008   VARICOSE VEINS, LOWER EXTREMITIES 08/13/2008   UNS ADVRS EFF OTH RX MEDICINAL&BIOLOGICAL SBSTNC 08/13/2008   Hypothyroidism 05/02/2007   PSORIASIS 05/02/2007   Dyslipidemia 03/12/2007   Essential hypertension 03/12/2007    Past Surgical History:  Procedure Laterality Date   JOINT REPLACEMENT     joint replacement rt and left     rotator cuff repair, right shoulder per Dr. Tonita Cong  1996   torn right bicepts muscle  [place]  01/17/10   redone on right hip on 09/05/10       Family History  Problem Relation Age of Onset   Breast cancer Other     Social History   Tobacco Use   Smoking status: Former   Smokeless tobacco: Never  Vaping Use   Vaping Use: Never used  Substance Use Topics   Alcohol use: No   Drug use: No    Home Medications Prior to Admission medications   Medication Sig Start Date End Date Taking? Authorizing Provider  apixaban (ELIQUIS) 5 MG TABS tablet Take 1  tablet (5 mg total) by mouth 2 (two) times daily. 05/08/20   Little Ishikawa, MD  atorvastatin (LIPITOR) 80 MG tablet TAKE 1 TABLET DAILY 07/24/17   Renato Shin, MD  budesonide-formoterol [facility]) 80-4.5 MCG/ACT inhaler Inhale 2 puffs into the lungs 2 (two) times daily. Patient not taking: Reported on 05/06/2020 12/03/17   Renato Shin, MD  clobetasol ointment (TEMOVATE) 0.05 % APPLY TOPICALLY TWICE A DAY Patient taking differently: Apply 1 application topically 2 (two) times daily as needed (rash).  10/21/18   Isaac Bliss, Rayford Halsted, MD  diclofenac sodium (VOLTAREN) 1 % GEL APPLY 4 GRAMS TOPICALLY FOUR TIMES  A DAY AS NEEDED FOR KNEE PAIN Patient taking differently: Apply 4 g topically 4 (four) times daily as needed (pain).  04/22/18   Renato Shin, MD  omega-3 acid ethyl esters (LOVAZA) 1 G capsule Take 2 capsules (2 g total) by mouth 2 (two) times daily. 03/11/13 03/11/14  Renato Shin, MD  polyethylene glycol (MIRALAX / GLYCOLAX) 17 g packet Take 17 g by mouth daily as needed for severe constipation. Patient not taking: Reported on 05/06/2020 03/21/19   Monica Becton, MD  sildenafil (VIAGRA) 100 MG tablet TAKE 1 TABLET BY MOUTH 1 HOUR BEFORE NEEDED FOR ERECTILE DYSFUNCTION 10/08/20   Isaac Bliss, Rayford Halsted, MD  TRICOR 48 MG tablet TAKE 1 TABLET DAILY 07/15/19   Isaac Bliss, Rayford Halsted, MD  Vitamin D, Ergocalciferol, (DRISDOL) 1.25 MG (50000 UNIT) CAPS capsule TAKE 1 CAPSULE EVERY 7 DAYS FOR 12 DOSES Patient not taking: Reported on 05/06/2020 02/13/20   Isaac Bliss, Rayford Halsted, MD    Allergies    Patient has no known allergies.  Review of Systems   Review of Systems  All other systems reviewed and are negative.  Physical Exam Updated Vital Signs BP (!) 171/96 (BP Location: Left Arm)   Pulse 64   Temp 97.6 F (36.4 C) (Oral)   Resp 14   Ht 1.791 m (5' 10.5")   Wt 78 kg   SpO2 98%   BMI 24.33 kg/m   Physical Exam Vitals and nursing note reviewed.  Constitutional:      General: He is not in acute distress.    Appearance: He is well-developed.  HENT:     Head: Normocephalic and atraumatic.     Right Ear: External ear normal.     Left Ear: External ear normal.  Eyes:     General: No scleral icterus.       Right eye: No discharge.        Left eye: No discharge.     Conjunctiva/sclera: Conjunctivae normal.  Neck:     Trachea: No tracheal deviation.  Cardiovascular:     Rate and Rhythm: Normal rate and regular rhythm.  Pulmonary:     Effort: Pulmonary effort is normal. No respiratory distress.     Breath sounds: Normal breath sounds. No stridor. No wheezing or rales.   Abdominal:     General: Bowel sounds are normal. There is no distension.     Palpations: Abdomen is soft.     Tenderness: There is no abdominal tenderness. There is no guarding or rebound.  Musculoskeletal:        General: No tenderness or deformity.     Cervical back: Neck supple.  Skin:    General: Skin is warm and dry.     Findings: No rash.  Neurological:     General: No focal deficit present.     Mental Status: He is  alert.     Cranial Nerves: No cranial nerve deficit (no facial droop, extraocular movements intact, no slurred speech).     Sensory: No sensory deficit.     Motor: No abnormal muscle tone or seizure activity.     Coordination: Coordination normal.  Psychiatric:        Mood and Affect: Mood normal.    ED Results / Procedures / Treatments   Labs (all labs ordered are listed, but only abnormal results are displayed) Labs Reviewed  BASIC METABOLIC PANEL - Abnormal; Notable for the following components:      Result Value   Glucose, Bld 120 (*)    BUN 33 (*)    Creatinine, Ser 2.33 (*)    GFR, Estimated 26 (*)    All other components within normal limits  CBC WITH DIFFERENTIAL/PLATELET - Abnormal; Notable for the following components:   WBC 11.7 (*)    Neutro Abs 8.3 (*)    All other components within normal limits  CBG MONITORING, ED - Abnormal; Notable for the following components:   Glucose-Capillary 109 (*)    All other components within normal limits  SARS CORONAVIRUS 2 (TAT 6-24 HRS)  TROPONIN I (HIGH SENSITIVITY)  TROPONIN I (HIGH SENSITIVITY)    EKG EKG Interpretation  Date/Time:  Saturday March 19 2021 19:00:34 EDT Ventricular Rate:  59 PR Interval:  271 QRS Duration: 149 QT Interval:  470 QTC Calculation: 466 R Axis:   -17 Text Interpretation: Sinus rhythm Prolonged PR interval Right bundle branch block new since last tracing Confirmed by Dorie Rank [phone]) on 03/19/2021 7:03:44 PM  Radiology DG Chest Port 1 View  Result Date:  03/19/2021 CLINICAL DATA:  Syncope EXAM: PORTABLE CHEST 1 VIEW COMPARISON:  05/06/2020 FINDINGS: Mild left basilar scarring/atelectasis. Right lung is clear. No pleural effusion or pneumothorax. Cardiomegaly.  Thoracic aortic atherosclerosis. IMPRESSION: Mild left basilar scarring/atelectasis. No evidence of acute cardiopulmonary disease. Electronically Signed   By: Julian Hy M.D.   On: 03/19/2021 20:43    Procedures Procedures   Medications Ordered in ED Medications  sodium chloride 0.9 % bolus 500 mL (has no administration in time range)    Followed by  0.9 %  sodium chloride infusion (has no administration in time range)  sodium chloride 0.9 % bolus 500 mL (has no administration in time range)    Followed by  0.9 %  sodium chloride infusion (has no administration in time range)    ED Course  I have reviewed the triage vital signs and the nursing notes.  Pertinent labs & imaging results that were available during my care of the patient were reviewed by me and considered in my medical decision making (see chart for details).    MDM Rules/Calculators/A&P                           Patient presented to the ED for evaluation of a syncopal episode.  Patient had an episode of unresponsiveness and ended up having bystander CPR.  Patient did not any focal numbness or weakness now.  No chest pain or shortness of breath.  ED work-up shows an elevation in BUN and creatinine.  Initial troponin does not show any signs of acute cardiac ischemia.  No dysrhythmia noted in the ED.  We will proceed with IV fluids.  With his syncope and unresponsiveness I think he warrants overnight observation and cardiac monitoring.  Consult the medical service. Final Clinical Impression(s) / ED  Diagnoses Final diagnoses:  Syncope, unspecified syncope type  AKI (acute kidney injury) ([place])     Dorie Rank, MD 03/19/21 2141

## 2021-03-19 NOTE — ED Triage Notes (Signed)
Pt [place] from home d/t syncope episodes. Per ems, they got dispatched to pt's house twice. The first time, pt refused ems service because pt returned back to normal baseline after the syncope episode. An hour later, EMS got dispatched again for another syncope episode which the grandson had to do CPR on the pt for about 5 mins. Pt appeared diaphoretic, pale and confused.  Pt also had dental procedures done few days ago. Pt does not remember the event.   Meds given by ems :   -500 ml NS.   Ems vitals:   BP 140/86  HR 84  98% RA  CBG 146

## 2021-03-19 NOTE — H&P (Signed)
History and Physical    Brendan Long [id number] DOB: March 15, 1929 DOA: 03/19/2021  PCP: Isaac Bliss, Rayford Halsted, MD   Patient coming from: Home  Chief Complaint: syncope  HPI: Brendan Long is a 87 y.o. male with medical history significant for HTN, HLD, hx of TIA who presents by EMS after having a syncopal event at home.  Earlier in the day he had an episode of confusion that was witnessed by his grandson.  He suddenly became confused and was not sure what he was doing where he was going.  He has been taking Tylenol with codeine for the last 2 days after having recent dental work.  EMS was called but by time EMS arrived patient reported he was feeling fine to go to the hospital.  Approximate hour later he stood up from his chair when he again had a brief period of confusion and then had a syncopal episode that was witnessed by his grandson.  He states he remembers feeling lightheaded but he is not have any chest pain or palpitations.  Brendan Long reported the ER physician that he was unresponsive and like he was struggling to breathe so he performed bystander CPR for 4 to 5 minutes before EMS came.  Patient was again at his baseline and alert and oriented when EMS arrived but appeared diaphoretic so he was brought to the hospital with this being his second event of the day.  He denies any fevers chills, change in taste or smell.  He denies any slurred speech, visual change, numbness or weakness of extremities.  He has not had any abdominal pain, nausea, vomiting, diarrhea.  He does report that earlier in the day he felt like it was hard to start a urinary stream and he had to push and strain more than usual but that was a few hours before these events occurred.  ED Course: Patient has had elevated blood pressures in the emergency room.  He is alert and oriented with no neurological findings.  Lab work revealed AKI on chronic kidney disease creatinine of 2.33 from a baseline of 1.78.   Electrolytes were otherwise normal.  White blood cell count is mild elevated 11,700 otherwise CBC is unremarkable.  Initial troponin was 17.  Hospitalist service was asked to evaluate and manage patient overnight  Review of Systems:  General: Denies fever, chills, weight loss, night sweats.  Denies dizziness.  Denies change in appetite HENT: Denies head trauma, headache, denies change in hearing, tinnitus.  Denies nasal congestion.  Denies sore throat.  Denies difficulty swallowing Eyes: Denies blurry vision, pain in eye, drainage.  Denies discoloration of eyes. Neck: Denies pain.  Denies swelling.  Denies pain with movement. Cardiovascular: Denies chest pain, palpitations.  Denies edema.  Denies orthopnea Respiratory: Denies shortness of breath, cough.  Denies wheezing.  Denies sputum production Gastrointestinal: Denies abdominal pain, swelling.  Denies nausea, vomiting, diarrhea.  Denies melena.  Denies hematemesis. Musculoskeletal: Denies limitation of movement.  Denies deformity or swelling. Denies arthralgias or myalgias. Genitourinary: Denies pelvic pain.  Denies urinary frequency or hesitancy.  Denies dysuria.  Skin: Denies rash.  Denies petechiae, purpura, ecchymosis. Neurological: Denies seizure activity. Denies  paresthesia.  Denies slurred speech, drooping face.  Denies visual change. Psychiatric: Denies depression, anxiety.  Denies hallucinations.  Past Medical History:  Diagnosis Date   ED (erectile dysfunction)    Hyperlipidemia    Hypertension    Hypothyroidism    Psoriasis    Varicose veins    Vitamin D  deficiency     Past Surgical History:  Procedure Laterality Date   JOINT REPLACEMENT     joint replacement rt and left     rotator cuff repair, right shoulder per Dr. Tonita Cong  1996   torn right bicepts muscle  [place]  01/17/10   redone on right hip on 09/05/10    Social History  reports that he has quit smoking. He has never used smokeless  tobacco. He reports that he does not drink alcohol and does not use drugs.  No Known Allergies  Family History  Problem Relation Age of Onset   Breast cancer Other      Prior to Admission medications   Medication Sig Start Date End Date Taking? Authorizing Provider  apixaban (ELIQUIS) 5 MG TABS tablet Take 1 tablet (5 mg total) by mouth 2 (two) times daily. 05/08/20   Little Ishikawa, MD  atorvastatin (LIPITOR) 80 MG tablet TAKE 1 TABLET DAILY 07/24/17   Renato Shin, MD  budesonide-formoterol [facility]) 80-4.5 MCG/ACT inhaler Inhale 2 puffs into the lungs 2 (two) times daily. Patient not taking: Reported on 05/06/2020 12/03/17   Renato Shin, MD  clobetasol ointment (TEMOVATE) 0.05 % APPLY TOPICALLY TWICE A DAY Patient taking differently: Apply 1 application topically 2 (two) times daily as needed (rash).  10/21/18   Isaac Bliss, Rayford Halsted, MD  diclofenac sodium (VOLTAREN) 1 % GEL APPLY 4 GRAMS TOPICALLY FOUR TIMES A DAY AS NEEDED FOR KNEE PAIN Patient taking differently: Apply 4 g topically 4 (four) times daily as needed (pain).  04/22/18   Renato Shin, MD  omega-3 acid ethyl esters (LOVAZA) 1 G capsule Take 2 capsules (2 g total) by mouth 2 (two) times daily. 03/11/13 03/11/14  Renato Shin, MD  polyethylene glycol (MIRALAX / GLYCOLAX) 17 g packet Take 17 g by mouth daily as needed for severe constipation. Patient not taking: Reported on 05/06/2020 03/21/19   Monica Becton, MD  sildenafil (VIAGRA) 100 MG tablet TAKE 1 TABLET BY MOUTH 1 HOUR BEFORE NEEDED FOR ERECTILE DYSFUNCTION 10/08/20   Isaac Bliss, Rayford Halsted, MD  TRICOR 48 MG tablet TAKE 1 TABLET DAILY 07/15/19   Isaac Bliss, Rayford Halsted, MD  Vitamin D, Ergocalciferol, (DRISDOL) 1.25 MG (50000 UNIT) CAPS capsule TAKE 1 CAPSULE EVERY 7 DAYS FOR 12 DOSES Patient not taking: Reported on 05/06/2020 02/13/20   Erline Hau, MD    Physical Exam: Vitals:   03/19/21 1940 03/19/21 2035 03/19/21 2110 03/19/21 2130   BP: (!) 160/102  (!) 171/96 (!) 171/96  Pulse: 62 61 64 (!) 59  Resp: '13 14 14 12  '$ Temp: (!) 97.5 F (36.4 C)  97.6 F (36.4 C) 98 F (36.7 C)  TempSrc: Oral  Oral Oral  SpO2: 91% 99% 98% 99%  Weight: 78 kg     Height: 5' 10.5" (1.791 m)       Constitutional: NAD, calm, comfortable Vitals:   03/19/21 1940 03/19/21 2035 03/19/21 2110 03/19/21 2130  BP: (!) 160/102  (!) 171/96 (!) 171/96  Pulse: 62 61 64 (!) 59  Resp: '13 14 14 12  '$ Temp: (!) 97.5 F (36.4 C)  97.6 F (36.4 C) 98 F (36.7 C)  TempSrc: Oral  Oral Oral  SpO2: 91% 99% 98% 99%  Weight: 78 kg     Height: 5' 10.5" (1.791 m)      General: WDWN, Alert and oriented x3.  Eyes: EOMI, PERRL, conjunctivae normal.  Sclera nonicteric HENT:  [place]/AT,  external ears normal.  Nares patent without epistasis.  Mucous membranes are moist. Posterior pharynx clear. Dentures in place Neck: Soft, normal range of motion, supple, no masses, no thyromegaly.  Trachea midline Respiratory: clear to auscultation bilaterally, no wheezing, no crackles. Normal respiratory effort. No accessory muscle use.  Cardiovascular: Regular rate and rhythm, no murmurs / rubs / gallops. No extremity edema. 2+ pedal pulses. No carotid bruits.  Abdomen: Soft, no tenderness, nondistended, no rebound or guarding.  No masses palpated. Bowel sounds normoactive Musculoskeletal: FROM. no cyanosis. No joint deformity upper and lower extremities. Normal muscle tone.  Skin: Warm, dry, intact no rashes, lesions, ulcers. No induration.  Normal skin turgor Neurologic: CN 2-12 grossly intact.  Normal speech.  Sensation intact, patella DTR +1 bilaterally. Strength 5/5 in all extremities.  No tremor.  No pronator drift.  No asymmetry of face.  Tongue with normal extension without deviation Psychiatric: Normal judgment and insight.  Normal mood.    Labs on Admission: I have personally reviewed following labs and imaging studies  CBC: Recent Labs  Lab 03/19/21 1931  WBC  11.7*  NEUTROABS 8.3*  HGB 14.8  HCT 45.8  MCV 96.4  PLT 123456    Basic Metabolic Panel: Recent Labs  Lab 03/19/21 1931  NA 138  K 3.7  CL 104  CO2 27  GLUCOSE 120*  BUN 33*  CREATININE 2.33*  CALCIUM 8.9    GFR: Estimated Creatinine Clearance: 22.1 mL/min (A) (by C-G formula based on SCr of 2.33 mg/dL (H)).  Liver Function Tests: No results for input(s): AST, ALT, ALKPHOS, BILITOT, PROT, ALBUMIN in the last 168 hours.  Urine analysis:    Component Value Date/Time   COLORURINE YELLOW 05/06/2020 2255   APPEARANCEUR CLEAR 05/06/2020 2255   LABSPEC 1.019 05/06/2020 2255   PHURINE 6.0 05/06/2020 2255   GLUCOSEU NEGATIVE 05/06/2020 2255   GLUCOSEU NEGATIVE 12/15/2016 1015   HGBUR NEGATIVE 05/06/2020 2255   HGBUR negative 11/09/2009 0000   BILIRUBINUR NEGATIVE 05/06/2020 2255   BILIRUBINUR n 11/15/2010 [place] 05/06/2020 2255   PROTEINUR 30 (A) 05/06/2020 2255   UROBILINOGEN 0.2 12/15/2016 1015   NITRITE NEGATIVE 05/06/2020 2255   LEUKOCYTESUR NEGATIVE 05/06/2020 2255    Radiological Exams on Admission: DG Chest Port 1 View  Result Date: 03/19/2021 CLINICAL DATA:  Syncope EXAM: PORTABLE CHEST 1 VIEW COMPARISON:  05/06/2020 FINDINGS: Mild left basilar scarring/atelectasis. Right lung is clear. No pleural effusion or pneumothorax. Cardiomegaly.  Thoracic aortic atherosclerosis. IMPRESSION: Mild left basilar scarring/atelectasis. No evidence of acute cardiopulmonary disease. Electronically Signed   By: Julian Hy M.D.   On: 03/19/2021 20:43    EKG: Independently reviewed.  EKG shows normal sinus rhythm with right bundle branch block.  QTc 466.  No acute ST elevation or depression  Assessment/Plan Principal Problem:   Acute kidney injury superimposed on CKD Brendan Long is placed on medical telemetry for observation.  Given normal saline IV fluid in the emergency room.  Patient is changed to LR IV fluid hydration overnight. Recheck electrolytes  and renal function in morning with labs  Active Problems:   Syncope Patient with syncopal event at home.  According to the report given by grandson to the ER physician looked very pale and had looked to have difficulty breathing so he performed bystander CPR.  When EMS arrived patient had a pulse and was breathing on his own. Reportedly he had stood up from his chair when the syncopal event occurred.  Is unclear if  patient had a vasovagal reaction versus an arrhythmia.  Patient thinks event may have been related to the Tylenol with codeine he has been taking for the last 2 days since he had teeth pulled Patient does report that he felt very confused when he first stood up and was not sure where he was or what he was doing but when he woke up after the syncopal event he knew who he was where he was and who his grandson was.  No report of any seizure activity Given 81 mg aspirin    Essential hypertension Started on Coreg.  First dose given now.  Monitor blood pressure    Confusion Patient with confusion that has now resolved.  Patient is alert and oriented x3.  Will obtain CT scan to make sure there is no acute intracranial process.  Patient does have history of TIAs in past    DVT prophylaxis: SCDs for DVT prophylaxis while ruling out CVA/TIA  Code Status:   Full Code  Family Communication:  Diagnosis and plan discussed with patient.  Patient verbalized understanding agrees with plan.  Further recommendations to follow as clinically indicated Disposition Plan:   Patient is from:  Home  Anticipated DC to:  Home  Anticipated DC date:  Anticipate less than 2 midnight stay   Admission status:  Observation   Yevonne Aline Yaffa Seckman MD [facility]  How to contact the [facility] Attending or Consulting provider [place] or covering provider during after hours [place], for this patient?   Check the care team in [facility] and look for a) attending/consulting [facility] provider listed and b) the [facility] team  listed Log into [email or web address] and use [place]'s universal password to access. If you do not have the password, please contact the hospital operator. Locate the [facility] provider you are looking for under [facility] and page to a number that you can be directly reached. If you still have difficulty reaching the provider, please page the [facility] (Director on Call) for the Hospitalists listed on amion for assistance.  03/19/2021, 10:47 PM

## 2021-03-20 ENCOUNTER — Other Ambulatory Visit: Payer: Self-pay

## 2021-03-20 DIAGNOSIS — R55 Syncope and collapse: Secondary | ICD-10-CM | POA: Diagnosis not present

## 2021-03-20 LAB — SARS CORONAVIRUS 2 (TAT 6-24 HRS): SARS Coronavirus 2: NEGATIVE

## 2021-03-20 LAB — TROPONIN I (HIGH SENSITIVITY): Troponin I (High Sensitivity): 18 ng/L — ABNORMAL HIGH (ref <18)

## 2021-03-20 MED ORDER — LACTATED RINGERS IV SOLN: INTRAVENOUS | Status: DC

## 2021-03-20 MED ORDER — HYDRALAZINE HCL 25 MG PO TABS: 25.0000 mg | ORAL_TABLET | Freq: Four times a day (QID) | ORAL | Status: DC | PRN

## 2021-03-20 MED ORDER — ASPIRIN EC 81 MG PO TBEC
81.0000 mg | DELAYED_RELEASE_TABLET | Freq: Every day | ORAL | Status: DC
  Filled 2021-03-20: qty 1

## 2021-03-20 MED ORDER — AMLODIPINE BESYLATE 5 MG PO TABS
5.0000 mg | ORAL_TABLET | Freq: Every day | ORAL | Status: DC
  Administered 2021-03-20: 5 mg via ORAL
  Filled 2021-03-20 (×2): qty 1

## 2021-03-20 MED ORDER — CARVEDILOL 3.125 MG PO TABS
6.2500 mg | ORAL_TABLET | Freq: Two times a day (BID) | ORAL | Status: DC
  Administered 2021-03-20: 6.25 mg via ORAL
  Filled 2021-03-20: qty 2

## 2021-03-20 MED ORDER — SENNOSIDES-DOCUSATE SODIUM 8.6-50 MG PO TABS: 1.0000 | ORAL_TABLET | Freq: Every evening | ORAL | Status: DC | PRN

## 2021-03-20 MED ORDER — ATORVASTATIN CALCIUM 80 MG PO TABS
80.0000 mg | ORAL_TABLET | Freq: Every day | ORAL | Status: DC
  Filled 2021-03-20: qty 2

## 2021-03-20 MED ORDER — ACETAMINOPHEN 325 MG PO TABS: 650.0000 mg | ORAL_TABLET | Freq: Four times a day (QID) | ORAL | Status: DC | PRN

## 2021-03-20 MED ORDER — ACETAMINOPHEN 650 MG RE SUPP: 650.0000 mg | Freq: Four times a day (QID) | RECTAL | Status: DC | PRN

## 2021-03-20 MED ORDER — METOPROLOL TARTRATE 25 MG PO TABS
50.0000 mg | ORAL_TABLET | Freq: Two times a day (BID) | ORAL | Status: DC
  Administered 2021-03-20: 50 mg via ORAL
  Filled 2021-03-20: qty 2

## 2021-03-20 NOTE — ED Notes (Signed)
Patient  Will be signing himself out AMA. MD Notified.

## 2021-03-20 NOTE — ED Notes (Signed)
Patient wants to be discharged.  Explained that his doctor was not ready to discharge him because of his kidney function.  Patient states that he still wants to go home and that he sees a kidney doctor.

## 2021-03-20 NOTE — ED Notes (Signed)
Breakfast Ordered 

## 2021-03-20 NOTE — ED Notes (Signed)
Lunch tray ordered 

## 2021-04-17 DIAGNOSIS — M7989 Other specified soft tissue disorders: Secondary | ICD-10-CM | POA: Diagnosis not present

## 2021-04-17 DIAGNOSIS — Y9389 Activity, other specified: Secondary | ICD-10-CM | POA: Diagnosis not present

## 2021-04-17 DIAGNOSIS — S43402A Unspecified sprain of left shoulder joint, initial encounter: Secondary | ICD-10-CM | POA: Diagnosis not present

## 2021-04-17 DIAGNOSIS — S50312A Abrasion of left elbow, initial encounter: Secondary | ICD-10-CM | POA: Diagnosis not present

## 2021-04-17 DIAGNOSIS — S80212A Abrasion, left knee, initial encounter: Secondary | ICD-10-CM | POA: Diagnosis not present

## 2021-04-17 DIAGNOSIS — E785 Hyperlipidemia, unspecified: Secondary | ICD-10-CM | POA: Diagnosis not present

## 2021-04-17 DIAGNOSIS — I10 Essential (primary) hypertension: Secondary | ICD-10-CM | POA: Diagnosis not present

## 2021-04-17 DIAGNOSIS — M1990 Unspecified osteoarthritis, unspecified site: Secondary | ICD-10-CM | POA: Diagnosis not present

## 2021-04-17 DIAGNOSIS — W010XXA Fall on same level from slipping, tripping and stumbling without subsequent striking against object, initial encounter: Secondary | ICD-10-CM | POA: Diagnosis not present

## 2021-04-17 DIAGNOSIS — Y9289 Other specified places as the place of occurrence of the external cause: Secondary | ICD-10-CM | POA: Diagnosis not present

## 2021-04-17 DIAGNOSIS — R519 Headache, unspecified: Secondary | ICD-10-CM | POA: Diagnosis not present

## 2021-04-17 DIAGNOSIS — R296 Repeated falls: Secondary | ICD-10-CM | POA: Diagnosis not present

## 2021-04-17 DIAGNOSIS — M25512 Pain in left shoulder: Secondary | ICD-10-CM | POA: Diagnosis not present

## 2021-04-17 DIAGNOSIS — S40211A Abrasion of right shoulder, initial encounter: Secondary | ICD-10-CM | POA: Diagnosis not present

## 2021-04-17 DIAGNOSIS — S01412A Laceration without foreign body of left cheek and temporomandibular area, initial encounter: Secondary | ICD-10-CM | POA: Diagnosis not present

## 2021-05-19 ENCOUNTER — Encounter: Payer: Self-pay | Admitting: Family Medicine

## 2021-05-19 ENCOUNTER — Ambulatory Visit (INDEPENDENT_AMBULATORY_CARE_PROVIDER_SITE_OTHER): Payer: Medicare Other | Admitting: Family Medicine

## 2021-05-19 VITALS — BP 148/108 | HR 64 | Temp 98.7°F | Wt 170.0 lb

## 2021-05-19 DIAGNOSIS — B379 Candidiasis, unspecified: Secondary | ICD-10-CM

## 2021-05-19 DIAGNOSIS — M791 Myalgia, unspecified site: Secondary | ICD-10-CM

## 2021-05-19 DIAGNOSIS — E538 Deficiency of other specified B group vitamins: Secondary | ICD-10-CM | POA: Diagnosis not present

## 2021-05-19 LAB — MAGNESIUM: Magnesium: 2 mg/dL (ref 1.5–2.5)

## 2021-05-19 LAB — CBC WITH DIFFERENTIAL/PLATELET
Basophils Absolute: 0 10*3/uL (ref 0.0–0.1)
Basophils Relative: 0.6 % (ref 0.0–3.0)
Eosinophils Absolute: 0.4 10*3/uL (ref 0.0–0.7)
Eosinophils Relative: 4.9 % (ref 0.0–5.0)
HCT: 43.2 % (ref 39.0–52.0)
Hemoglobin: 14.4 g/dL (ref 13.0–17.0)
Lymphocytes Relative: 23.9 % (ref 12.0–46.0)
Lymphs Abs: 2 10*3/uL (ref 0.7–4.0)
MCHC: 33.4 g/dL (ref 30.0–36.0)
MCV: 94.4 fl (ref 78.0–100.0)
Monocytes Absolute: 0.5 10*3/uL (ref 0.1–1.0)
Monocytes Relative: 5.8 % (ref 3.0–12.0)
Neutro Abs: 5.4 10*3/uL (ref 1.4–7.7)
Neutrophils Relative %: 64.8 % (ref 43.0–77.0)
Platelets: 185 10*3/uL (ref 150.0–400.0)
RBC: 4.58 Mil/uL (ref 4.22–5.81)
RDW: 13.7 % (ref 11.5–15.5)
WBC: 8.3 10*3/uL (ref 4.0–10.5)

## 2021-05-19 LAB — BASIC METABOLIC PANEL
BUN: 25 mg/dL — ABNORMAL HIGH (ref 6–23)
CO2: 31 mEq/L (ref 19–32)
Calcium: 9.3 mg/dL (ref 8.4–10.5)
Chloride: 104 mEq/L (ref 96–112)
Creatinine, Ser: 1.64 mg/dL — ABNORMAL HIGH (ref 0.40–1.50)
GFR: 36.6 mL/min — ABNORMAL LOW (ref >60.00)
Glucose, Bld: 93 mg/dL (ref 70–99)
Potassium: 4.1 mEq/L (ref 3.5–5.1)
Sodium: 140 mEq/L (ref 135–145)

## 2021-05-19 LAB — HEPATIC FUNCTION PANEL
ALT: 9 U/L (ref 0–53)
AST: 15 U/L (ref 0–37)
Albumin: 4 g/dL (ref 3.5–5.2)
Alkaline Phosphatase: 77 U/L (ref 39–117)
Bilirubin, Direct: 0.3 mg/dL (ref 0.0–0.3)
Total Bilirubin: 1.7 mg/dL — ABNORMAL HIGH (ref 0.2–1.2)
Total Protein: 6.7 g/dL (ref 6.0–8.3)

## 2021-05-19 LAB — TSH: TSH: 2.02 u[IU]/mL (ref 0.35–5.50)

## 2021-05-19 LAB — URINALYSIS, ROUTINE W REFLEX MICROSCOPIC
Bilirubin Urine: NEGATIVE
Hgb urine dipstick: NEGATIVE
Ketones, ur: NEGATIVE
Leukocytes,Ua: NEGATIVE
Nitrite: NEGATIVE
Specific Gravity, Urine: 1.025 (ref 1.000–1.030)
Total Protein, Urine: 100 — AB
Urine Glucose: NEGATIVE
Urobilinogen, UA: 1 (ref 0.0–1.0)
pH: 6 (ref 5.0–8.0)

## 2021-05-19 LAB — T4, FREE: Free T4: 0.84 ng/dL (ref 0.60–1.60)

## 2021-05-19 LAB — CK: Total CK: 49 U/L (ref 7–232)

## 2021-05-19 LAB — T3, FREE: T3, Free: 3 pg/mL (ref 2.3–4.2)

## 2021-05-19 LAB — VITAMIN B12: Vitamin B-12: 205 pg/mL — ABNORMAL LOW (ref 211–911)

## 2021-05-19 MED ORDER — KETOCONAZOLE 2 % EX CREA: 1.0000 "application " | TOPICAL_CREAM | Freq: Two times a day (BID) | CUTANEOUS | 0 refills | Status: DC | PRN

## 2021-05-19 NOTE — Progress Notes (Addendum)
   Subjective:    Patient ID: Brendan Long, male    DOB: March 05, 1929, 87 y.o.   MRN: [id number]  HPI Here for one week of spells that begin with a sensation of "ants going up and down" his back. This is followed by diffuse body aches and some nausea. No vomiting. The spells last about 5 minutes and then go away. He has 2 or 3 of these spells every day. No measurable fevers. No headache or ST. No cough or SOB. No chest pain. His BMs are normal. No urinary symptoms. I asked about possible carbon monoxide exposure, and he says he has a monitor in his house that he checks frequently. He has been taking 80 mg of Lipitor and 48 mg of Tricor together for several years. He had been taking Eliquis for quite a while for a hx of atrial fibrillation, but a doctor at the [place] clinic told him to stop taking this 4 months ago. He also asks me to check a tender rash that appeared in the right groin 2 weeks ago.   Review of Systems  Constitutional:  Positive for chills. Negative for diaphoresis and fever.  HENT: Negative.    Eyes: Negative.   Respiratory: Negative.    Cardiovascular: Negative.   Gastrointestinal:  Positive for nausea. Negative for abdominal distention, abdominal pain, blood in stool, constipation, diarrhea and vomiting.  Genitourinary: Negative.   Musculoskeletal:  Positive for myalgias.  Skin:  Positive for rash.  Neurological: Negative.       Objective:   Physical Exam Skin:    Comments: Red macular rash in the right groin           Assessment & Plan:  He has had one week of episodes which involve chills and muscle aches. A possible etiology would be toxicity from taking maximum dose Lipitor and Tricor together. We will send him for labs today to include a CK level. He will drink plenty of fluids. We will treat the Candidiasis with Ketoconazole cream. Recheck as needed. We spent a total of ( 31  ) minutes reviewing records and discussing these issues.  Alysia Penna, MD

## 2021-05-19 NOTE — Addendum Note (Signed)
Addended by: Alysia Penna A on: 05/19/2021 12:29 PM   Modules accepted: Orders

## 2021-06-03 ENCOUNTER — Other Ambulatory Visit: Payer: Self-pay

## 2021-06-03 DIAGNOSIS — E538 Deficiency of other specified B group vitamins: Secondary | ICD-10-CM

## 2021-06-06 ENCOUNTER — Other Ambulatory Visit (INDEPENDENT_AMBULATORY_CARE_PROVIDER_SITE_OTHER): Payer: Medicare Other

## 2021-06-06 ENCOUNTER — Other Ambulatory Visit: Payer: Self-pay

## 2021-06-06 DIAGNOSIS — E538 Deficiency of other specified B group vitamins: Secondary | ICD-10-CM

## 2021-06-07 LAB — VITAMIN B12: Vitamin B-12: 414 pg/mL (ref 232–1245)

## 2021-07-14 ENCOUNTER — Encounter: Payer: Self-pay | Admitting: Internal Medicine

## 2021-08-23 ENCOUNTER — Other Ambulatory Visit: Payer: Self-pay

## 2021-08-23 ENCOUNTER — Emergency Department (HOSPITAL_COMMUNITY): Payer: Medicare Other

## 2021-08-23 ENCOUNTER — Inpatient Hospital Stay (HOSPITAL_COMMUNITY)
Admission: EM | Admit: 2021-08-23 | Discharge: 2021-08-31 | DRG: 087 | Disposition: A | Discharge status: Skilled Nursing Facility | Payer: Medicare Other | Attending: Neurosurgery | Admitting: Neurosurgery

## 2021-08-23 ENCOUNTER — Encounter (HOSPITAL_COMMUNITY): Payer: Self-pay

## 2021-08-23 DIAGNOSIS — M1990 Unspecified osteoarthritis, unspecified site: Secondary | ICD-10-CM | POA: Diagnosis not present

## 2021-08-23 DIAGNOSIS — Z87891 Personal history of nicotine dependence: Secondary | ICD-10-CM

## 2021-08-23 DIAGNOSIS — Z7951 Long term (current) use of inhaled steroids: Secondary | ICD-10-CM

## 2021-08-23 DIAGNOSIS — N529 Male erectile dysfunction, unspecified: Secondary | ICD-10-CM | POA: Diagnosis not present

## 2021-08-23 DIAGNOSIS — S065XAA Traumatic subdural hemorrhage with loss of consciousness status unknown, initial encounter: Secondary | ICD-10-CM | POA: Diagnosis not present

## 2021-08-23 DIAGNOSIS — Z96641 Presence of right artificial hip joint: Secondary | ICD-10-CM | POA: Diagnosis present

## 2021-08-23 DIAGNOSIS — E785 Hyperlipidemia, unspecified: Secondary | ICD-10-CM | POA: Diagnosis present

## 2021-08-23 DIAGNOSIS — W1830XA Fall on same level, unspecified, initial encounter: Secondary | ICD-10-CM | POA: Diagnosis present

## 2021-08-23 DIAGNOSIS — R3912 Poor urinary stream: Secondary | ICD-10-CM | POA: Diagnosis not present

## 2021-08-23 DIAGNOSIS — R319 Hematuria, unspecified: Secondary | ICD-10-CM | POA: Diagnosis present

## 2021-08-23 DIAGNOSIS — R3915 Urgency of urination: Secondary | ICD-10-CM | POA: Diagnosis present

## 2021-08-23 DIAGNOSIS — M542 Cervicalgia: Secondary | ICD-10-CM | POA: Diagnosis not present

## 2021-08-23 DIAGNOSIS — Z466 Encounter for fitting and adjustment of urinary device: Secondary | ICD-10-CM | POA: Diagnosis not present

## 2021-08-23 DIAGNOSIS — R55 Syncope and collapse: Secondary | ICD-10-CM | POA: Diagnosis not present

## 2021-08-23 DIAGNOSIS — R2681 Unsteadiness on feet: Secondary | ICD-10-CM | POA: Diagnosis not present

## 2021-08-23 DIAGNOSIS — I4510 Unspecified right bundle-branch block: Secondary | ICD-10-CM | POA: Diagnosis present

## 2021-08-23 DIAGNOSIS — Z9181 History of falling: Secondary | ICD-10-CM | POA: Diagnosis not present

## 2021-08-23 DIAGNOSIS — R9431 Abnormal electrocardiogram [ECG] [EKG]: Secondary | ICD-10-CM | POA: Diagnosis not present

## 2021-08-23 DIAGNOSIS — M25552 Pain in left hip: Secondary | ICD-10-CM | POA: Diagnosis not present

## 2021-08-23 DIAGNOSIS — R339 Retention of urine, unspecified: Secondary | ICD-10-CM | POA: Diagnosis not present

## 2021-08-23 DIAGNOSIS — Z7401 Bed confinement status: Secondary | ICD-10-CM | POA: Diagnosis not present

## 2021-08-23 DIAGNOSIS — Z7901 Long term (current) use of anticoagulants: Secondary | ICD-10-CM

## 2021-08-23 DIAGNOSIS — Z20822 Contact with and (suspected) exposure to covid-19: Secondary | ICD-10-CM | POA: Diagnosis present

## 2021-08-23 DIAGNOSIS — Z8673 Personal history of transient ischemic attack (TIA), and cerebral infarction without residual deficits: Secondary | ICD-10-CM | POA: Diagnosis not present

## 2021-08-23 DIAGNOSIS — I4891 Unspecified atrial fibrillation: Secondary | ICD-10-CM | POA: Diagnosis present

## 2021-08-23 DIAGNOSIS — R21 Rash and other nonspecific skin eruption: Secondary | ICD-10-CM | POA: Diagnosis not present

## 2021-08-23 DIAGNOSIS — M25569 Pain in unspecified knee: Secondary | ICD-10-CM | POA: Diagnosis not present

## 2021-08-23 DIAGNOSIS — Z79899 Other long term (current) drug therapy: Secondary | ICD-10-CM

## 2021-08-23 DIAGNOSIS — E1122 Type 2 diabetes mellitus with diabetic chronic kidney disease: Secondary | ICD-10-CM | POA: Diagnosis not present

## 2021-08-23 DIAGNOSIS — R569 Unspecified convulsions: Secondary | ICD-10-CM | POA: Diagnosis not present

## 2021-08-23 DIAGNOSIS — R350 Frequency of micturition: Secondary | ICD-10-CM | POA: Diagnosis not present

## 2021-08-23 DIAGNOSIS — R351 Nocturia: Secondary | ICD-10-CM | POA: Diagnosis present

## 2021-08-23 DIAGNOSIS — E039 Hypothyroidism, unspecified: Secondary | ICD-10-CM | POA: Diagnosis not present

## 2021-08-23 DIAGNOSIS — Z741 Need for assistance with personal care: Secondary | ICD-10-CM | POA: Diagnosis not present

## 2021-08-23 DIAGNOSIS — S065XAD Traumatic subdural hemorrhage with loss of consciousness status unknown, subsequent encounter: Secondary | ICD-10-CM | POA: Diagnosis not present

## 2021-08-23 DIAGNOSIS — I700 Atherosclerosis of aorta: Secondary | ICD-10-CM | POA: Diagnosis not present

## 2021-08-23 DIAGNOSIS — N401 Enlarged prostate with lower urinary tract symptoms: Secondary | ICD-10-CM | POA: Diagnosis present

## 2021-08-23 DIAGNOSIS — R079 Chest pain, unspecified: Secondary | ICD-10-CM | POA: Diagnosis not present

## 2021-08-23 DIAGNOSIS — I480 Paroxysmal atrial fibrillation: Secondary | ICD-10-CM | POA: Diagnosis not present

## 2021-08-23 DIAGNOSIS — R58 Hemorrhage, not elsewhere classified: Secondary | ICD-10-CM | POA: Diagnosis not present

## 2021-08-23 DIAGNOSIS — R1310 Dysphagia, unspecified: Secondary | ICD-10-CM | POA: Diagnosis not present

## 2021-08-23 DIAGNOSIS — R404 Transient alteration of awareness: Secondary | ICD-10-CM | POA: Diagnosis not present

## 2021-08-23 DIAGNOSIS — N289 Disorder of kidney and ureter, unspecified: Secondary | ICD-10-CM | POA: Diagnosis not present

## 2021-08-23 DIAGNOSIS — I6381 Other cerebral infarction due to occlusion or stenosis of small artery: Secondary | ICD-10-CM | POA: Diagnosis not present

## 2021-08-23 DIAGNOSIS — I6200 Nontraumatic subdural hemorrhage, unspecified: Secondary | ICD-10-CM | POA: Diagnosis not present

## 2021-08-23 DIAGNOSIS — N183 Chronic kidney disease, stage 3 unspecified: Secondary | ICD-10-CM | POA: Diagnosis not present

## 2021-08-23 DIAGNOSIS — S065X0A Traumatic subdural hemorrhage without loss of consciousness, initial encounter: Secondary | ICD-10-CM | POA: Diagnosis not present

## 2021-08-23 DIAGNOSIS — R338 Other retention of urine: Secondary | ICD-10-CM | POA: Diagnosis not present

## 2021-08-23 DIAGNOSIS — I621 Nontraumatic extradural hemorrhage: Secondary | ICD-10-CM | POA: Diagnosis not present

## 2021-08-23 DIAGNOSIS — I959 Hypotension, unspecified: Secondary | ICD-10-CM | POA: Diagnosis not present

## 2021-08-23 DIAGNOSIS — I129 Hypertensive chronic kidney disease with stage 1 through stage 4 chronic kidney disease, or unspecified chronic kidney disease: Secondary | ICD-10-CM | POA: Diagnosis present

## 2021-08-23 DIAGNOSIS — J3489 Other specified disorders of nose and nasal sinuses: Secondary | ICD-10-CM | POA: Diagnosis not present

## 2021-08-23 DIAGNOSIS — L409 Psoriasis, unspecified: Secondary | ICD-10-CM | POA: Diagnosis not present

## 2021-08-23 DIAGNOSIS — M6281 Muscle weakness (generalized): Secondary | ICD-10-CM | POA: Diagnosis not present

## 2021-08-23 DIAGNOSIS — R29818 Other symptoms and signs involving the nervous system: Secondary | ICD-10-CM | POA: Diagnosis not present

## 2021-08-23 DIAGNOSIS — Y929 Unspecified place or not applicable: Secondary | ICD-10-CM

## 2021-08-23 DIAGNOSIS — E559 Vitamin D deficiency, unspecified: Secondary | ICD-10-CM | POA: Diagnosis not present

## 2021-08-23 DIAGNOSIS — F918 Other conduct disorders: Secondary | ICD-10-CM | POA: Diagnosis not present

## 2021-08-23 DIAGNOSIS — N189 Chronic kidney disease, unspecified: Secondary | ICD-10-CM | POA: Diagnosis present

## 2021-08-23 DIAGNOSIS — R109 Unspecified abdominal pain: Secondary | ICD-10-CM | POA: Diagnosis not present

## 2021-08-23 DIAGNOSIS — R519 Headache, unspecified: Secondary | ICD-10-CM | POA: Diagnosis not present

## 2021-08-23 DIAGNOSIS — N365 Urethral false passage: Secondary | ICD-10-CM | POA: Diagnosis not present

## 2021-08-23 DIAGNOSIS — R41841 Cognitive communication deficit: Secondary | ICD-10-CM | POA: Diagnosis not present

## 2021-08-23 HISTORY — DX: Cerebral infarction, unspecified: I63.9

## 2021-08-23 LAB — CBC WITH DIFFERENTIAL/PLATELET
Abs Immature Granulocytes: 0.03 10*3/uL (ref 0.00–0.07)
Basophils Absolute: 0.1 10*3/uL (ref 0.0–0.1)
Basophils Relative: 1 %
Eosinophils Absolute: 0.5 10*3/uL (ref 0.0–0.5)
Eosinophils Relative: 5 %
HCT: 46.2 % (ref 39.0–52.0)
Hemoglobin: 15.2 g/dL (ref 13.0–17.0)
Immature Granulocytes: 0 %
Lymphocytes Relative: 21 %
Lymphs Abs: 2.3 10*3/uL (ref 0.7–4.0)
MCH: 30.8 pg (ref 26.0–34.0)
MCHC: 32.9 g/dL (ref 30.0–36.0)
MCV: 93.7 fL (ref 80.0–100.0)
Monocytes Absolute: 0.6 10*3/uL (ref 0.1–1.0)
Monocytes Relative: 6 %
Neutro Abs: 7.4 10*3/uL (ref 1.7–7.7)
Neutrophils Relative %: 67 %
Platelets: 196 10*3/uL (ref 150–400)
RBC: 4.93 MIL/uL (ref 4.22–5.81)
RDW: 12.9 % (ref 11.5–15.5)
WBC: 10.8 10*3/uL — ABNORMAL HIGH (ref 4.0–10.5)
nRBC: 0 % (ref 0.0–0.2)

## 2021-08-23 LAB — PROTIME-INR
INR: 1 (ref 0.8–1.2)
Prothrombin Time: 13.2 seconds (ref 11.4–15.2)

## 2021-08-23 LAB — COMPREHENSIVE METABOLIC PANEL
ALT: 15 U/L (ref 0–44)
AST: 23 U/L (ref 15–41)
Albumin: 3.9 g/dL (ref 3.5–5.0)
Alkaline Phosphatase: 69 U/L (ref 38–126)
Anion gap: 6 (ref 5–15)
BUN: 30 mg/dL — ABNORMAL HIGH (ref 8–23)
CO2: 25 mmol/L (ref 22–32)
Calcium: 9 mg/dL (ref 8.9–10.3)
Chloride: 103 mmol/L (ref 98–111)
Creatinine, Ser: 1.8 mg/dL — ABNORMAL HIGH (ref 0.61–1.24)
GFR, Estimated: 35 mL/min — ABNORMAL LOW (ref >60)
Glucose, Bld: 87 mg/dL (ref 70–99)
Potassium: 3.7 mmol/L (ref 3.5–5.1)
Sodium: 134 mmol/L — ABNORMAL LOW (ref 135–145)
Total Bilirubin: 2.2 mg/dL — ABNORMAL HIGH (ref 0.3–1.2)
Total Protein: 6.9 g/dL (ref 6.5–8.1)

## 2021-08-23 LAB — RESP PANEL BY RT-PCR (FLU A&B, COVID) ARPGX2
Influenza A by PCR: NEGATIVE
Influenza B by PCR: NEGATIVE
SARS Coronavirus 2 by RT PCR: NEGATIVE

## 2021-08-23 LAB — URINALYSIS, ROUTINE W REFLEX MICROSCOPIC
Bacteria, UA: NONE SEEN
Bilirubin Urine: NEGATIVE
Glucose, UA: NEGATIVE mg/dL
Ketones, ur: 5 mg/dL — AB
Nitrite: NEGATIVE
Protein, ur: 100 mg/dL — AB
RBC / HPF: >50 RBC/hpf — ABNORMAL HIGH (ref 0–5)
Specific Gravity, Urine: 1.018 (ref 1.005–1.030)
pH: 5 (ref 5.0–8.0)

## 2021-08-23 LAB — MRSA NEXT GEN BY PCR, NASAL: MRSA by PCR Next Gen: NOT DETECTED

## 2021-08-23 IMAGING — CT CT HEAD W/O CM
4 series · 15 of 47 positions shown, 17 images · non-contrast
Comparison: None.

CLINICAL DATA: Neck pain, left hip pain status post fall this
morning.



[Series 2: head wo · axial · 0.47mm/px · z∈[+1359,+1474]mm · 7 of 31 slices shown, 9 images]
[im 4/31  brain]
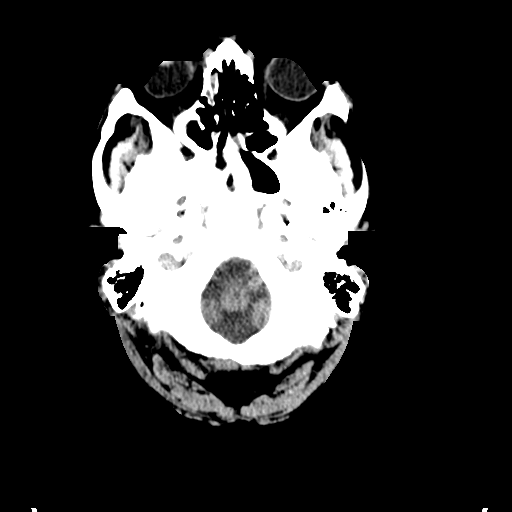
[im 4/31  bone]
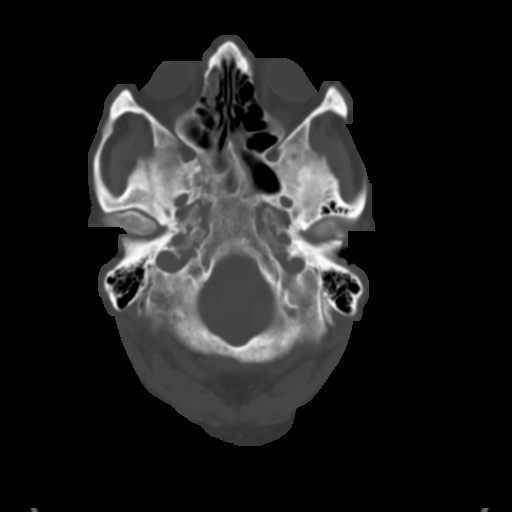
[im 8/31  brain]
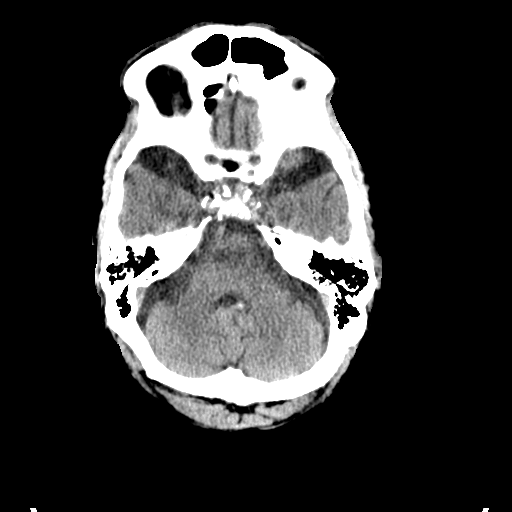
[im 12/31  brain]
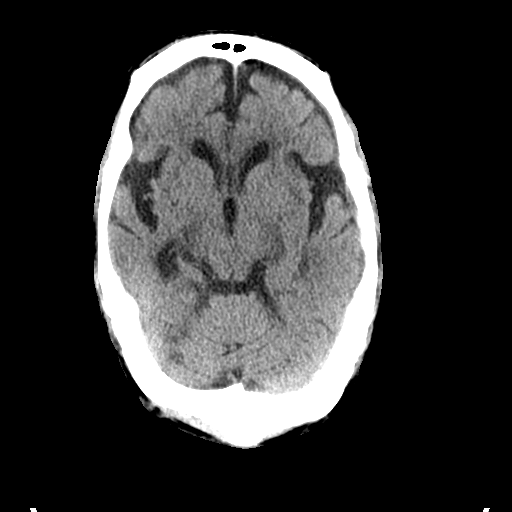
[im 16/31  brain]
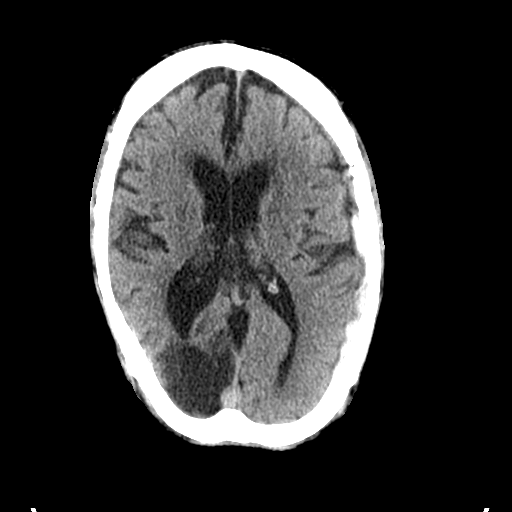
[im 19/31  brain]
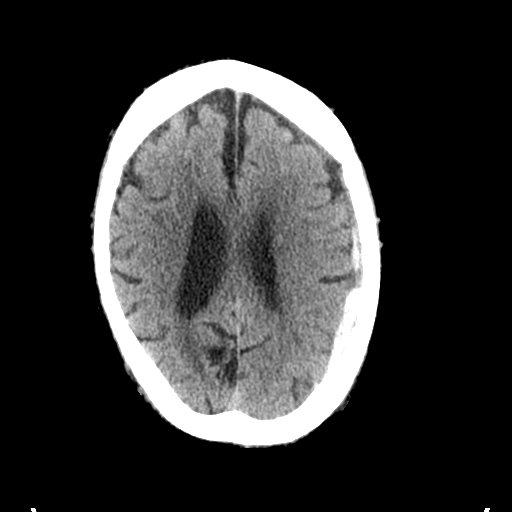
[im 19/31  bone]
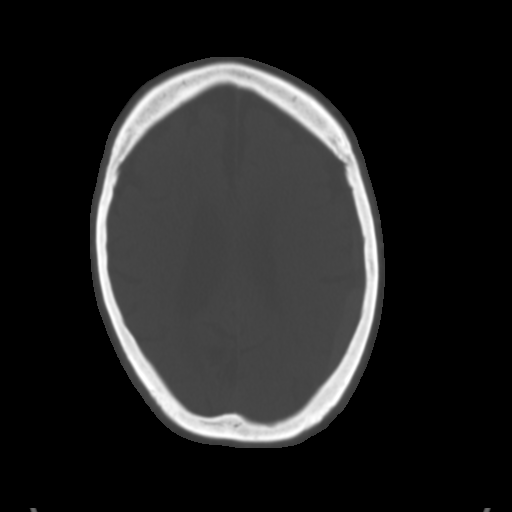
[im 23/31  brain]
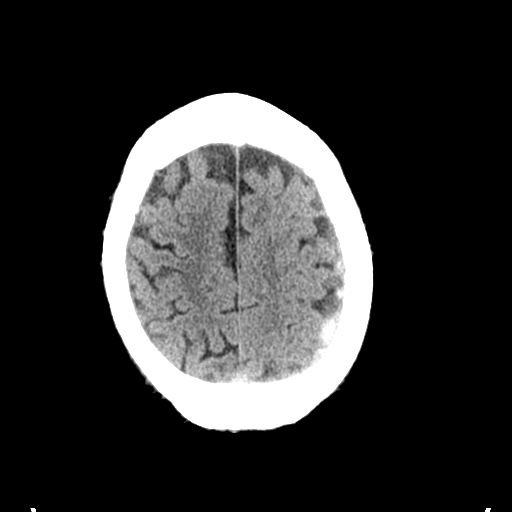
[im 27/31  brain]
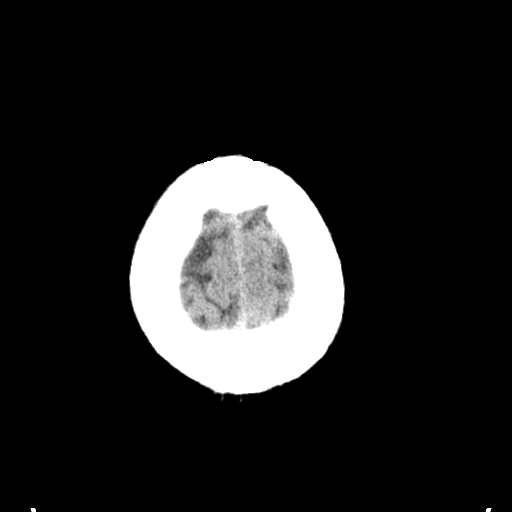

[Series 3: head bone · axial · 0.47mm/px · z∈[+1358,+1374]mm · 2 of 77 slices shown]
[im 8/77  bone]
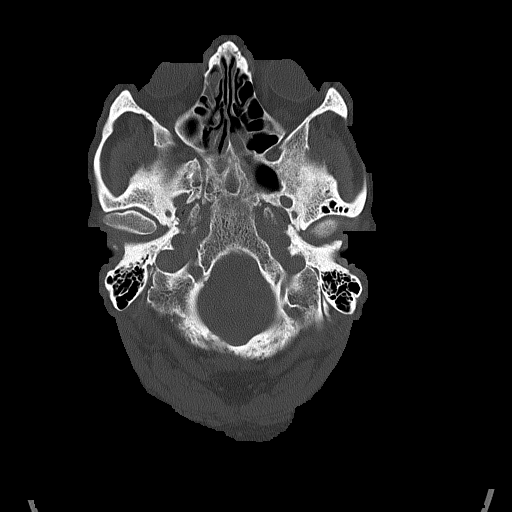
[im 16/77  bone]
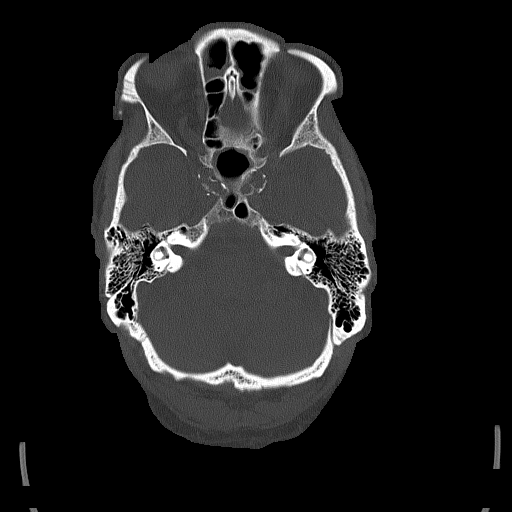

[Series 5: coronal soft tissue · coronal · 0.37mm/px · 3 of 69 slices shown]
[im 23/69  brain]
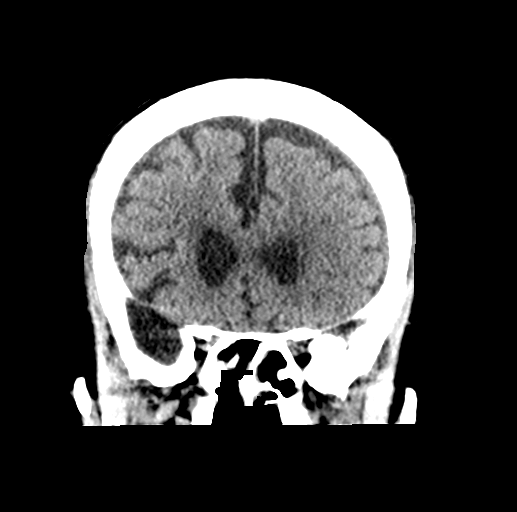
[im 31/69  brain]
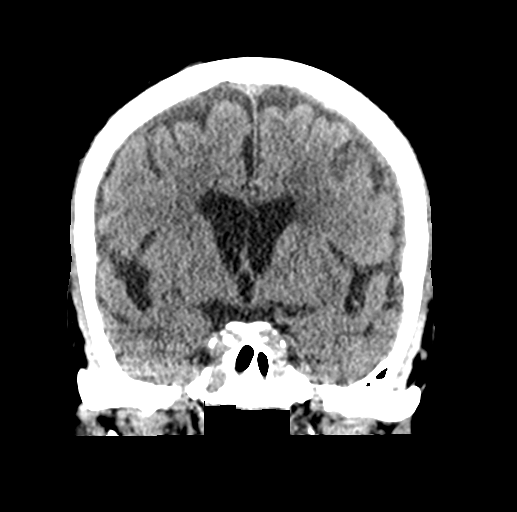
[im 38/69  brain]
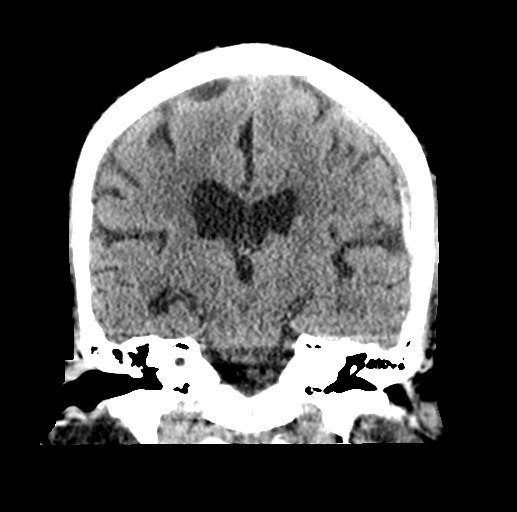

[Series 6: sagittal soft tissue · sagittal · 0.37mm/px · 3 of 49 slices shown]
[im 17/49  brain]
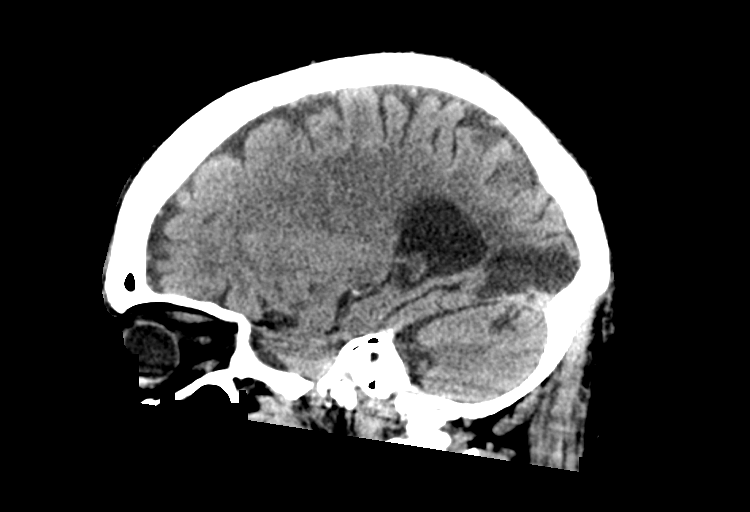
[im 25/49  brain]
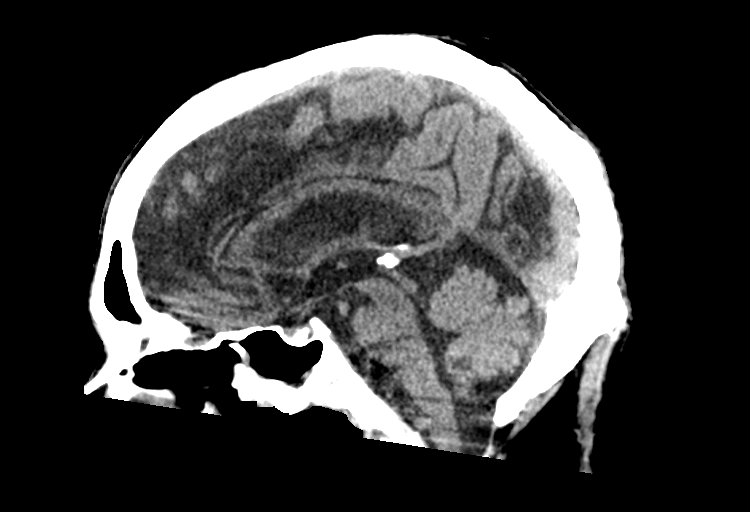
[im 33/49  brain]
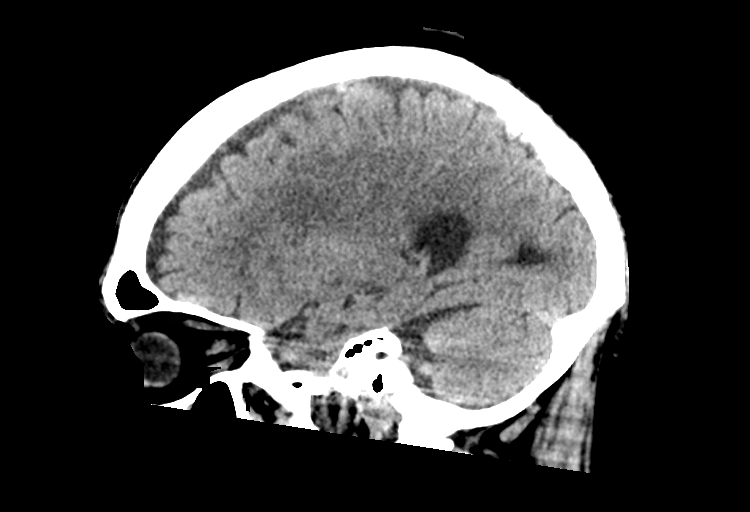

[15 of 47 positions shown; findings below may reference images not displayed]

FINDINGS: Brain: Acute hemorrhage along the left cerebral convexity along the
parietal lobe measuring 7 mm in thickness. 1-2 mm left-to-right
midline shift. No evidence of acute infarction, ventriculomegaly, or
mass effect. Old right parietal lobe infarct with encephalomalacia.
generalized cerebral atrophy. Periventricular white matter low
attenuation likely secondary to microangiopathy.

Vascular: Cerebrovascular atherosclerotic calcifications are noted.
No hyperdense vessels.

Skull: Negative for fracture or focal lesion.

Sinuses/Orbits: Visualized portions of the orbits are unremarkable.
Visualized portions of the paranasal sinuses are unremarkable.
Visualized portions of the mastoid air cells are unremarkable.

Other: None.

CT CERVICAL SPINE FINDINGS

Alignment: Normal.

Skull base and vertebrae: No acute fracture. No primary bone lesion
or focal pathologic process.

Soft tissues and spinal canal: No prevertebral fluid or swelling. No
visible canal hematoma.

Disc levels: Degenerative disease with disc height loss at C3-4,
C4-5, C5-6, C6-7, C7-T1 and T1-2 with mild bilateral facet
arthropathy. Bilateral uncovertebral degenerative changes at C3-4
and C4-5 with moderate-severe foraminal stenosis. Bilateral
uncovertebral degenerative changes at C5-6 with mild foraminal
stenosis.

Upper chest: Lung apices are clear.

Other: No fluid collection or hematoma.
IMPRESSION: 1. Acute subdural hemorrhage along the left parietal convexity
measuring 7 mm in thickness. 1-2 mm left right midline shift.
2.  No acute osseous injury of the cervical spine.

Critical Value/emergent results were called by telephone at the time
of interpretation on [DATE] at [DATE] to provider SAJ
SAJ , who verbally acknowledged these results.

## 2021-08-23 IMAGING — CT CT CERVICAL SPINE W/O CM
3 of 4 series · 12 of 33 positions shown, 14 images · non-contrast
Comparison: None.

CLINICAL DATA: Neck pain, left hip pain status post fall this
morning.



[Series 5: orthogonal bone · axial · 0.24mm/px · z∈[+1169,+1296]mm · 4 of 105 slices shown, 5 images]
[im 15/105  soft-tissue]
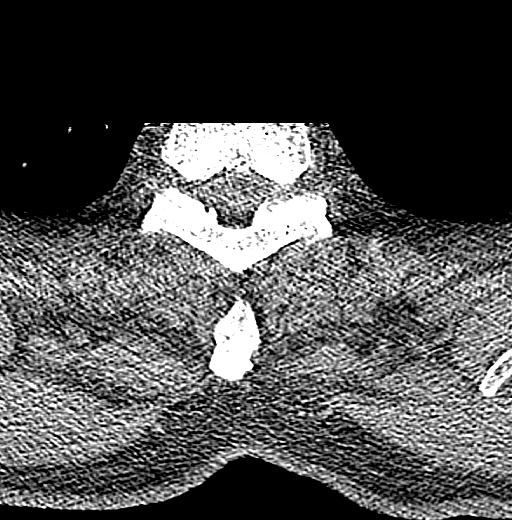
[im 15/105  bone]
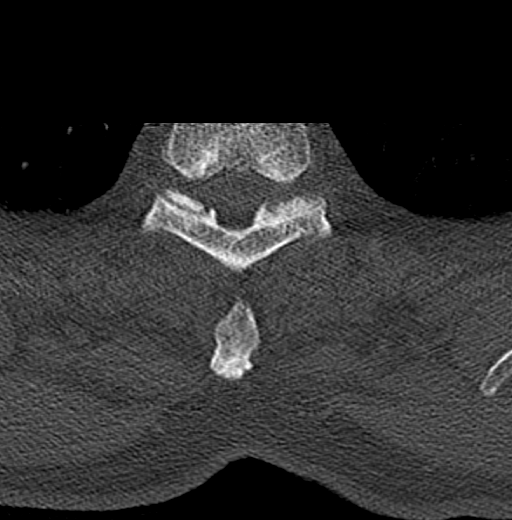
[im 45/105  bone]
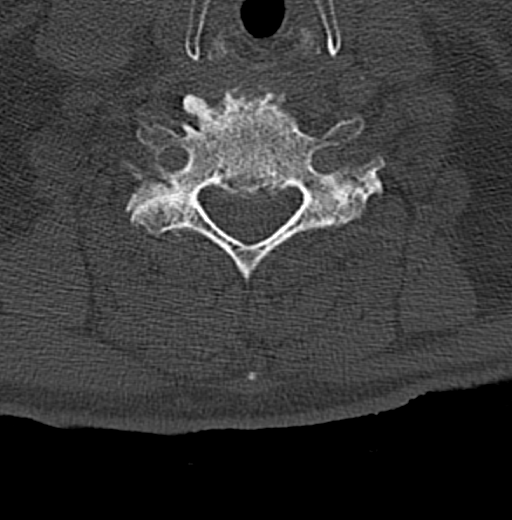
[im 60/105  bone]
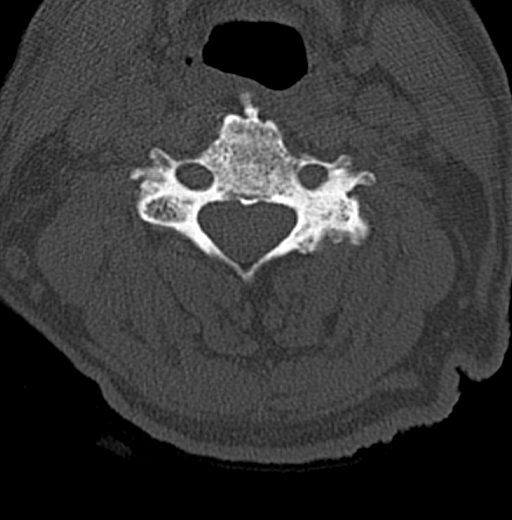
[im 90/105  bone]
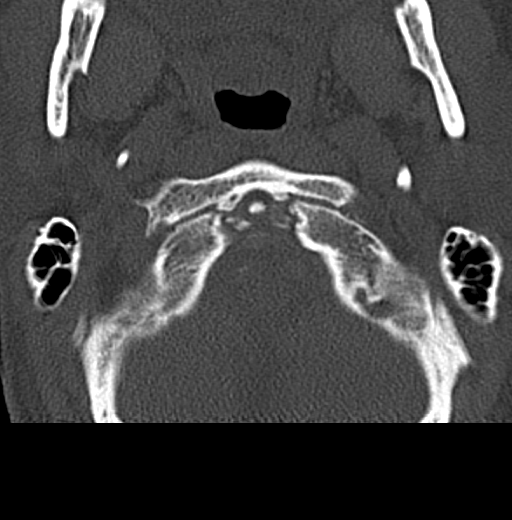

[Series 6: coronal bone · coronal · 0.27mm/px · 3 of 53 slices shown]
[im 11/53  bone]
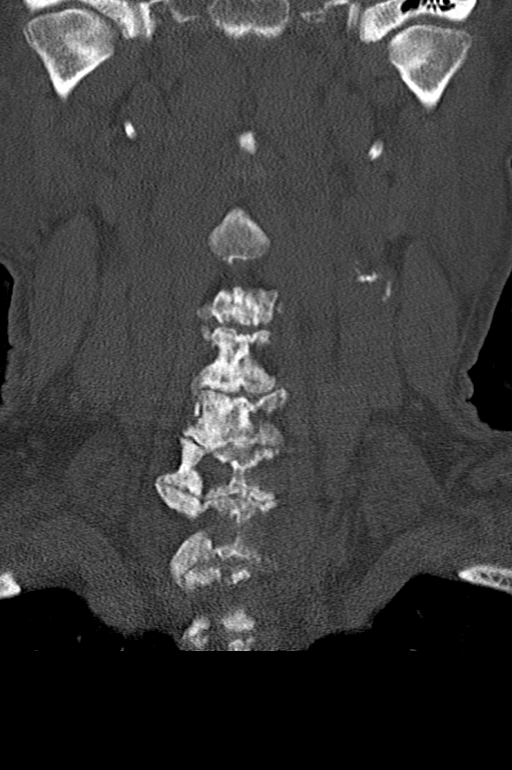
[im 21/53  bone]
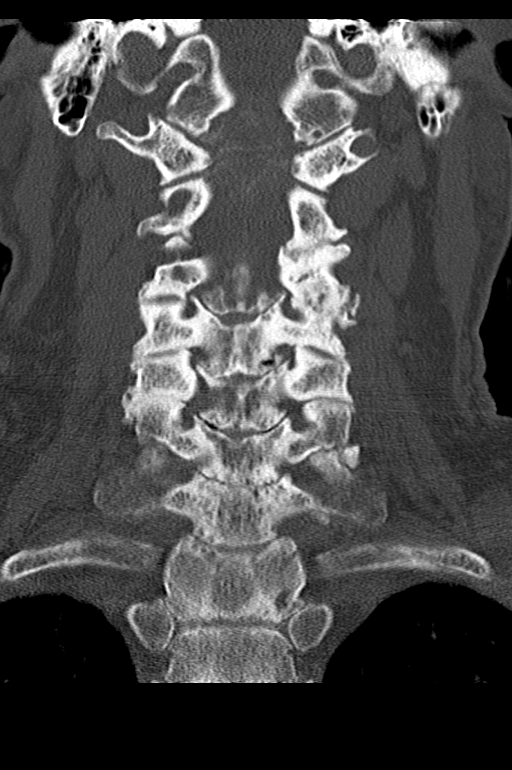
[im 32/53  bone]
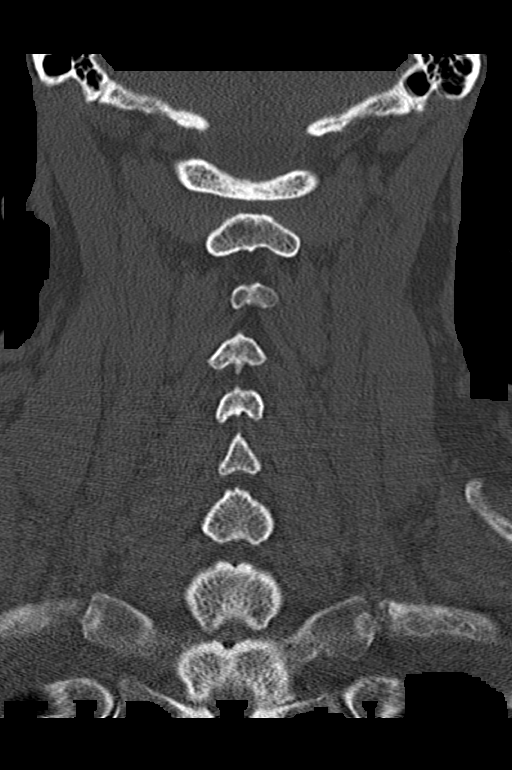

[Series 7: sagittal bone · sagittal · 0.29mm/px · 5 of 41 slices shown, 6 images]
[im 14/41  bone]
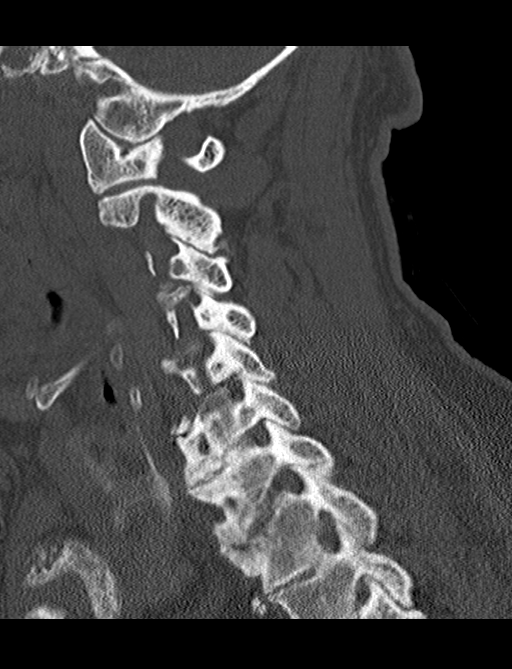
[im 17/41  bone]
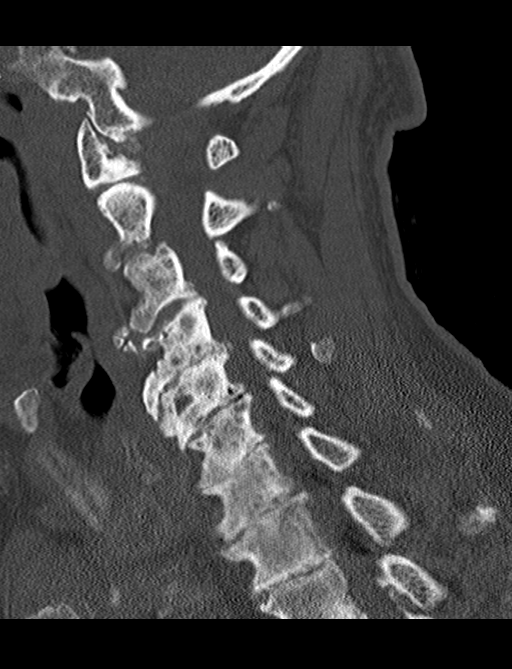
[im 21/41  soft-tissue]
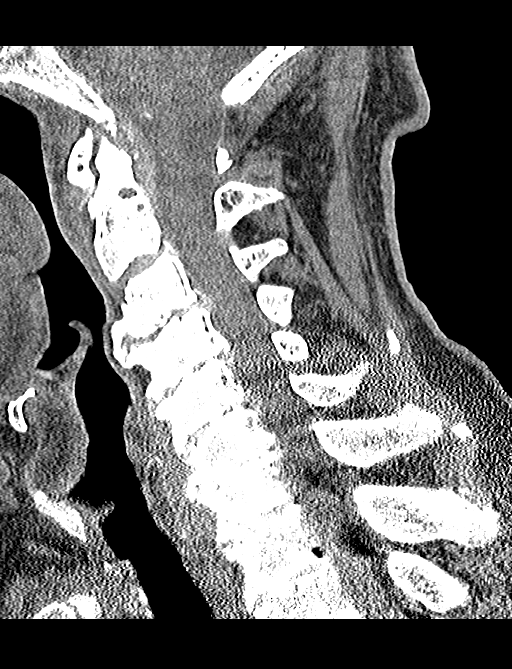
[im 21/41  bone]
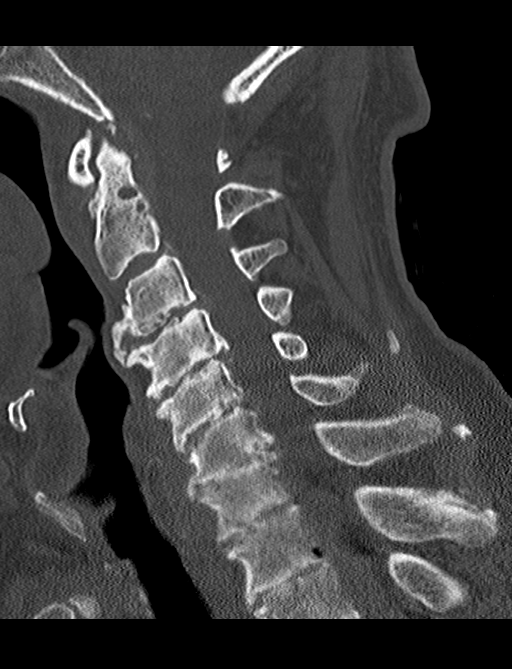
[im 24/41  bone]
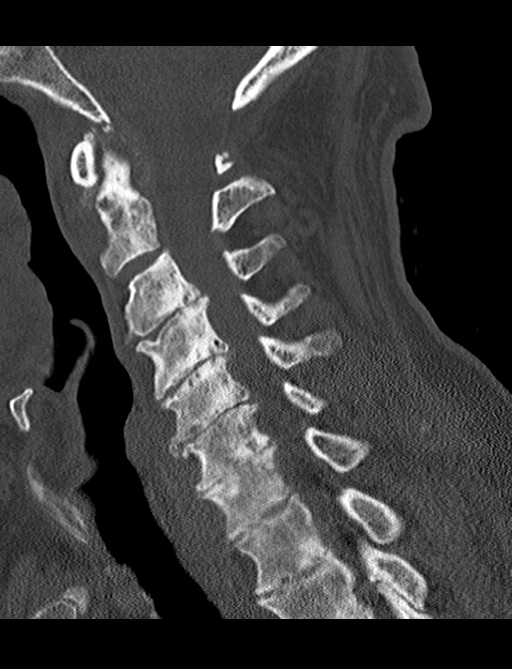
[im 27/41  bone]
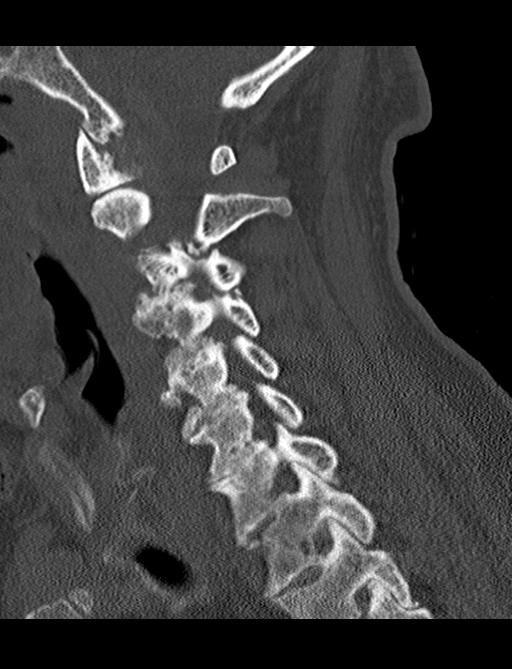

[12 of 33 positions shown; findings below may reference images not displayed]

FINDINGS: Brain: Acute hemorrhage along the left cerebral convexity along the
parietal lobe measuring 7 mm in thickness. 1-2 mm left-to-right
midline shift. No evidence of acute infarction, ventriculomegaly, or
mass effect. Old right parietal lobe infarct with encephalomalacia.
generalized cerebral atrophy. Periventricular white matter low
attenuation likely secondary to microangiopathy.

Vascular: Cerebrovascular atherosclerotic calcifications are noted.
No hyperdense vessels.

Skull: Negative for fracture or focal lesion.

Sinuses/Orbits: Visualized portions of the orbits are unremarkable.
Visualized portions of the paranasal sinuses are unremarkable.
Visualized portions of the mastoid air cells are unremarkable.

Other: None.

CT CERVICAL SPINE FINDINGS

Alignment: Normal.

Skull base and vertebrae: No acute fracture. No primary bone lesion
or focal pathologic process.

Soft tissues and spinal canal: No prevertebral fluid or swelling. No
visible canal hematoma.

Disc levels: Degenerative disease with disc height loss at C3-4,
C4-5, C5-6, C6-7, C7-T1 and T1-2 with mild bilateral facet
arthropathy. Bilateral uncovertebral degenerative changes at C3-4
and C4-5 with moderate-severe foraminal stenosis. Bilateral
uncovertebral degenerative changes at C5-6 with mild foraminal
stenosis.

Upper chest: Lung apices are clear.

Other: No fluid collection or hematoma.
IMPRESSION: 1. Acute subdural hemorrhage along the left parietal convexity
measuring 7 mm in thickness. 1-2 mm left right midline shift.
2.  No acute osseous injury of the cervical spine.

Critical Value/emergent results were called by telephone at the time
of interpretation on [DATE] at [DATE] to provider SAJ
SAJ , who verbally acknowledged these results.

## 2021-08-23 IMAGING — CT CT HIP*L* W/O CM
2 of 4 series · 16 of 46 positions shown, 18 images · non-contrast
Comparison: Pelvis and left hip radiographs [DATE]

CLINICAL DATA: Hip trauma.  Fracture suspected.

EXAM:
CT OF THE LEFT HIP WITHOUT CONTRAST
TECHNIQUE: Multidetector CT imaging of the left hip was performed according to
the standard protocol. Multiplanar CT image reconstructions were
also generated.
RADIATION DOSE REDUCTION: This exam was performed according to the
departmental dose-optimization program which includes automated
exposure control, adjustment of the mA and/or kV according to
patient size and/or use of iterative reconstruction technique.

[Series 3: axial (person_name) · axial · 0.38mm/px · z∈[-333,-67]mm · 13 of 153 slices shown, 15 images]
[im 10/153  soft-tissue]
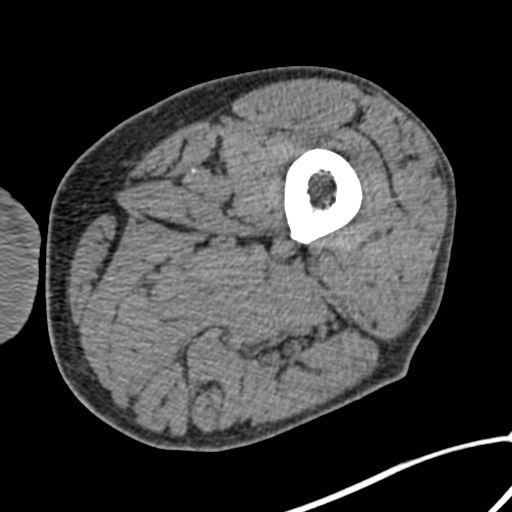
[im 10/153  bone]
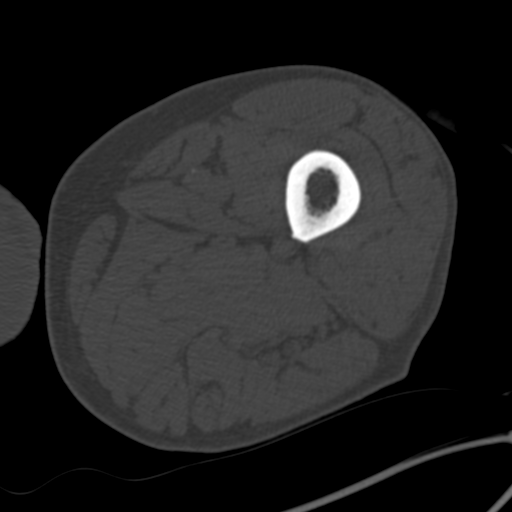
[im 20/153  soft-tissue]
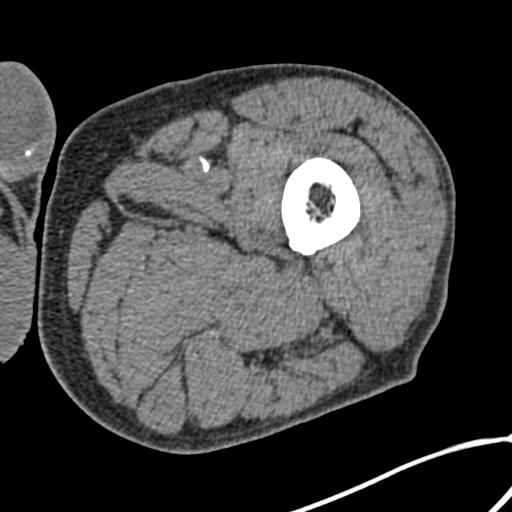
[im 29/153  soft-tissue]
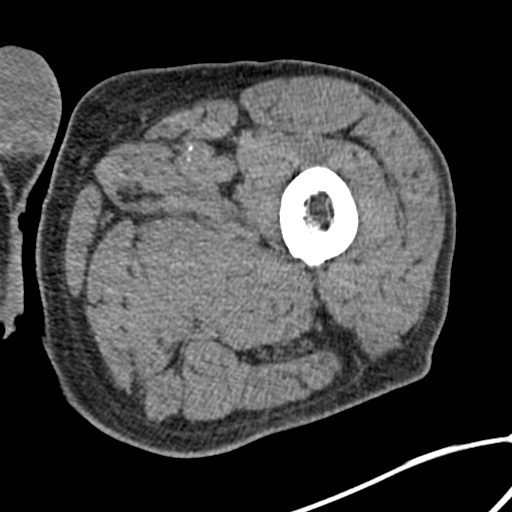
[im 48/153  soft-tissue]
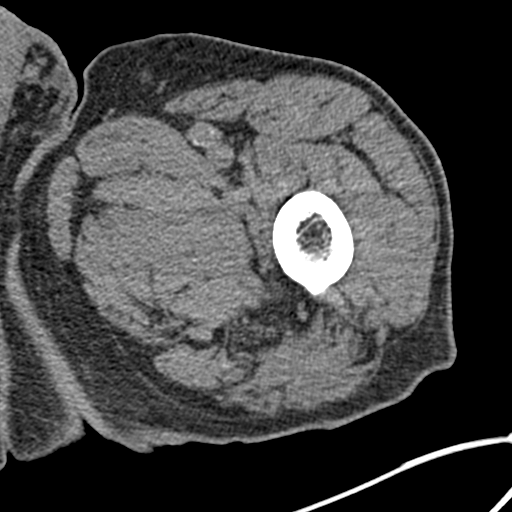
[im 58/153  soft-tissue]
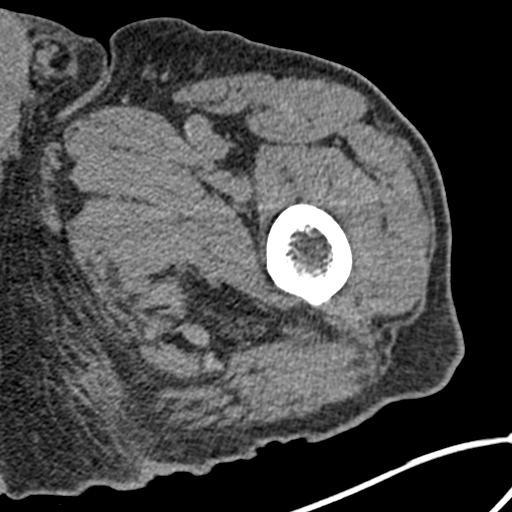
[im 67/153  soft-tissue]
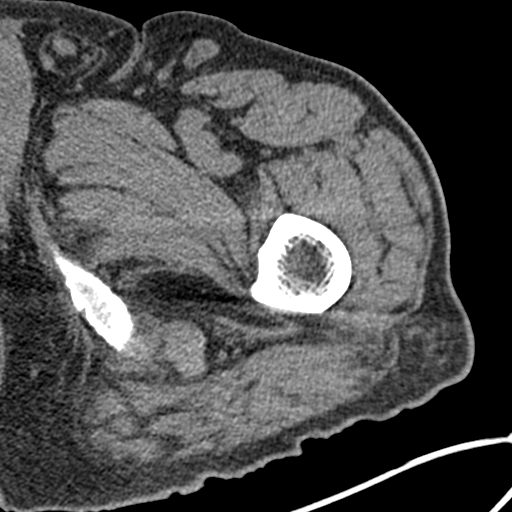
[im 77/153  soft-tissue]
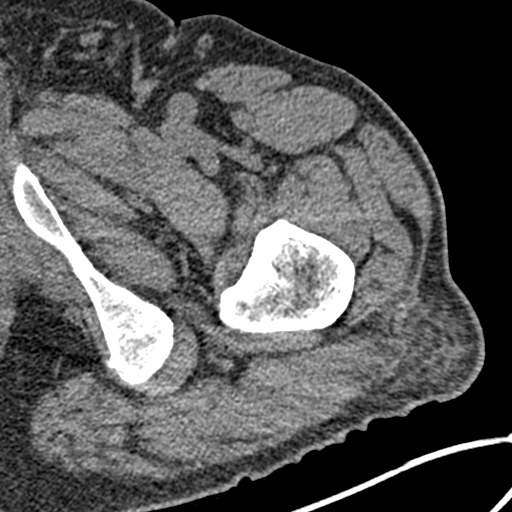
[im 86/153  soft-tissue]
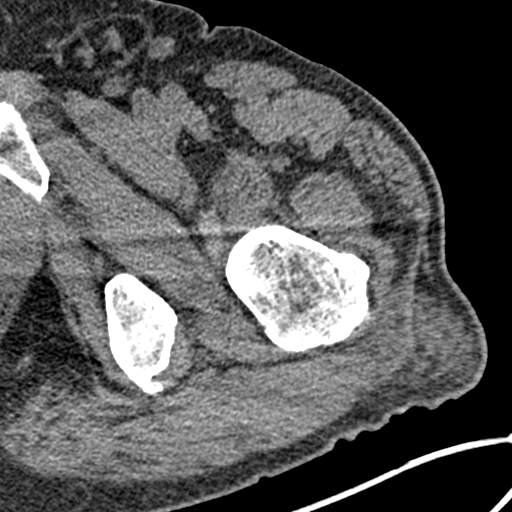
[im 96/153  soft-tissue]
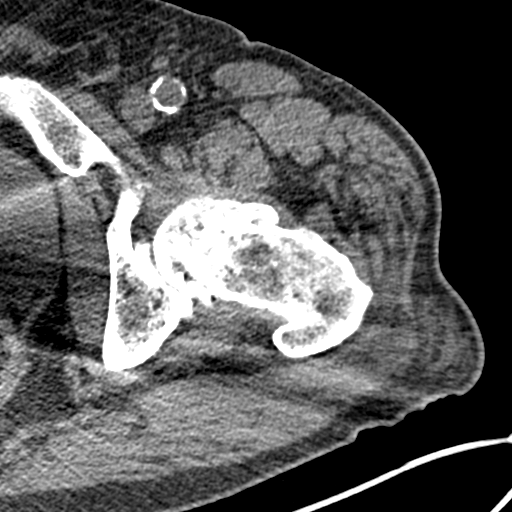
[im 96/153  bone]
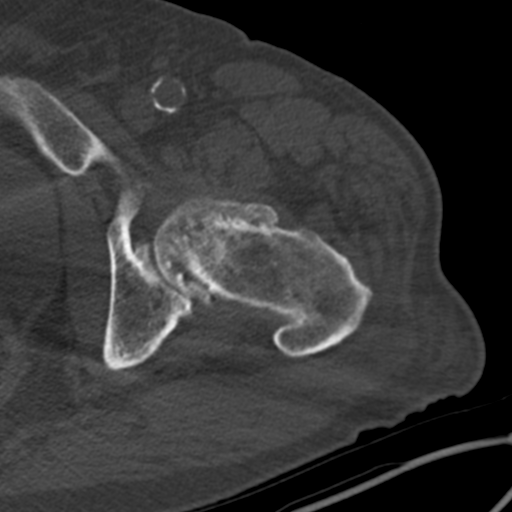
[im 105/153  soft-tissue]
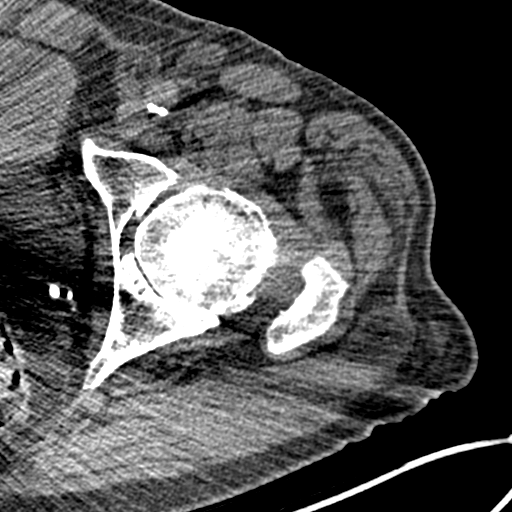
[im 124/153  soft-tissue]
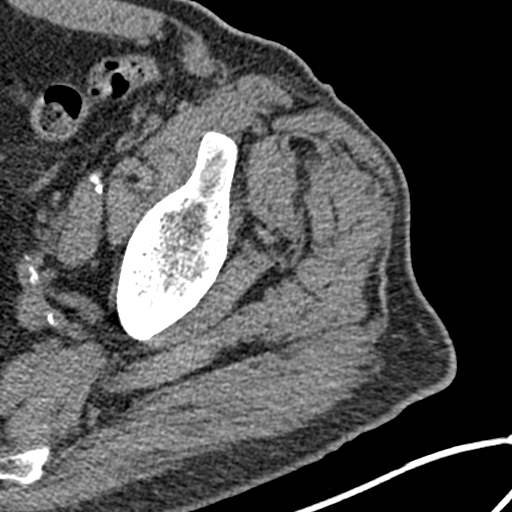
[im 134/153  soft-tissue]
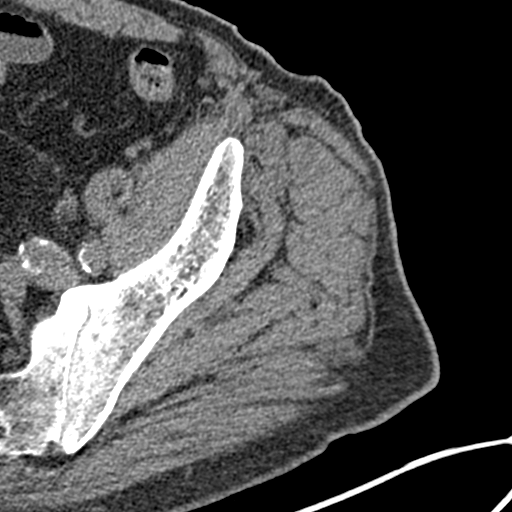
[im 143/153  soft-tissue]
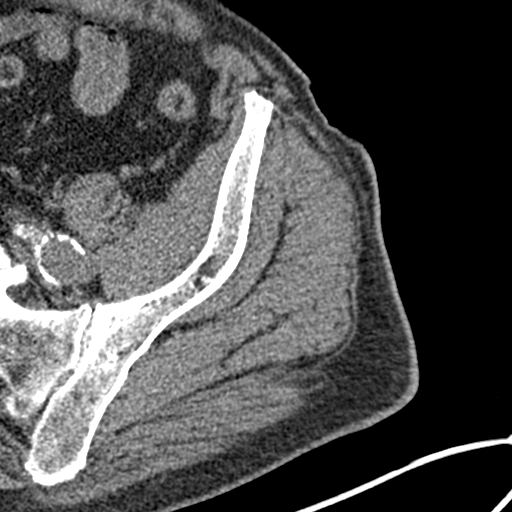

[Series 8: coronal st · coronal · 0.38mm/px · 3 of 106 slices shown]
[im 36/106  soft-tissue]
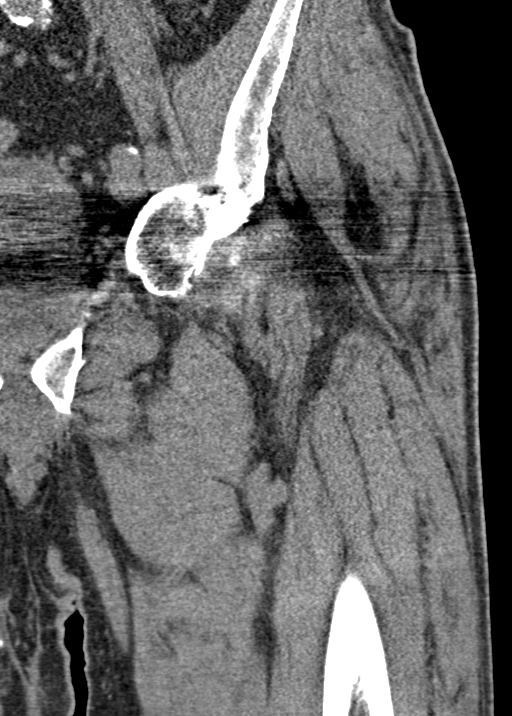
[im 47/106  soft-tissue]
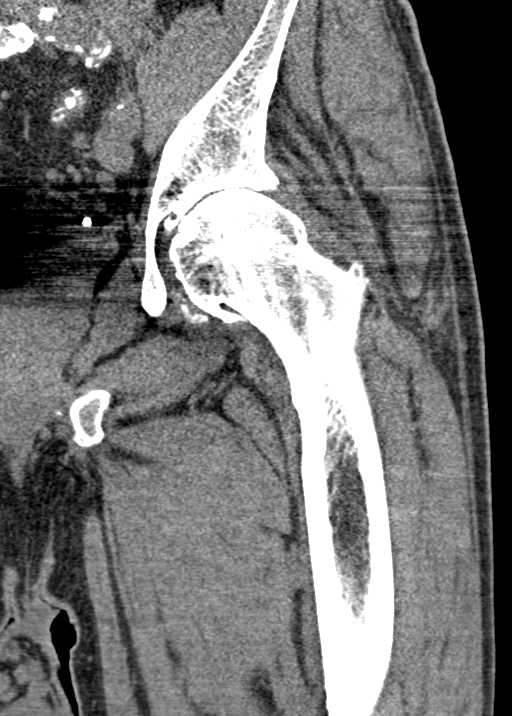
[im 59/106  soft-tissue]
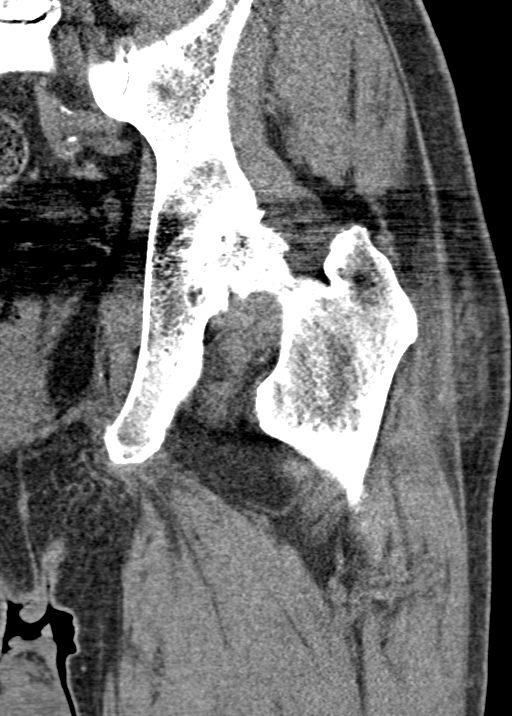

[16 of 46 positions shown; findings below may reference images not displayed]

FINDINGS: Bones/Joint/Cartilage

Severe left femoroacetabular osteoarthritis, greatest within the
posterosuperior aspect. Moderate subchondral sclerosis and
subchondral degenerative cystic change. Moderate to high-grade left
femoral head-neck junction and moderate peripheral left acetabular
degenerative osteophytosis.

Bridging degenerative osteophytes within the visualized portion of
the inferior left sacroiliac joint. More mild endplate osteophytes
of the right sacroiliac joint.

Streak artifact from total right hip arthroplasty hardware. No
perihardware lucency is seen to indicate hardware failure or
loosening.

No acute fracture is seen.

Ligaments

Suboptimally assessed by CT.

Muscles and Tendons

Grossly unremarkable.

Soft tissues

No left hip joint effusion. Moderate vascular calcifications are
noted.

Mild-to-moderate fat containing left inguinal hernia.
IMPRESSION: :
IMPRESSION: 1. No left hip fracture is seen.
2. Severe left femoroacetabular osteoarthritis.
3. Status post total right hip arthroplasty without evidence of
hardware failure.

## 2021-08-23 IMAGING — DX DG CHEST 1V PORT
1 series · 1 of 1 positions shown · non-contrast
Comparison: [DATE]

CLINICAL DATA: Neck and left hip pain, hematuria, fell

EXAM:
PORTABLE CHEST 1 VIEW

[chest ap]
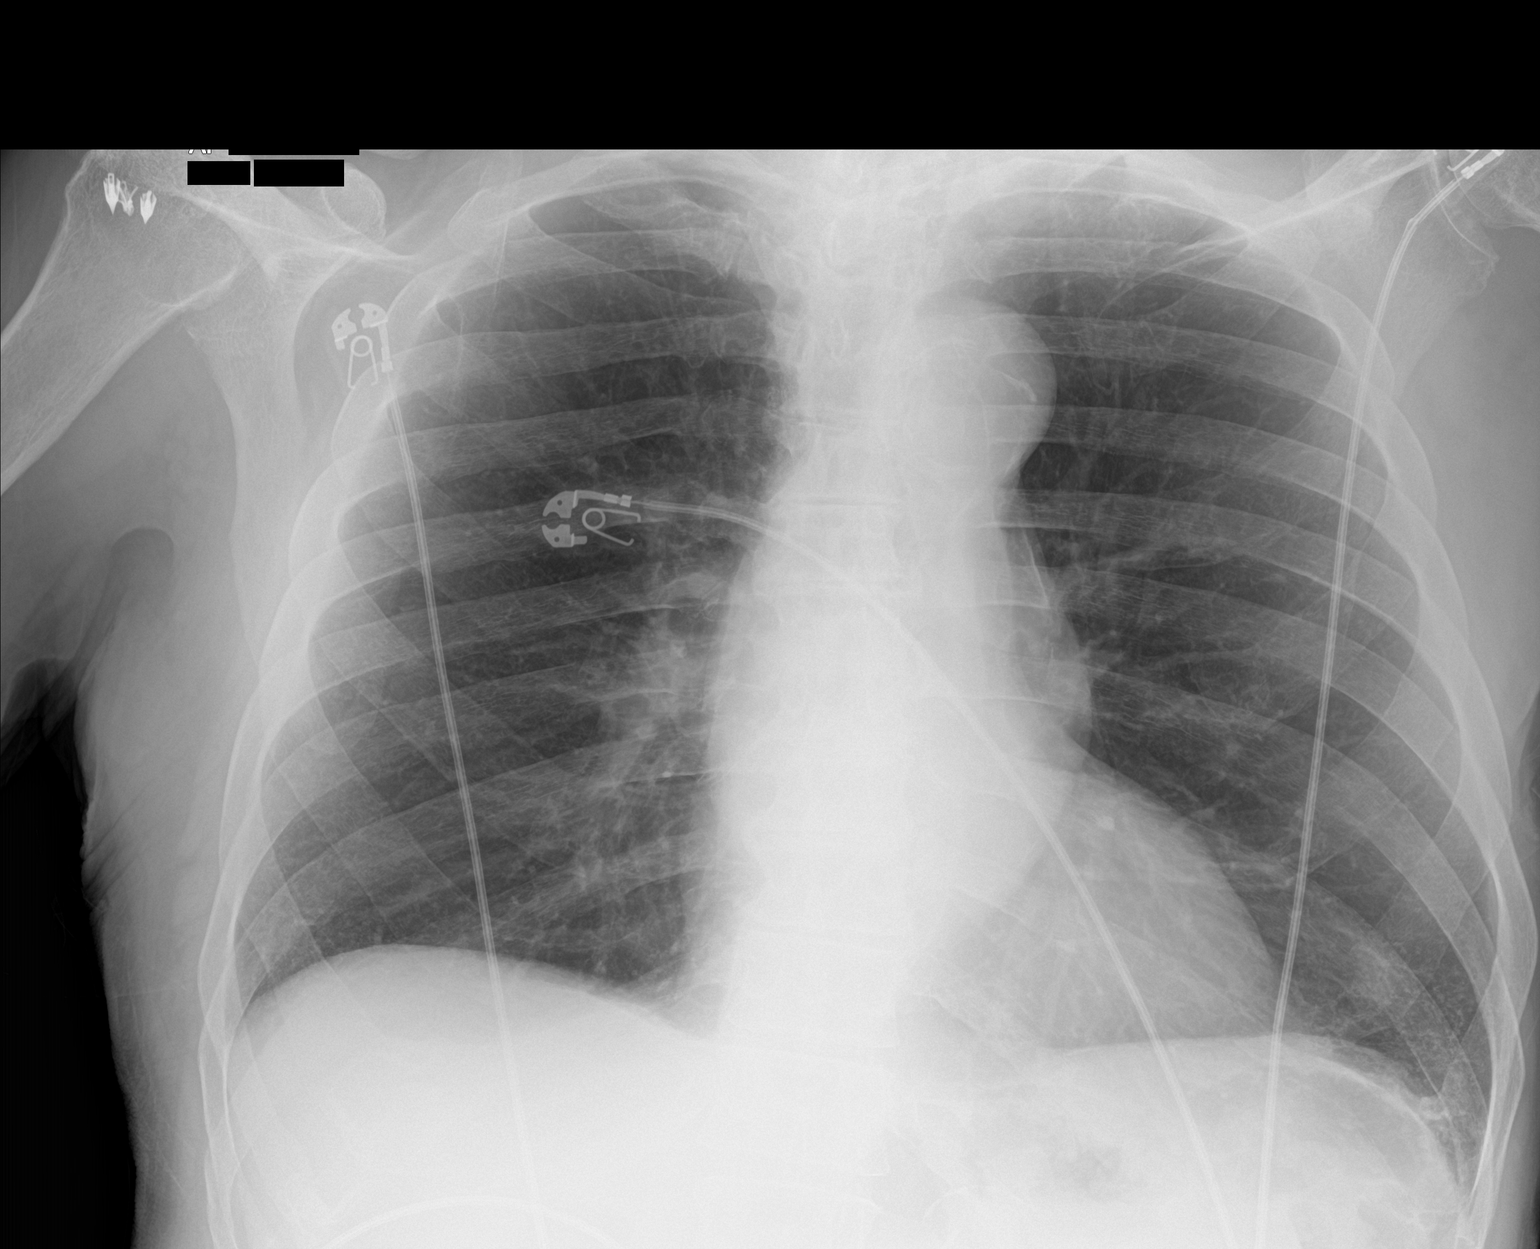

[1 of 1 positions shown; findings below may reference images not displayed]

FINDINGS: Single frontal view of the chest demonstrates a stable cardiac
silhouette. Stable ectasia and atherosclerosis of the thoracic
aorta. No acute airspace disease, effusion, or pneumothorax. Chronic
scarring at the left lung base. No acute bony abnormalities.
IMPRESSION: 1. No acute intrathoracic process.

## 2021-08-23 IMAGING — CR DG HIP (WITH OR WITHOUT PELVIS) 2-3V*L*
3 series · 3 of 3 positions shown · non-contrast
Comparison: [DATE]

CLINICAL DATA: LEFT hip pain after falling.

EXAM:
DG HIP (WITH OR WITHOUT PELVIS) 2-3V LEFT

[t pelvis ap]
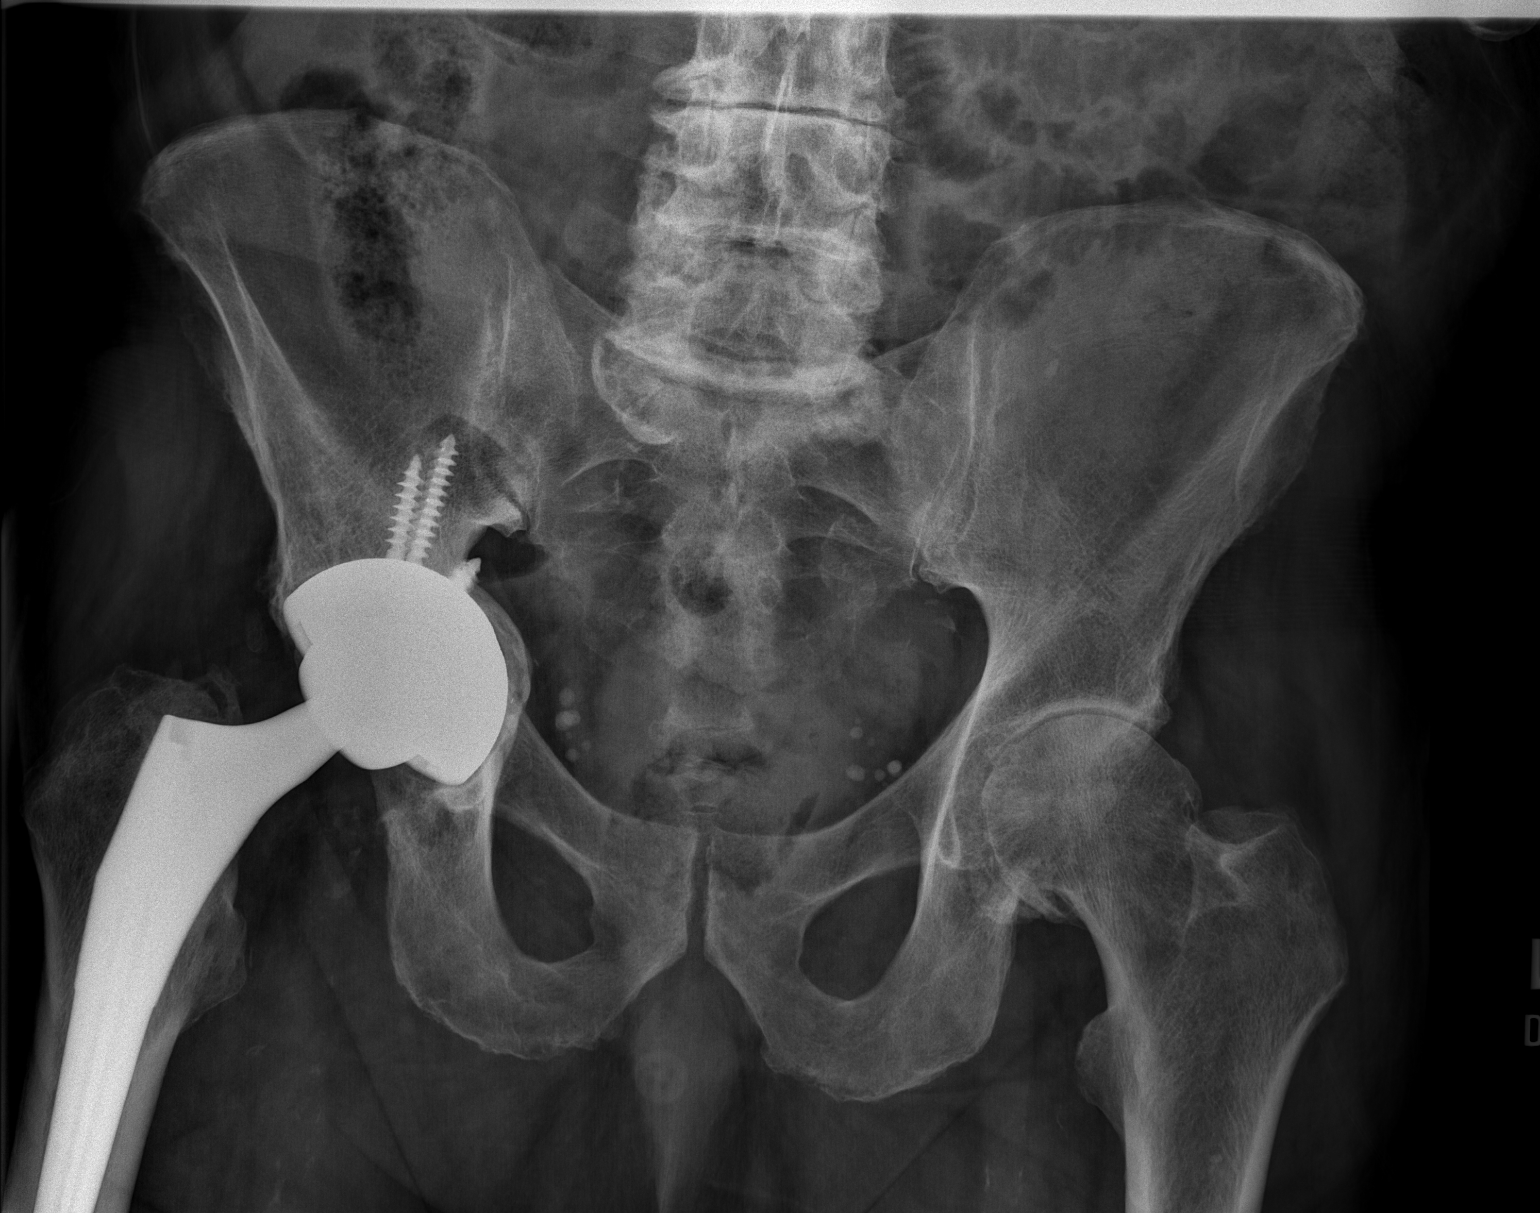

[t hip ap left]
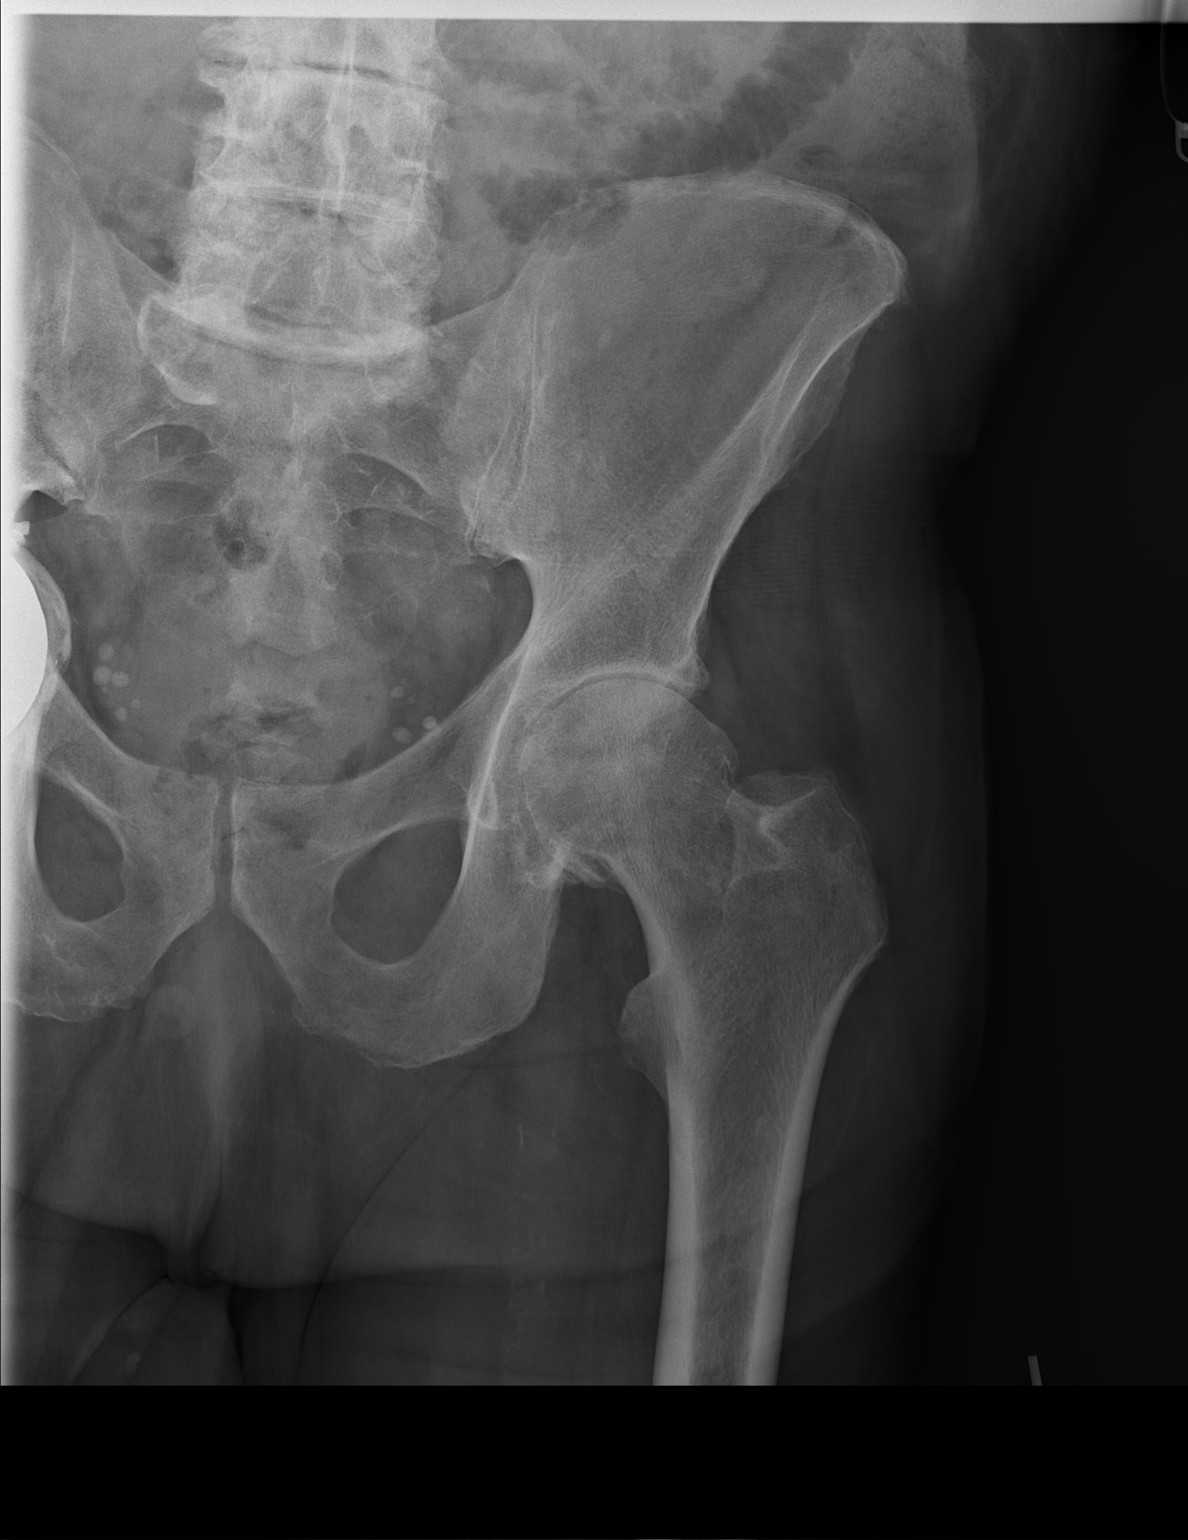

[t hip frog leg left]
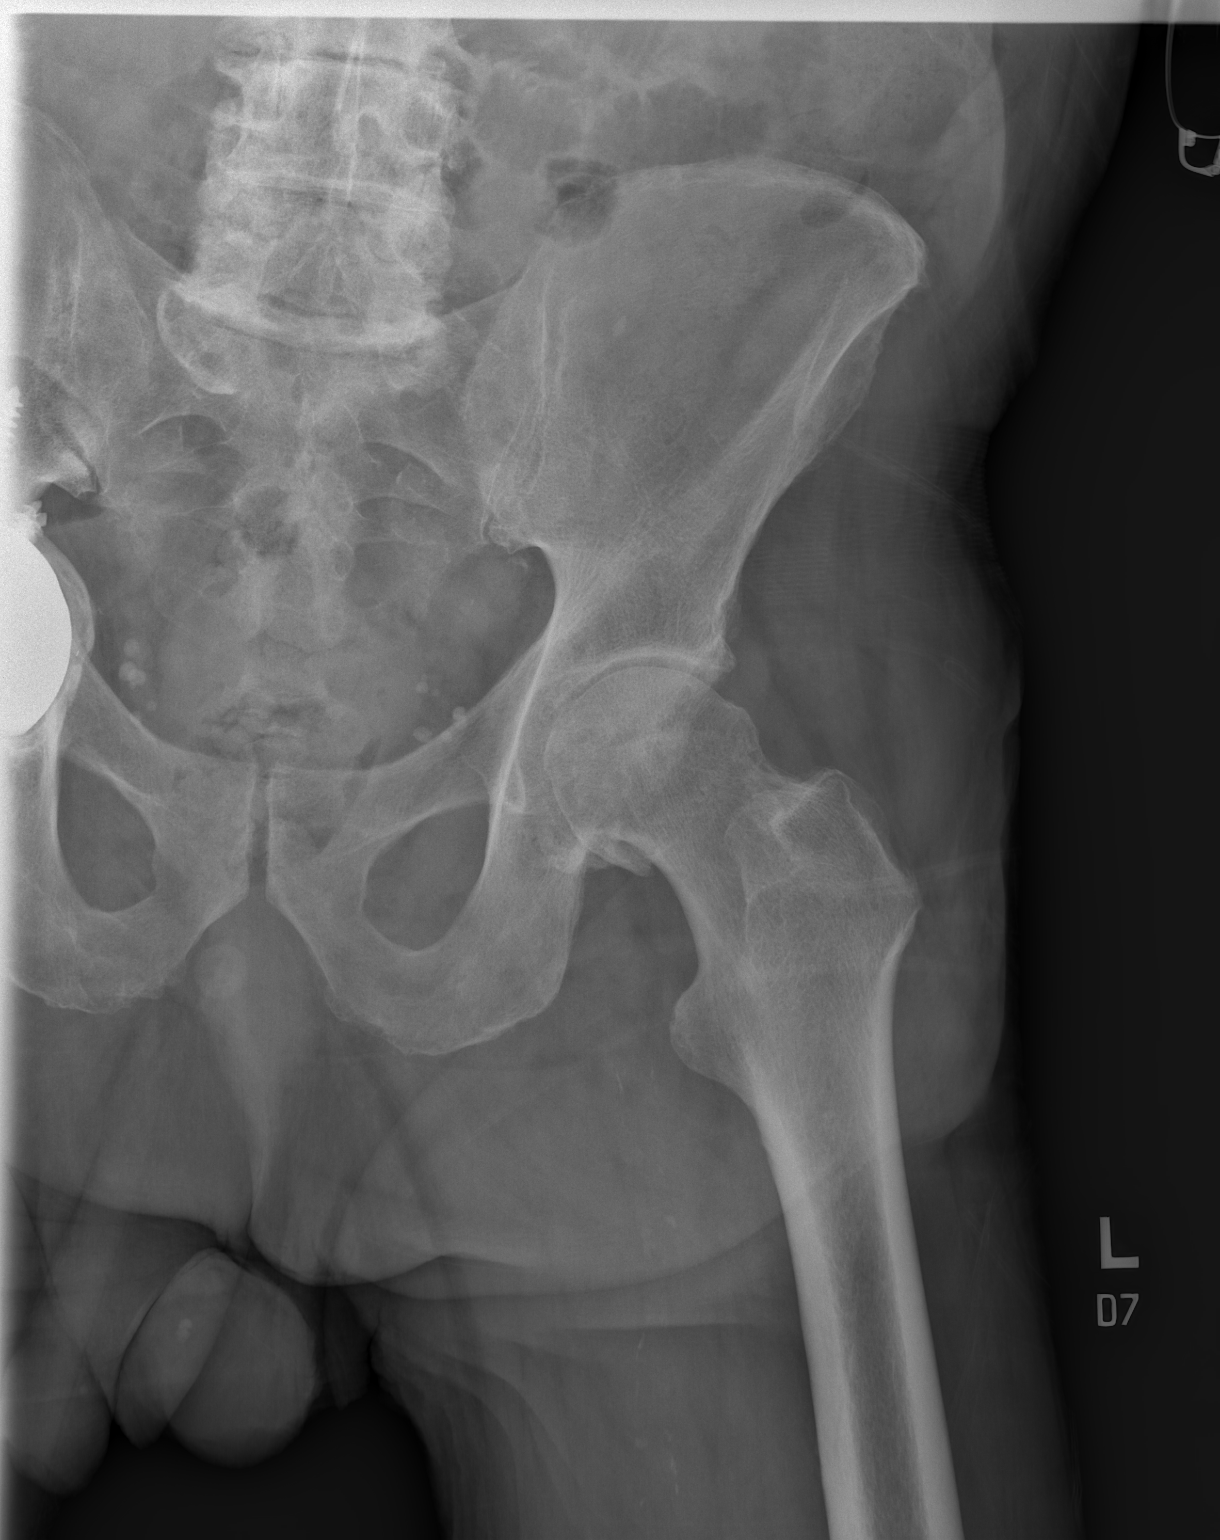

[3 of 3 positions shown; findings below may reference images not displayed]

FINDINGS: Status post RIGHT total hip arthroplasty. There is acetabulum
protrusio on the RIGHT. There is marked degenerative change of the
LEFT hip, with near complete obliteration of the joint space and
large osteophytes, similar to prior study. There is no acute
fracture or subluxation. Degenerative changes are seen in the lumbar
spine.
IMPRESSION: No evidence for acute abnormality. Significant degenerative changes
of the LEFT hip.

## 2021-08-23 MED ORDER — ACETAMINOPHEN 650 MG RE SUPP: 650.0000 mg | Freq: Four times a day (QID) | RECTAL | Status: DC | PRN

## 2021-08-23 MED ORDER — SODIUM CHLORIDE 0.9% FLUSH: 3.0000 mL | INTRAVENOUS | Status: DC | PRN

## 2021-08-23 MED ORDER — DOCUSATE SODIUM 100 MG PO CAPS
100.0000 mg | ORAL_CAPSULE | Freq: Two times a day (BID) | ORAL | Status: DC
  Administered 2021-08-24 – 2021-08-31 (×13): 100 mg via ORAL
  Filled 2021-08-23 (×14): qty 1

## 2021-08-23 MED ORDER — ONDANSETRON HCL 4 MG PO TABS: 4.0000 mg | ORAL_TABLET | Freq: Four times a day (QID) | ORAL | Status: DC | PRN

## 2021-08-23 MED ORDER — ONDANSETRON HCL 4 MG/2ML IJ SOLN: 4.0000 mg | Freq: Four times a day (QID) | INTRAMUSCULAR | Status: DC | PRN

## 2021-08-23 MED ORDER — CLEVIDIPINE BUTYRATE 0.5 MG/ML IV EMUL
0.0000 mg/h | INTRAVENOUS | Status: DC
  Administered 2021-08-23: 10 mg/h via INTRAVENOUS
  Administered 2021-08-23: 17 mg/h via INTRAVENOUS
  Administered 2021-08-23: 2 mg/h via INTRAVENOUS
  Administered 2021-08-23: 18 mg/h via INTRAVENOUS
  Administered 2021-08-24: 16 mg/h via INTRAVENOUS
  Filled 2021-08-23 (×2): qty 50
  Filled 2021-08-23 (×2): qty 100
  Filled 2021-08-23: qty 50

## 2021-08-23 MED ORDER — SODIUM CHLORIDE 0.9 % IV SOLN: 250.0000 mL | INTRAVENOUS | Status: DC | PRN

## 2021-08-23 MED ORDER — SODIUM CHLORIDE 0.9% FLUSH
3.0000 mL | Freq: Two times a day (BID) | INTRAVENOUS | Status: DC
  Administered 2021-08-23 – 2021-08-31 (×14): 3 mL via INTRAVENOUS

## 2021-08-23 MED ORDER — ACETAMINOPHEN 325 MG PO TABS
650.0000 mg | ORAL_TABLET | Freq: Four times a day (QID) | ORAL | Status: DC | PRN
  Administered 2021-08-23 – 2021-08-30 (×3): 650 mg via ORAL
  Filled 2021-08-23 (×3): qty 2

## 2021-08-23 MED ORDER — HYDROCODONE-ACETAMINOPHEN 5-325 MG PO TABS
1.0000 | ORAL_TABLET | ORAL | Status: DC | PRN
  Administered 2021-08-24 – 2021-08-26 (×2): 1 via ORAL
  Administered 2021-08-27: 2 via ORAL
  Administered 2021-08-28: 1 via ORAL
  Administered 2021-08-29: 2 via ORAL
  Filled 2021-08-23: qty 1
  Filled 2021-08-23 (×2): qty 2
  Filled 2021-08-23: qty 1
  Filled 2021-08-23: qty 2
  Filled 2021-08-23: qty 1

## 2021-08-23 MED ORDER — SODIUM CHLORIDE 0.9% FLUSH
3.0000 mL | Freq: Two times a day (BID) | INTRAVENOUS | Status: DC
  Administered 2021-08-24 – 2021-08-31 (×11): 3 mL via INTRAVENOUS

## 2021-08-23 NOTE — ED Provider Notes (Addendum)
[facility] DEPT Provider Note   CSN: [id number] Arrival date & time: 08/23/21  1313     History  Chief Complaint  Patient presents with   Fall   Hematuria   Hip Pain    Brendan Long is a 88 y.o. male.  88 year old male with prior medical history as detailed below presents for evaluation.  Patient reports that around 8 AM today he had a fall in his bathroom.  He reports that he lost his balance.  He did strike his head.  He landed hard on his left hip.  He complains of pain to the left hip since the fall.  He reports that he was able to stand on his own after the fall.  He complains of significant pain to the left hip with ambulation after his fall.  He also noted bloody urination after the fall. He denies suprapubic discomfort.  He denies symptoms of retention.  He denies LOC.  He denies neck pain.  He did strike his head against the floor of his bathroom.  Patient is no longer taking Eliquis.  He stopped taking this several months ago.  The history is provided by the patient and medical records.  Fall This is a new problem. The current episode started 6 to 12 hours ago. The problem occurs constantly. The problem has not changed since onset.Pertinent negatives include no chest pain and no abdominal pain. Nothing aggravates the symptoms. Nothing relieves the symptoms.  Hematuria Pertinent negatives include no chest pain and no abdominal pain.  Hip Pain Pertinent negatives include no chest pain and no abdominal pain.      Home Medications Prior to Admission medications   Medication Sig Start Date End Date Taking? Authorizing Provider  Acetaminophen-Codeine 300-30 MG tablet Take 1 tablet by mouth 3 (three) times daily as needed for pain. 03/17/21   [provider]  apixaban (ELIQUIS) 5 MG TABS tablet Take 1 tablet (5 mg total) by mouth 2 (two) times daily. Patient not taking: No sig reported 05/08/20   Little Ishikawa, MD   atorvastatin (LIPITOR) 80 MG tablet TAKE 1 TABLET DAILY Patient taking differently: Take 80 mg by mouth daily. 07/24/17   Renato Shin, MD  budesonide-formoterol [facility]) 80-4.5 MCG/ACT inhaler Inhale 2 puffs into the lungs 2 (two) times daily. Patient not taking: No sig reported 12/03/17   Renato Shin, MD  clobetasol ointment (TEMOVATE) 0.05 % APPLY TOPICALLY TWICE A DAY Patient not taking: No sig reported 10/21/18   Isaac Bliss, Rayford Halsted, MD  diclofenac sodium (VOLTAREN) 1 % GEL APPLY 4 GRAMS TOPICALLY FOUR TIMES A DAY AS NEEDED FOR KNEE PAIN Patient taking differently: Apply 4 g topically 4 (four) times daily as needed (pain). 04/22/18   Renato Shin, MD  Ibuprofen (ADVIL PO) Take 1 tablet by mouth daily as needed (pain).    [provider]  ketoconazole (NIZORAL) 2 % cream Apply 1 application topically 2 (two) times daily as needed for irritation. 05/19/21   Laurey Morale, MD  metoprolol tartrate (LOPRESSOR) 50 MG tablet Take 50 mg by mouth 2 (two) times daily.    [provider]  omega-3 acid ethyl esters (LOVAZA) 1 G capsule Take 2 capsules (2 g total) by mouth 2 (two) times daily. Patient not taking: Reported on 03/20/2021 03/11/13 03/11/14  Renato Shin, MD  polyethylene glycol (MIRALAX / GLYCOLAX) 17 g packet Take 17 g by mouth daily as needed for severe constipation. Patient not taking: No sig reported  03/21/19   Monica Becton, MD  prochlorperazine (COMPAZINE) 10 MG tablet Take 10 mg by mouth every 6 (six) hours as needed for nausea or vomiting.    [provider]  sildenafil (VIAGRA) 100 MG tablet TAKE 1 TABLET BY MOUTH 1 HOUR BEFORE NEEDED FOR ERECTILE DYSFUNCTION Patient not taking: No sig reported 10/08/20   Isaac Bliss, Rayford Halsted, MD  TRICOR 48 MG tablet TAKE 1 TABLET DAILY Patient taking differently: Take 48 mg by mouth daily. 07/15/19   Isaac Bliss, Rayford Halsted, MD  Vitamin D, Ergocalciferol, (DRISDOL) 1.25 MG (50000 UNIT) CAPS capsule  TAKE 1 CAPSULE EVERY 7 DAYS FOR 12 DOSES Patient not taking: No sig reported 02/13/20   Isaac Bliss, Rayford Halsted, MD      Allergies    Patient has no known allergies.    Review of Systems   Review of Systems  Cardiovascular:  Negative for chest pain.  Gastrointestinal:  Negative for abdominal pain.  Genitourinary:  Positive for hematuria.  All other systems reviewed and are negative.  Physical Exam Updated Vital Signs BP (!) 178/110 (BP Location: Left Arm)    Pulse 78    Temp 98.2 F (36.8 C) (Oral)    Resp 16    Ht 5' 10.5" (1.791 m)    Wt 74.8 kg    SpO2 99%    BMI 23.34 kg/m  Physical Exam Vitals and nursing note reviewed.  Constitutional:      General: He is not in acute distress.    Appearance: Normal appearance. He is well-developed.  HENT:     Head: Normocephalic and atraumatic.  Eyes:     Conjunctiva/sclera: Conjunctivae normal.     Pupils: Pupils are equal, round, and reactive to light.  Cardiovascular:     Rate and Rhythm: Normal rate and regular rhythm.     Heart sounds: Normal heart sounds.  Pulmonary:     Effort: Pulmonary effort is normal. No respiratory distress.     Breath sounds: Normal breath sounds.  Abdominal:     General: There is no distension.     Palpations: Abdomen is soft.     Tenderness: There is no abdominal tenderness.  Musculoskeletal:        General: Tenderness present. No deformity. Normal range of motion.     Cervical back: Normal range of motion and neck supple.     Comments: Tenderness to palpation along the left lateral hip.  Skin:    General: Skin is warm and dry.  Neurological:     General: No focal deficit present.     Mental Status: He is alert and oriented to person, place, and time. Mental status is at baseline.     Cranial Nerves: No cranial nerve deficit.     Sensory: No sensory deficit.     Motor: No weakness.     Coordination: Coordination normal.     Comments: GCS 15  AO x4  Normal speech  No facial droop  5  out of 5 strength in both upper and lower extremity    ED Results / Procedures / Treatments   Labs (all labs ordered are listed, but only abnormal results are displayed) Labs Reviewed  URINE CULTURE  RESP PANEL BY RT-PCR (FLU A&B, COVID) ARPGX2  URINALYSIS, ROUTINE W REFLEX MICROSCOPIC  COMPREHENSIVE METABOLIC PANEL  CBC WITH DIFFERENTIAL/PLATELET  PROTIME-INR    EKG None  Radiology CT HEAD WO CONTRAST (5MM)  Result Date: 08/23/2021 CLINICAL DATA:  Neck pain, left hip pain  status post fall this morning. EXAM: CT HEAD WITHOUT CONTRAST CT CERVICAL SPINE WITHOUT CONTRAST TECHNIQUE: Multidetector CT imaging of the head and cervical spine was performed following the standard protocol without intravenous contrast. Multiplanar CT image reconstructions of the cervical spine were also generated. RADIATION DOSE REDUCTION: This exam was performed according to the departmental dose-optimization program which includes automated exposure control, adjustment of the mA and/or kV according to patient size and/or use of iterative reconstruction technique. COMPARISON:  None. FINDINGS: Brain: Acute hemorrhage along the left cerebral convexity along the parietal lobe measuring 7 mm in thickness. 1-2 mm left-to-right midline shift. No evidence of acute infarction, ventriculomegaly, or mass effect. 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 generalized cerebral atrophy. Periventricular white matter low attenuation likely secondary to microangiopathy. Vascular: Cerebrovascular atherosclerotic calcifications are noted. No hyperdense vessels. Skull: Negative for fracture or focal lesion. Sinuses/Orbits: Visualized portions of the orbits are unremarkable. Visualized portions of the paranasal sinuses are unremarkable. Visualized portions of the mastoid air cells are unremarkable. Other: None. CT CERVICAL SPINE FINDINGS Alignment: Normal. Skull base and vertebrae: No acute fracture. No primary bone lesion or  focal pathologic process. Soft tissues and spinal canal: No prevertebral fluid or swelling. No visible canal hematoma. Disc levels: Degenerative disease with disc height loss at C3-4, C4-5, C5-6, C6-7, C7-T1 and T1-2 with mild bilateral facet arthropathy. Bilateral uncovertebral degenerative changes at C3-4 and C4-5 with moderate-severe foraminal stenosis. Bilateral uncovertebral degenerative changes at C5-6 with mild foraminal stenosis. Upper chest: Lung apices are clear. Other: No fluid collection or hematoma. IMPRESSION: 1. Acute subdural hemorrhage along the left parietal convexity measuring 7 mm in thickness. 1-2 mm left right midline shift. 2.  No acute osseous injury of the cervical spine. Critical Value/emergent results were called by telephone at the time of interpretation on 08/23/2021 at 2:17 pm to provider [facility] , who verbally acknowledged these results. Electronically Signed   By: Kathreen Devoid M.D.   On: 08/23/2021 14:19   CT Cervical Spine Wo Contrast  Result Date: 08/23/2021 CLINICAL DATA:  Neck pain, left hip pain status post fall this morning. EXAM: CT HEAD WITHOUT CONTRAST CT CERVICAL SPINE WITHOUT CONTRAST TECHNIQUE: Multidetector CT imaging of the head and cervical spine was performed following the standard protocol without intravenous contrast. Multiplanar CT image reconstructions of the cervical spine were also generated. RADIATION DOSE REDUCTION: This exam was performed according to the departmental dose-optimization program which includes automated exposure control, adjustment of the mA and/or kV according to patient size and/or use of iterative reconstruction technique. COMPARISON:  None. FINDINGS: Brain: Acute hemorrhage along the left cerebral convexity along the parietal lobe measuring 7 mm in thickness. 1-2 mm left-to-right midline shift. No evidence of acute infarction, ventriculomegaly, or mass effect. 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 generalized  cerebral atrophy. Periventricular white matter low attenuation likely secondary to microangiopathy. Vascular: Cerebrovascular atherosclerotic calcifications are noted. No hyperdense vessels. Skull: Negative for fracture or focal lesion. Sinuses/Orbits: Visualized portions of the orbits are unremarkable. Visualized portions of the paranasal sinuses are unremarkable. Visualized portions of the mastoid air cells are unremarkable. Other: None. CT CERVICAL SPINE FINDINGS Alignment: Normal. Skull base and vertebrae: No acute fracture. No primary bone lesion or focal pathologic process. Soft tissues and spinal canal: No prevertebral fluid or swelling. No visible canal hematoma. Disc levels: Degenerative disease with disc height loss at C3-4, C4-5, C5-6, C6-7, C7-T1 and T1-2 with mild bilateral facet arthropathy. Bilateral uncovertebral degenerative changes at C3-4 and C4-5 with  moderate-severe foraminal stenosis. Bilateral uncovertebral degenerative changes at C5-6 with mild foraminal stenosis. Upper chest: Lung apices are clear. Other: No fluid collection or hematoma. IMPRESSION: 1. Acute subdural hemorrhage along the left parietal convexity measuring 7 mm in thickness. 1-2 mm left right midline shift. 2.  No acute osseous injury of the cervical spine. Critical Value/emergent results were called by telephone at the time of interpretation on 08/23/2021 at 2:17 pm to provider [facility] , who verbally acknowledged these results. Electronically Signed   By: Kathreen Devoid M.D.   On: 08/23/2021 14:19   DG Hip Unilat W or Wo Pelvis 2-3 Views Left  Result Date: 08/23/2021 CLINICAL DATA:  LEFT hip pain after falling. EXAM: DG HIP (WITH OR WITHOUT PELVIS) 2-3V LEFT COMPARISON:  03/07/2016 FINDINGS: Status post RIGHT total hip arthroplasty. 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 There is marked degenerative change of the LEFT hip, with near complete obliteration of the joint space and large osteophytes, similar to  prior study. There is no acute fracture or subluxation. Degenerative changes are seen in the lumbar spine. IMPRESSION: No evidence for acute abnormality. Significant degenerative changes of the LEFT hip. Electronically Signed   By: Nolon Nations M.D.   On: 08/23/2021 14:25    Procedures Procedures    Medications Ordered in ED Medications - No data to display  ED Course/ Medical Decision Making/ A&P                           Medical Decision Making Amount and/or Complexity of Data Reviewed Labs: ordered. Radiology: ordered.  Risk Decision regarding hospitalization.    Medical Screen Complete  This patient presented to the ED with complaint of fall, head injury, left hip pain.  This complaint involves an extensive number of treatment options. The initial differential diagnosis includes, but is not limited to, fracture, intra-cranial trauma, metabolic abdnormality  This presentation is: Acute, Self-Limited, Previously Undiagnosed, Uncertain Prognosis, Complicated, Systemic Symptoms, and Threat to Life/Bodily Function  Presented after fall from standing earlier this morning.  Patient presented to the ED complaining primarily of left hip pain after the fall.   He did strike his head in the fall.  He is neurologically intact.  Patient is not currently taking Eliquis.  He reports that he stopped taking it several months ago.  Imaging reveals evidence of subdural hematoma.  Case is discussed with Dr. Trenton Gammon with neurosurgery.  Patient to be admitted to his service.  Patient will require ICU bed at [facility].  Plain films of the left hip are without clear evidence of fracture.  Patient will require admission.  Patient understands plan of care.      Co morbidities that complicated the patient's evaluation  Advanced age, possible anti-coagulant use   Additional history obtained:  External records from outside sources obtained and reviewed including prior ED visits and  prior Inpatient records.    Lab Tests:  I ordered and personally interpreted labs.  The pertinent results include:  CBC, CMP, Covid, INR   Imaging Studies ordered:  I ordered imaging studies including CXR, L hip, CT HEAD and CT Cspine, CT L Hip  I independently visualized and interpreted obtained imaging which showed subdural hematoma I agree with the radiologist interpretation.   Cardiac Monitoring:  The patient was maintained on a cardiac monitor.  I personally viewed and interpreted the cardiac monitor which showed an underlying rhythm of: Sinus rhythm     Consultations  Obtained:  I consulted Dr. Trenton Gammon with Neurosurgery,  and discussed lab and imaging findings as well as pertinent plan of care.    Problem List / ED Course:  SDH   Reevaluation:  After the interventions noted above, I reevaluated the patient and found that they have: improved     Dispostion:  After consideration of the diagnostic results and the patients response to treatment, I feel that the patent would benefit from admission for additional workup and treatment.   CRITICAL CARE Performed by: Valarie Merino   Total critical care time: 30 minutes  Critical care time was exclusive of separately billable procedures and treating other patients.  Critical care was necessary to treat or prevent imminent or life-threatening deterioration.  Critical care was time spent personally by me on the following activities: development of treatment plan with patient and/or surrogate as well as nursing, discussions with consultants, evaluation of patient's response to treatment, examination of patient, obtaining history from patient or surrogate, ordering and performing treatments and interventions, ordering and review of laboratory studies, ordering and review of radiographic studies, pulse oximetry and re-evaluation of patient's condition.          Final Clinical Impression(s) / ED Diagnoses Final  diagnoses:  Subdural hematoma    Rx / DC Orders ED Discharge Orders     None         Valarie Merino, MD 08/23/21 1517    Valarie Merino, MD 08/23/21 1524

## 2021-08-23 NOTE — ED Notes (Signed)
Pt's daughter, [name], called and notified that pt is here and updated on plan of care and pt condition as ok'd by pt.

## 2021-08-23 NOTE — ED Notes (Signed)
Neuro assessment preform on pt.  Noticeable weakness on pt's left side, upper and lower extremity.

## 2021-08-23 NOTE — ED Triage Notes (Signed)
Patient states he got up to go to the bathroom  and the phone and door bell were ringing so he turned and lost his balance. Patient states he fell behind the toilet.  Patient states he "heard a crunch in his left hip." Patient states after the fall he "had blood and clots in his urine." Patient states he hit his head on the floor. Patient denies LOC.

## 2021-08-23 NOTE — ED Notes (Signed)
[facility] present to ed to transport to [facility].  Report called to Willia Craze., RN.  All questions answered and plan of care discussed.  Pt stable and in NAD at this time.

## 2021-08-23 NOTE — Progress Notes (Signed)
Brendan Long is an 88 y.o. male.   Chief Complaint: Status post fall HPI: 88 year old male status post fall from standing height.  Fall mechanical in nature not associated with syncope.  Patient with immediate onset of left hip pain/buttock pain and some headache.  No loss of consciousness.  No symptoms of numbness paresthesias or weakness.  Prior history of old stroke.  Patient not currently anticoagulated.  Past Medical History:  Diagnosis Date   ED (erectile dysfunction)    Hyperlipidemia    Hypertension    Hypothyroidism    Psoriasis    Stroke ([place])    Varicose veins    Vitamin D deficiency     Past Surgical History:  Procedure Laterality Date   JOINT REPLACEMENT     joint replacement rt and left     rotator cuff repair, right shoulder per Dr. Tonita Cong  1996   torn right bicepts muscle  [place]  01/17/10   redone on right hip on 09/05/10    Family History  Problem Relation Age of Onset   Breast cancer Other    Social History:  reports that he has quit smoking. He has never used smokeless tobacco. He reports that he does not drink alcohol and does not use drugs.  Allergies: No Known Allergies  Medications Prior to Admission  Medication Sig Dispense Refill   Acetaminophen-Codeine 300-30 MG tablet Take 1 tablet by mouth 3 (three) times daily as needed for pain.     apixaban (ELIQUIS) 5 MG TABS tablet Take 1 tablet (5 mg total) by mouth 2 (two) times daily. (Patient not taking: No sig reported) 60 tablet 0   atorvastatin (LIPITOR) 80 MG tablet TAKE 1 TABLET DAILY (Patient taking differently: Take 80 mg by mouth daily.) 90 tablet 2   budesonide-formoterol (SYMBICORT) 80-4.5 MCG/ACT inhaler Inhale 2 puffs into the lungs 2 (two) times daily. (Patient not taking: No sig reported) 1 Inhaler 3   clobetasol ointment (TEMOVATE) 0.05 % APPLY TOPICALLY TWICE A DAY (Patient not taking: No sig reported) 30 g 23   diclofenac sodium (VOLTAREN) 1 % GEL APPLY 4 GRAMS  TOPICALLY FOUR TIMES A DAY AS NEEDED FOR KNEE PAIN (Patient taking differently: Apply 4 g topically 4 (four) times daily as needed (pain).) 200 g 28   Ibuprofen (ADVIL PO) Take 1 tablet by mouth daily as needed (pain).     ketoconazole (NIZORAL) 2 % cream Apply 1 application topically 2 (two) times daily as needed for irritation. 30 g 0   metoprolol tartrate (LOPRESSOR) 50 MG tablet Take 50 mg by mouth 2 (two) times daily.     omega-3 acid ethyl esters (LOVAZA) 1 G capsule Take 2 capsules (2 g total) by mouth 2 (two) times daily. (Patient not taking: Reported on 03/20/2021) 120 capsule 11   polyethylene glycol (MIRALAX / GLYCOLAX) 17 g packet Take 17 g by mouth daily as needed for severe constipation. (Patient not taking: No sig reported) 14 each 0   prochlorperazine (COMPAZINE) 10 MG tablet Take 10 mg by mouth every 6 (six) hours as needed for nausea or vomiting.     sildenafil (VIAGRA) 100 MG tablet TAKE 1 TABLET BY MOUTH 1 HOUR BEFORE NEEDED FOR ERECTILE DYSFUNCTION (Patient not taking: No sig reported) 10 tablet 2   TRICOR 48 MG tablet TAKE 1 TABLET DAILY (Patient taking differently: Take 48 mg by mouth daily.) 90 tablet 3   Vitamin D, Ergocalciferol, (DRISDOL) 1.25 MG (50000 UNIT) CAPS capsule  TAKE 1 CAPSULE EVERY 7 DAYS FOR 12 DOSES (Patient not taking: No sig reported) 12 capsule 0    Results for orders placed or performed during the hospital encounter of 08/23/21 (from the past 48 hour(s))  Urinalysis, Routine w reflex microscopic Urine, Clean Catch     Status: Abnormal   Collection Time: 08/23/21  2:47 PM  Result Value Ref Range   Color, Urine AMBER (A) YELLOW    Comment: BIOCHEMICALS MAY BE AFFECTED BY COLOR   APPearance CLOUDY (A) CLEAR   Specific Gravity, Urine 1.018 1.005 - 1.030   pH 5.0 5.0 - 8.0   Glucose, UA NEGATIVE NEGATIVE mg/dL   Hgb urine dipstick LARGE (A) NEGATIVE   Bilirubin Urine NEGATIVE NEGATIVE   Ketones, ur 5 (A) NEGATIVE mg/dL   Protein, ur 100 (A) NEGATIVE  mg/dL   Nitrite NEGATIVE NEGATIVE   Leukocytes,Ua TRACE (A) NEGATIVE   RBC / HPF >50 (H) 0 - 5 RBC/hpf   WBC, UA 11-20 0 - 5 WBC/hpf   Bacteria, UA NONE SEEN NONE SEEN   Squamous Epithelial / LPF 0-5 0 - 5   Mucus PRESENT    Hyaline Casts, UA PRESENT     Comment: Performed at [facility], [place] [address], [place], [place] [address]  Comprehensive metabolic panel     Status: Abnormal   Collection Time: 08/23/21  2:47 PM  Result Value Ref Range   Sodium 134 (L) 135 - 145 mmol/L   Potassium 3.7 3.5 - 5.1 mmol/L   Chloride 103 98 - 111 mmol/L   CO2 25 22 - 32 mmol/L   Glucose, Bld 87 70 - 99 mg/dL    Comment: Glucose reference range applies only to samples taken after fasting for at least 8 hours.   BUN 30 (H) 8 - 23 mg/dL   Creatinine, Ser 1.80 (H) 0.61 - 1.24 mg/dL   Calcium 9.0 8.9 - 10.3 mg/dL   Total Protein 6.9 6.5 - 8.1 g/dL   Albumin 3.9 3.5 - 5.0 g/dL   AST 23 15 - 41 U/L   ALT 15 0 - 44 U/L   Alkaline Phosphatase 69 38 - 126 U/L   Total Bilirubin 2.2 (H) 0.3 - 1.2 mg/dL   GFR, Estimated 35 (L) >60 mL/min    Comment: (NOTE) Calculated using the CKD-EPI Creatinine Equation (2021)    Anion gap 6 5 - 15    Comment: Performed at [facility], [place] [address], [place], [place] [address]  CBC with Differential     Status: Abnormal   Collection Time: 08/23/21  2:47 PM  Result Value Ref Range   WBC 10.8 (H) 4.0 - 10.5 K/uL   RBC 4.93 4.22 - 5.81 MIL/uL   Hemoglobin 15.2 13.0 - 17.0 g/dL   HCT 46.2 39.0 - 52.0 %   MCV 93.7 80.0 - 100.0 fL   MCH 30.8 26.0 - 34.0 pg   MCHC 32.9 30.0 - 36.0 g/dL   RDW 12.9 11.5 - 15.5 %   Platelets 196 150 - 400 K/uL   nRBC 0.0 0.0 - 0.2 %   Neutrophils Relative % 67 %   Neutro Abs 7.4 1.7 - 7.7 K/uL   Lymphocytes Relative 21 %   Lymphs Abs 2.3 0.7 - 4.0 K/uL   Monocytes Relative 6 %   Monocytes Absolute 0.6 0.1 - 1.0 K/uL   Eosinophils Relative 5 %   Eosinophils Absolute 0.5 0.0 - 0.5 K/uL    Basophils Relative  1 %   Basophils Absolute 0.1 0.0 - 0.1 K/uL   Immature Granulocytes 0 %   Abs Immature Granulocytes 0.03 0.00 - 0.07 K/uL    Comment: Performed at [facility], [place] [address], [place], [place] [address]  Protime-INR     Status: None   Collection Time: 08/23/21  2:47 PM  Result Value Ref Range   Prothrombin Time 13.2 11.4 - 15.2 seconds   INR 1.0 0.8 - 1.2    Comment: (NOTE) INR goal varies based on device and disease states. Performed at [facility], [place] [address] [place], [place] [address]   Resp Panel by RT-PCR (Flu A&B, Covid) Nasopharyngeal Swab     Status: None   Collection Time: 08/23/21  2:47 PM   Specimen: Nasopharyngeal Swab; Nasopharyngeal(NP) swabs in vial transport medium  Result Value Ref Range   SARS Coronavirus 2 by RT PCR NEGATIVE NEGATIVE    Comment: (NOTE) SARS-CoV-2 target nucleic acids are NOT DETECTED.  The SARS-CoV-2 RNA is generally detectable in upper respiratory specimens during the acute phase of infection. The lowest concentration of SARS-CoV-2 viral copies this assay can detect is 138 copies/mL. A negative result does not preclude SARS-Cov-2 infection and should not be used as the sole basis for treatment or other patient management decisions. A negative result may occur with  improper specimen collection/handling, submission of specimen other than nasopharyngeal swab, presence of viral mutation(s) within the areas targeted by this assay, and inadequate number of viral copies(<138 copies/mL). A negative result must be combined with clinical observations, patient history, and epidemiological information. The expected result is Negative.  Fact Sheet for Patients:  EntrepreneurPulse.com.au  Fact Sheet for Healthcare Providers:  IncredibleEmployment.be  This test is no t yet approved or cleared by the [place] FDA and  has been authorized for  detection and/or diagnosis of SARS-CoV-2 by FDA under an Emergency Use Authorization (EUA). This EUA will remain  in effect (meaning this test can be used) for the duration of the COVID-19 declaration under Section 564(b)(1) of the Act, 21 U.S.C.section 360bbb-3(b)(1), unless the authorization is terminated  or revoked sooner.       Influenza A by PCR NEGATIVE NEGATIVE   Influenza B by PCR NEGATIVE NEGATIVE    Comment: (NOTE) The Xpert Xpress SARS-CoV-2/FLU/RSV plus assay is intended as an aid in the diagnosis of influenza from Nasopharyngeal swab specimens and should not be used as a sole basis for treatment. Nasal washings and aspirates are unacceptable for Xpert Xpress SARS-CoV-2/FLU/RSV testing.  Fact Sheet for Patients: EntrepreneurPulse.com.au  Fact Sheet for Healthcare Providers: IncredibleEmployment.be  This test is not yet approved or cleared by the [place] FDA and has been authorized for detection and/or diagnosis of SARS-CoV-2 by FDA under an Emergency Use Authorization (EUA). This EUA will remain in effect (meaning this test can be used) for the duration of the COVID-19 declaration under Section 564(b)(1) of the Act, 21 U.S.C. section 360bbb-3(b)(1), unless the authorization is terminated or revoked.  Performed at [facility], [place] [address], [place], [place] [address]    CT HEAD WO CONTRAST (5MM)  Result Date: 08/23/2021 CLINICAL DATA:  Neck pain, left hip pain status post fall this morning. EXAM: CT HEAD WITHOUT CONTRAST CT CERVICAL SPINE WITHOUT CONTRAST TECHNIQUE: Multidetector CT imaging of the head and cervical spine was performed following the standard protocol without intravenous contrast. Multiplanar CT image reconstructions of the cervical spine were also generated. RADIATION DOSE REDUCTION: This exam  was performed according to the departmental dose-optimization program which includes automated  exposure control, adjustment of the mA and/or kV according to patient size and/or use of iterative reconstruction technique. COMPARISON:  None. FINDINGS: Brain: Acute hemorrhage along the left cerebral convexity along the parietal lobe measuring 7 mm in thickness. 1-2 mm left-to-right midline shift. No evidence of acute infarction, ventriculomegaly, or mass effect. 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 generalized cerebral atrophy. Periventricular white matter low attenuation likely secondary to microangiopathy. Vascular: Cerebrovascular atherosclerotic calcifications are noted. No hyperdense vessels. Skull: Negative for fracture or focal lesion. Sinuses/Orbits: Visualized portions of the orbits are unremarkable. Visualized portions of the paranasal sinuses are unremarkable. Visualized portions of the mastoid air cells are unremarkable. Other: None. CT CERVICAL SPINE FINDINGS Alignment: Normal. Skull base and vertebrae: No acute fracture. No primary bone lesion or focal pathologic process. Soft tissues and spinal canal: No prevertebral fluid or swelling. No visible canal hematoma. Disc levels: Degenerative disease with disc height loss at C3-4, C4-5, C5-6, C6-7, C7-T1 and T1-2 with mild bilateral facet arthropathy. Bilateral uncovertebral degenerative changes at C3-4 and C4-5 with moderate-severe foraminal stenosis. Bilateral uncovertebral degenerative changes at C5-6 with mild foraminal stenosis. Upper chest: Lung apices are clear. Other: No fluid collection or hematoma. IMPRESSION: 1. Acute subdural hemorrhage along the left parietal convexity measuring 7 mm in thickness. 1-2 mm left right midline shift. 2.  No acute osseous injury of the cervical spine. Critical Value/emergent results were called by telephone at the time of interpretation on 08/23/2021 at 2:17 pm to provider [facility] , who verbally acknowledged these results. Electronically Signed   By: Kathreen Devoid M.D.   On: 08/23/2021  14:19   CT Cervical Spine Wo Contrast  Result Date: 08/23/2021 CLINICAL DATA:  Neck pain, left hip pain status post fall this morning. EXAM: CT HEAD WITHOUT CONTRAST CT CERVICAL SPINE WITHOUT CONTRAST TECHNIQUE: Multidetector CT imaging of the head and cervical spine was performed following the standard protocol without intravenous contrast. Multiplanar CT image reconstructions of the cervical spine were also generated. RADIATION DOSE REDUCTION: This exam was performed according to the departmental dose-optimization program which includes automated exposure control, adjustment of the mA and/or kV according to patient size and/or use of iterative reconstruction technique. COMPARISON:  None. FINDINGS: Brain: Acute hemorrhage along the left cerebral convexity along the parietal lobe measuring 7 mm in thickness. 1-2 mm left-to-right midline shift. No evidence of acute infarction, ventriculomegaly, or mass effect. 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 generalized cerebral atrophy. Periventricular white matter low attenuation likely secondary to microangiopathy. Vascular: Cerebrovascular atherosclerotic calcifications are noted. No hyperdense vessels. Skull: Negative for fracture or focal lesion. Sinuses/Orbits: Visualized portions of the orbits are unremarkable. Visualized portions of the paranasal sinuses are unremarkable. Visualized portions of the mastoid air cells are unremarkable. Other: None. CT CERVICAL SPINE FINDINGS Alignment: Normal. Skull base and vertebrae: No acute fracture. No primary bone lesion or focal pathologic process. Soft tissues and spinal canal: No prevertebral fluid or swelling. No visible canal hematoma. Disc levels: Degenerative disease with disc height loss at C3-4, C4-5, C5-6, C6-7, C7-T1 and T1-2 with mild bilateral facet arthropathy. Bilateral uncovertebral degenerative changes at C3-4 and C4-5 with moderate-severe foraminal stenosis. Bilateral uncovertebral  degenerative changes at C5-6 with mild foraminal stenosis. Upper chest: Lung apices are clear. Other: No fluid collection or hematoma. IMPRESSION: 1. Acute subdural hemorrhage along the left parietal convexity measuring 7 mm in thickness. 1-2 mm left right midline shift. 2.  No  acute osseous injury of the cervical spine. Critical Value/emergent results were called by telephone at the time of interpretation on 08/23/2021 at 2:17 pm to provider [facility] , who verbally acknowledged these results. Electronically Signed   By: Kathreen Devoid M.D.   On: 08/23/2021 14:19   CT Hip Left Wo Contrast  Result Date: 08/23/2021 CLINICAL DATA:  Hip trauma.  Fracture suspected. EXAM: CT OF THE LEFT HIP WITHOUT CONTRAST TECHNIQUE: Multidetector CT imaging of the left hip was performed according to the standard protocol. Multiplanar CT image reconstructions were also generated. RADIATION DOSE REDUCTION: This exam was performed according to the departmental dose-optimization program which includes automated exposure control, adjustment of the mA and/or kV according to patient size and/or use of iterative reconstruction technique. COMPARISON:  Pelvis and left hip radiographs 08/23/2021 FINDINGS: Bones/Joint/Cartilage Severe left femoroacetabular osteoarthritis, greatest within the posterosuperior aspect. Moderate subchondral sclerosis and subchondral degenerative cystic change. Moderate to high-grade left femoral head-neck junction and moderate peripheral left acetabular degenerative osteophytosis. Bridging degenerative osteophytes within the visualized portion of the inferior left sacroiliac joint. More mild endplate osteophytes of the right sacroiliac joint. Streak artifact from total right hip arthroplasty hardware. No perihardware lucency is seen to indicate hardware failure or loosening. No acute fracture is seen. Ligaments Suboptimally assessed by CT. Muscles and Tendons Grossly unremarkable. Soft tissues No left hip  joint effusion. Moderate vascular calcifications are noted. Mild-to-moderate fat containing left inguinal hernia. IMPRESSION:: IMPRESSION: 1. No left hip fracture is seen. 2. Severe left femoroacetabular osteoarthritis. 3. Status post total right hip arthroplasty without evidence of hardware failure. Electronically Signed   By: Yvonne Kendall M.D.   On: 08/23/2021 16:01   DG Chest Port 1 View  Result Date: 08/23/2021 CLINICAL DATA:  Neck and left hip pain, hematuria, fell EXAM: PORTABLE CHEST 1 VIEW COMPARISON:  03/19/2021 FINDINGS: Single frontal view of the chest demonstrates a stable cardiac silhouette. Stable ectasia and atherosclerosis of the thoracic aorta. No acute airspace disease, effusion, or pneumothorax. Chronic scarring at the left lung base. No acute bony abnormalities. IMPRESSION: 1. No acute intrathoracic process. Electronically Signed   By: Randa Ngo M.D.   On: 08/23/2021 14:59   DG Hip Unilat W or Wo Pelvis 2-3 Views Left  Result Date: 08/23/2021 CLINICAL DATA:  LEFT hip pain after falling. EXAM: DG HIP (WITH OR WITHOUT PELVIS) 2-3V LEFT COMPARISON:  03/07/2016 FINDINGS: Status post RIGHT total hip arthroplasty. 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 There is marked degenerative change of the LEFT hip, with near complete obliteration of the joint space and large osteophytes, similar to prior study. There is no acute fracture or subluxation. Degenerative changes are seen in the lumbar spine. IMPRESSION: No evidence for acute abnormality. Significant degenerative changes of the LEFT hip. Electronically Signed   By: Nolon Nations M.D.   On: 08/23/2021 14:25    Pertinent items noted in HPI and remainder of comprehensive ROS otherwise negative.  Blood pressure 140/77, pulse 86, temperature 98.1 F (36.7 C), temperature source Oral, resp. rate 15, height 5' 10.5" (1.791 m), weight 74.8 kg, SpO2 95 %.  Patient is awake and alert.  He is oriented and appropriate.  His speech is  fluent.  His judgment and insight are intact.  Cranial nerve function normal bilaterally except for some chronic left hemianopia.  Motor 5/5 bilaterally.  No pronator drift.  Chest and abdomen benign.  Extremities free from injury or deformity.  Patient with left buttocks/lateral hip tenderness.  Some bruising  present.  Cervical lumbar and thoracic spine free from significant tenderness. Assessment/Plan Patient is status post fall with small left posterior convexity subdural hematoma without significant mass-effect.  No evidence of intraparenchymal hemorrhage.  Plan ICU admission and observation.  Follow-up head CT in the morning.  Mobilize with therapy.  Mallie Mussel A Teffany Blaszczyk 08/23/2021, 8:01 PM

## 2021-08-23 NOTE — ED Provider Notes (Signed)
1418 contacted by radiology with report that patient has a 7 mm acute subdural hemorrhage.  No acute osseous abnormality of C-spine.  I spoke to charge nurse about this finding and the need to move patient back to next available bed.   Loni Beckwith, PA-C 08/23/21 1419    Valarie Merino, MD 08/24/21 2251

## 2021-08-23 NOTE — ED Provider Triage Note (Signed)
Emergency Medicine Provider Triage Evaluation Note  Brendan Long , a 88 y.o. male  was evaluated in triage.  Pt complains of neck pain, left hip pain, and hematuria after suffering a fall.  Patient states that he fell this morning at 0 800.  Patient states that he lost his balance and fell landing on his left side.  Patient endorses hitting his head but denies any loss of consciousness.  Patient states that he was on the ground for approximately 30 minutes before he can get himself off the ground.  Patient states that after his fall he urinated and noticed a blood clot in his urine.  Patient reports that he had pain to his left hip after his fall.  Patient has been able to ambulate with his cane since the fall.  Patient is not on any blood thinners.  Review of Systems  Positive: Neck pain, left hip pain, hematuria, urinary urgency Negative: Fever, chills, dysuria, back pain, numbness, weakness, headache, visual disturbance  Physical Exam  BP (!) 178/110 (BP Location: Left Arm)    Pulse 78    Temp 98.2 F (36.8 C) (Oral)    Resp 16    Ht 5' 10.5" (1.791 m)    Wt 74.8 kg    SpO2 99%    BMI 23.34 kg/m  Gen:   Awake, no distress   Resp:  Normal effort  MSK:   Moves extremities without difficulty; no tenderness, bony tenderness, or deformity to bilateral upper or lower extremities.  No midline tenderness or deformity to cervical, thoracic, or lumbar spine.  Patient has right-sided cervical paraspinous tenderness on exam.  Head is atraumatic.  Patient moves all limbs equally without difficulty. Other:    Medical Decision Making  Medically screening exam initiated at 1:42 PM.  Appropriate orders placed.  Julio Sicks was informed that the remainder of the evaluation will be completed by another provider, this initial triage assessment does not replace that evaluation, and the importance of remaining in the ED until their evaluation is complete.     Loni Beckwith, [place] 08/23/21  1344

## 2021-08-24 ENCOUNTER — Inpatient Hospital Stay (HOSPITAL_COMMUNITY): Payer: Medicare Other

## 2021-08-24 IMAGING — CT CT HEAD W/O CM
3 of 6 series · 13 of 47 positions shown, 15 images · non-contrast
Comparison: Prior CT from [DATE].

CLINICAL DATA: Follow-up examination for subdural hematoma.



[Series 5: head 5.0 h30s · axial · 0.45mm/px · z∈[-127,+23]mm · 8 of 36 slices shown, 10 images]
[im 3/36  brain]
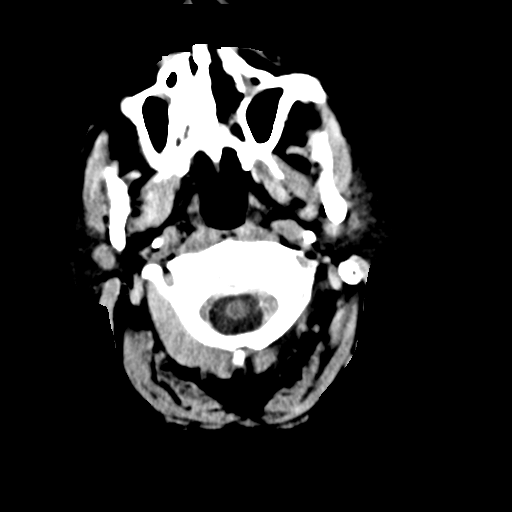
[im 3/36  bone]
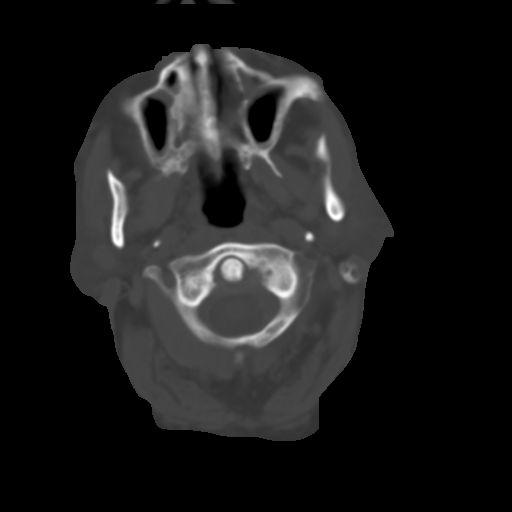
[im 8/36  brain]
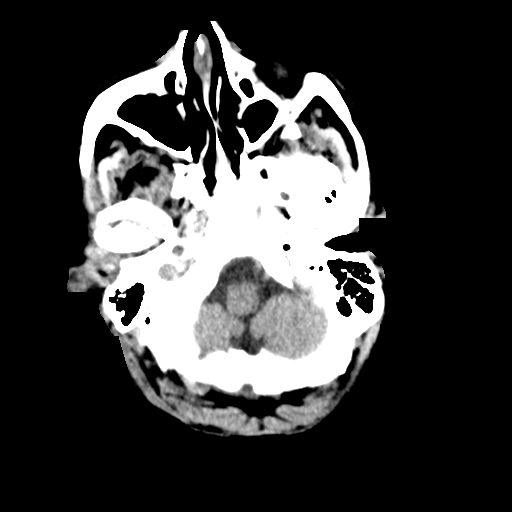
[im 13/36  brain]
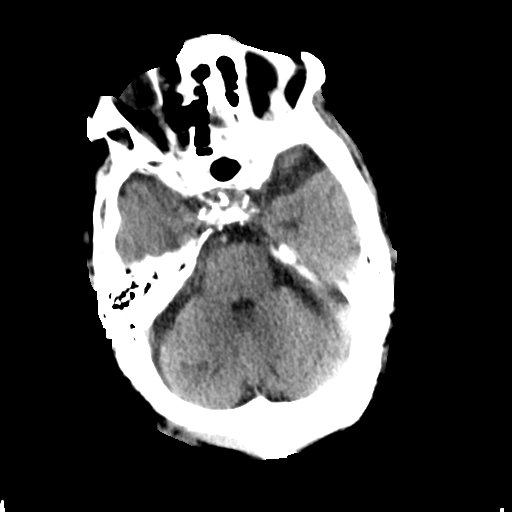
[im 16/36  brain]
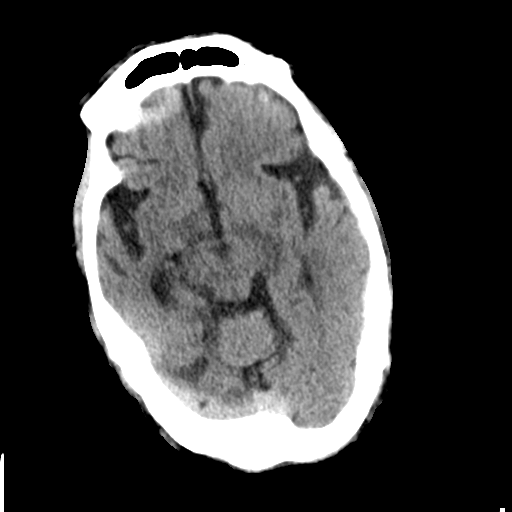
[im 21/36  brain]
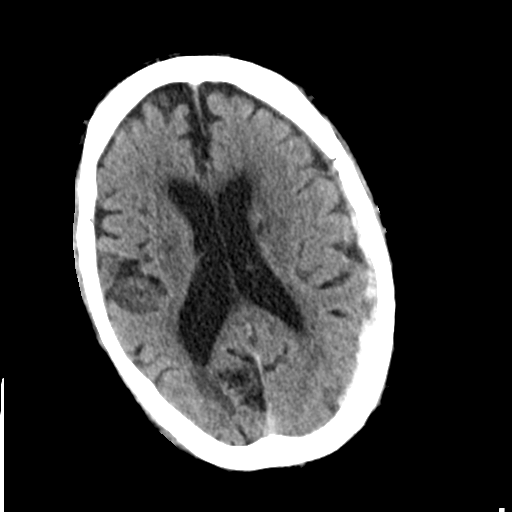
[im 21/36  bone]
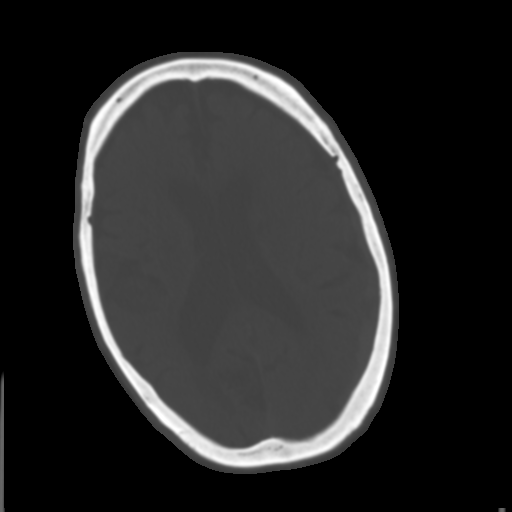
[im 23/36  brain]
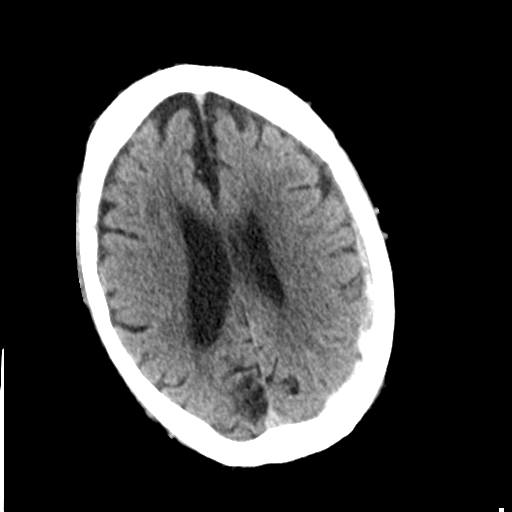
[im 28/36  brain]
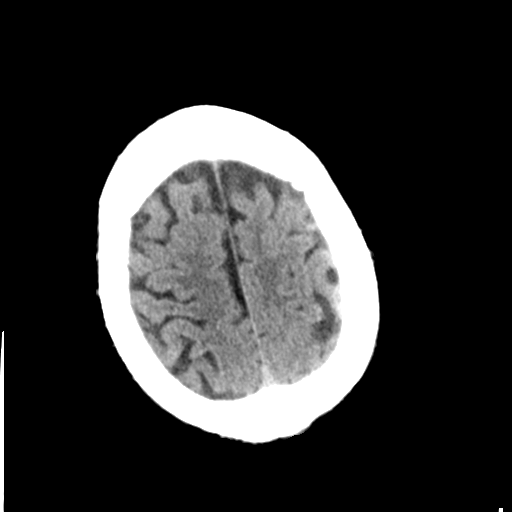
[im 33/36  brain]
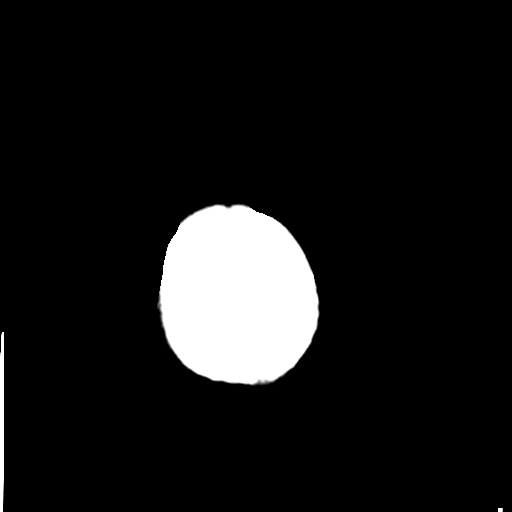

[Series 6: head 3.0 mpr cor · coronal · 0.35mm/px · 3 of 72 slices shown]
[im 18/72  brain]
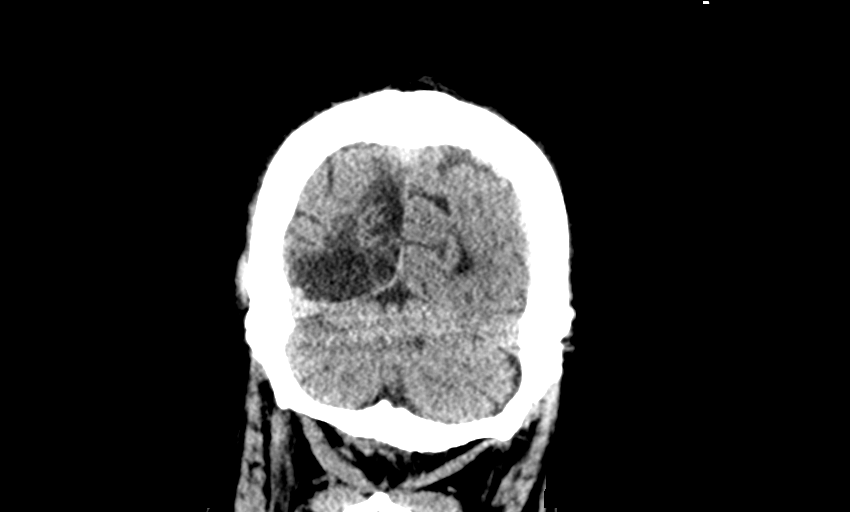
[im 36/72  brain]
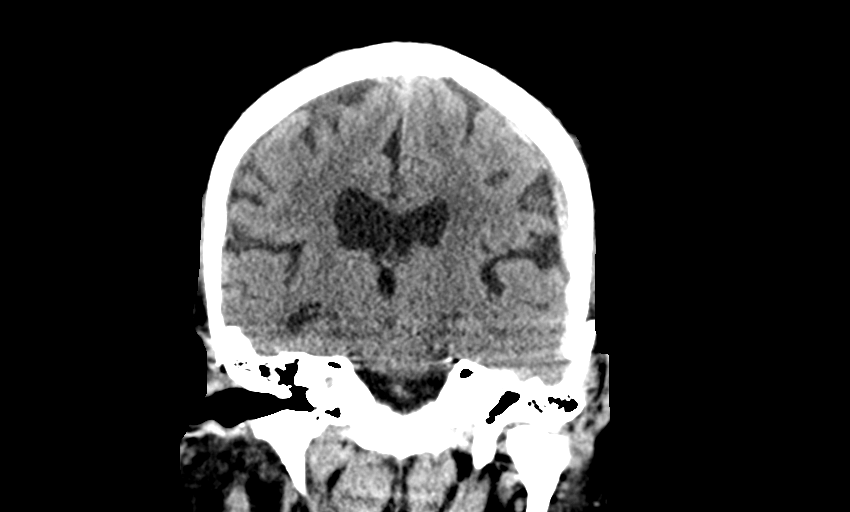
[im 54/72  brain]
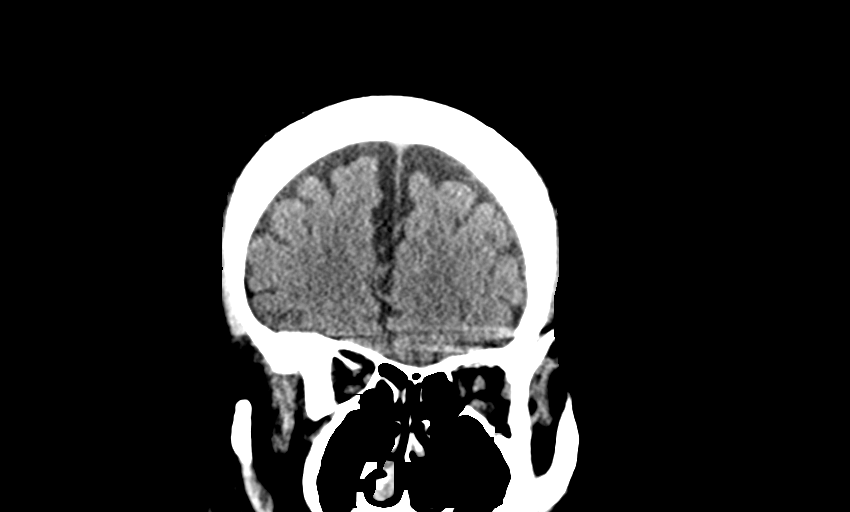

[Series 10: head 3.0 mpr sag · sagittal · 0.35mm/px · 2 of 67 slices shown]
[im 23/67  brain]
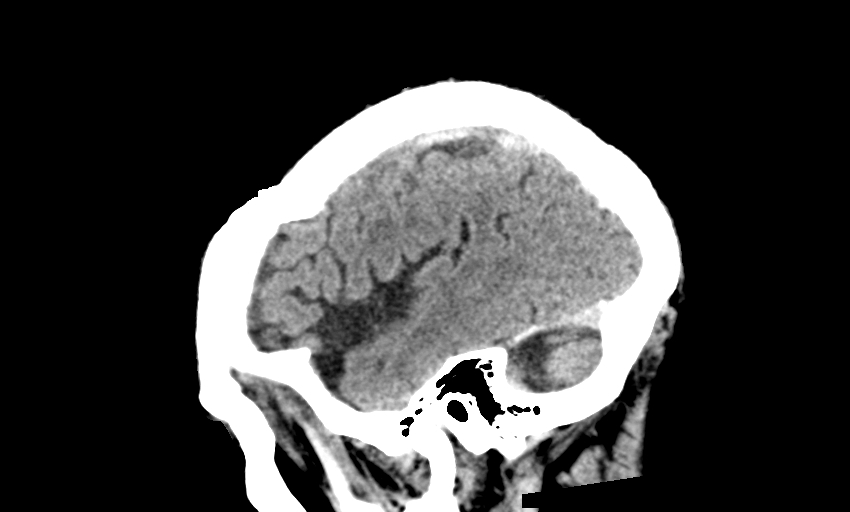
[im 45/67  brain]
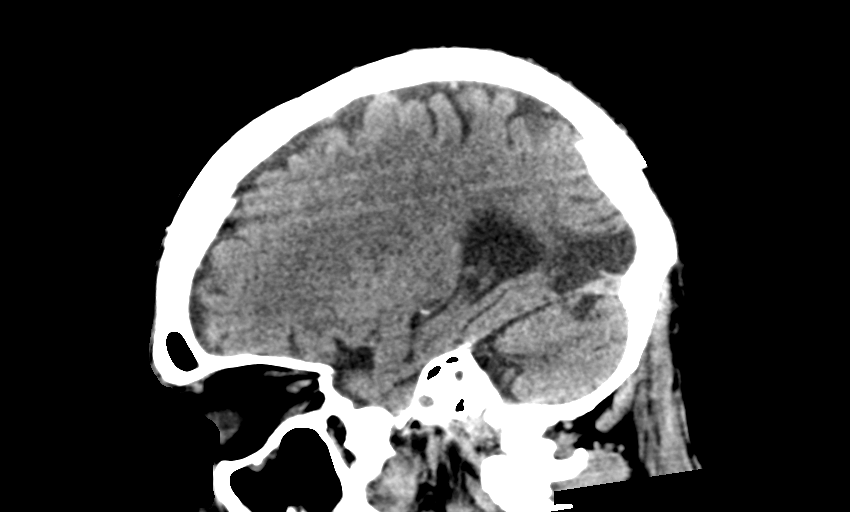

[13 of 47 positions shown; findings below may reference images not displayed]

FINDINGS: Brain: Previously identified left subdural hematoma again seen,
relatively unchanged measuring up to 7 mm in maximal thickness. No
more than mild mass effect on the subjacent left cerebral hemisphere
with no more than trace 2 mm left-to-right shift. No evidence for
significant interval bleeding.

No other new acute intracranial hemorrhage. No visible acute large
vessel territory infarct. Chronic right PCA and cerebellar infarcts
noted. Remote lacunar infarcts present about the bilateral basal
ganglia. Underlying atrophy with chronic small vessel ischemic
disease.

Vascular: No hyperdense vessel. Calcified atherosclerosis present at
the skull base.

Skull: Scalp soft tissues and calvarium demonstrate no new finding.

Sinuses/Orbits: Globes and orbital soft tissues demonstrate no acute
finding. Chronic mucosal thickening noted throughout the ethmoidal
air cells, sphenoid sinuses, and maxillary sinuses. Mastoid air
cells remain clear.

Other: None.
IMPRESSION: 1. No significant interval change in size of left subdural hematoma
measuring up to 7 mm in maximal thickness. Mild mass effect on the
subjacent left cerebral hemisphere with no more than trace 2 mm
left-to-right shift.
2. No other new acute intracranial abnormality.

## 2021-08-24 IMAGING — CT CT ABD-PELV W/O CM
2 of 4 series · 16 of 46 positions shown, 18 images · non-contrast
Comparison: None.

CLINICAL DATA: Gross hematuria, hip pain



[Series 3: ap without (person_name) · axial · non-contrast · 0.80mm/px · z∈[+996,+1356]mm · 13 of 82 slices shown, 15 images]
[im 5/82  soft-tissue]
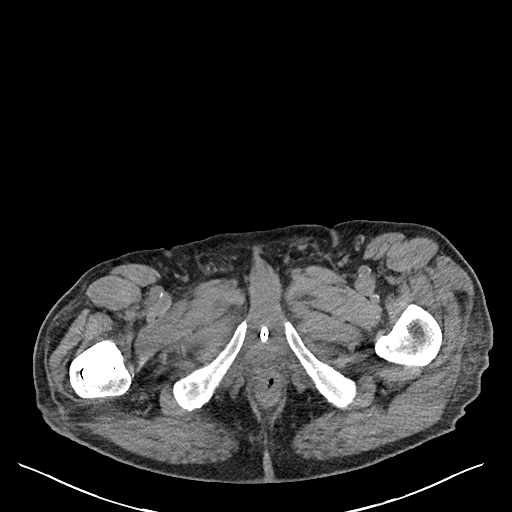
[im 5/82  bone]
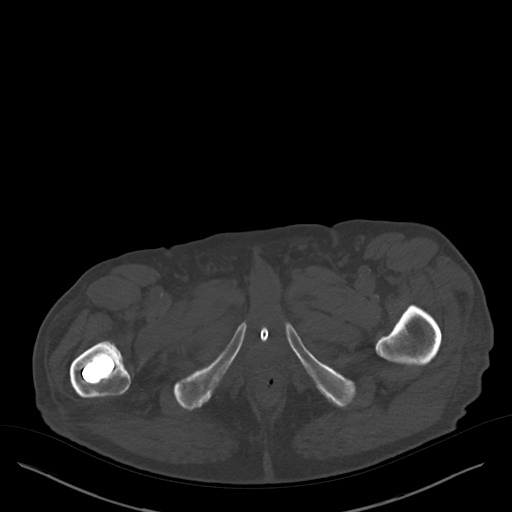
[im 13/82  soft-tissue]
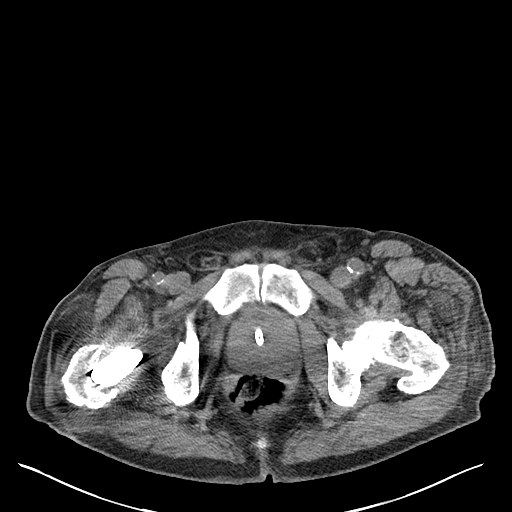
[im 17/82  soft-tissue]
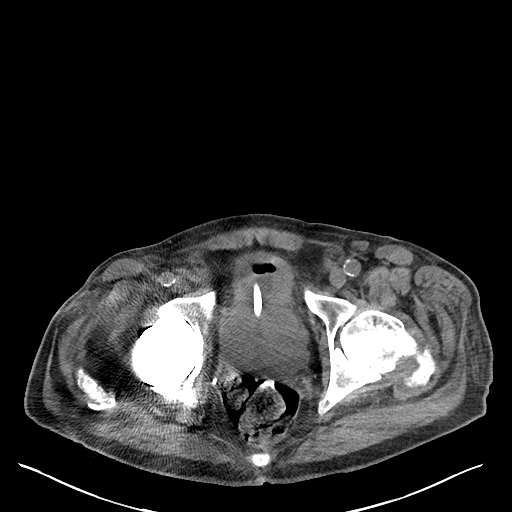
[im 25/82  soft-tissue]
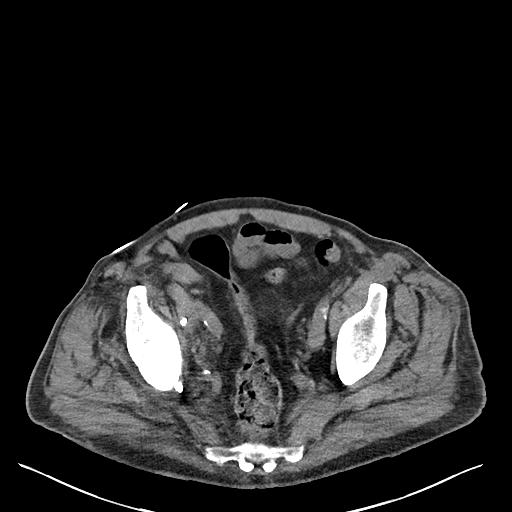
[im 29/82  soft-tissue]
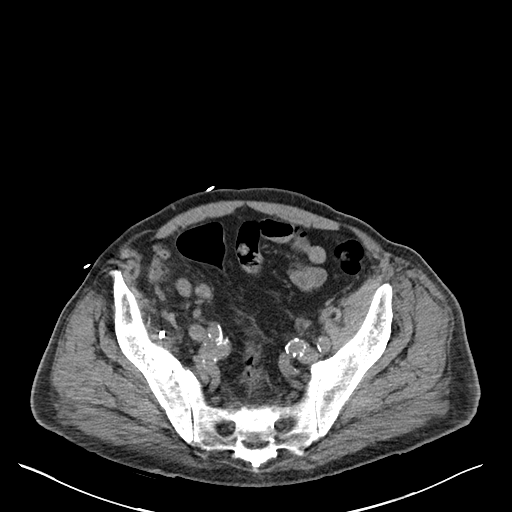
[im 37/82  soft-tissue]
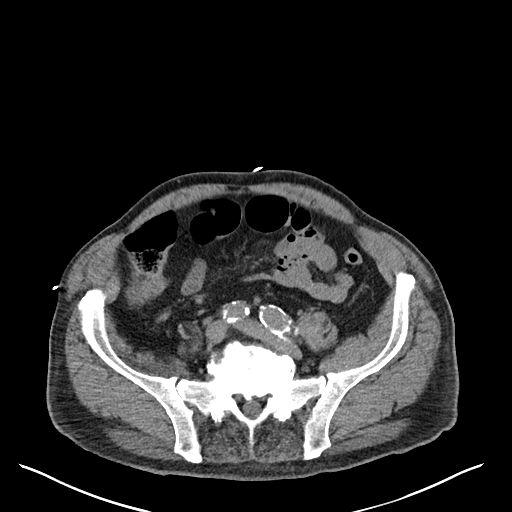
[im 41/82  soft-tissue]
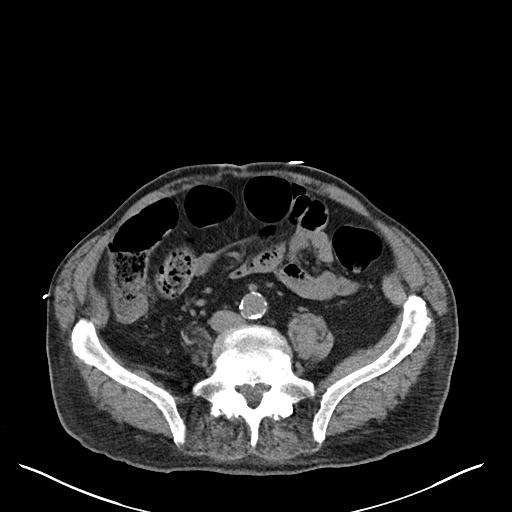
[im 45/82  soft-tissue]
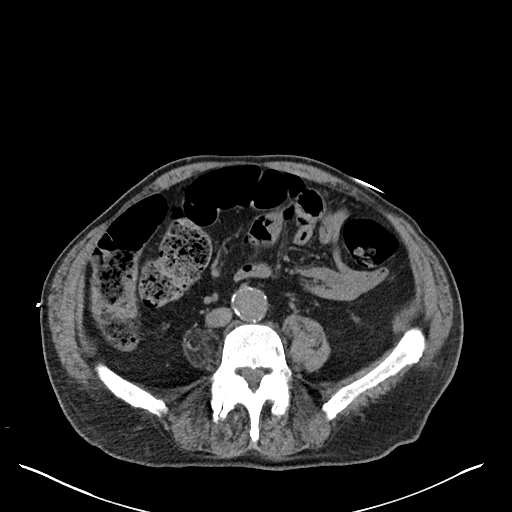
[im 53/82  soft-tissue]
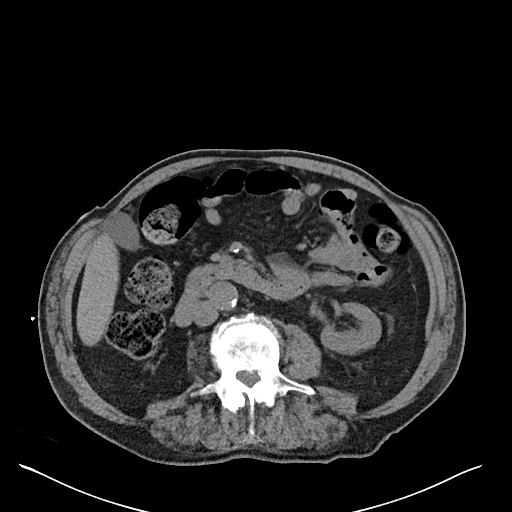
[im 53/82  bone]
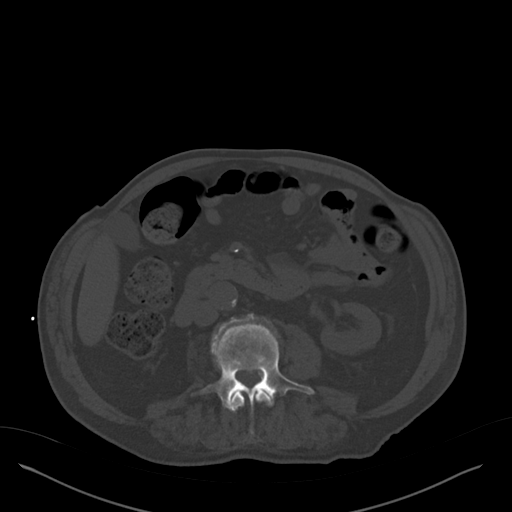
[im 57/82  soft-tissue]
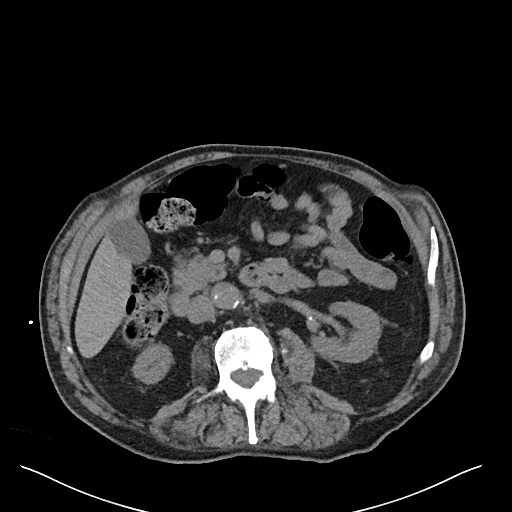
[im 65/82  soft-tissue]
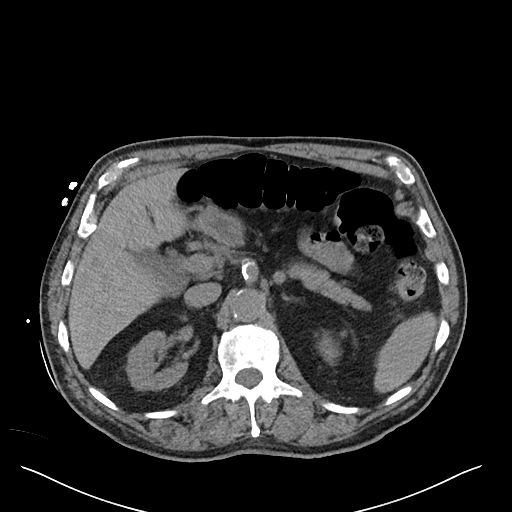
[im 69/82  soft-tissue]
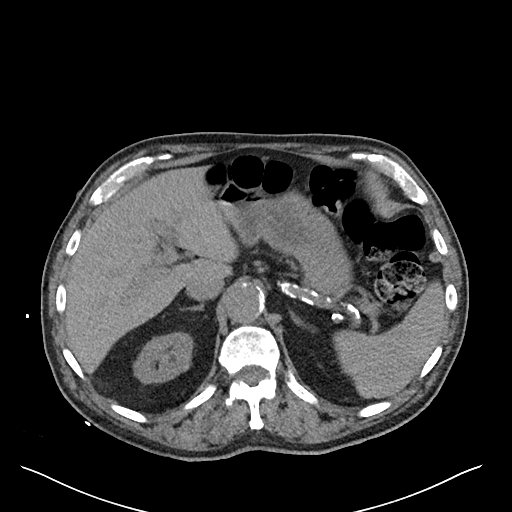
[im 77/82  soft-tissue]
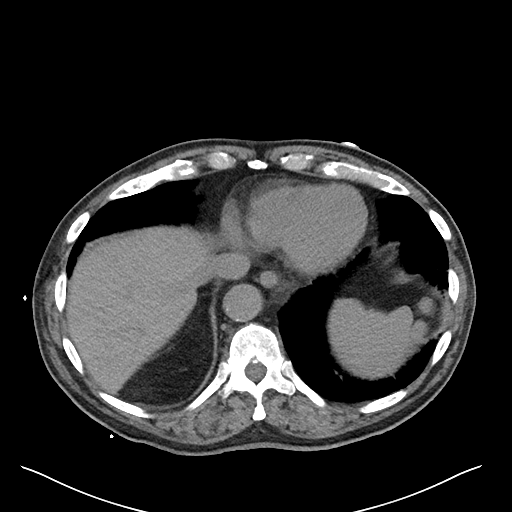

[Series 6: (person_name) · coronal · 0.73mm/px · 3 of 93 slices shown]
[im 31/93  soft-tissue]
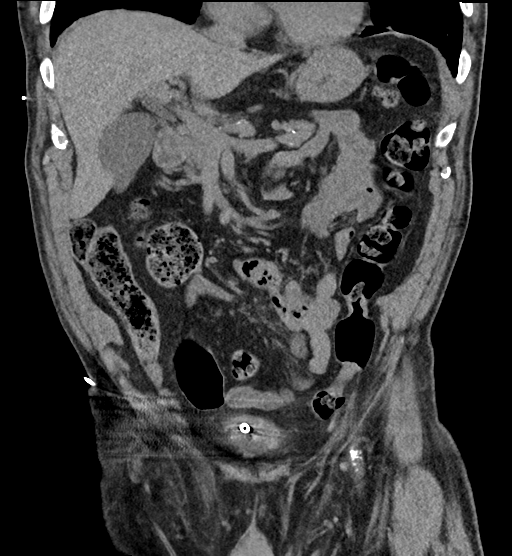
[im 41/93  soft-tissue]
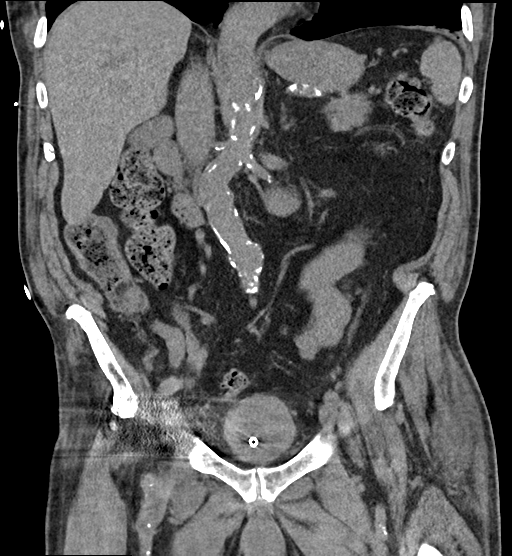
[im 52/93  soft-tissue]
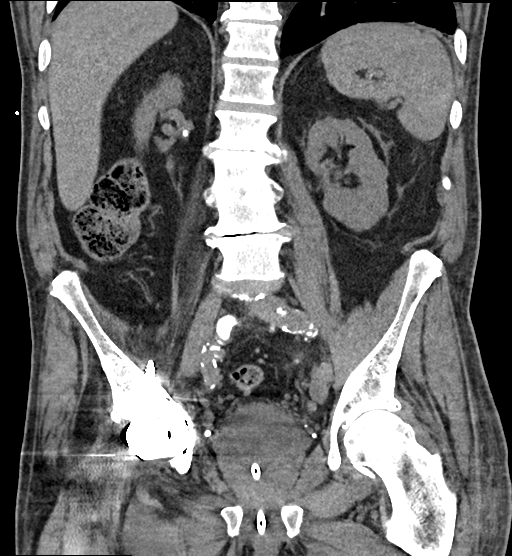

[16 of 46 positions shown; findings below may reference images not displayed]

FINDINGS: Lower chest: No acute abnormality.  Coronary artery calcifications.

Hepatobiliary: No solid liver abnormality is seen. No gallstones,
gallbladder wall thickening, or biliary dilatation.

Pancreas: Unremarkable. No pancreatic ductal dilatation or
surrounding inflammatory changes.

Spleen: Normal in size without significant abnormality.

Adrenals/Urinary Tract: Adrenal glands are unremarkable. Kidneys are
normal, without renal calculi, solid lesion, or hydronephrosis.
Foley catheter within the urinary bladder. Wall thickening of the
urinary bladder, with hyperdense internal contents. Evaluation is
somewhat limited by metallic streak artifact from adjacent hip
arthroplasty

Stomach/Bowel: Stomach is within normal limits. Appendix appears
normal. No evidence of bowel wall thickening, distention, or
inflammatory changes.

Vascular/Lymphatic: Aortic atherosclerosis. No enlarged abdominal or
pelvic lymph nodes.

Reproductive: Severe prostatomegaly.

Other: Small, fat containing bilateral inguinal hernias. No ascites.

Musculoskeletal: No acute or significant osseous findings. Status
post right hip total arthroplasty.
IMPRESSION: 1. No urinary tract calculus or hydronephrosis.
2. Wall thickening of the urinary bladder, with hyperdense internal
contents, suggestive of blood products. Evaluation is somewhat
limited by metallic streak artifact from adjacent hip arthroplasty.
3. Severe prostatomegaly with Foley catheter.
4. Coronary artery disease.

Aortic Atherosclerosis ([2G]-[2G]).

## 2021-08-24 MED ORDER — TAMSULOSIN HCL 0.4 MG PO CAPS
0.4000 mg | ORAL_CAPSULE | Freq: Every day | ORAL | Status: DC
  Administered 2021-08-24 – 2021-08-31 (×8): 0.4 mg via ORAL
  Filled 2021-08-24 (×8): qty 1

## 2021-08-24 MED ORDER — CHLORHEXIDINE GLUCONATE CLOTH 2 % EX PADS
6.0000 | MEDICATED_PAD | Freq: Every day | CUTANEOUS | Status: DC
  Administered 2021-08-25 – 2021-08-29 (×5): 6 via TOPICAL

## 2021-08-24 MED ORDER — HYDRALAZINE HCL 20 MG/ML IJ SOLN
20.0000 mg | INTRAMUSCULAR | Status: DC | PRN
  Administered 2021-08-24 – 2021-08-31 (×4): 20 mg via INTRAVENOUS
  Filled 2021-08-24 (×4): qty 1

## 2021-08-24 MED ORDER — LIDOCAINE HCL URETHRAL/MUCOSAL 2 % EX GEL
1.0000 "application " | Freq: Once | CUTANEOUS | Status: AC
  Administered 2021-08-24: 1 via URETHRAL
  Filled 2021-08-24: qty 6

## 2021-08-24 NOTE — Progress Notes (Signed)
Pt has not voided, bladder scan 426, attempted to I&O cath but pt began bleeding and the cath would not go past the prostate, pt then passed 2 blood clots. Arnetha Massy aware, coude and urojet were attempted but unable to pass the prostate and more blood clots. Urology consult placed.

## 2021-08-24 NOTE — Discharge Instructions (Signed)

## 2021-08-24 NOTE — H&P (Signed)
Chief Complaint: Status post fall HPI: 88 year old male status post fall from standing height.  Fall mechanical in nature not associated with syncope.  Patient with immediate onset of left hip pain/buttock pain and some headache.  No loss of consciousness.  No symptoms of numbness paresthesias or weakness.  Prior history of old stroke.  Patient not currently anticoagulated.  Past Medical History:  Diagnosis Date   ED (erectile dysfunction)    Hyperlipidemia    Hypertension    Hypothyroidism    Psoriasis    Stroke ([place])    Varicose veins    Vitamin D deficiency     Past Surgical History:  Procedure Laterality Date   JOINT REPLACEMENT     joint replacement rt and left     rotator cuff repair, right shoulder per Dr. Tonita Cong  1996   torn right bicepts muscle  [place]  01/17/10   redone on right hip on 09/05/10    Family History  Problem Relation Age of Onset   Breast cancer Other    Social History:  reports that he has quit smoking. He has never used smokeless tobacco. He reports that he does not drink alcohol and does not use drugs.  Allergies: No Known Allergies  Medications Prior to Admission  Medication Sig Dispense Refill   Acetaminophen-Codeine 300-30 MG tablet Take 1 tablet by mouth 3 (three) times daily as needed for pain.     apixaban (ELIQUIS) 5 MG TABS tablet Take 1 tablet (5 mg total) by mouth 2 (two) times daily. (Patient not taking: No sig reported) 60 tablet 0   atorvastatin (LIPITOR) 80 MG tablet TAKE 1 TABLET DAILY (Patient taking differently: Take 80 mg by mouth daily.) 90 tablet 2   budesonide-formoterol (SYMBICORT) 80-4.5 MCG/ACT inhaler Inhale 2 puffs into the lungs 2 (two) times daily. (Patient not taking: No sig reported) 1 Inhaler 3   clobetasol ointment (TEMOVATE) 0.05 % APPLY TOPICALLY TWICE A DAY (Patient not taking: No sig reported) 30 g 23   diclofenac sodium (VOLTAREN) 1 % GEL APPLY 4 GRAMS TOPICALLY FOUR TIMES A DAY AS NEEDED FOR  KNEE PAIN (Patient taking differently: Apply 4 g topically 4 (four) times daily as needed (pain).) 200 g 28   Ibuprofen (ADVIL PO) Take 1 tablet by mouth daily as needed (pain).     ketoconazole (NIZORAL) 2 % cream Apply 1 application topically 2 (two) times daily as needed for irritation. 30 g 0   metoprolol tartrate (LOPRESSOR) 50 MG tablet Take 50 mg by mouth 2 (two) times daily.     omega-3 acid ethyl esters (LOVAZA) 1 G capsule Take 2 capsules (2 g total) by mouth 2 (two) times daily. (Patient not taking: Reported on 03/20/2021) 120 capsule 11   polyethylene glycol (MIRALAX / GLYCOLAX) 17 g packet Take 17 g by mouth daily as needed for severe constipation. (Patient not taking: No sig reported) 14 each 0   prochlorperazine (COMPAZINE) 10 MG tablet Take 10 mg by mouth every 6 (six) hours as needed for nausea or vomiting.     sildenafil (VIAGRA) 100 MG tablet TAKE 1 TABLET BY MOUTH 1 HOUR BEFORE NEEDED FOR ERECTILE DYSFUNCTION (Patient not taking: No sig reported) 10 tablet 2   TRICOR 48 MG tablet TAKE 1 TABLET DAILY (Patient taking differently: Take 48 mg by mouth daily.) 90 tablet 3   Vitamin D, Ergocalciferol, (DRISDOL) 1.25 MG (50000 UNIT) CAPS capsule TAKE 1 CAPSULE EVERY 7 DAYS FOR 12 DOSES (Patient  not taking: No sig reported) 12 capsule 0    Results for orders placed or performed during the hospital encounter of 08/23/21 (from the past 48 hour(s))  Urinalysis, Routine w reflex microscopic Urine, Clean Catch     Status: Abnormal   Collection Time: 08/23/21  2:47 PM  Result Value Ref Range   Color, Urine AMBER (A) YELLOW    Comment: BIOCHEMICALS MAY BE AFFECTED BY COLOR   APPearance CLOUDY (A) CLEAR   Specific Gravity, Urine 1.018 1.005 - 1.030   pH 5.0 5.0 - 8.0   Glucose, UA NEGATIVE NEGATIVE mg/dL   Hgb urine dipstick LARGE (A) NEGATIVE   Bilirubin Urine NEGATIVE NEGATIVE   Ketones, ur 5 (A) NEGATIVE mg/dL   Protein, ur 100 (A) NEGATIVE mg/dL   Nitrite NEGATIVE NEGATIVE    Leukocytes,Ua TRACE (A) NEGATIVE   RBC / HPF >50 (H) 0 - 5 RBC/hpf   WBC, UA 11-20 0 - 5 WBC/hpf   Bacteria, UA NONE SEEN NONE SEEN   Squamous Epithelial / LPF 0-5 0 - 5   Mucus PRESENT    Hyaline Casts, UA PRESENT     Comment: Performed at [facility], [place] [address] [place], [place] [address]  Comprehensive metabolic panel     Status: Abnormal   Collection Time: 08/23/21  2:47 PM  Result Value Ref Range   Sodium 134 (L) 135 - 145 mmol/L   Potassium 3.7 3.5 - 5.1 mmol/L   Chloride 103 98 - 111 mmol/L   CO2 25 22 - 32 mmol/L   Glucose, Bld 87 70 - 99 mg/dL    Comment: Glucose reference range applies only to samples taken after fasting for at least 8 hours.   BUN 30 (H) 8 - 23 mg/dL   Creatinine, Ser 1.80 (H) 0.61 - 1.24 mg/dL   Calcium 9.0 8.9 - 10.3 mg/dL   Total Protein 6.9 6.5 - 8.1 g/dL   Albumin 3.9 3.5 - 5.0 g/dL   AST 23 15 - 41 U/L   ALT 15 0 - 44 U/L   Alkaline Phosphatase 69 38 - 126 U/L   Total Bilirubin 2.2 (H) 0.3 - 1.2 mg/dL   GFR, Estimated 35 (L) >60 mL/min    Comment: (NOTE) Calculated using the CKD-EPI Creatinine Equation (2021)    Anion gap 6 5 - 15    Comment: Performed at [facility], [place] [address], [place], [place] [address]  CBC with Differential     Status: Abnormal   Collection Time: 08/23/21  2:47 PM  Result Value Ref Range   WBC 10.8 (H) 4.0 - 10.5 K/uL   RBC 4.93 4.22 - 5.81 MIL/uL   Hemoglobin 15.2 13.0 - 17.0 g/dL   HCT 46.2 39.0 - 52.0 %   MCV 93.7 80.0 - 100.0 fL   MCH 30.8 26.0 - 34.0 pg   MCHC 32.9 30.0 - 36.0 g/dL   RDW 12.9 11.5 - 15.5 %   Platelets 196 150 - 400 K/uL   nRBC 0.0 0.0 - 0.2 %   Neutrophils Relative % 67 %   Neutro Abs 7.4 1.7 - 7.7 K/uL   Lymphocytes Relative 21 %   Lymphs Abs 2.3 0.7 - 4.0 K/uL   Monocytes Relative 6 %   Monocytes Absolute 0.6 0.1 - 1.0 K/uL   Eosinophils Relative 5 %   Eosinophils Absolute 0.5 0.0 - 0.5 K/uL   Basophils Relative 1 %   Basophils Absolute  0.1 0.0 - 0.1  K/uL   Immature Granulocytes 0 %   Abs Immature Granulocytes 0.03 0.00 - 0.07 K/uL    Comment: Performed at [facility], [place] [address], [place], [place] [address]  Protime-INR     Status: None   Collection Time: 08/23/21  2:47 PM  Result Value Ref Range   Prothrombin Time 13.2 11.4 - 15.2 seconds   INR 1.0 0.8 - 1.2    Comment: (NOTE) INR goal varies based on device and disease states. Performed at [facility], [place] [address] [place], [place] [address]   Resp Panel by RT-PCR (Flu A&B, Covid) Nasopharyngeal Swab     Status: None   Collection Time: 08/23/21  2:47 PM   Specimen: Nasopharyngeal Swab; Nasopharyngeal(NP) swabs in vial transport medium  Result Value Ref Range   SARS Coronavirus 2 by RT PCR NEGATIVE NEGATIVE    Comment: (NOTE) SARS-CoV-2 target nucleic acids are NOT DETECTED.  The SARS-CoV-2 RNA is generally detectable in upper respiratory specimens during the acute phase of infection. The lowest concentration of SARS-CoV-2 viral copies this assay can detect is 138 copies/mL. A negative result does not preclude SARS-Cov-2 infection and should not be used as the sole basis for treatment or other patient management decisions. A negative result may occur with  improper specimen collection/handling, submission of specimen other than nasopharyngeal swab, presence of viral mutation(s) within the areas targeted by this assay, and inadequate number of viral copies(<138 copies/mL). A negative result must be combined with clinical observations, patient history, and epidemiological information. The expected result is Negative.  Fact Sheet for Patients:  EntrepreneurPulse.com.au  Fact Sheet for Healthcare Providers:  IncredibleEmployment.be  This test is no t yet approved or cleared by the [place] FDA and  has been authorized for detection and/or diagnosis of SARS-CoV-2 by FDA  under an Emergency Use Authorization (EUA). This EUA will remain  in effect (meaning this test can be used) for the duration of the COVID-19 declaration under Section 564(b)(1) of the Act, 21 U.S.C.section 360bbb-3(b)(1), unless the authorization is terminated  or revoked sooner.       Influenza A by PCR NEGATIVE NEGATIVE   Influenza B by PCR NEGATIVE NEGATIVE    Comment: (NOTE) The Xpert Xpress SARS-CoV-2/FLU/RSV plus assay is intended as an aid in the diagnosis of influenza from Nasopharyngeal swab specimens and should not be used as a sole basis for treatment. Nasal washings and aspirates are unacceptable for Xpert Xpress SARS-CoV-2/FLU/RSV testing.  Fact Sheet for Patients: EntrepreneurPulse.com.au  Fact Sheet for Healthcare Providers: IncredibleEmployment.be  This test is not yet approved or cleared by the [place] FDA and has been authorized for detection and/or diagnosis of SARS-CoV-2 by FDA under an Emergency Use Authorization (EUA). This EUA will remain in effect (meaning this test can be used) for the duration of the COVID-19 declaration under Section 564(b)(1) of the Act, 21 U.S.C. section 360bbb-3(b)(1), unless the authorization is terminated or revoked.  Performed at [facility], [place] [address], [place], [place] [address]    CT HEAD WO CONTRAST (5MM)  Result Date: 08/23/2021 CLINICAL DATA:  Neck pain, left hip pain status post fall this morning. EXAM: CT HEAD WITHOUT CONTRAST CT CERVICAL SPINE WITHOUT CONTRAST TECHNIQUE: Multidetector CT imaging of the head and cervical spine was performed following the standard protocol without intravenous contrast. Multiplanar CT image reconstructions of the cervical spine were also generated. RADIATION DOSE REDUCTION: This exam was performed according to the departmental dose-optimization program which includes  automated exposure control, adjustment of the mA and/or kV  according to patient size and/or use of iterative reconstruction technique. COMPARISON:  None. FINDINGS: Brain: Acute hemorrhage along the left cerebral convexity along the parietal lobe measuring 7 mm in thickness. 1-2 mm left-to-right midline shift. No evidence of acute infarction, ventriculomegaly, or mass effect. 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 generalized cerebral atrophy. Periventricular white matter low attenuation likely secondary to microangiopathy. Vascular: Cerebrovascular atherosclerotic calcifications are noted. No hyperdense vessels. Skull: Negative for fracture or focal lesion. Sinuses/Orbits: Visualized portions of the orbits are unremarkable. Visualized portions of the paranasal sinuses are unremarkable. Visualized portions of the mastoid air cells are unremarkable. Other: None. CT CERVICAL SPINE FINDINGS Alignment: Normal. Skull base and vertebrae: No acute fracture. No primary bone lesion or focal pathologic process. Soft tissues and spinal canal: No prevertebral fluid or swelling. No visible canal hematoma. Disc levels: Degenerative disease with disc height loss at C3-4, C4-5, C5-6, C6-7, C7-T1 and T1-2 with mild bilateral facet arthropathy. Bilateral uncovertebral degenerative changes at C3-4 and C4-5 with moderate-severe foraminal stenosis. Bilateral uncovertebral degenerative changes at C5-6 with mild foraminal stenosis. Upper chest: Lung apices are clear. Other: No fluid collection or hematoma. IMPRESSION: 1. Acute subdural hemorrhage along the left parietal convexity measuring 7 mm in thickness. 1-2 mm left right midline shift. 2.  No acute osseous injury of the cervical spine. Critical Value/emergent results were called by telephone at the time of interpretation on 08/23/2021 at 2:17 pm to provider [facility] , who verbally acknowledged these results. Electronically Signed   By: Kathreen Devoid M.D.   On: 08/23/2021 14:19   CT Cervical Spine Wo Contrast  Result  Date: 08/23/2021 CLINICAL DATA:  Neck pain, left hip pain status post fall this morning. EXAM: CT HEAD WITHOUT CONTRAST CT CERVICAL SPINE WITHOUT CONTRAST TECHNIQUE: Multidetector CT imaging of the head and cervical spine was performed following the standard protocol without intravenous contrast. Multiplanar CT image reconstructions of the cervical spine were also generated. RADIATION DOSE REDUCTION: This exam was performed according to the departmental dose-optimization program which includes automated exposure control, adjustment of the mA and/or kV according to patient size and/or use of iterative reconstruction technique. COMPARISON:  None. FINDINGS: Brain: Acute hemorrhage along the left cerebral convexity along the parietal lobe measuring 7 mm in thickness. 1-2 mm left-to-right midline shift. No evidence of acute infarction, ventriculomegaly, or mass effect. 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 generalized cerebral atrophy. Periventricular white matter low attenuation likely secondary to microangiopathy. Vascular: Cerebrovascular atherosclerotic calcifications are noted. No hyperdense vessels. Skull: Negative for fracture or focal lesion. Sinuses/Orbits: Visualized portions of the orbits are unremarkable. Visualized portions of the paranasal sinuses are unremarkable. Visualized portions of the mastoid air cells are unremarkable. Other: None. CT CERVICAL SPINE FINDINGS Alignment: Normal. Skull base and vertebrae: No acute fracture. No primary bone lesion or focal pathologic process. Soft tissues and spinal canal: No prevertebral fluid or swelling. No visible canal hematoma. Disc levels: Degenerative disease with disc height loss at C3-4, C4-5, C5-6, C6-7, C7-T1 and T1-2 with mild bilateral facet arthropathy. Bilateral uncovertebral degenerative changes at C3-4 and C4-5 with moderate-severe foraminal stenosis. Bilateral uncovertebral degenerative changes at C5-6 with mild foraminal stenosis.  Upper chest: Lung apices are clear. Other: No fluid collection or hematoma. IMPRESSION: 1. Acute subdural hemorrhage along the left parietal convexity measuring 7 mm in thickness. 1-2 mm left right midline shift. 2.  No acute osseous injury of the cervical spine. Critical Value/emergent results  were called by telephone at the time of interpretation on 08/23/2021 at 2:17 pm to provider [facility] , who verbally acknowledged these results. Electronically Signed   By: Kathreen Devoid M.D.   On: 08/23/2021 14:19   CT Hip Left Wo Contrast  Result Date: 08/23/2021 CLINICAL DATA:  Hip trauma.  Fracture suspected. EXAM: CT OF THE LEFT HIP WITHOUT CONTRAST TECHNIQUE: Multidetector CT imaging of the left hip was performed according to the standard protocol. Multiplanar CT image reconstructions were also generated. RADIATION DOSE REDUCTION: This exam was performed according to the departmental dose-optimization program which includes automated exposure control, adjustment of the mA and/or kV according to patient size and/or use of iterative reconstruction technique. COMPARISON:  Pelvis and left hip radiographs 08/23/2021 FINDINGS: Bones/Joint/Cartilage Severe left femoroacetabular osteoarthritis, greatest within the posterosuperior aspect. Moderate subchondral sclerosis and subchondral degenerative cystic change. Moderate to high-grade left femoral head-neck junction and moderate peripheral left acetabular degenerative osteophytosis. Bridging degenerative osteophytes within the visualized portion of the inferior left sacroiliac joint. More mild endplate osteophytes of the right sacroiliac joint. Streak artifact from total right hip arthroplasty hardware. No perihardware lucency is seen to indicate hardware failure or loosening. No acute fracture is seen. Ligaments Suboptimally assessed by CT. Muscles and Tendons Grossly unremarkable. Soft tissues No left hip joint effusion. Moderate vascular calcifications are noted.  Mild-to-moderate fat containing left inguinal hernia. IMPRESSION:: IMPRESSION: 1. No left hip fracture is seen. 2. Severe left femoroacetabular osteoarthritis. 3. Status post total right hip arthroplasty without evidence of hardware failure. Electronically Signed   By: Yvonne Kendall M.D.   On: 08/23/2021 16:01   DG Chest Port 1 View  Result Date: 08/23/2021 CLINICAL DATA:  Neck and left hip pain, hematuria, fell EXAM: PORTABLE CHEST 1 VIEW COMPARISON:  03/19/2021 FINDINGS: Single frontal view of the chest demonstrates a stable cardiac silhouette. Stable ectasia and atherosclerosis of the thoracic aorta. No acute airspace disease, effusion, or pneumothorax. Chronic scarring at the left lung base. No acute bony abnormalities. IMPRESSION: 1. No acute intrathoracic process. Electronically Signed   By: Randa Ngo M.D.   On: 08/23/2021 14:59   DG Hip Unilat W or Wo Pelvis 2-3 Views Left  Result Date: 08/23/2021 CLINICAL DATA:  LEFT hip pain after falling. EXAM: DG HIP (WITH OR WITHOUT PELVIS) 2-3V LEFT COMPARISON:  03/07/2016 FINDINGS: Status post RIGHT total hip arthroplasty. 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 There is marked degenerative change of the LEFT hip, with near complete obliteration of the joint space and large osteophytes, similar to prior study. There is no acute fracture or subluxation. Degenerative changes are seen in the lumbar spine. IMPRESSION: No evidence for acute abnormality. Significant degenerative changes of the LEFT hip. Electronically Signed   By: Nolon Nations M.D.   On: 08/23/2021 14:25    Pertinent items noted in HPI and remainder of comprehensive ROS otherwise negative.  Blood pressure 140/77, pulse 86, temperature 98.1 F (36.7 C), temperature source Oral, resp. rate 15, height 5' 10.5" (1.791 m), weight 74.8 kg, SpO2 95 %.  Patient is awake and alert.  He is oriented and appropriate.  His speech is fluent.  His judgment and insight are intact.  Cranial nerve  function normal bilaterally except for some chronic left hemianopia.  Motor 5/5 bilaterally.  No pronator drift.  Chest and abdomen benign.  Extremities free from injury or deformity.  Patient with left buttocks/lateral hip tenderness.  Some bruising present.  Cervical lumbar and thoracic spine free from significant  tenderness. Assessment/Plan Patient is status post fall with small left posterior convexity subdural hematoma without significant mass-effect.  No evidence of intraparenchymal hemorrhage.  Plan ICU admission and observation.  Follow-up head CT in the morning.  Mobilize with therapy.  Mallie Mussel A Caroline Matters 08/23/2021, 8:01 PM

## 2021-08-24 NOTE — Consult Note (Signed)
I have been asked to see the patient by Dr. Earnie Larsson, for evaluation and management of difficult foley.  History of present illness: 88 year old male with a history of BPH and prostamegaly presented to the emergency department yesterday afternoon after mechanical fall from standing.  Work-up demonstrated a small subdural hematoma.  He was admitted to the neuro ICU for observation.  The patient states that in the emergency department he was passing some blood per urethra, but was really unable to void any substantial amount.  In the ICU several attempts were made by nursing staff to perform an in and out catheterization because of his elevated PVR unsuccessfully.  The patient states the baseline he has urinary frequency and urgency.  He also has significant nocturia getting up 3-4 times a night.  His stream is weak.  He does not take any medication to help with his urinary tract symptoms.  He has been seen in our office, last in 2016, at which point he had an ultrasound of his prostate that demonstrated 101 g prostate, 2.5 times normal.  He has no history of urinary retention or recurrent urinary tract infections.  He does have chronic renal failure.  Patient's past medical history is significant for A. fib and stroke.  In 2021 he was started on Eliquis.   Review of systems: A 12 point comprehensive review of systems was obtained and is negative unless otherwise stated in the history of present illness.  Patient Active Problem List   Diagnosis Date Noted   SDH (subdural hematoma) 08/23/2021   Acute kidney injury superimposed on CKD ([place]) 03/19/2021   Syncope 03/19/2021   Confusion 03/19/2021   History of CVA (cerebrovascular accident) 05/07/2020   AF (paroxysmal atrial fibrillation) ([place]) 05/07/2020   DM (diabetes mellitus), type 2 with renal complications ([place]) 23/76/2831   AKI (acute kidney injury) ([place]) 05/07/2020   CVA (cerebral vascular accident) ([place]) 05/07/2020   Abnormal  echocardiogram 05/07/2020   CKD (chronic kidney disease) stage 3, GFR 30-59 ml/min ([place]) 05/07/2020   Acute CVA (cerebrovascular accident) ([place])    Stroke ([place]) 05/06/2020   IGT (impaired glucose tolerance) 12/09/2019   Dizziness 03/18/2019   Neck swelling 07/26/2018   Elevated PSA 12/16/2014   Hypokalemia 12/16/2014   Hyperglycemia 12/15/2013   Renal insufficiency 12/15/2013   Dyspnea 12/15/2013   Hand pain, right 12/15/2013   Screening for cancer 12/05/2012   Encounter for long-term (current) use of other medications 12/05/2011   Wellness examination 12/05/2011   Osteoarthritis 12/05/2011   Cough 12/05/2011   ERECTILE DYSFUNCTION, NON-ORGANIC 11/09/2009   Vitamin D deficiency 08/13/2008   VARICOSE VEINS, LOWER EXTREMITIES 08/13/2008   UNS ADVRS EFF OTH RX MEDICINAL&BIOLOGICAL SBSTNC 08/13/2008   Hypothyroidism 05/02/2007   PSORIASIS 05/02/2007   Dyslipidemia 03/12/2007   Essential hypertension 03/12/2007    No current facility-administered medications on file prior to encounter.   Current Outpatient Medications on File Prior to Encounter  Medication Sig Dispense Refill   Acetaminophen-Codeine 300-30 MG tablet Take 1 tablet by mouth 3 (three) times daily as needed for pain.     apixaban (ELIQUIS) 5 MG TABS tablet Take 1 tablet (5 mg total) by mouth 2 (two) times daily. (Patient not taking: No sig reported) 60 tablet 0   atorvastatin (LIPITOR) 80 MG tablet TAKE 1 TABLET DAILY (Patient taking differently: Take 80 mg by mouth daily.) 90 tablet 2   budesonide-formoterol (SYMBICORT) 80-4.5 MCG/ACT inhaler Inhale 2 puffs into the lungs 2 (two) times daily. (Patient not taking: No  sig reported) 1 Inhaler 3   clobetasol ointment (TEMOVATE) 0.05 % APPLY TOPICALLY TWICE A DAY (Patient not taking: No sig reported) 30 g 23   diclofenac sodium (VOLTAREN) 1 % GEL APPLY 4 GRAMS TOPICALLY FOUR TIMES A DAY AS NEEDED FOR KNEE PAIN (Patient taking differently: Apply 4 g topically 4 (four) times  daily as needed (pain).) 200 g 28   Ibuprofen (ADVIL PO) Take 1 tablet by mouth daily as needed (pain).     ketoconazole (NIZORAL) 2 % cream Apply 1 application topically 2 (two) times daily as needed for irritation. 30 g 0   metoprolol tartrate (LOPRESSOR) 50 MG tablet Take 50 mg by mouth 2 (two) times daily.     omega-3 acid ethyl esters (LOVAZA) 1 G capsule Take 2 capsules (2 g total) by mouth 2 (two) times daily. (Patient not taking: Reported on 03/20/2021) 120 capsule 11   polyethylene glycol (MIRALAX / GLYCOLAX) 17 g packet Take 17 g by mouth daily as needed for severe constipation. (Patient not taking: No sig reported) 14 each 0   prochlorperazine (COMPAZINE) 10 MG tablet Take 10 mg by mouth every 6 (six) hours as needed for nausea or vomiting.     sildenafil (VIAGRA) 100 MG tablet TAKE 1 TABLET BY MOUTH 1 HOUR BEFORE NEEDED FOR ERECTILE DYSFUNCTION (Patient not taking: No sig reported) 10 tablet 2   TRICOR 48 MG tablet TAKE 1 TABLET DAILY (Patient taking differently: Take 48 mg by mouth daily.) 90 tablet 3   Vitamin D, Ergocalciferol, (DRISDOL) 1.25 MG (50000 UNIT) CAPS capsule TAKE 1 CAPSULE EVERY 7 DAYS FOR 12 DOSES (Patient not taking: No sig reported) 12 capsule 0    Past Medical History:  Diagnosis Date   ED (erectile dysfunction)    Hyperlipidemia    Hypertension    Hypothyroidism    Psoriasis    Stroke ([place])    Varicose veins    Vitamin D deficiency     Past Surgical History:  Procedure Laterality Date   JOINT REPLACEMENT     joint replacement rt and left     rotator cuff repair, right shoulder per Dr. Tonita Cong  1996   torn right bicepts muscle  [place]  01/17/10   redone on right hip on 09/05/10    Social History   Tobacco Use   Smoking status: Former   Smokeless tobacco: Never  Scientific laboratory technician Use: Never used  Substance Use Topics   Alcohol use: No   Drug use: No    Family History  Problem Relation Age of Onset   Breast cancer Other      PE: Vitals:   08/24/21 0330 08/24/21 0345 08/24/21 0400 08/24/21 0415  BP: 130/79 (!) 143/77 137/84 123/74  Pulse: 74 80 83 84  Resp: 15 17 17  (!) 32  Temp:   97.6 F (36.4 C)   TempSrc:   Oral   SpO2: 98% 98% 97% 97%  Weight:      Height:       Patient appears to be in no acute distress  patient is alert and oriented x3 Atraumatic normocephalic head No cervical or supraclavicular lymphadenopathy appreciated No increased work of breathing, no audible wheezes/rhonchi Regular sinus rhythm/rate Abdomen is soft, nontender, nondistended, no CVA Significant suprapubic tenderness and fullness. Lower extremities are symmetric without appreciable edema Grossly neurologically intact No identifiable skin lesions  Recent Labs    08/23/21 1447  WBC 10.8*  HGB 15.2  HCT 46.2   Recent Labs    08/23/21 1447  NA 134*  K 3.7  CL 103  CO2 25  GLUCOSE 87  BUN 30*  CREATININE 1.80*  CALCIUM 9.0   Recent Labs    08/23/21 1447  INR 1.0   No results for input(s): LABURIN in the last 72 hours. Results for orders placed or performed during the hospital encounter of 08/23/21  Resp Panel by RT-PCR (Flu A&B, Covid) Nasopharyngeal Swab     Status: None   Collection Time: 08/23/21  2:47 PM   Specimen: Nasopharyngeal Swab; Nasopharyngeal(NP) swabs in vial transport medium  Result Value Ref Range Status   SARS Coronavirus 2 by RT PCR NEGATIVE NEGATIVE Final    Comment: (NOTE) SARS-CoV-2 target nucleic acids are NOT DETECTED.  The SARS-CoV-2 RNA is generally detectable in upper respiratory specimens during the acute phase of infection. The lowest concentration of SARS-CoV-2 viral copies this assay can detect is 138 copies/mL. A negative result does not preclude SARS-Cov-2 infection and should not be used as the sole basis for treatment or other patient management decisions. A negative result may occur with  improper specimen collection/handling, submission of specimen  other than nasopharyngeal swab, presence of viral mutation(s) within the areas targeted by this assay, and inadequate number of viral copies(<138 copies/mL). A negative result must be combined with clinical observations, patient history, and epidemiological information. The expected result is Negative.  Fact Sheet for Patients:  EntrepreneurPulse.com.au  Fact Sheet for Healthcare Providers:  IncredibleEmployment.be  This test is no t yet approved or cleared by the [place] FDA and  has been authorized for detection and/or diagnosis of SARS-CoV-2 by FDA under an Emergency Use Authorization (EUA). This EUA will remain  in effect (meaning this test can be used) for the duration of the COVID-19 declaration under Section 564(b)(1) of the Act, 21 U.S.C.section 360bbb-3(b)(1), unless the authorization is terminated  or revoked sooner.       Influenza A by PCR NEGATIVE NEGATIVE Final   Influenza B by PCR NEGATIVE NEGATIVE Final    Comment: (NOTE) The Xpert Xpress SARS-CoV-2/FLU/RSV plus assay is intended as an aid in the diagnosis of influenza from Nasopharyngeal swab specimens and should not be used as a sole basis for treatment. Nasal washings and aspirates are unacceptable for Xpert Xpress SARS-CoV-2/FLU/RSV testing.  Fact Sheet for Patients: EntrepreneurPulse.com.au  Fact Sheet for Healthcare Providers: IncredibleEmployment.be  This test is not yet approved or cleared by the [place] FDA and has been authorized for detection and/or diagnosis of SARS-CoV-2 by FDA under an Emergency Use Authorization (EUA). This EUA will remain in effect (meaning this test can be used) for the duration of the COVID-19 declaration under Section 564(b)(1) of the Act, 21 U.S.C. section 360bbb-3(b)(1), unless the authorization is terminated or revoked.  Performed at [facility], [place] [address], [place], [place] [address]   MRSA Next Gen by PCR, Nasal     Status: None   Collection Time: 08/23/21  7:02 PM   Specimen: Nasal Mucosa; Nasal Swab  Result Value Ref Range Status   MRSA by PCR Next Gen NOT DETECTED NOT DETECTED Final    Comment: (NOTE) The GeneXpert MRSA Assay (FDA approved for NASAL specimens only), is one component of a comprehensive MRSA colonization surveillance program. It is not intended to diagnose MRSA infection nor to guide or monitor treatment for MRSA infections. Test performance is not FDA approved in patients less than 65 years old. Performed  at [facility], [place] [address] [place],  [address]     Imaging: No abdominal imaging was obtained  Imp: 88 year old male with a history of BPH and prostamegaly with baseline obstructive voiding symptoms developed urinary retention here in the hospital and ultimately had a false passage on the initial attempt of placement of Foley catheter.  Recommendations: Foley catheter was placed with the cystoscope.  He was noted to have a false passage in the posterior bulbar urethra.  Initially the urine returned clear, but at the end of the initial output it appeared more murky and blood-tinged.  Suspect the blood is from the prostate.  Would recommend that his Eliquis be held for at least the next 48 hours.  Would recommend he be started on tamsulosin if deemed eligible and that we keep his catheter in for 1 week and have him follow-up in urology for a voiding trial.  The patient should undergo a complete hematuria evaluation as it seemed as if he was having hematuria prior to his traumatic Foley.  If possible, today when the patient has his repeat head CT he did also have a noncontrast abdomen pelvis to evaluate the urinary tract.  I will work to get the patient scheduled for follow-up in our clinic early next week.  - start flomax - foley x  1 week, voiding trial as outpatient - CT A/P for  hematuria.  Ardis Hughs

## 2021-08-24 NOTE — Procedures (Signed)
Pre-procedure diagnosis: difficult foley catheterization Post-procedure diagnosis: as above  Procedure performed: placement of complicated foley  Surgeon: Dr. Ardis Hughs  Findings: False passage in the posterior bulbar urethra Specimen: None  Drains: 18 French council tip Foley catheter  Indications:  Patient unable to void on his own.  Nursing staff have been unsuccessful at placing catheter.  Procedure:  Gentials were prepped and draped in the routine sterile fashion.  10cc of 1% viscous lidocaine jelly was then injected into the patient's urethra.  A 73F coude tipped catheter was then gently passed in to the urethra.  The catheter did not appear to be in the correct position and there was no urine return.  As such the catheter was removed.  The cystoscope was then set up bedside and a flexible cystoscope was then gently passed to the patient's urethra and into the bladder.  He was noted to have a false patch in the posterior aspect of the bulbar urethra just distal to the membranous urethra.  The prostatic urethra was noted to be quite large.  The bladder was full.  I advanced a sensor wire through the scope and into the bladder removing the scope over the wire.  I then passed an 60 [place] council tip catheter over the wire into the patient's bladder removing the wire.  Clear yellow urine was obtained and the wire was removed.  10 cc of sterile water was injected into the balloon and the catheter was attached to the Foley catheter bag.    Patient tolerated the procedure well - no immediate issues.  Disposition: Start flomax, repeat voiding trial 7 days.

## 2021-08-24 NOTE — Progress Notes (Signed)
Brendan Long has attempted to call the urologist several times with no answer. Walked patient around the room and allowed him to stand to attempt to urinate. Patient is still unable to urinate.

## 2021-08-24 NOTE — Plan of Care (Signed)
°  Problem: Self-Care: Goal: Ability to communicate needs accurately will improve Outcome: Progressing   Problem: Intracerebral Hemorrhage Tissue Perfusion: Goal: Complications of Intracerebral Hemorrhage will be minimized Outcome: Progressing   Problem: Ischemic Stroke/TIA Tissue Perfusion: Goal: Complications of ischemic stroke/TIA will be minimized Outcome: Progressing   Problem: Spontaneous Subarachnoid Hemorrhage Tissue Perfusion: Goal: Complications of Spontaneous Subarachnoid Hemorrhage will be minimized Outcome: Progressing

## 2021-08-24 NOTE — Plan of Care (Signed)
°  Problem: Self-Care: Goal: Ability to communicate needs accurately will improve 08/24/2021 2348 by Narda Rutherford, RN Outcome: Progressing 08/24/2021 2318 by Narda Rutherford, RN Outcome: Progressing   Problem: Intracerebral Hemorrhage Tissue Perfusion: Goal: Complications of Intracerebral Hemorrhage will be minimized 08/24/2021 2348 by Narda Rutherford, RN Outcome: Progressing 08/24/2021 2318 by Narda Rutherford, RN Outcome: Progressing   Problem: Ischemic Stroke/TIA Tissue Perfusion: Goal: Complications of ischemic stroke/TIA will be minimized 08/24/2021 2348 by Narda Rutherford, RN Outcome: Progressing 08/24/2021 2318 by Narda Rutherford, RN Outcome: Progressing   Problem: Spontaneous Subarachnoid Hemorrhage Tissue Perfusion: Goal: Complications of Spontaneous Subarachnoid Hemorrhage will be minimized 08/24/2021 2348 by Narda Rutherford, RN Outcome: Progressing 08/24/2021 2318 by Narda Rutherford, RN Outcome: Progressing

## 2021-08-24 NOTE — TOC CAGE-AID Note (Signed)
Transition of Care [facility]) - CAGE-AID Screening   Patient Details  Name: Brendan Long MRN: [id number] Date of Birth: Dec 14, 1928  Transition of Care [facility]) CM/SW Contact:    Syesha Thaw C Tarpley-Carter, [place] Phone Number: 08/24/2021, 12:15 PM   Clinical Narrative: Pt is unable to participate in Cage Aid.  Pt is not appropriate for assessment.  Ahren Pettinger Tarpley-Carter, MSW, LCSW-A Pronouns:  She/Her/Hers [place] Transitions of Care Clinical Social Worker Direct Number:  [phone] [email or web address]  CAGE-AID Screening: Substance Abuse Screening unable to be completed due to: : Patient unable to participate             Substance Abuse Education Offered: No

## 2021-08-24 NOTE — Progress Notes (Signed)
Overall stable.  No significant headache.  Back and hip pain improved.  Patient required Foley placement last night secondary to urinary retention.  Awake and alert.  Oriented and appropriate.  Motor and sensory function stable.  Abdomen soft.  Follow-up head CT scan was stable small volume left convexity subdural hematoma without mass-effect.  Patient also with CT scan of his abdomen which demonstrates some blood in his bladder but no other significant findings aside from prostatic hypertrophy.  Overall doing well.  Mobilize with therapy.  Work towards discharge over the next day or so.

## 2021-08-25 ENCOUNTER — Inpatient Hospital Stay (HOSPITAL_COMMUNITY): Payer: Medicare Other

## 2021-08-25 DIAGNOSIS — S065XAA Traumatic subdural hemorrhage with loss of consciousness status unknown, initial encounter: Secondary | ICD-10-CM | POA: Diagnosis not present

## 2021-08-25 LAB — GLUCOSE, CAPILLARY: Glucose-Capillary: 130 mg/dL — ABNORMAL HIGH (ref 70–99)

## 2021-08-25 LAB — URINE CULTURE

## 2021-08-25 IMAGING — CT CT HEAD CODE STROKE
3 of 6 series · 15 of 47 positions shown, 18 images · non-contrast
Comparison: [DATE].

CLINICAL DATA: Code stroke.  Nonresponsive, fixed gaze



[Series 3: head 5.0 st · axial · 0.47mm/px · z∈[-170,-36]mm · 10 of 33 slices shown, 13 images]
[im 3/33  brain]
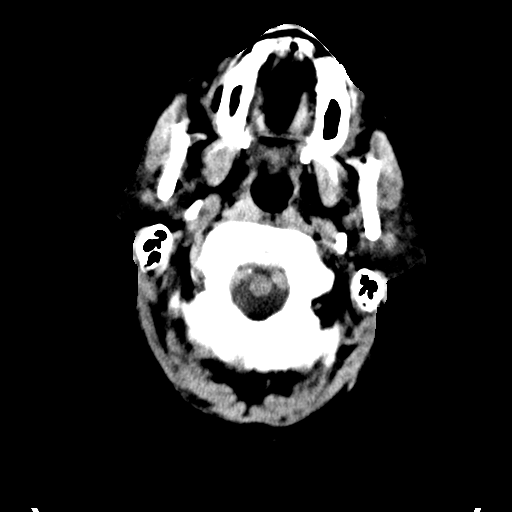
[im 3/33  bone]
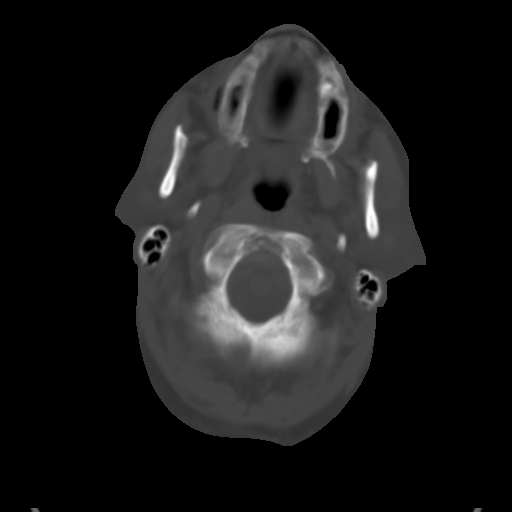
[im 5/33  brain]
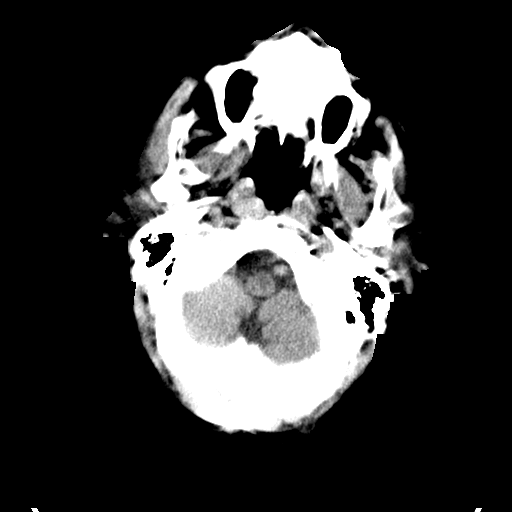
[im 10/33  brain]
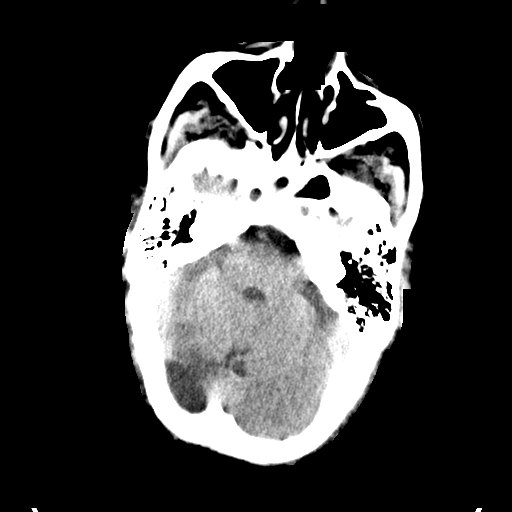
[im 12/33  brain]
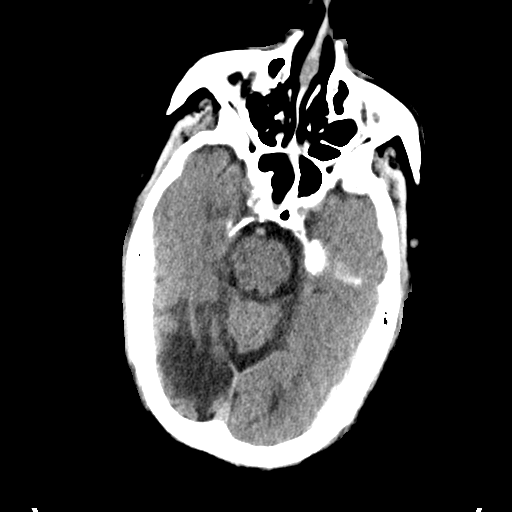
[im 14/33  brain]
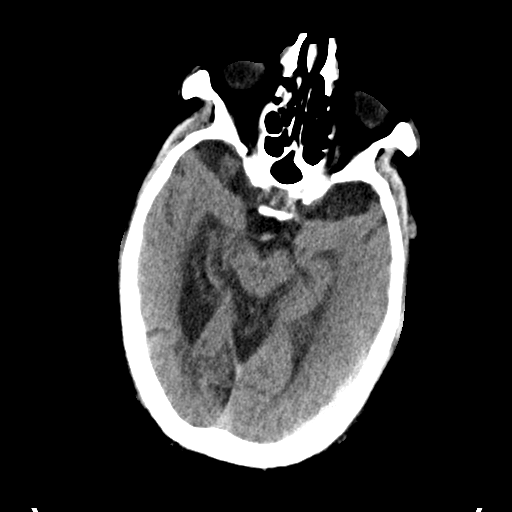
[im 14/33  bone]
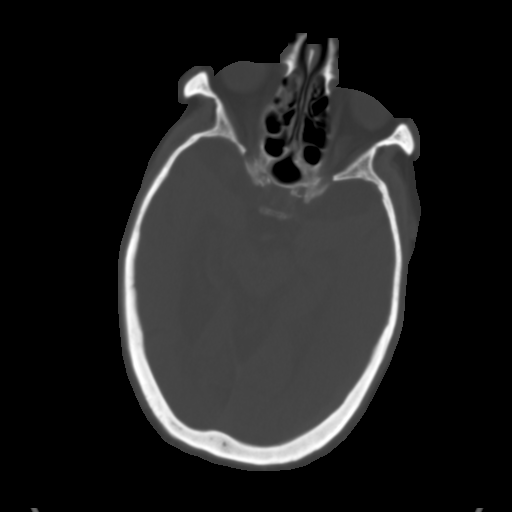
[im 19/33  brain]
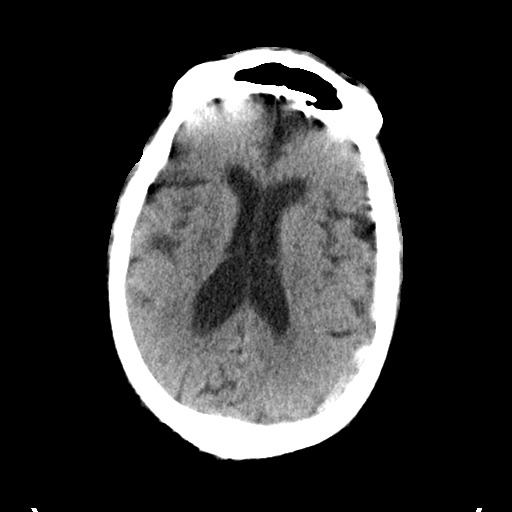
[im 21/33  brain]
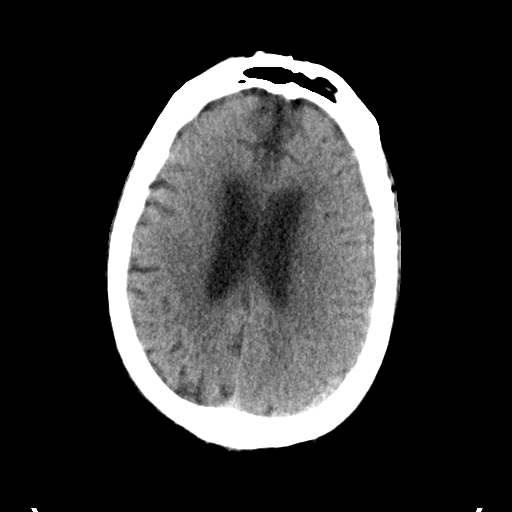
[im 23/33  brain]
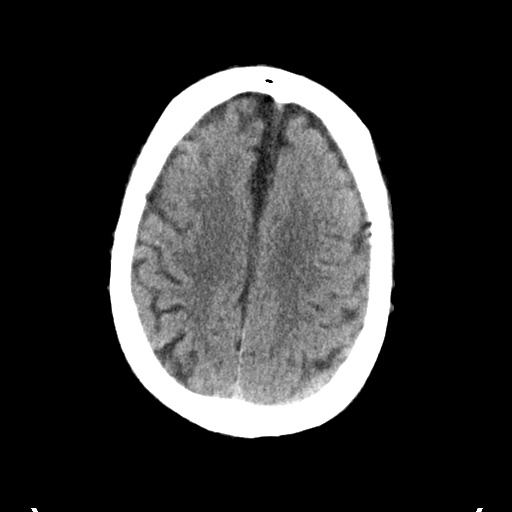
[im 28/33  brain]
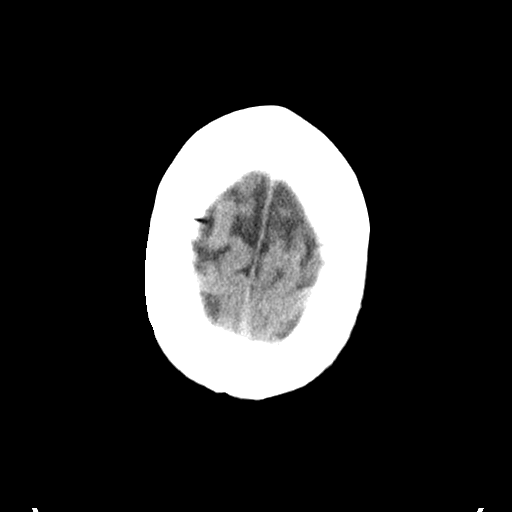
[im 28/33  bone]
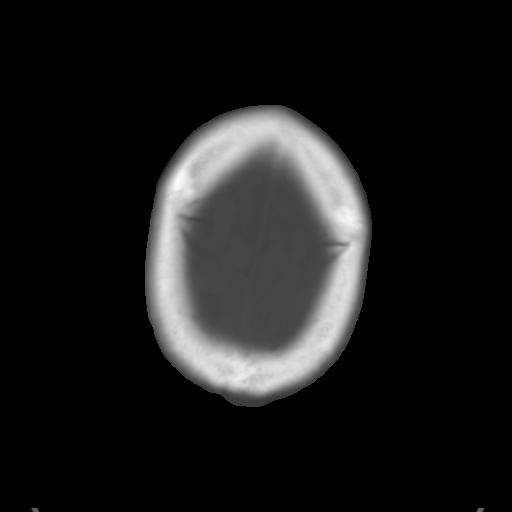
[im 30/33  brain]
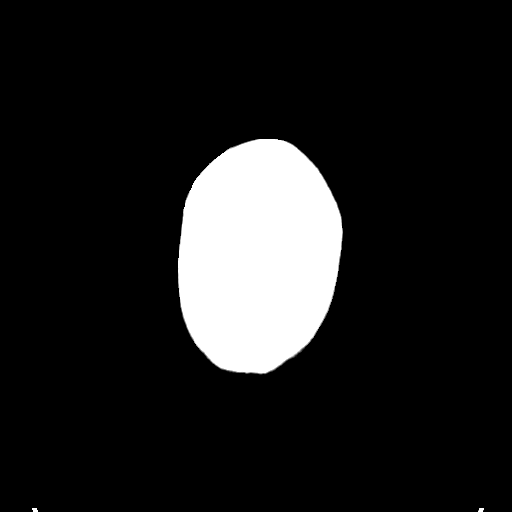

[Series 5: head 3.0 cor st · coronal · 0.35mm/px · 3 of 71 slices shown]
[im 18/71  brain]
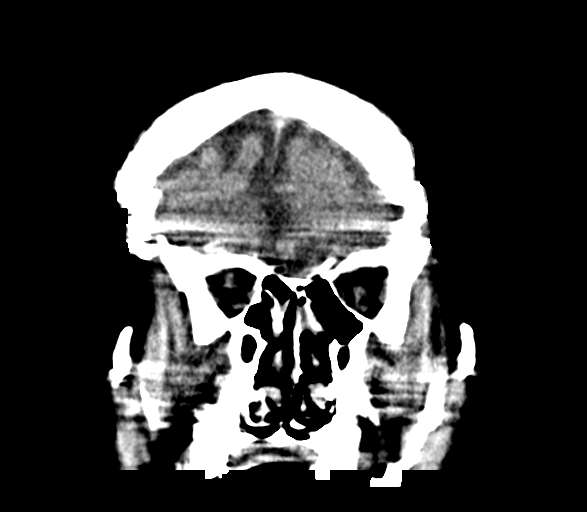
[im 36/71  brain]
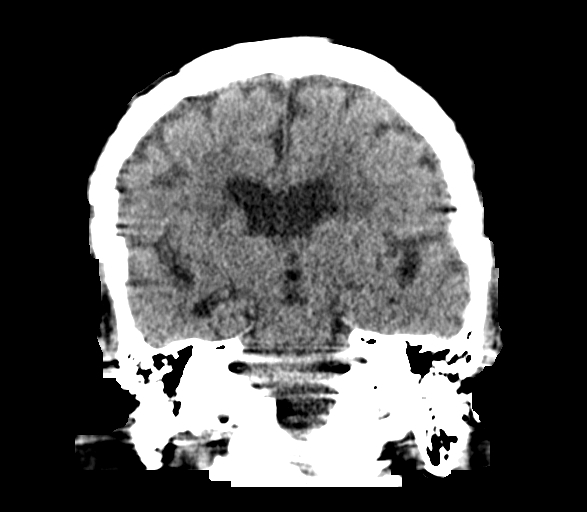
[im 53/71  brain]
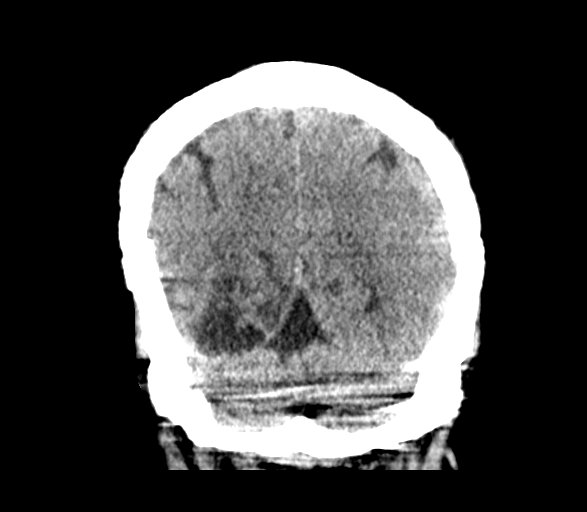

[Series 10: head 3.0 sag st · sagittal · 0.35mm/px · 2 of 57 slices shown]
[im 19/57  brain]
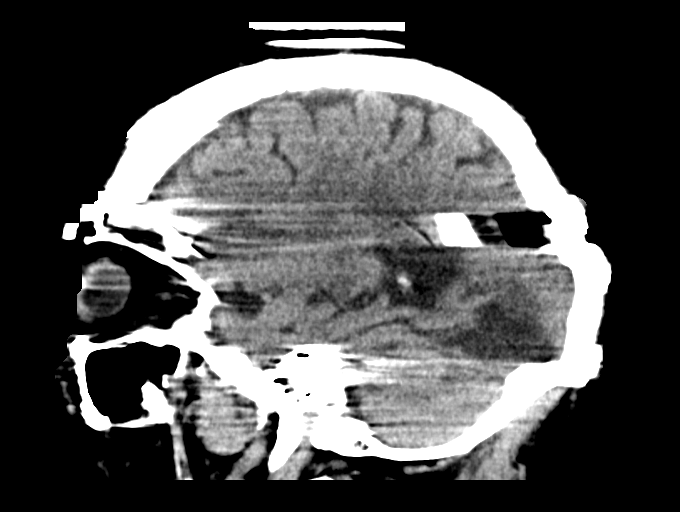
[im 38/57  brain]
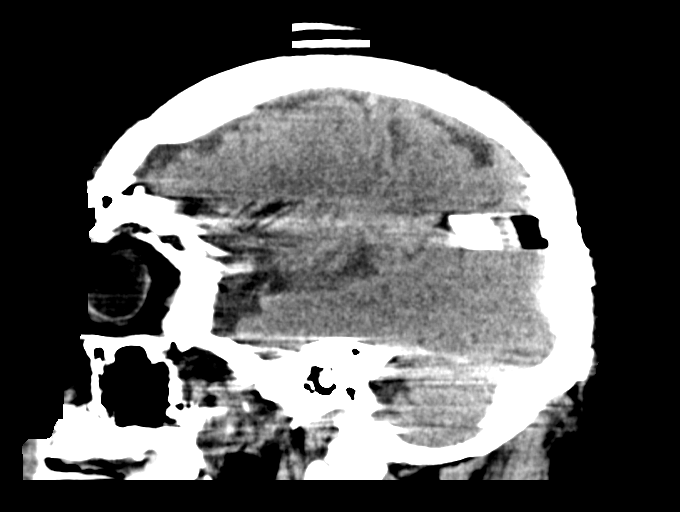

[15 of 47 positions shown; findings below may reference images not displayed]

FINDINGS: Evaluation is limited by motion artifact, despite repeat imaging.

Brain: Left subdural hematoma is again noted, which appears
unchanged, although evaluation is limited by motion. No new
extra-axial collection. Redemonstrated remote infarcts in the right
occipital lobe and right cerebellum. No definite new cortical
infarct. No acute parenchymal hemorrhage, mass, mass effect, or
significant midline shift. Unchanged size and configuration of the
ventricles.

Vascular: No definite hyperdense vessel.

Skull: No definite acute fracture or focal lesion.

Sinuses/Orbits: Mucosal thickening throughout the paranasal sinuses.
The orbits are grossly unremarkable.

Other: None.

ASPECTS (Alberta Stroke Program Early CT Score)

- Ganglionic level infarction (caudate, lentiform nuclei, internal
capsule, insula, M1-M3 cortex): 7

- Supraganglionic infarction (M4-M6 cortex): 3

Total score (0-10 with 10 being normal): 10
IMPRESSION: 1. Evaluation is significantly limited by motion artifact, despite
repeat imaging. Within this limitation, no acute intracranial
process. Grossly unchanged size of a left subdural hematoma.
2. ASPECTS is 10

Code stroke imaging results were communicated on [DATE] at [DATE] to provider Dr. LIENAD via secure text paging.

## 2021-08-25 MED ORDER — HYDROCODONE-ACETAMINOPHEN 5-325 MG PO TABS: 1.0000 | ORAL_TABLET | Freq: Four times a day (QID) | ORAL | 0 refills | Status: DC | PRN

## 2021-08-25 MED ORDER — SODIUM CHLORIDE 0.9 % IV SOLN
2000.0000 mg | Freq: Once | INTRAVENOUS | Status: AC
  Administered 2021-08-25: 2000 mg via INTRAVENOUS

## 2021-08-25 MED ORDER — TAMSULOSIN HCL 0.4 MG PO CAPS: 0.4000 mg | ORAL_CAPSULE | Freq: Every day | ORAL | 0 refills | Status: DC

## 2021-08-25 NOTE — Discharge Summary (Signed)
Physician Discharge Summary     Providing Compassionate, Quality Care - Together   Patient ID: Brendan Long MRN: [id number] DOB/AGE: [birth year] 88 y.o.  Admit date: 08/23/2021 Discharge date: 08/25/2021  Admission Diagnoses: SDH  Discharge Diagnoses:  Principal Problem:   SDH (subdural hematoma)   Discharged Condition: good  Hospital Course: Patient suffered a fall from ground level on 08/23/2021, sustaining a small subdural hematoma. He was admitted to 4N ICU for observation. Follow up imaging performed on 08/24/2021 was stable, showing a small volume left convexity subdural hematoma without mass-effect. His hospital course was complicated by urinary retention. This appears to be more of a chronic issue. An indwelling catheter had to be placed by Urology and the patient was started on Flomax. The catheter will remain at discharge and the patient is to follow up as an outpatient with Dr. Louis Meckel. He has worked with both physical and occupational therapies who feel the patient is ready for discharge home. He is ambulating independently and without difficulty. He is tolerating a normal diet. His pain is well-controlled with oral pain medication. He is ready for discharge home.   Consults: urology, PT, OT  Significant Diagnostic Studies: radiology: CT ABDOMEN PELVIS WO CONTRAST  Result Date: 08/24/2021 CLINICAL DATA:  Gross hematuria, hip pain EXAM: CT ABDOMEN AND PELVIS WITHOUT CONTRAST TECHNIQUE: Multidetector CT imaging of the abdomen and pelvis was performed following the standard protocol without IV contrast. RADIATION DOSE REDUCTION: This exam was performed according to the departmental dose-optimization program which includes automated exposure control, adjustment of the mA and/or kV according to patient size and/or use of iterative reconstruction technique. COMPARISON:  None. FINDINGS: Lower chest: No acute abnormality.  Coronary artery calcifications. Hepatobiliary: No solid  liver abnormality is seen. No gallstones, gallbladder wall thickening, or biliary dilatation. Pancreas: Unremarkable. No pancreatic ductal dilatation or surrounding inflammatory changes. Spleen: Normal in size without significant abnormality. Adrenals/Urinary Tract: Adrenal glands are unremarkable. Kidneys are normal, without renal calculi, solid lesion, or hydronephrosis. Foley catheter within the urinary bladder. Wall thickening of the urinary bladder, with hyperdense internal contents. Evaluation is somewhat limited by metallic streak artifact from adjacent hip arthroplasty Stomach/Bowel: Stomach is within normal limits. Appendix appears normal. No evidence of bowel wall thickening, distention, or inflammatory changes. Vascular/Lymphatic: Aortic atherosclerosis. No enlarged abdominal or pelvic lymph nodes. Reproductive: Severe prostatomegaly. Other: Small, fat containing bilateral inguinal hernias. No ascites. Musculoskeletal: No acute or significant osseous findings. Status post right hip total arthroplasty. IMPRESSION: 1. No urinary tract calculus or hydronephrosis. 2. Wall thickening of the urinary bladder, with hyperdense internal contents, suggestive of blood products. Evaluation is somewhat limited by metallic streak artifact from adjacent hip arthroplasty. 3. Severe prostatomegaly with Foley catheter. 4. Coronary artery disease. Aortic Atherosclerosis (ICD10-I70.0). Electronically Signed   By: Delanna Ahmadi M.D.   On: 08/24/2021 09:48   CT HEAD WO CONTRAST (5MM)  Result Date: 08/24/2021 CLINICAL DATA:  Follow-up examination for subdural hematoma. EXAM: CT HEAD WITHOUT CONTRAST TECHNIQUE: Contiguous axial images were obtained from the base of the skull through the vertex without intravenous contrast. RADIATION DOSE REDUCTION: This exam was performed according to the departmental dose-optimization program which includes automated exposure control, adjustment of the mA and/or kV according to patient size  and/or use of iterative reconstruction technique. COMPARISON:  Prior CT from 08/23/2021. FINDINGS: Brain: Previously identified left subdural hematoma again seen, relatively unchanged measuring up to 7 mm in maximal thickness. No more than mild mass effect on the subjacent left cerebral hemisphere with  no more than trace 2 mm left-to-right shift. No evidence for significant interval bleeding. No other new acute intracranial hemorrhage. No visible acute large vessel territory infarct. Chronic right PCA and cerebellar infarcts noted. Remote lacunar infarcts present about the bilateral basal ganglia. Underlying atrophy with chronic small vessel ischemic disease. Vascular: No hyperdense vessel. Calcified atherosclerosis present at the skull base. Skull: Scalp soft tissues and calvarium demonstrate no new finding. Sinuses/Orbits: Globes and orbital soft tissues demonstrate no acute finding. Chronic mucosal thickening noted throughout the ethmoidal air cells, sphenoid sinuses, and maxillary sinuses. Mastoid air cells remain clear. Other: None. IMPRESSION: 1. No significant interval change in size of left subdural hematoma measuring up to 7 mm in maximal thickness. Mild mass effect on the subjacent left cerebral hemisphere with no more than trace 2 mm left-to-right shift. 2. No other new acute intracranial abnormality. Electronically Signed   By: Jeannine Boga M.D.   On: 08/24/2021 03:29   CT HEAD WO CONTRAST (5MM)  Result Date: 08/23/2021 CLINICAL DATA:  Neck pain, left hip pain status post fall this morning. EXAM: CT HEAD WITHOUT CONTRAST CT CERVICAL SPINE WITHOUT CONTRAST TECHNIQUE: Multidetector CT imaging of the head and cervical spine was performed following the standard protocol without intravenous contrast. Multiplanar CT image reconstructions of the cervical spine were also generated. RADIATION DOSE REDUCTION: This exam was performed according to the departmental dose-optimization program which includes  automated exposure control, adjustment of the mA and/or kV according to patient size and/or use of iterative reconstruction technique. COMPARISON:  None. FINDINGS: Brain: Acute hemorrhage along the left cerebral convexity along the parietal lobe measuring 7 mm in thickness. 1-2 mm left-to-right midline shift. No evidence of acute infarction, ventriculomegaly, or mass effect. 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 generalized cerebral atrophy. Periventricular white matter low attenuation likely secondary to microangiopathy. Vascular: Cerebrovascular atherosclerotic calcifications are noted. No hyperdense vessels. Skull: Negative for fracture or focal lesion. Sinuses/Orbits: Visualized portions of the orbits are unremarkable. Visualized portions of the paranasal sinuses are unremarkable. Visualized portions of the mastoid air cells are unremarkable. Other: None. CT CERVICAL SPINE FINDINGS Alignment: Normal. Skull base and vertebrae: No acute fracture. No primary bone lesion or focal pathologic process. Soft tissues and spinal canal: No prevertebral fluid or swelling. No visible canal hematoma. Disc levels: Degenerative disease with disc height loss at C3-4, C4-5, C5-6, C6-7, C7-T1 and T1-2 with mild bilateral facet arthropathy. Bilateral uncovertebral degenerative changes at C3-4 and C4-5 with moderate-severe foraminal stenosis. Bilateral uncovertebral degenerative changes at C5-6 with mild foraminal stenosis. Upper chest: Lung apices are clear. Other: No fluid collection or hematoma. IMPRESSION: 1. Acute subdural hemorrhage along the left parietal convexity measuring 7 mm in thickness. 1-2 mm left right midline shift. 2.  No acute osseous injury of the cervical spine. Critical Value/emergent results were called by telephone at the time of interpretation on 08/23/2021 at 2:17 pm to provider [facility] , who verbally acknowledged these results. Electronically Signed   By: Kathreen Devoid M.D.   On:  08/23/2021 14:19   CT Cervical Spine Wo Contrast  Result Date: 08/23/2021 CLINICAL DATA:  Neck pain, left hip pain status post fall this morning. EXAM: CT HEAD WITHOUT CONTRAST CT CERVICAL SPINE WITHOUT CONTRAST TECHNIQUE: Multidetector CT imaging of the head and cervical spine was performed following the standard protocol without intravenous contrast. Multiplanar CT image reconstructions of the cervical spine were also generated. RADIATION DOSE REDUCTION: This exam was performed according to the departmental dose-optimization program which includes  automated exposure control, adjustment of the mA and/or kV according to patient size and/or use of iterative reconstruction technique. COMPARISON:  None. FINDINGS: Brain: Acute hemorrhage along the left cerebral convexity along the parietal lobe measuring 7 mm in thickness. 1-2 mm left-to-right midline shift. No evidence of acute infarction, ventriculomegaly, or mass effect. 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 generalized cerebral atrophy. Periventricular white matter low attenuation likely secondary to microangiopathy. Vascular: Cerebrovascular atherosclerotic calcifications are noted. No hyperdense vessels. Skull: Negative for fracture or focal lesion. Sinuses/Orbits: Visualized portions of the orbits are unremarkable. Visualized portions of the paranasal sinuses are unremarkable. Visualized portions of the mastoid air cells are unremarkable. Other: None. CT CERVICAL SPINE FINDINGS Alignment: Normal. Skull base and vertebrae: No acute fracture. No primary bone lesion or focal pathologic process. Soft tissues and spinal canal: No prevertebral fluid or swelling. No visible canal hematoma. Disc levels: Degenerative disease with disc height loss at C3-4, C4-5, C5-6, C6-7, C7-T1 and T1-2 with mild bilateral facet arthropathy. Bilateral uncovertebral degenerative changes at C3-4 and C4-5 with moderate-severe foraminal stenosis. Bilateral uncovertebral  degenerative changes at C5-6 with mild foraminal stenosis. Upper chest: Lung apices are clear. Other: No fluid collection or hematoma. IMPRESSION: 1. Acute subdural hemorrhage along the left parietal convexity measuring 7 mm in thickness. 1-2 mm left right midline shift. 2.  No acute osseous injury of the cervical spine. Critical Value/emergent results were called by telephone at the time of interpretation on 08/23/2021 at 2:17 pm to provider [facility] , who verbally acknowledged these results. Electronically Signed   By: Kathreen Devoid M.D.   On: 08/23/2021 14:19   CT Hip Left Wo Contrast  Result Date: 08/23/2021 CLINICAL DATA:  Hip trauma.  Fracture suspected. EXAM: CT OF THE LEFT HIP WITHOUT CONTRAST TECHNIQUE: Multidetector CT imaging of the left hip was performed according to the standard protocol. Multiplanar CT image reconstructions were also generated. RADIATION DOSE REDUCTION: This exam was performed according to the departmental dose-optimization program which includes automated exposure control, adjustment of the mA and/or kV according to patient size and/or use of iterative reconstruction technique. COMPARISON:  Pelvis and left hip radiographs 08/23/2021 FINDINGS: Bones/Joint/Cartilage Severe left femoroacetabular osteoarthritis, greatest within the posterosuperior aspect. Moderate subchondral sclerosis and subchondral degenerative cystic change. Moderate to high-grade left femoral head-neck junction and moderate peripheral left acetabular degenerative osteophytosis. Bridging degenerative osteophytes within the visualized portion of the inferior left sacroiliac joint. More mild endplate osteophytes of the right sacroiliac joint. Streak artifact from total right hip arthroplasty hardware. No perihardware lucency is seen to indicate hardware failure or loosening. No acute fracture is seen. Ligaments Suboptimally assessed by CT. Muscles and Tendons Grossly unremarkable. Soft tissues No left hip  joint effusion. Moderate vascular calcifications are noted. Mild-to-moderate fat containing left inguinal hernia. IMPRESSION:: IMPRESSION: 1. No left hip fracture is seen. 2. Severe left femoroacetabular osteoarthritis. 3. Status post total right hip arthroplasty without evidence of hardware failure. Electronically Signed   By: Yvonne Kendall M.D.   On: 08/23/2021 16:01   DG Chest Port 1 View  Result Date: 08/23/2021 CLINICAL DATA:  Neck and left hip pain, hematuria, fell EXAM: PORTABLE CHEST 1 VIEW COMPARISON:  03/19/2021 FINDINGS: Single frontal view of the chest demonstrates a stable cardiac silhouette. Stable ectasia and atherosclerosis of the thoracic aorta. No acute airspace disease, effusion, or pneumothorax. Chronic scarring at the left lung base. No acute bony abnormalities. IMPRESSION: 1. No acute intrathoracic process. Electronically Signed   By: Diana Eves.D.  On: 08/23/2021 14:59   DG Hip Unilat W or Wo Pelvis 2-3 Views Left  Result Date: 08/23/2021 CLINICAL DATA:  LEFT hip pain after falling. EXAM: DG HIP (WITH OR WITHOUT PELVIS) 2-3V LEFT COMPARISON:  03/07/2016 FINDINGS: Status post RIGHT total hip arthroplasty. 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 There is marked degenerative change of the LEFT hip, with near complete obliteration of the joint space and large osteophytes, similar to prior study. There is no acute fracture or subluxation. Degenerative changes are seen in the lumbar spine. IMPRESSION: No evidence for acute abnormality. Significant degenerative changes of the LEFT hip. Electronically Signed   By: Nolon Nations M.D.   On: 08/23/2021 14:25     Treatments: therapies: PT and OT  Discharge Exam: Blood pressure (!) 144/83, pulse 78, temperature 98.4 F (36.9 C), temperature source Oral, resp. rate 18, height 5' 10.5" (1.791 m), weight 74.8 kg, SpO2 95 %.  Responds to voice, oriented x 4 PERRLA Speech clear CN II-XII grossly intact MAE, Strength and  sensation intact Foley catheter in place   Disposition: Discharge disposition: 01-Home or Self Care        Allergies as of 08/25/2021   No Known Allergies      Medication List     STOP taking these medications    Acetaminophen-Codeine 300-30 MG tablet   ADVIL PO   apixaban 5 MG Tabs tablet Commonly known as: Eliquis   budesonide-formoterol 80-4.5 MCG/ACT inhaler Commonly known as: SYMBICORT   clobetasol ointment 0.05 % Commonly known as: TEMOVATE   omega-3 acid ethyl esters 1 g capsule Commonly known as: LOVAZA   polyethylene glycol 17 g packet Commonly known as: MIRALAX / GLYCOLAX   prochlorperazine 10 MG tablet Commonly known as: COMPAZINE   sildenafil 100 MG tablet Commonly known as: VIAGRA   Vitamin D (Ergocalciferol) 1.25 MG (50000 UNIT) Caps capsule Commonly known as: DRISDOL       TAKE these medications    atorvastatin 80 MG tablet Commonly known as: LIPITOR TAKE 1 TABLET DAILY   diclofenac sodium 1 % Gel Commonly known as: VOLTAREN APPLY 4 GRAMS TOPICALLY FOUR TIMES A DAY AS NEEDED FOR KNEE PAIN What changed: See the new instructions.   hydrochlorothiazide 12.5 MG capsule Commonly known as: MICROZIDE Take 1 capsule by mouth daily.   HYDROcodone-acetaminophen 5-325 MG tablet Commonly known as: NORCO/VICODIN Take 1 tablet by mouth every 6 (six) hours as needed for moderate pain or severe pain.   ketoconazole 2 % cream Commonly known as: NIZORAL Apply 1 application topically 2 (two) times daily as needed for irritation.   metoprolol tartrate 50 MG tablet Commonly known as: LOPRESSOR Take 50 mg by mouth 2 (two) times daily.   tamsulosin 0.4 MG Caps capsule Commonly known as: FLOMAX Take 1 capsule (0.4 mg total) by mouth daily.   Tricor 48 MG tablet Generic drug: fenofibrate TAKE 1 TABLET DAILY What changed: how much to take        Follow-up Information     [facility]. Schedule an appointment as soon as  possible for a visit in 1 week(s).   Why: Catheter removal Contact information: [place]        Earnie Larsson, MD. Schedule an appointment as soon as possible for a visit in 2 week(s).   Specialty: Neurosurgery Contact information: [address] [place] [address] [phone]  Signed: Viona Gilmore, DNP, AGNP-C Nurse Practitioner  [facility] [place] [address], [place], [place], [place] [address] P: [phone]     F: [phone]  08/25/2021, 10:36 AM

## 2021-08-25 NOTE — Progress Notes (Signed)
At 1530 entered room to complete discharge with family. Found patient setting in recliner non responsive to sternal rub but at rest snoring. Cbg obtained, code stroke called, moved patient to bed. Patient eyes fixed gaze to upper right, vitals taken. Code stoke response arrived. Patient taken to CT scan with team and nurse.

## 2021-08-25 NOTE — Progress Notes (Signed)
Nutrition Brief Note  Patient identified on the Malnutrition Screening Tool (MST) Report.  Admitting Dx: Subdural hematoma [S06.5XAA] SDH (subdural hematoma) [S06.5XAA] PMH:  Past Medical History:  Diagnosis Date   ED (erectile dysfunction)    Hyperlipidemia    Hypertension    Hypothyroidism    Psoriasis    Stroke ([place])    Varicose veins    Vitamin D deficiency     Medications:  Scheduled Meds:  Chlorhexidine Gluconate Cloth  6 each Topical Daily   docusate sodium  100 mg Oral BID   sodium chloride flush  3 mL Intravenous Q12H   sodium chloride flush  3 mL Intravenous Q12H   tamsulosin  0.4 mg Oral Daily  Continuous Infusions:  sodium chloride      Labs: Recent Labs  Lab 08/23/21 1447  NA 134*  K 3.7  CL 103  CO2 25  BUN 30*  CREATININE 1.80*  CALCIUM 9.0  GLUCOSE 87    Wt Readings from Last 15 Encounters:  08/23/21 74.8 kg  05/19/21 77.1 kg  03/19/21 78 kg  05/06/20 79.8 kg  02/11/20 80.2 kg  12/19/19 80.4 kg  12/05/19 81.1 kg  03/18/19 79.4 kg  08/30/18 81.7 kg  07/26/18 82.2 kg  02/01/18 79.8 kg  12/14/17 79.8 kg  11/21/17 78.6 kg  12/15/16 78.8 kg  12/16/15 79.8 kg    Body mass index is 23.34 kg/m. Patient meets criteria for normal based on current BMI.   Current diet order is heart healthy, patient is consuming approximately 100% of meals at this time. Pt is stable for discharge home per Neurosurgery. Discharge summary/orders have been signed. No nutrition interventions warranted at this time. If nutrition issues arise, please consult RD.    Theone Stanley., MS, RD, LDN (she/her/hers) RD pager number and weekend/on-call pager number located in [place].

## 2021-08-25 NOTE — Evaluation (Signed)
Physical Therapy Evaluation Patient Details Name: Brendan Long MRN: [id number] DOB: Feb 17, 1929 Today's Date: 08/25/2021  History of Present Illness  pt is a 88 y/o male admitted 1/17  s/p fall from standing height sustaning small L SDH with no mass effect.  HTN, Stroke, THR's, rotator cuff repair R shd,  Clinical Impression  Pt admitted with/for above incident and L SDH.  Pt needing min to min guard assist during mobility.  Pt currently limited functionally due to the problems listed. ( See problems list.)   Pt will benefit from PT to maximize function and safety in order to get ready for next venue listed below.         Recommendations for follow up therapy are one component of a multi-disciplinary discharge planning process, led by the attending physician.  Recommendations may be updated based on patient status, additional functional criteria and insurance authorization.  Follow Up Recommendations Acute inpatient rehab (3hours/day)    Assistance Recommended at Discharge Frequent or constant Supervision/Assistance (for 1-2 weeks if goes home directly)  Patient can return home with the following  A little help with walking and/or transfers;A little help with bathing/dressing/bathroom;Assistance with cooking/housework;Assist for transportation;Other (comment) (ST supervision/assist up to 24/7 initially until not needed.)    Equipment Recommendations    Recommendations for Other Services  Rehab consult    Functional Status Assessment Patient has had a recent decline in their functional status and demonstrates the ability to make significant improvements in function in a reasonable and predictable amount of time.     Precautions / Restrictions Precautions Precautions: Fall Restrictions Weight Bearing Restrictions: No      Mobility  Bed Mobility Overal bed mobility: Needs Assistance Bed Mobility: Rolling, Sidelying to Sit Rolling: Supervision Sidelying to sit: Min assist,  HOB elevated       General bed mobility comments: Needed assist to power up to sitting    Transfers Overall transfer level: Needs assistance Equipment used: Rolling walker (2 wheels) Transfers: Sit to/from Stand Sit to Stand: Min guard           General transfer comment: No support needed from the recliner.    Ambulation/Gait Ambulation/Gait assistance: Min assist, Min guard Gait Distance (Feet): 100 Feet (initially with the RW, then another 75 feet with the SPC and min guard assist) Assistive device: Rolling walker (2 wheels), Straight cane Gait Pattern/deviations: Step-through pattern   Gait velocity interpretation: 1.31 - 2.62 ft/sec, indicative of limited community ambulator   General Gait Details: Initially guarded and mildly unsteady with RW, then improved to point where cane was appropriate and pt walked additional 75 feet with cane and min guard, carrying his foley bag.  Stairs            Wheelchair Mobility    Modified Rankin (Stroke Patients Only)       Balance Overall balance assessment: Needs assistance Sitting-balance support: Single extremity supported, Feet supported Sitting balance-Leahy Scale: Fair     Standing balance support: Bilateral upper extremity supported, No upper extremity supported Standing balance-Leahy Scale: Fair Standing balance comment: Again, improved from earlier, pt able to stand for  without UE assist to put on gown and donn his mask, though AD reliant dynamically                             Pertinent Vitals/Pain Pain Assessment Pain Assessment: Faces Faces Pain Scale: Hurts a little bit Pain Location: general Pain Descriptors / Indicators:  Discomfort, Guarding Pain Intervention(s): Monitored during session    Home Living Family/patient expects to be discharged to:: Private residence Living Arrangements: Alone Available Help at Discharge: Family;Available 24 hours/day;Available PRN/intermittently (for  short time period) Type of Home: House Home Access: Stairs to enter   [employer] of Steps: 2 steps   Home Layout: One level Home Equipment: Chartered certified accountant;Shower seat - built in Additional Comments: States that daughter can stay wtih him whenhe returns home    Prior Function Prior Level of Function : Independent/Modified Independent;Driving                     Hand Dominance   Dominant Hand: Right    Extremity/Trunk Assessment   Upper Extremity Assessment Upper Extremity Assessment: Generalized weakness    Lower Extremity Assessment Lower Extremity Assessment: Generalized weakness    Cervical / Trunk Assessment Cervical / Trunk Assessment: Kyphotic  Communication   Communication: No difficulties  Cognition Arousal/Alertness: Awake/alert Behavior During Therapy: WFL for tasks assessed/performed Overall Cognitive Status: No family/caregiver present to determine baseline cognitive functioning                                 General Comments: After pt awake for a while the safety concerns seen by OT earlier were not as severe or significant        General Comments General comments (skin integrity, edema, etc.): vss on RA    Exercises     Assessment/Plan    PT Assessment Patient needs continued PT services  PT Problem List Decreased strength;Decreased activity tolerance;Decreased balance;Decreased mobility;Decreased knowledge of use of DME       PT Treatment Interventions DME instruction;Gait training;Stair training;Functional mobility training;Therapeutic activities;Balance training;Neuromuscular re-education;Patient/family education    PT Goals (Current goals can be found in the Care Plan section)  Acute Rehab PT Goals Patient Stated Goal: I want to get home PT Goal Formulation: With patient Time For Goal Achievement: 09/01/21 Potential to Achieve Goals: Good    Frequency Min 3X/week     Co-evaluation                AM-PAC PT "6 Clicks" Mobility  Outcome Measure Help needed turning from your back to your side while in a flat bed without using bedrails?: A Little Help needed moving from lying on your back to sitting on the side of a flat bed without using bedrails?: A Little Help needed moving to and from a bed to a chair (including a wheelchair)?: A Little Help needed standing up from a chair using your arms (e.g., wheelchair or bedside chair)?: A Little Help needed to walk in hospital room?: A Little Help needed climbing 3-5 steps with a railing? : A Little 6 Click Score: 18    End of Session   Activity Tolerance: Patient tolerated treatment well;Patient limited by fatigue Patient left: in chair;with call bell/phone within reach;with chair alarm set Nurse Communication: Mobility status PT Visit Diagnosis: Other abnormalities of gait and mobility (R26.89);Muscle weakness (generalized) (M62.81);Difficulty in walking, not elsewhere classified (R26.2)    Time: 9417-4081 PT Time Calculation (min) (ACUTE ONLY): 20 min   Charges:   PT Evaluation $PT Eval Moderate Complexity: 1 Mod          08/25/2021  Ginger Carne., PT Acute Rehabilitation Services [phone]  (pager) [phone]  (office)  Tessie Fass Cyndie Woodbeck 08/25/2021, 3:32 PM

## 2021-08-25 NOTE — Progress Notes (Addendum)
° °  Inpatient Rehab Admissions Coordinator :  Per therapy recommendations, patient was screened for CIR candidacy by Danne Baxter RN MSN.  At this time patient appears to be a potential candidate for CIR. I recommend a rehab consult per protocol for full assessment. Please call me with any questions. Noted plans for discharge in place. Acute team and TOC notified.  Danne Baxter RN MSN Admissions Coordinator [phone]

## 2021-08-25 NOTE — Consult Note (Addendum)
Neurology Consultation  Reason for Consult: Altered mental status- Code Stroke, unresponsive  Referring Physician: Dr. Annette Stable   CC: Patient is unable to provide a chief complaint due to his mental status  History is obtained from: Chart review, patient's family at bedside, bedside RN   HPI: Brendan Long is a 88 y.o. male with a medical history significant for hyperlipidemia, essential hypertension, history of stroke, vitamin D deficiency, and recent admission on 1/17 after a mechanical fall from standing with CT findings of small left posterior convexity subdural hematoma without significant mass-effect.  Patient was evaluated overnight in the ICU before transitioning to the floor.  Patient was seen ambulating independently, tolerating a normal diet, and communicating without difficulty and was planned for discharge to home this afternoon.  When family arrived at around 16:00 to assist patient back home, patient was found sitting up in the bedside recliner and appeared to be staring off past family.  Family said that he would respond to questions but not fully.  He then complained of constipation for about 1 week before holding his abdomen, closing his eyes, and appeared to be wincing in pain.  He began to have garbled speech, laid his head to the side, became diaphoretic and was unresponsive.  Bedside nurse found patient to be diaphoretic and nonresponsive to sternal rub so he assisted the patient back into the bed.  When the nurse opened the patient's eyes he noticed that his gaze was fixed rightward and upward with a limp limbs and a code stroke was activated.  There was no overt seizure activity noted by family or bedside nurse.  LKW: 13:00 TNK given?: no, presentation is felt to be more consistent with a seizure activity.  Patient with recent subdural hematoma as a contraindication for TNKase. IR Thrombectomy? No, presentation is not consistent with LVO Modified Rankin Scale: 1-No significant  post stroke disability and can perform usual duties with stroke symptoms  ROS: Unable to obtain due to altered mental status.   Past Medical History:  Diagnosis Date   ED (erectile dysfunction)    Hyperlipidemia    Hypertension    Hypothyroidism    Psoriasis    Stroke ([place])    Varicose veins    Vitamin D deficiency    Past Surgical History:  Procedure Laterality Date   JOINT REPLACEMENT     joint replacement rt and left     rotator cuff repair, right shoulder per Dr. Tonita Cong  1996   torn right bicepts muscle  [place]  01/17/10   redone on right hip on 09/05/10   Family History  Problem Relation Age of Onset   Breast cancer Other    Social History:   reports that he has quit smoking. He has never used smokeless tobacco. He reports that he does not drink alcohol and does not use drugs.  Medications  Current Facility-Administered Medications:    0.9 %  sodium chloride infusion, 250 mL, Intravenous, PRN, Earnie Larsson, MD   acetaminophen (TYLENOL) tablet 650 mg, 650 mg, Oral, Q6H PRN, 650 mg at 08/23/21 2218 **OR** acetaminophen (TYLENOL) suppository 650 mg, 650 mg, Rectal, Q6H PRN, Earnie Larsson, MD   Chlorhexidine Gluconate Cloth 2 % PADS 6 each, 6 each, Topical, Daily, Earnie Larsson, MD, 6 each at 08/25/21 0910   docusate sodium (COLACE) capsule 100 mg, 100 mg, Oral, BID, Pool, Mallie Mussel, MD, 100 mg at 08/25/21 1039   hydrALAZINE (APRESOLINE) injection 20 mg, 20 mg, Intravenous, Q4H  PRN, Earnie Larsson, MD, 20 mg at 08/24/21 2141   HYDROcodone-acetaminophen (NORCO/VICODIN) 5-325 MG per tablet 1-2 tablet, 1-2 tablet, Oral, Q4H PRN, Earnie Larsson, MD, 1 tablet at 08/24/21 2342   ondansetron (ZOFRAN) tablet 4 mg, 4 mg, Oral, Q6H PRN **OR** ondansetron (ZOFRAN) injection 4 mg, 4 mg, Intravenous, Q6H PRN, Earnie Larsson, MD   sodium chloride flush (NS) 0.9 % injection 3 mL, 3 mL, Intravenous, Q12H, Pool, Mallie Mussel, MD, 3 mL at 08/25/21 1030   sodium chloride flush (NS) 0.9 % injection  3 mL, 3 mL, Intravenous, Q12H, Pool, Mallie Mussel, MD, 3 mL at 08/25/21 1000   sodium chloride flush (NS) 0.9 % injection 3 mL, 3 mL, Intravenous, PRN, Earnie Larsson, MD   tamsulosin [facility]) capsule 0.4 mg, 0.4 mg, Oral, Daily, Bergman, Meghan D, NP, 0.4 mg at 08/25/21 1039  Exam: Current vital signs: BP (!) 142/92    Pulse 84    Temp 98.3 F (36.8 C) (Oral)    Resp 20    Ht 5' 10.5" (1.791 m)    Wt 74.8 kg    SpO2 96%    BMI 23.34 kg/m  Vital signs in last 24 hours: Temp:  [98 F (36.7 C)-98.5 F (36.9 C)] 98.3 F (36.8 C) (01/19 1510) Pulse Rate:  [73-96] 84 (01/19 1510) Resp:  [16-20] 20 (01/19 1510) BP: (129-173)/(76-97) 142/92 (01/19 1700) SpO2:  [95 %-98 %] 96 % (01/19 1510)  GENERAL: Awake, alert, agitated and yelling Psych: Unable to assess due to patient's mental status, patient is restless and agitated throughout assessment.  He is not cooperative with examination Head: Normocephalic and atraumatic, without obvious abnormality EENT: Normal conjunctivae, dry mucous membranes, no OP obstruction LUNGS: Normal respiratory effort. Non-labored breathing on room air CV: Regular rate and rhythm on telemetry ABDOMEN: Soft, non-tender, non-distended Extremities: Warm, well perfused, without obvious deformity  NEURO:  Mental Status: Awake, restless and agitated.  Patient yells "help" over and over while in CT scan and yells names rhyming with "Pam".  He does not follow commands.  He does not answer orientation questions. He is unable to provide a clear and coherent history of present illness. Speech/Language: speech is repetitive and mildly dysarthric He has not attempted to name objects, repeat phrases, or communicate in full sentences. No neglect is noted Cranial Nerves:  II: PERRL with limited visualization as patient strongly resists passive eye opening.  III, IV, VI: Patient will fixate and track examiner around the room V: Unable to assess facial sensation due to patient's  condition. VII: Face appears symmetric resting and smiling VIII: Hearing is intact to voice IX, X: Phonation normal.  XI: Head is grossly midline XII: Does not perform Motor: Patient strongly and spontaneously moves all extremities and strongly resists assessment with all extremities.  There is no asymmetry noted Sensation: Unable to assess sensation to light touch though patient increases restless movements of each extremity with manipulation. Coordination: Does not perform Gait: Deferred for patient safety  NIHSS: 1a Level of Conscious.: 0 1b LOC Questions: 2 1c LOC Commands: 2 2 Best Gaze: 0 3 Visual: 0 4 Facial Palsy: 0 5a Motor Arm - left: 2 5b Motor Arm - Right: 2 6a Motor Leg - Left: 2 6b Motor Leg - Right: 2 7 Limb Ataxia: 0 8 Sensory: 0 9 Best Language: 2 10 Dysarthria: 1 11 Extinct. and Inatten.: 0 TOTAL: 15  Labs I have reviewed labs in epic and the results pertinent to this consultation are: CBC    Component  Value Date/Time   WBC 10.8 (H) 08/23/2021 1447   RBC 4.93 08/23/2021 1447   HGB 15.2 08/23/2021 1447   HCT 46.2 08/23/2021 1447   PLT 196 08/23/2021 1447   MCV 93.7 08/23/2021 1447   MCH 30.8 08/23/2021 1447   MCHC 32.9 08/23/2021 1447   RDW 12.9 08/23/2021 1447   LYMPHSABS 2.3 08/23/2021 1447   MONOABS 0.6 08/23/2021 1447   EOSABS 0.5 08/23/2021 1447   BASOSABS 0.1 08/23/2021 1447   CMP     Component Value Date/Time   NA 134 (L) 08/23/2021 1447   K 3.7 08/23/2021 1447   CL 103 08/23/2021 1447   CO2 25 08/23/2021 1447   GLUCOSE 87 08/23/2021 1447   BUN 30 (H) 08/23/2021 1447   CREATININE 1.80 (H) 08/23/2021 1447   CALCIUM 9.0 08/23/2021 1447   CALCIUM 9.5 12/05/2012 0243   PROT 6.9 08/23/2021 1447   ALBUMIN 3.9 08/23/2021 1447   AST 23 08/23/2021 1447   ALT 15 08/23/2021 1447   ALKPHOS 69 08/23/2021 1447   BILITOT 2.2 (H) 08/23/2021 1447   GFRNONAA 35 (L) 08/23/2021 1447   GFRAA 38 (L) 05/07/2020 0941   Lipid Panel     Component  Value Date/Time   CHOL 194 05/07/2020 0417   TRIG 182 (H) 05/07/2020 0417   TRIG 317 (HH) 07/13/2006 1523   HDL 26 (L) 05/07/2020 0417   CHOLHDL 7.5 05/07/2020 0417   VLDL 36 05/07/2020 0417   LDLCALC 132 (H) 05/07/2020 0417   LDLDIRECT 89.0 12/05/2019 1044   Lab Results  Component Value Date   HGBA1C 6.0 (H) 05/07/2020   Imaging I have reviewed the images obtained:  CT-scan of the brain 1/19: 1. Evaluation is significantly limited by motion artifact, despite repeat imaging. Within this limitation, no acute intracranial process. Grossly unchanged size of a left subdural hematoma. 2. ASPECTS is 10  Assessment: 88 year old male with a recent admission on 1/17 for evaluation of altered mental status s/p mechanical fall from standing with imaging revealing acute subdural hemorrhage along the left parietal convexity.  Patient was stable and ready for discharge today when family there is that he was staring off and not answering questions as he would have baseline.  On bedside nurses evaluation, patient was unresponsive to noxious stimuli and had a fixed right upward gaze for which a code stroke was called. - Examination reveals patient is agitated, uncooperative with exam with restless movements of all extremities without noted asymmetry. -CT imaging obtained and within motion limited assessment, the subdural hematoma size was grossly unchanged. -Presentation is most concerning for potential seizure due to recent subdural hematoma with acute onset of unresponsiveness, gaze deviation, subsequent agitation, and unchanged subdural hematoma on imaging.  Patient was loaded with 2 g of Keppra with plans to initiate maintenance dosing.  Recommendations: - Keppra 2g IV loading dose given  - Initiate Keppra 500 mg BID IV or PO  - Seizure precautions - Routine EEG - Neurology will follow   - Discussed at length with family at bedside   Brendan Long, [place] [facility] Pager: [phone]  NEUROHOSPITALIST ADDENDUM Performed a face to face diagnostic evaluation.   I have reviewed the contents of history and physical exam as documented by PA/ARNP/Resident and agree with above documentation.  I have discussed and formulated the above plan as documented. Edits to the note have been made as needed.  Impression/Key exam findings/Plan: family came in to pick him up, found him off, starring past them  but he did tell them that he is constipated for a week. Shortly afterwards, unresponsive, eyes closed, diaphoretic. Was evaluated by RN and not responding to sternal rub, eyes deviated up and to the right. Stroke code was activated. He was confused, agitated on arrival. [facility] motion degraded but with stable SDH.  Likely had a seizure and is now confused and post ictal.  Recs: - Keppra 2G Iv once ordered. - Keppra 500mg  BID IV or PO ordered - routine EEG ordered. - observe overnight. - Can discharge if back to his baseline tomorrow AM. - Follow up with Neurology outpatient.  Paged the [place] [facility] service to let them know that our recs are in.  Brendan Simpers, MD [facility] [phone]   If 7pm to 7am, please call on call as listed on AMION.

## 2021-08-25 NOTE — TOC Transition Note (Signed)
Transition of Care [facility]) - CM/SW Discharge Note   Patient Details  Name: Brendan Long MRN: [id number] Date of Birth: 1929/04/24  [facility] of [facility]) CM/SW Contact:  Verdell Carmine, RN Phone Number: 08/25/2021, 2:23 PM   Clinical Narrative:    Called patient unable to speak to him. Called SO and daughter. Apparently daughter has been estranged from father and has now come back into the fold, she lives around the corner from him. SO lives 40 minutes a way, They are both very pleasant and ` due to [name] being confused at times, daughter is the contact for decisions. She requested a 4-5 start Home Health agency. Agreed with 3:1 and his walker is getting old. Called [employer] with these items for room delivery.  Daughter will be picking him up from the hospital, patient is DC no further needs, Alvis Lemmings accepted for  [facility]   Final next level of care: [place] Barriers to Discharge: No Barriers Identified   Patient Goals and CMS Choice        Discharge Placement                       Discharge Plan and Services   Discharge Planning Services: CM Consult            DME Arranged: Gilford Rile, 3-N-1 DME Agency: [employer] Date DME Agency Contacted: 08/25/21 Time DME Agency Contacted: [phone] Representative spoke with at DME Agency: [place]: PT, OT [place] Agency: [place] Date [place]: 08/25/21 Time [place]: [place] Representative spoke with at [place]: [place] ([place]) Interventions     Readmission Risk Interventions Readmission Risk Prevention Plan 03/20/2019  Post Dischage Appt Complete  Medication Screening Complete  Transportation Screening Complete  Some recent data might be hidden

## 2021-08-25 NOTE — Progress Notes (Signed)
°  Transition of Care [facility]) Screening Note   Patient Details  Name: Brendan Long Date of Birth: 1929/07/07   Transition of Care [facility]) CM/SW Contact:    Geralynn Ochs, LCSW Phone Number: 08/25/2021, 11:30 AM    Transition of Care Department [facility]) has reviewed patient and no TOC needs have been identified at this time. We will continue to monitor patient advancement through interdisciplinary progression rounds. If new patient transition needs arise, please place a TOC consult.

## 2021-08-25 NOTE — Progress Notes (Signed)
EEG complete - results pending 

## 2021-08-25 NOTE — Code Documentation (Addendum)
Stroke Response Nurse Documentation Code Documentation  Erique Kaser is a 88 y.o. male admitted to [facility] on 08/23/2021 for SDH with past medical hx of HLD, HTN, Stroke. On No antithrombotic. Code stroke was activated by 3W RN .   Patient from inpatient where he was LKW at 1300. Patient was to be discharged to home today and was sitting up in recliner during event. Patient was talking to family when he went unresponsive. RN entered room to find patient with right upward gaze initially. Upon SRN arrival, patient gaze midline, diaphoretic, alert though confused, garbled speech, and not following commands.  Stroke team at the bedside after patient activation. Patient to CT with team. NIHSS 15, see documentation for details and code stroke times. Patient with disoriented, not following commands, bilateral arm weakness, bilateral leg weakness, Global aphasia , and dysarthria  on exam.   The following imaging was completed: CT. Patient is not a candidate for IV Thrombolytic due to SDH. Not a candidate for IR thrombectomy due to no LVO.  Care/Plan: 2g keppra IVPB. Primary team made aware of event via Tharon Aquas NP.   Bedside handoff with RN Gaspar Bidding.    Candace Cruise K  Stroke Response RN

## 2021-08-25 NOTE — Evaluation (Signed)
Occupational Therapy Evaluation Patient Details Name: Brendan Long MRN: [id number] DOB: 1928-09-10 Today's Date: 08/25/2021   History of Present Illness pt is a 88 y/o male admitted 1/17  s/p fall from standing height sustaning small L SDH with no mass effect.  HTN, Stroke, THR's, rotator cuff repair R shd,   Clinical Impression   Pt admitted for concerns listed above. PTA pt reported that he was independent with all ADL's and IADL's, including driving. At this time, pt presenting with cognitive deficits, impacting higher level cognitive tasks such as cooking and medication management, due to deficits in attention, awareness, recall, and multistep command following. Additionally, pt presenting with balance deficits and weakness, requiring increased physical assist throughout session. Recommending AIR at this time to maximize independence and safety prior to returning home. OT will follow acutely.       Recommendations for follow up therapy are one component of a multi-disciplinary discharge planning process, led by the attending physician.  Recommendations may be updated based on patient status, additional functional criteria and insurance authorization.   Follow Up Recommendations  Acute inpatient rehab (3hours/day)    Assistance Recommended at Discharge Frequent or constant Supervision/Assistance  Patient can return home with the following A little help with walking and/or transfers;A little help with bathing/dressing/bathroom;Direct supervision/assist for financial management;Direct supervision/assist for medications management;Assistance with cooking/housework    Functional Status Assessment  Patient has had a recent decline in their functional status and demonstrates the ability to make significant improvements in function in a reasonable and predictable amount of time.  Equipment Recommendations  Other (comment) (RW)    Recommendations for Other Services Rehab consult      Precautions / Restrictions Precautions Precautions: Fall Restrictions Weight Bearing Restrictions: No      Mobility Bed Mobility Overal bed mobility: Needs Assistance Bed Mobility: Rolling, Sidelying to Sit Rolling: Supervision Sidelying to sit: Min assist, HOB elevated       General bed mobility comments: Needed assist to power up to sitting    Transfers Overall transfer level: Needs assistance Equipment used: Rolling walker (2 wheels) Transfers: Sit to/from Stand Sit to Stand: Min assist           General transfer comment: Min A to power up and steady      Balance Overall balance assessment: Needs assistance Sitting-balance support: Single extremity supported, Feet supported Sitting balance-Leahy Scale: Fair     Standing balance support: Bilateral upper extremity supported, Reliant on assistive device for balance Standing balance-Leahy Scale: Poor                             ADL either performed or assessed with clinical judgement   ADL Overall ADL's : Needs assistance/impaired Eating/Feeding: Set up;Sitting   Grooming: Minimal assistance;Cueing for sequencing;Sitting   Upper Body Bathing: Minimal assistance;Sitting;Cueing for sequencing   Lower Body Bathing: Minimal assistance;Sitting/lateral leans;Sit to/from stand;Cueing for sequencing   Upper Body Dressing : Min guard;Sitting;Cueing for sequencing   Lower Body Dressing: Minimal assistance;Cueing for sequencing;Sit to/from stand;Sitting/lateral leans   Toilet Transfer: Minimal assistance;Ambulation;Cueing for safety   Toileting- Clothing Manipulation and Hygiene: Minimal assistance;Cueing for sequencing;Sitting/lateral lean;Sit to/from stand       Functional mobility during ADLs: Minimal assistance;Rolling walker (2 wheels) General ADL Comments: Pt needing increased verbal cuing for sequncing and safety     Vision Baseline Vision/History: 0 No visual deficits Ability to See in  Adequate Light: 0 Adequate Patient Visual Report: No  change from baseline Vision Assessment?: No apparent visual deficits     Perception     Praxis      Pertinent Vitals/Pain Pain Assessment Pain Assessment: Faces Faces Pain Scale: Hurts a little bit Pain Location: general Pain Descriptors / Indicators: Discomfort, Guarding Pain Intervention(s): Monitored during session, Repositioned     Hand Dominance Right   Extremity/Trunk Assessment Upper Extremity Assessment Upper Extremity Assessment: Generalized weakness   Lower Extremity Assessment Lower Extremity Assessment: Defer to PT evaluation   Cervical / Trunk Assessment Cervical / Trunk Assessment: Kyphotic   Communication Communication Communication: No difficulties   Cognition Arousal/Alertness: Awake/alert Behavior During Therapy: WFL for tasks assessed/performed Overall Cognitive Status: No family/caregiver present to determine baseline cognitive functioning                                 General Comments: Decreased safety awareness, poor attention, perseverating on his phone stating it wasnt his because he couldnt work it/turn it on, difficulty following multistep commands/tasks     General Comments  VSS on RA    Exercises     Shoulder Instructions      Home Living Family/patient expects to be discharged to:: Private residence Living Arrangements: Alone Available Help at Discharge: Family;Available 24 hours/day;Available PRN/intermittently (for short time period) Type of Home: House Home Access: Stairs to enter [employer] of Steps: 2 steps   Home Layout: One level     Bathroom Shower/Tub: Occupational psychologist: Standard     Home Equipment: Chartered certified accountant;Shower seat - built in   Additional Comments: States that daughter can stay wtih him whenhe returns home      Prior Functioning/Environment Prior Level of Function : Independent/Modified  Independent;Driving                        OT Problem List: Decreased strength;Decreased activity tolerance;Impaired balance (sitting and/or standing);Decreased coordination;Decreased cognition;Decreased safety awareness;Decreased knowledge of use of DME or AE      OT Treatment/Interventions: Therapeutic exercise;Self-care/ADL training;Energy conservation;DME and/or AE instruction;Therapeutic activities;Cognitive remediation/compensation;Patient/family education;Balance training    OT Goals(Current goals can be found in the care plan section) Acute Rehab OT Goals Patient Stated Goal: To go home OT Goal Formulation: With patient Time For Goal Achievement: 09/08/21 Potential to Achieve Goals: Good ADL Goals Pt Will Perform Lower Body Bathing: with modified independence;sitting/lateral leans;sit to/from stand Pt Will Perform Lower Body Dressing: with modified independence;sitting/lateral leans;sit to/from stand Pt Will Transfer to Toilet: with modified independence;ambulating Pt Will Perform Toileting - Clothing Manipulation and hygiene: with modified independence;sitting/lateral leans;sit to/from stand Additional ADL Goal #1: Pt will complete a multistep pathfinding task with no assist Additional ADL Goal #2: Pt will complete a medication managment task with no errors.  OT Frequency: Min 2X/week    Co-evaluation              AM-PAC OT "6 Clicks" Daily Activity     Outcome Measure Help from another person eating meals?: A Little Help from another person taking care of personal grooming?: A Little Help from another person toileting, which includes using toliet, bedpan, or urinal?: A Lot Help from another person bathing (including washing, rinsing, drying)?: A Lot Help from another person to put on and taking off regular upper body clothing?: A Little Help from another person to put on and taking off regular lower body clothing?: A Lot 6 Click Score:  15   End of Session  Equipment Utilized During Treatment: Gait belt;Rolling walker (2 wheels) Nurse Communication: Mobility status  Activity Tolerance: Patient tolerated treatment well Patient left: in chair;with call bell/phone within reach;with chair alarm set;with nursing/sitter in room  OT Visit Diagnosis: Unsteadiness on feet (R26.81);Other abnormalities of gait and mobility (R26.89);Muscle weakness (generalized) (M62.81)                Time: 4739-5844 OT Time Calculation (min): 30 min Charges:  OT General Charges $OT Visit: 1 Visit OT Evaluation $OT Eval Moderate Complexity: 1 Mod OT Treatments $Therapeutic Activity: 8-22 mins  Jade Burkard H., OTR/L Acute Rehabilitation  Romone Shaff Elane Sha Burling 08/25/2021, 2:07 PM

## 2021-08-26 DIAGNOSIS — S065XAA Traumatic subdural hemorrhage with loss of consciousness status unknown, initial encounter: Secondary | ICD-10-CM | POA: Diagnosis not present

## 2021-08-26 MED ORDER — LEVETIRACETAM 500 MG PO TABS
500.0000 mg | ORAL_TABLET | Freq: Two times a day (BID) | ORAL | Status: DC
Start: 2021-08-26 — End: 2021-08-30
  Administered 2021-08-26 – 2021-08-30 (×9): 500 mg via ORAL
  Filled 2021-08-26 (×9): qty 1

## 2021-08-26 NOTE — Procedures (Signed)
EEG - 08/25/2021  Indication - AMS  Technical: This is a digitally recorded electroencephalogram. The international 10-20 electrode placement system is used for scalp electrode placement. Eighteen channels of scalp EEG are recorded The data are stored digitally and reviewed in reformatted montages for optimal display. The patient is drowsy during the 43 minute EEG.   Background of 5-6 Hz activity. No focal slowing was seen. No seizure like activity was observed during this recording.   Impression: This EEG is abnormal. Diffuse slowing is seen, suggestive of a diffuse abnormality of the brain. This finding is nonspecific, and can be seen in a diffuse or multifocal abnormality of the brain. Clinical correlation is needed. The absence of interictal epileptiform abnormalities does not exclude the diagnosis of a seizure disorder

## 2021-08-26 NOTE — Progress Notes (Signed)
Patient resting comfortably at this time.  Patient refuses CPAP therapy for the night.  Patient was advised that a machine will be provided for him if he changes his mind.

## 2021-08-26 NOTE — Progress Notes (Signed)
Neurology Progress Note  Event 08/25/21:  About 1630 hours, Patient began to have garbled speech, layed his head to the side, became diaphoretic and was unresponsive. Bedside nurse found patient to be diaphoretic and nonresponsive to sternal rub so he assisted the patient back into the bed.  When the nurse opened the patient's eyes he noticed that his gaze was fixed rightward and upward with a limp limbs and a code stroke was activated.  There was no overt seizure activity noted by family or bedside nurse. A code stroke was called and imaging negative for acute findings. Incident was felt to be more consistent with seizure.   NIHSS score was 15. Score had reduced to 5 at 1730 09/04/21.   O: Current vital signs: BP (!) 152/87 (BP Location: Left Arm)    Pulse 86    Temp 98.2 F (36.8 C) (Oral)    Resp 16    Ht 5' 10.5" (1.791 m)    Wt 74.8 kg    SpO2 98%    BMI 23.34 kg/m  Vital signs in last 24 hours: Temp:  [98.2 F (36.8 C)-98.5 F (36.9 C)] 98.2 F (36.8 C) (01/20 0328) Pulse Rate:  [79-100] 86 (01/20 0328) Resp:  [16-20] 16 (01/20 0328) BP: (122-169)/(69-97) 152/87 (01/20 0328) SpO2:  [95 %-98 %] 98 % (01/20 0328)  GENERAL: Fairly well appearing male. Awake, alert in NAD. HEENT: Normocephalic and atraumatic. LUNGS: Normal respiratory effort.  CV: RRR on tele.  Ext: warm.  NEURO:  Mental Status: Alert and oriented except to day and date.  Speech/Language: speech is without aphasia or dysarthria, but is slowed. Converses appropriately. Naming, repetition, fluency, and comprehension intact.  Cranial Nerves:  Conservation officer, nature. Sensation is intact to face and extremities. Smile is symmetrical. Hearing intact to voice.  MOE x 4 spontaneously and strength is 5/5 throughout. No jerking, clonus, shaking, or tremor noted.   Medications  Current Facility-Administered Medications:    0.9 %  sodium chloride infusion, 250 mL, Intravenous, PRN, Earnie Larsson, MD   acetaminophen (TYLENOL)  tablet 650 mg, 650 mg, Oral, Q6H PRN, 650 mg at 08/23/21 2218 **OR** acetaminophen (TYLENOL) suppository 650 mg, 650 mg, Rectal, Q6H PRN, Earnie Larsson, MD   Chlorhexidine Gluconate Cloth 2 % PADS 6 each, 6 each, Topical, Daily, Pool, Mallie Mussel, MD, 6 each at 08/25/21 0910   docusate sodium (COLACE) capsule 100 mg, 100 mg, Oral, BID, Earnie Larsson, MD, 100 mg at 08/25/21 2153   hydrALAZINE (APRESOLINE) injection 20 mg, 20 mg, Intravenous, Q4H PRN, Earnie Larsson, MD, 20 mg at 08/25/21 2104   HYDROcodone-acetaminophen (NORCO/VICODIN) 5-325 MG per tablet 1-2 tablet, 1-2 tablet, Oral, Q4H PRN, Earnie Larsson, MD, 1 tablet at 08/26/21 0306   ondansetron (ZOFRAN) tablet 4 mg, 4 mg, Oral, Q6H PRN **OR** ondansetron (ZOFRAN) injection 4 mg, 4 mg, Intravenous, Q6H PRN, Pool, Mallie Mussel, MD   sodium chloride flush (NS) 0.9 % injection 3 mL, 3 mL, Intravenous, Q12H, Pool, Henry, MD, 3 mL at 08/25/21 2203   sodium chloride flush (NS) 0.9 % injection 3 mL, 3 mL, Intravenous, Q12H, Pool, Henry, MD, 3 mL at 08/25/21 1000   sodium chloride flush (NS) 0.9 % injection 3 mL, 3 mL, Intravenous, PRN, Earnie Larsson, MD   tamsulosin [facility]) capsule 0.4 mg, 0.4 mg, Oral, Daily, Bergman, Meghan D, NP, 0.4 mg at 08/25/21 1039  Imaging MD has reviewed images in epic and the results pertinent to this consultation are:  CT Head 08/25/21 -Evaluation is significantly limited by motion  artifact, despite repeat imaging. Within this limitation, no acute intracranial process. Grossly unchanged size of a left subdural hematoma. ASPECTS is 10  EEG 08/26/21 This EEG is abnormal. Diffuse slowing is seen, suggestive of a diffuse abnormality of the brain. This finding is nonspecific, and can be seen in a diffuse or multifocal abnormality of the brain. Clinical correlation is needed. The absence of interictal epileptiform does not exclude the diagnosis of a seizure disorder  Assessment: 88 year old male with a recent admission on 1/17 for evaluation of  altered mental status s/p mechanical fall and was found to have a small left SDH. He was to be discharged yesterday when event described above occurred. He was kept overnight for observation after suspected seizure activity He was started on Keppra with load, then maintenance.   Impression: -recent small SDH.  -Concern for seizure activity yesterday thought to be secondary to SDH.   Recommendations/Plan:  -Continue Keppra 500mg  po q12 hours here and on discharge.  -NP placed ambulatory neurology f/up in 4 weeks.  -OK to discharge today given return to baseline.   Neurology will be available prn for questions or concerns.   Pt seen by Clance Boll, MSN, APN-BC/Nurse Practitioner/Neuro and later by MD. Note and plan to be edited as needed by MD.  Pager: [phone]

## 2021-08-26 NOTE — Progress Notes (Signed)
Providing Compassionate, Quality Care - Together   Subjective: Patient reports he is ready to go to inpatient rehab. Patient appears to be have returned to his baseline upon assessment today.  Objective: Vital signs in last 24 hours: Temp:  [97.8 F (36.6 C)-98.5 F (36.9 C)] 98 F (36.7 C) (01/20 1143) Pulse Rate:  [78-100] 80 (01/20 1143) Resp:  [16-20] 17 (01/20 1143) BP: (122-169)/(69-104) 157/104 (01/20 1143) SpO2:  [96 %-100 %] 99 % (01/20 1143)  Intake/Output from previous day: 01/19 0701 - 01/20 0700 In: 633 [P.O.:630; I.V.:3] Out: 100 [Urine:100] Intake/Output this shift: Total I/O In: 240 [P.O.:240] Out: 400 [Urine:400]  Responds to voice, oriented to person, time, and situation, disoriented to place (stated Elvina Sidle) PERRLA Speech clear CN II-XII grossly intact MAE, Sensation intact, generalized weakness   Lab Results: Recent Labs    08/23/21 1447  WBC 10.8*  HGB 15.2  HCT 46.2  PLT 196   BMET Recent Labs    08/23/21 1447  NA 134*  K 3.7  CL 103  CO2 25  GLUCOSE 87  BUN 30*  CREATININE 1.80*  CALCIUM 9.0    Studies/Results: EEG adult  Result Date: 08/26/2021 Clabe Seal, MD     08/26/2021  8:25 AM EEG - 08/25/2021 Indication - AMS Technical: This is a digitally recorded electroencephalogram. The international 10-20 electrode placement system is used for scalp electrode placement. Eighteen channels of scalp EEG are recorded The data are stored digitally and reviewed in reformatted montages for optimal display. The patient is drowsy during the 43 minute EEG. Background of 5-6 Hz activity. No focal slowing was seen. No seizure like activity was observed during this recording. Impression: This EEG is abnormal. Diffuse slowing is seen, suggestive of a diffuse abnormality of the brain. This finding is nonspecific, and can be seen in a diffuse or multifocal abnormality of the brain. Clinical correlation is needed. The absence of interictal  epileptiform abnormalities does not exclude the diagnosis of a seizure disorder   CT HEAD CODE STROKE WO CONTRAST  Result Date: 08/25/2021 CLINICAL DATA:  Code stroke.  Nonresponsive, fixed gaze EXAM: CT HEAD WITHOUT CONTRAST TECHNIQUE: Contiguous axial images were obtained from the base of the skull through the vertex without intravenous contrast. RADIATION DOSE REDUCTION: This exam was performed according to the departmental dose-optimization program which includes automated exposure control, adjustment of the mA and/or kV according to patient size and/or use of iterative reconstruction technique. COMPARISON:  08/24/2021. FINDINGS: Evaluation is limited by motion artifact, despite repeat imaging. Brain: Left subdural hematoma is again noted, which appears unchanged, although evaluation is limited by motion. No new extra-axial collection. Redemonstrated remote infarcts in the right occipital lobe and right cerebellum. No definite new cortical infarct. No acute parenchymal hemorrhage, mass, mass effect, or significant midline shift. Unchanged size and configuration of the ventricles. Vascular: No definite hyperdense vessel. Skull: No definite acute fracture or focal lesion. Sinuses/Orbits: Mucosal thickening throughout the paranasal sinuses. The orbits are grossly unremarkable. Other: None. ASPECTS [facility] Stroke Program Early CT Score) - Ganglionic level infarction (caudate, lentiform nuclei, internal capsule, insula, M1-M3 cortex): 7 - Supraganglionic infarction (M4-M6 cortex): 3 Total score (0-10 with 10 being normal): 10 IMPRESSION: 1. Evaluation is significantly limited by motion artifact, despite repeat imaging. Within this limitation, no acute intracranial process. Grossly unchanged size of a left subdural hematoma. 2. ASPECTS is 10 Code stroke imaging results were communicated on 08/25/2021 at 5:03 pm to provider Dr. Lorrin Goodell via secure text paging.  Electronically Signed   By: Merilyn Baba M.D.   On:  08/25/2021 17:04    Assessment/Plan: Patient suffered a fall from ground level on 08/23/2021, sustaining a small subdural hematoma. He was admitted to 4N ICU for observation. Follow up imaging performed on 08/24/2021 was stable, showing a small volume left convexity subdural hematoma without mass-effect. He developed urinary retention. An indwelling catheter had to be placed by Urology and the patient was started on Flomax. Patient was to follow up with urology in 7 days. Patient was to discharge on 08/25/2021, but had what appears to be a seizure and discharge was canceled. Therapies now feel that patient would do better with AIR prior to discharging home.    LOS: 3 days   -Plan is CIR at discharge -Continue to mobilize patient -Arrangements will need to be made for urology to follow patient when he goes to [place], DNP, AGNP-C Nurse Practitioner  [facility] Negron Torres. [address], [address], [place], [place] [address] P: [phone]     F: [phone]  08/26/2021, 1:08 PM

## 2021-08-26 NOTE — TOC Progression Note (Signed)
Transition of Care [facility]) - Progression Note    Patient Details  Name: Tsugio Elison MRN: [id number] Date of Birth: Jan 16, 1929  [facility] CM/SW Contact  Pollie Friar, RN Phone Number: 08/26/2021, 12:21 PM  Clinical Narrative:    Pt didn't do as well with therapy today. They are recommending CIR. CM spoke to patients daughter and she is in agreement. CM has updated CIR.  TOC following.   Expected Discharge Plan: IP Rehab Facility Barriers to Discharge: Continued Medical Work up  Expected Discharge Plan and Services Expected Discharge Plan: [place]   Discharge Planning Services: CM Consult     Expected Discharge Date: 08/25/21               DME Arranged: Gilford Rile, 3-N-1 DME Agency: [employer] Date DME Agency Contacted: 08/25/21 Time DME Agency Contacted: [phone] Representative spoke with at DME Agency: [place]: PT, OT [place] Agency: [place] Date [place]: 08/25/21 Time [place]: [place] Representative spoke with at [place]: [place] ([place]) Interventions    Readmission Risk Interventions Readmission Risk Prevention Plan 03/20/2019  Post Dischage Appt Complete  Medication Screening Complete  Transportation Screening Complete  Some recent data might be hidden

## 2021-08-26 NOTE — Progress Notes (Signed)
Occupational Therapy Treatment Patient Details Name: Brendan Long MRN: [id number] DOB: 1929/03/30 Today's Date: 08/26/2021   History of present illness 88 y/o male admitted 1/17  s/p fall from standing height sustaning small L SDH with no mass effect. 1/20 unresponsive with rapid repsonse called noted to have abnormal EEG PMH HTN, Stroke, BIL THR's, rotator cuff repair R shoulder   OT comments  Pt currently demonstrates cognitive and balance deficits that could impact safety at home. Pt is not safe to live alone at this time based on this session. Pt was unable to go from bed to bathroom and return to the bed due to inability to locate the bed. Pt with path finding deficits in this very small space and short time frame. Recommendation for CIR to help with progression toward LTG of return home.   No family present. Spoke with Transitional care and they are to call family to inquire about (A) at d/c.    Recommendations for follow up therapy are one component of a multi-disciplinary discharge planning process, led by the attending physician.  Recommendations may be updated based on patient status, additional functional criteria and insurance authorization.    Follow Up Recommendations  Acute inpatient rehab (3hours/day)    Assistance Recommended at Discharge Frequent or constant Supervision/Assistance  Patient can return home with the following  A little help with walking and/or transfers;A little help with bathing/dressing/bathroom;Direct supervision/assist for financial management;Direct supervision/assist for medications management;Assistance with cooking/housework;Assist for transportation   Equipment Recommendations  Other (comment) (RW)    Recommendations for Other Services Rehab consult    Precautions / Restrictions Precautions Precautions: Fall Restrictions Weight Bearing Restrictions: No       Mobility Bed Mobility Overal bed mobility: Needs Assistance Bed Mobility:  Supine to Sit Rolling: Min guard   Supine to sit: Min guard     General bed mobility comments: pt requires increased HOB 40 degrees and increased time and multiple attempts to sit eob.    Transfers Overall transfer level: Needs assistance Equipment used: Rolling walker (2 wheels) Transfers: Sit to/from Stand Sit to Stand: Min guard           General transfer comment: multiple attempts to power up into standing. pt once standing needing min (A) to steady     Balance Overall balance assessment: Needs assistance Sitting-balance support: Bilateral upper extremity supported, Feet supported Sitting balance-Leahy Scale: Fair     Standing balance support: Bilateral upper extremity supported, During functional activity, Reliant on assistive device for balance Standing balance-Leahy Scale: Fair                             ADL either performed or assessed with clinical judgement   ADL Overall ADL's : Needs assistance/impaired Eating/Feeding: Set up;Sitting   Grooming: Minimal assistance;Standing   Upper Body Bathing: Minimal assistance;Sitting   Lower Body Bathing: Moderate assistance;Sit to/from stand   Upper Body Dressing : Minimal assistance;Sitting Upper Body Dressing Details (indicate cue type and reason): removing the R arm from his gown multiple times during session Lower Body Dressing: Moderate assistance;Sit to/from stand   Toilet Transfer: Moderate assistance;Ambulation;Rolling walker (2 wheels);Regular Glass blower/designer Details (indicate cue type and reason): needed (A) to descend to commode with multipel cues to sit Toileting- Clothing Manipulation and Hygiene: Minimal assistance Toileting - Clothing Manipulation Details (indicate cue type and reason): noted to have some blood in toilet. wiping with some blood on butt area. uncertain if  blood is rectal or if it could be from the foley trauma and due to gravity in that area     Functional mobility  during ADLs: Minimal assistance;Rolling walker (2 wheels) General ADL Comments: Pt progressed from bed to bathroom with alot of cues for safety with RW    Extremity/Trunk Assessment Upper Extremity Assessment Upper Extremity Assessment: Generalized weakness   Lower Extremity Assessment Lower Extremity Assessment: Generalized weakness        Vision   Vision Assessment?: No apparent visual deficits   Perception     Praxis      Cognition Arousal/Alertness: Awake/alert Behavior During Therapy: Impulsive Overall Cognitive Status: Impaired/Different from baseline Area of Impairment: Orientation, Attention, Memory, Following commands, Safety/judgement, Awareness, Problem solving                 Orientation Level: Disoriented to, Situation Current Attention Level: Selective Memory: Decreased recall of precautions, Decreased short-term memory Following Commands: Follows one step commands with increased time Safety/Judgement: Decreased awareness of safety, Decreased awareness of deficits Awareness: Intellectual Problem Solving: Slow processing, Difficulty sequencing General Comments: Pt reorts that he needs to go pee and lacks complete awarenes to foley placed. pt noted to have some trauma to the end of his penis with foley inert ( question if pulling has been an issue prior this admission). pt insistent on walking to the bathroom with cues to don socks prior and foley in place. pt needs cues for RW use. pt atempting to exit the room to "lay in the bed" pt unaware how to locate the bed that he had just exited. Unable to go from bed to bathroom back to bed due to cognitive deficits.        Exercises      Shoulder Instructions       General Comments noted to have blood at the foley site    Pertinent Vitals/ Pain       Pain Assessment Pain Assessment: No/denies pain  Home Living                                          Prior Functioning/Environment               Frequency  Min 2X/week        Progress Toward Goals  OT Goals(current goals can now be found in the care plan section)  Progress towards OT goals: Progressing toward goals  Acute Rehab OT Goals Patient Stated Goal: none stated - pt states "getting old is hell" OT Goal Formulation: With patient Time For Goal Achievement: 09/08/21 Potential to Achieve Goals: Good ADL Goals Pt Will Perform Lower Body Bathing: with modified independence;sitting/lateral leans;sit to/from stand Pt Will Perform Lower Body Dressing: with modified independence;sitting/lateral leans;sit to/from stand Pt Will Transfer to Toilet: with modified independence;ambulating Pt Will Perform Toileting - Clothing Manipulation and hygiene: with modified independence;sitting/lateral leans;sit to/from stand Additional ADL Goal #1: Pt will complete a multistep pathfinding task with no assist Additional ADL Goal #2: Pt will complete a medication managment task with no errors.  Plan Discharge plan remains appropriate    Co-evaluation                 AM-PAC OT "6 Clicks" Daily Activity     Outcome Measure   Help from another person eating meals?: A Little Help from another person taking care of personal grooming?: A  Little Help from another person toileting, which includes using toliet, bedpan, or urinal?: A Lot Help from another person bathing (including washing, rinsing, drying)?: A Lot Help from another person to put on and taking off regular upper body clothing?: A Little Help from another person to put on and taking off regular lower body clothing?: A Lot 6 Click Score: 15    End of Session Equipment Utilized During Treatment: Rolling walker (2 wheels)  OT Visit Diagnosis: Unsteadiness on feet (R26.81);Other abnormalities of gait and mobility (R26.89);Muscle weakness (generalized) (M62.81)   Activity Tolerance Patient tolerated treatment well   Patient Left in bed;with call bell/phone  within reach;with bed alarm set   Nurse Communication Mobility status        Time: 1975-8832 OT Time Calculation (min): 19 min  Charges: OT General Charges $OT Visit: 1 Visit OT Treatments $Self Care/Home Management : 8-22 mins   Brynn, OTR/L  Acute Rehabilitation Services Pager: [phone] Office: [phone] .   Jeri Modena 08/26/2021, 11:22 AM

## 2021-08-27 NOTE — Progress Notes (Addendum)
Assisted patient with calling significant other on Hospital phone due to cell phone not having mobile data. Patient very confused, arguing with staff, wanting to leave, stating we are not feeding him, and requesting doctor to speak to him now. Patient alert and oriented to place and self, but not time or situation. Patient is very argumentative and difficult to console. Patient states we are not feeding him, though the nurse tech sat and assisted him with his dinner at the beginning of the shift. Will continue to orient and assist PRN.

## 2021-08-27 NOTE — Progress Notes (Signed)
° °  Providing Compassionate, Quality Care - Together  NEUROSURGERY PROGRESS NOTE   S: No issues overnight. Complaining about room, ready for rehab  O: EXAM:  BP (!) 171/95 (BP Location: Left Arm)    Pulse 84    Temp 98.3 F (36.8 C) (Oral)    Resp 18    Ht 5' 10.5" (1.791 m)    Wt 74.8 kg    SpO2 95%    BMI 23.34 kg/m   Awake, alert, oriented x3 PERRL Speech fluent, appropriate  CNs grossly intact  MAE equally  ASSESSMENT:  88 y.o. male with   SDH Sz  PLAN: - rehab pending - cont AEDs    Thank you for allowing me to participate in this patient's care.  Please do not hesitate to call with questions or concerns.   Elwin Sleight, [facility] Cell: [phone]

## 2021-08-27 NOTE — Progress Notes (Signed)
Physical Therapy Treatment Patient Details Name: Brendan Long MRN: [id number] DOB: [birth year] Today's Date: 08/27/2021   History of Present Illness 88 y/o male admitted 1/17  s/p fall from standing height sustaning small L SDH with no mass effect. 1/20 unresponsive with rapid repsonse called noted to have abnormal EEG PMH HTN, Stroke, BIL THR's, rotator cuff repair R shoulder.    PT Comments    Pt received in supine, agreeable to therapy session and with good participation and tolerance for gait and transfer training. Pt with noted cognitive deficits and difficulty managing RW safety and difficulty wayfinding. Pt needing up to minA for safety with gait and heavy safety cues for each functional task. Pt continues to benefit from PT services to progress toward functional mobility goals.    Recommendations for follow up therapy are one component of a multi-disciplinary discharge planning process, led by the attending physician.  Recommendations may be updated based on patient status, additional functional criteria and insurance authorization.  Follow Up Recommendations  Acute inpatient rehab (3hours/day)     Assistance Recommended at Discharge Frequent or constant Supervision/Assistance  Patient can return home with the following A little help with walking and/or transfers;A little help with bathing/dressing/bathroom;Assistance with cooking/housework;Assist for transportation;Direct supervision/assist for medications management;Direct supervision/assist for financial management;Help with stairs or ramp for entrance   Equipment Recommendations  Rolling walker (2 wheels)    Recommendations for Other Services Rehab consult     Precautions / Restrictions Precautions Precautions: Fall Restrictions Weight Bearing Restrictions: No     Mobility  Bed Mobility               General bed mobility comments: received in chair    Transfers Overall transfer level: Needs  assistance Equipment used: Rolling walker (2 wheels) Transfers: Sit to/from Stand Sit to Stand: Min guard           General transfer comment: from chair x4 reps    Ambulation/Gait Ambulation/Gait assistance: Min guard, Min assist Gait Distance (Feet): 100 Feet Assistive device: Rolling walker (2 wheels), Straight cane Gait Pattern/deviations: Step-through pattern       General Gait Details: Pt holding RW too far advanced, given cues for RW proximity but only briefly remembers then keeps it too far advanced again. up to minA needed for stability when turning and for RW management. Pt with difficulty wayfinding to locate room upon return, see cognition.   Stairs             Wheelchair Mobility    Modified Rankin (Stroke Patients Only)       Balance Overall balance assessment: Needs assistance Sitting-balance support: Bilateral upper extremity supported, Feet supported Sitting balance-Leahy Scale: Fair     Standing balance support: Bilateral upper extremity supported, During functional activity, Reliant on assistive device for balance Standing balance-Leahy Scale: Poor Standing balance comment: RW needed for dynamic standing                            Cognition Arousal/Alertness: Awake/alert Behavior During Therapy: Impulsive Overall Cognitive Status: Impaired/Different from baseline Area of Impairment: Orientation, Attention, Memory, Following commands, Safety/judgement, Awareness, Problem solving                 Orientation Level: Disoriented to, Situation Current Attention Level: Selective Memory: Decreased recall of precautions, Decreased short-term memory Following Commands: Follows one step commands with increased time Safety/Judgement: Decreased awareness of safety, Decreased awareness of deficits Awareness: Intellectual Problem Solving:  Slow processing, Difficulty sequencing General Comments: Pt with noted difficulty wayfinding,  when given his room number to find he walked right past it then to end of hallway, turned around then was able to locate it with cues. Slow processing, decreased safety awareness, pt not understanding cues for proximity to RW despite lengthy discussion.        Exercises      General Comments General comments (skin integrity, edema, etc.): pt with dark urine in foley, nursing staff aware      Pertinent Vitals/Pain Pain Assessment Pain Assessment: No/denies pain     PT Goals (current goals can now be found in the care plan section) Acute Rehab PT Goals Patient Stated Goal: I want to get home PT Goal Formulation: With patient Time For Goal Achievement: 09/01/21 Progress towards PT goals: Progressing toward goals    Frequency    Min 3X/week      PT Plan Current plan remains appropriate       AM-PAC PT "6 Clicks" Mobility   Outcome Measure  Help needed turning from your back to your side while in a flat bed without using bedrails?: A Little Help needed moving from lying on your back to sitting on the side of a flat bed without using bedrails?: A Little Help needed moving to and from a bed to a chair (including a wheelchair)?: A Little Help needed standing up from a chair using your arms (e.g., wheelchair or bedside chair)?: A Little Help needed to walk in hospital room?: A Lot (mod cues) Help needed climbing 3-5 steps with a railing? : A Lot (mod cues) 6 Click Score: 16    End of Session Equipment Utilized During Treatment: Gait belt Activity Tolerance: Patient tolerated treatment well Patient left: in chair;with call bell/phone within reach;with chair alarm set Nurse Communication: Mobility status PT Visit Diagnosis: Other abnormalities of gait and mobility (R26.89);Muscle weakness (generalized) (M62.81);Difficulty in walking, not elsewhere classified (R26.2)     Time: 2595-6387 PT Time Calculation (min) (ACUTE ONLY): 16 min  Charges:  $Gait Training: 8-22  mins                     Derreon Consalvo P., PTA Acute Rehabilitation Services Pager: [phone] Office: [place] 08/27/2021, 12:32 PM

## 2021-08-27 NOTE — Progress Notes (Signed)
Inpatient Rehab Admissions Coordinator:   I spoke with Pt. Regarding potential CIR admit. He is interested and states he will have at least intermittent support at discharge. I will follow for potential admit and reach out to his significant other to confirm support.   Clemens Catholic, [place], [place] Admissions Coordinator  [phone] ([place]) [phone] (office)

## 2021-08-28 ENCOUNTER — Inpatient Hospital Stay (HOSPITAL_COMMUNITY): Payer: Medicare Other

## 2021-08-28 DIAGNOSIS — S065XAA Traumatic subdural hemorrhage with loss of consciousness status unknown, initial encounter: Secondary | ICD-10-CM | POA: Diagnosis not present

## 2021-08-28 LAB — GLUCOSE, CAPILLARY: Glucose-Capillary: 118 mg/dL — ABNORMAL HIGH (ref 70–99)

## 2021-08-28 MED ORDER — ALUM & MAG HYDROXIDE-SIMETH 200-200-20 MG/5ML PO SUSP
30.0000 mL | ORAL | Status: DC | PRN
  Administered 2021-08-28 – 2021-08-30 (×3): 30 mL via ORAL
  Filled 2021-08-28 (×3): qty 30

## 2021-08-28 NOTE — PMR Pre-admission (Shared)
PMR Admission Coordinator Pre-Admission Assessment  Patient: Brendan Long is an 88 y.o., male MRN: [id number] DOB: 06/26/1929 Height: 5' 10.5" (179.1 cm) Weight: 74.8 kg  Insurance Information HMO:     PPO:      PCP:      IPA:      80/20: yes      OTHER:  PRIMARY: Medicare A and B      Policy#: [id number]      Subscriber: Pt CM Name: ***      Phone#: ***     Fax#: *** Pre-Cert#: ***      Employer: *** Benefits:  Phone #: ***     Name: *** Eff. Date: ***     Deduct: ***      Out of Pocket Max: ***      Life Max: *** CIR: ***      SNF: *** Outpatient: ***     Co-Pay: *** Home Health: ***      Co-Pay: *** DME: ***     Co-Pay: *** Providers: *** SECONDARY: Tricare for Life       Policy#: [id number]     Phone#:   Financial Counselor:       Phone#:   The Data Collection Information Summary for patients in Inpatient Rehabilitation Facilities with attached Privacy Act [place] Records was provided and verbally reviewed with: Patient and Family  Emergency Contact Information Contact Information     Name Relation Home Work [place] Significant other [place]   [phone]   Scott,Kay Daughter   [phone]       Current Medical History  Patient Admitting Diagnosis: SDH History of Present Illness: Pt. Is a 88 year old male with history of hypertension, stroke, bilateral THRs, and Right rotator cuff repair admitted 08/23/21 from home  s/p fall from standing height sustaning small L SDH with no mass effect. He developed urinary retention during admission and an indwelling catheter had to be placed by Urology and started on Flomax. Patient was to follow up with urology in 7 days On 08/26/21, pt. Noted to be unresponsive and  rapid repsonse called. EEG performed and found  to have had a seizure. Pt. With deficits in cognition, with confusion and agitation overnights, and functional mobility, so CIR was consulted to  assist return to PLOF.  Complete NIHSS TOTAL: 5  Patient's medical record from [facility]  has been reviewed by the rehabilitation admission coordinator and physician.  Past Medical History  Past Medical History:  Diagnosis Date   ED (erectile dysfunction)    Hyperlipidemia    Hypertension    Hypothyroidism    Psoriasis    Stroke ([place])    Varicose veins    Vitamin D deficiency     Has the patient had major surgery during 100 days prior to admission? Yes  Family History   family history includes Breast cancer in an other family member.  Current Medications  Current Facility-Administered Medications:    0.9 %  sodium chloride infusion, 250 mL, Intravenous, PRN, Earnie Larsson, MD   acetaminophen (TYLENOL) tablet 650 mg, 650 mg, Oral, Q6H PRN, 650 mg at 08/23/21 2218 **OR** acetaminophen (TYLENOL) suppository 650 mg, 650 mg, Rectal, Q6H PRN, Earnie Larsson, MD   alum & mag hydroxide-simeth (MAALOX/MYLANTA) 200-200-20 MG/5ML suspension 30 mL, 30 mL, Oral, Q4H PRN, Earnie Larsson, MD   Chlorhexidine Gluconate Cloth 2 % PADS 6 each, 6 each, Topical, Daily, Pool, Mallie Mussel,  MD, 6 each at 08/28/21 0911   docusate sodium (COLACE) capsule 100 mg, 100 mg, Oral, BID, Earnie Larsson, MD, 100 mg at 08/28/21 0910   hydrALAZINE (APRESOLINE) injection 20 mg, 20 mg, Intravenous, Q4H PRN, Earnie Larsson, MD, 20 mg at 08/25/21 2104   HYDROcodone-acetaminophen (NORCO/VICODIN) 5-325 MG per tablet 1-2 tablet, 1-2 tablet, Oral, Q4H PRN, Earnie Larsson, MD, 1 tablet at 08/28/21 0215   levETIRAcetam (KEPPRA) tablet 500 mg, 500 mg, Oral, Q12H, Kirby-Graham, Karsten Fells, NP, 500 mg at 08/28/21 0911   ondansetron (ZOFRAN) tablet 4 mg, 4 mg, Oral, Q6H PRN **OR** ondansetron (ZOFRAN) injection 4 mg, 4 mg, Intravenous, Q6H PRN, Pool, Mallie Mussel, MD   sodium chloride flush (NS) 0.9 % injection 3 mL, 3 mL, Intravenous, Q12H, Pool, Henry, MD, 3 mL at 08/28/21 0912   sodium chloride flush (NS) 0.9 % injection 3 mL, 3 mL,  Intravenous, Q12H, Pool, Mallie Mussel, MD, 3 mL at 08/28/21 0910   sodium chloride flush (NS) 0.9 % injection 3 mL, 3 mL, Intravenous, PRN, Earnie Larsson, MD   tamsulosin [facility]) capsule 0.4 mg, 0.4 mg, Oral, Daily, Bergman, Meghan D, NP, 0.4 mg at 08/28/21 0600  Patients Current Diet:  Diet Order             Diet Heart Room service appropriate? Yes with Assist; Fluid consistency: Thin  Diet effective now                   Precautions / Restrictions Precautions Precautions: Fall Restrictions Weight Bearing Restrictions: No   Has the patient had 2 or more falls or a fall with injury in the past year? Yes  Prior Activity Level Community (5-7x/wk): Pt. active in the community PTA  Prior Functional Level Self Care: Did the patient need help bathing, dressing, using the toilet or eating? Independent  Indoor Mobility: Did the patient need assistance with walking from room to room (with or without device)? Independent  Stairs: Did the patient need assistance with internal or external stairs (with or without device)? Needed some help  Functional Cognition: Did the patient need help planning regular tasks such as shopping or remembering to take medications? Independent  Patient Information Are you of Hispanic, Latino/a,or Spanish origin?: A. No, not of Hispanic, Latino/a, or Spanish origin What is your race?: A. White Do you need or want an interpreter to communicate with a doctor or health care staff?: 0. No  Patient's Response To:  Health Literacy and Transportation Is the patient able to respond to health literacy and transportation needs?: No Health Literacy - How often do you need to have someone help you when you read instructions, pamphlets, or other written material from your doctor or pharmacy?: Never In the past 12 months, has lack of transportation kept you from medical appointments or from getting medications?: No In the past 12 months, has lack of transportation kept you  from meetings, work, or from getting things needed for daily living?: No  [place] / [place] Devices/Equipment: Eyeglasses, Dentures (specify type), Cane (specify quad or straight), Walker (specify type) Home Equipment: Standard Walker, Shower seat - built in  Prior Device Use: Indicate devices/aids used by the patient prior to current illness, exacerbation or injury? None of the above  Current Functional Level Cognition  Arousal/Alertness: Awake/alert Overall Cognitive Status: Impaired/Different from baseline Current Attention Level: Selective Orientation Level: Oriented to person, Oriented to place, Oriented to situation, Disoriented to time Following Commands: Follows one step commands with increased time  Safety/Judgement: Decreased awareness of safety, Decreased awareness of deficits General Comments: Pt with noted difficulty wayfinding, when given his room number to find he walked right past it then to end of hallway, turned around then was able to locate it with cues. Slow processing, decreased safety awareness, pt not understanding cues for proximity to RW despite lengthy discussion. Attention: Sustained Sustained Attention: Impaired Sustained Attention Impairment: Verbal basic Memory: Impaired Memory Impairment: Decreased recall of new information Awareness: Impaired Awareness Impairment: Intellectual impairment Problem Solving: Impaired Problem Solving Impairment: Verbal basic, Verbal complex Behaviors: Other (comment) (agitated by evaluation) Safety/Judgment: Impaired    Extremity Assessment (includes Sensation/Coordination)  Upper Extremity Assessment: Generalized weakness  Lower Extremity Assessment: Generalized weakness    ADLs  Overall ADL's : Needs assistance/impaired Eating/Feeding: Set up, Sitting Grooming: Minimal assistance, Standing Upper Body Bathing: Minimal assistance, Sitting Lower Body Bathing: Moderate assistance, Sit  to/from stand Upper Body Dressing : Minimal assistance, Sitting Upper Body Dressing Details (indicate cue type and reason): removing the R arm from his gown multiple times during session Lower Body Dressing: Moderate assistance, Sit to/from stand Toilet Transfer: Moderate assistance, Ambulation, Rolling walker (2 wheels), Regular Toilet Toilet Transfer Details (indicate cue type and reason): needed (A) to descend to commode with multipel cues to sit Toileting- Clothing Manipulation and Hygiene: Minimal assistance Toileting - Clothing Manipulation Details (indicate cue type and reason): noted to have some blood in toilet. wiping with some blood on butt area. uncertain if blood is rectal or if it could be from the foley trauma and due to gravity in that area Functional mobility during ADLs: Minimal assistance, Rolling walker (2 wheels) General ADL Comments: Pt progressed from bed to bathroom with alot of cues for safety with RW    Mobility  Overal bed mobility: Needs Assistance Bed Mobility: Supine to Sit Rolling: Min guard Sidelying to sit: Min assist, HOB elevated Supine to sit: Min guard General bed mobility comments: received in chair    Transfers  Overall transfer level: Needs assistance Equipment used: Rolling walker (2 wheels) Transfers: Sit to/from Stand Sit to Stand: Min guard General transfer comment: from chair x4 reps    Ambulation / Gait / Stairs / Emergency planning/management officer  Ambulation/Gait Ambulation/Gait assistance: Min guard, Herbalist (Feet): 100 Feet Assistive device: Rolling walker (2 wheels), Straight cane Gait Pattern/deviations: Step-through pattern General Gait Details: Pt holding RW too far advanced, given cues for RW proximity but only briefly remembers then keeps it too far advanced again. up to minA needed for stability when turning and for RW management. Pt with difficulty wayfinding to locate room upon return, see cognition. Gait velocity  interpretation: 1.31 - 2.62 ft/sec, indicative of limited community ambulator    Posture / Balance Balance Overall balance assessment: Needs assistance Sitting-balance support: Bilateral upper extremity supported, Feet supported Sitting balance-Leahy Scale: Fair Standing balance support: Bilateral upper extremity supported, During functional activity, Reliant on assistive device for balance Standing balance-Leahy Scale: Poor Standing balance comment: RW needed for dynamic standing    Special needs/care consideration Skin ***   Previous Home Environment (from acute therapy documentation) Living Arrangements: Alone  Lives With: Alone Available Help at Discharge: Family, Available 24 hours/day, Available PRN/intermittently (for short time period) Type of Home: House Home Layout: One level Home Access: Stairs to enter [employer] of Steps: 2 steps Bathroom Shower/Tub: Multimedia programmer: Standard Home Care Services: No Additional Comments: States that daughter can stay wtih him whenhe returns home  Discharge Living Setting  Plans for Discharge Living Setting: Patient's home Type of Home at Discharge: House Discharge Home Layout: One level Discharge Home Access: Stairs to enter Entrance Stairs-Rails: None Entrance Stairs-Number of Steps: 2 Discharge Bathroom Shower/Tub: Walk-in shower Discharge Bathroom Toilet: Standard Discharge Bathroom Accessibility: Yes How Accessible: Accessible via wheelchair, Accessible via walker  Social/Family/Support Systems Patient Roles: Other (Comment) Contact Information: Earl Lites girlfriend) Anticipated Caregiver: [phone] Does Caregiver/Family have Issues with Lodging/Transportation while Pt is in Rehab?: No  Goals Patient/Family Goal for Rehab: PT/OT/SLP Supervisoin- Mod I Expected length of stay: 7-10 days Pt/Family Agrees to Admission and willing to participate: Yes Program Orientation Provided & Reviewed with  Pt/Caregiver Including Roles  & Responsibilities: Yes  Decrease burden of Care through IP rehab admission: Decrease number of caregivers, Bowel and bladder program, and Patient/family education  Possible need for SNF placement upon discharge: not anticipated   Patient Condition: I have reviewed medical records from [facility] , spoken with CM, and patient and family member. I met with patient at the bedside and discussed via phone for inpatient rehabilitation assessment.  Patient will benefit from ongoing PT, OT, and SLP, can actively participate in 3 hours of therapy a day 5 days of the week, and can make measurable gains during the admission.  Patient will also benefit from the coordinated team approach during an Inpatient Acute Rehabilitation admission.  The patient will receive intensive therapy as well as Rehabilitation physician, nursing, social worker, and care management interventions.  Due to safety, skin/wound care, disease management, medication administration, pain management, and patient education the patient requires 24 hour a day rehabilitation nursing.  The patient is currently Min A  with mobility and basic ADLs.  Discharge setting and therapy post discharge at home with home health is anticipated.  Patient has agreed to participate in the Acute Inpatient Rehabilitation Program and will admit {Time; today/tomorrow:10263}.  Preadmission Screen Completed By:  Genella Mech, 08/28/2021 11:05 AM ______________________________________________________________________   Discussed status with Dr. Marland Kitchen on *** at *** and received approval for admission today.  Admission Coordinator:  Genella Mech, CCC-SLP, time Marland KitchenSudie Grumbling ***   Assessment/Plan: Diagnosis: Does the need for close, 24 hr/day Medical supervision in concert with the patient's rehab needs make it unreasonable for this patient to be served in a less intensive setting? {yes_no_potentially:3041433} Co-Morbidities  requiring supervision/potential complications: *** Due to {due CB:7628315}, does the patient require 24 hr/day rehab nursing? {yes_no_potentially:3041433} Does the patient require coordinated care of a physician, rehab nurse, PT, OT, and SLP to address physical and functional deficits in the context of the above medical diagnosis(es)? {yes_no_potentially:3041433} Addressing deficits in the following areas: {deficits:3041436} Can the patient actively participate in an intensive therapy program of at least 3 hrs of therapy 5 days a week? {yes_no_potentially:3041433} The potential for patient to make measurable gains while on inpatient rehab is {potential:3041437} Anticipated functional outcomes upon discharge from inpatient rehab: {functional outcomes:304600100} PT, {functional outcomes:304600100} OT, {functional outcomes:304600100} SLP Estimated rehab length of stay to reach the above functional goals is: *** Anticipated discharge destination: {anticipated dc setting:21604} 10. Overall Rehab/Functional Prognosis: {potential:3041437}   MD Signature: ***

## 2021-08-28 NOTE — Progress Notes (Signed)
Patient ID: Brendan Long, male   DOB: 04-Jan-1929, 88 y.o.   MRN: [id number] BP 113/85 (BP Location: Right Arm)    Pulse 93    Temp 98 F (36.7 C) (Oral)    Resp 19    Ht 5' 10.5" (1.791 m)    Wt 74.8 kg    SpO2 100%    BMI 23.34 kg/m  Had syncopal event while sitting in a chair. Code stroke activated.  Neurology examined and did not believe this event was associated with a cerebral infarct.  Nurse noted he was initially unresponsive to deep sternal rub. Had been sitting for a prolonged period of time.  Blood pressure noted to be very low. After being placed in bed his pressure recovered. Currently oriented to person, follows commands Remains confused at times. Moving all extremities

## 2021-08-28 NOTE — Progress Notes (Signed)
LTM hooked up and started at bedside. No skin breakdown noted. Results pending.

## 2021-08-28 NOTE — Significant Event (Signed)
Rapid Response Event Note   Reason for Call :  Unresponsive patient  Initial Focused Assessment:  RN asked me to come bedside to evaluate this patient who was found to be unresponsive in the chair. She states that he was his normal self about 45 minutes ago. His SBP was found to be in the 60's during this incident. He was only arousable to deep sternal rub and trapezius squeeze.   We got patient back to bed safely. Once he got back into the bed he started screaming and yelling. He would not follow any commands. He moved all extremities to pain. He had some degree of facial droop but when he started talking, his face was symetrical.  Patient has history of seizures, which he takes Keppra, and he received his Keppra today. No focal symptoms  Plan of Care:  Q30 neuro checks for 2 hours. After that Q4 hours for 24 hours. Please notify RRRN if there is any change.    Event Summary:   MD Notified: Neuro NP bedside Call Time: [place] Time: 1330 End Time: [place], RN

## 2021-08-28 NOTE — Evaluation (Signed)
Speech Language Pathology Evaluation Patient Details Name: Brendan Long MRN: [id number] DOB: 1929/01/06 Today's Date: 08/28/2021 Time: 7829-5621 SLP Time Calculation (min) (ACUTE ONLY): 22 min  Problem List:  Patient Active Problem List   Diagnosis Date Noted   SDH (subdural hematoma) 08/23/2021   Acute kidney injury superimposed on CKD ([place]) 03/19/2021   Syncope 03/19/2021   Confusion 03/19/2021   History of CVA (cerebrovascular accident) 05/07/2020   AF (paroxysmal atrial fibrillation) ([place]) 05/07/2020   DM (diabetes mellitus), type 2 with renal complications ([place]) 30/86/5784   AKI (acute kidney injury) ([place]) 05/07/2020   CVA (cerebral vascular accident) ([place]) 05/07/2020   Abnormal echocardiogram 05/07/2020   CKD (chronic kidney disease) stage 3, GFR 30-59 ml/min ([place]) 05/07/2020   Acute CVA (cerebrovascular accident) ([place])    Stroke ([place]) 05/06/2020   IGT (impaired glucose tolerance) 12/09/2019   Dizziness 03/18/2019   Neck swelling 07/26/2018   Elevated PSA 12/16/2014   Hypokalemia 12/16/2014   Hyperglycemia 12/15/2013   Renal insufficiency 12/15/2013   Dyspnea 12/15/2013   Hand pain, right 12/15/2013   Screening for cancer 12/05/2012   Encounter for long-term (current) use of other medications 12/05/2011   Wellness examination 12/05/2011   Osteoarthritis 12/05/2011   Cough 12/05/2011   ERECTILE DYSFUNCTION, NON-ORGANIC 11/09/2009   Vitamin D deficiency 08/13/2008   VARICOSE VEINS, LOWER EXTREMITIES 08/13/2008   UNS ADVRS EFF OTH RX MEDICINAL&BIOLOGICAL SBSTNC 08/13/2008   Hypothyroidism 05/02/2007   PSORIASIS 05/02/2007   Dyslipidemia 03/12/2007   Essential hypertension 03/12/2007   Past Medical History:  Past Medical History:  Diagnosis Date   ED (erectile dysfunction)    Hyperlipidemia    Hypertension    Hypothyroidism    Psoriasis    Stroke ([place])    Varicose veins    Vitamin D deficiency    Past Surgical History:  Past Surgical History:   Procedure Laterality Date   JOINT REPLACEMENT     joint replacement rt and left     rotator cuff repair, right shoulder per Dr. Tonita Cong  1996   torn right bicepts muscle  [place]  01/17/10   redone on right hip on 09/05/10   HPI:  88 year old male status post fall from standing height.  Fall mechanical in nature not associated with syncope.  Patient with immediate onset of left hip pain/buttock pain and some headache.  No loss of consciousness.  No symptoms of numbness paresthesias or weakness.  Prior history of old stroke.  Patient not currently anticoagulated.  He was pending a DC on 01/19 but had a seizure.  OT/PT are now recommeding inpatient rehab prior to DC home.  Patient reported that he lives alone and was managing all household responsibiltiies prior to admission.  MRI of the head was showing a small left subdural hematoma without significant mass effect.   Assessment / Plan / Recommendation Clinical Impression  Cognitive/linguistic evaluation and motor speech screen was completed.  Cranial nerve exam was completed and remarkable for slight droop on the left side of his face.  Otherwise, lingual, labial and jaw range of motion and strength appeared adequate.  Facial sensation appeared to be intact and he did not endorse a difference in sensation from the right to left side of his face.  Speech was clear and easy to understand.  No discernible dyasthria or apraxia was noted.  Reason for evaluation was explained in setting of subdural hematoma but the patient stated he felt that the therapist thought "he was stupid".  He achieved an overall score of 19/30 on the [place] Exam.  Deficits in memory, orientation and executive functioning were noted.   He was fully oriented to person, place and situation.  He was disoriented to time stating the year as 2022 and the month as July.  Even given cue that we just started a new year he replied that the month was June.  He had  good immediate recall of three novel words.  However, given a short delay he was unable to recall any of the novel words.  Given max cues he was able to recall 1/3 novel words.  Mild issues noted for the language tasks.  He struggled to repeat a short sentence and to copy a Agricultural consultant.  He was able to name objects, follow a 3 step command, read/comprehend a short sentence and write a sentence.  He required min cues to provide logical solutions to simple problems  He struggled to complete the clock drawing task.  He did not draw a fully closed circle.  All numbers were present, however, only the 12, 3 and 6 were inside the circle.  The remainder were drawn outside the circle and he wrote the numbers 9 and 10 twice.  Spacing issues were noted along with placement of the numbers.  In addition, question possible field cut/neglect on the left.  During the reading task he initially only read the words from the middle of the page over. He required max visual and verbal cues to find the start of the sentence to read all the words.  Reading appeared to be intact once he was able to find the beginning of the sentence.  Given results of this evaluation he will require ST follow during acute stay and at next level of care. He'd benefit from intense inpatient post acute rehab to maximize functional status and decrease caregiver burden.  Currently he'd need 24x7 supervision at DC.    SLP Assessment  SLP Recommendation/Assessment: Patient needs continued Speech Lanaguage Pathology Services SLP Visit Diagnosis: Frontal lobe and executive function deficit Frontal lobe and executive function deficit following:  (SDH following fall)    Recommendations for follow up therapy are one component of a multi-disciplinary discharge planning process, led by the attending physician.  Recommendations may be updated based on patient status, additional functional criteria and insurance authorization.    Follow Up Recommendations  Acute  inpatient rehab (3hours/day)    Assistance Recommended at Discharge  Frequent or constant Supervision/Assistance  Functional Status Assessment Patient has had a recent decline in their functional status and demonstrates the ability to make significant improvements in function in a reasonable and predictable amount of time.  Frequency and Duration min 2x/week  2 weeks      SLP Evaluation Cognition  Overall Cognitive Status: Impaired/Different from baseline Arousal/Alertness: Awake/alert Orientation Level: Oriented to person;Oriented to place;Oriented to situation;Disoriented to time Year: 2022 Month: July Day of Week: Incorrect Attention: Sustained Sustained Attention: Impaired Sustained Attention Impairment: Verbal basic Memory: Impaired Memory Impairment: Decreased recall of new information Awareness: Impaired Awareness Impairment: Intellectual impairment Problem Solving: Impaired Problem Solving Impairment: Verbal basic;Verbal complex Behaviors: Other (comment) (agitated by evaluation) Safety/Judgment: Impaired       Comprehension  Auditory Comprehension Overall Auditory Comprehension: Appears within functional limits for tasks assessed Yes/No Questions: Not tested Commands: Within Functional Limits Conversation: Simple Visual Recognition/Discrimination Discrimination: Not tested Reading Comprehension Reading Status: Within funtional limits    Expression Expression Primary Mode of Expression: Verbal Verbal Expression Overall  Verbal Expression: Appears within functional limits for tasks assessed Initiation: No impairment Automatic Speech: Name;Social Response Level of Generative/Spontaneous Verbalization: Sentence;Conversation Repetition: Impaired Level of Impairment: Sentence level Naming: No impairment Pragmatics: No impairment Non-Verbal Means of Communication: Not applicable Written Expression Dominant Hand: Right Written Expression: Within Functional  Limits   Oral / Motor  Oral Motor/Sensory Function Overall Oral Motor/Sensory Function: Mild impairment Facial ROM: Reduced left Facial Symmetry: Abnormal symmetry left Facial Strength: Reduced left Facial Sensation: Within Functional Limits Lingual ROM: Within Functional Limits Lingual Symmetry: Within Functional Limits Mandible: Within Functional Limits Motor Speech Overall Motor Speech: Appears within functional limits for tasks assessed Respiration: Within functional limits Phonation: Normal Resonance: Within functional limits Articulation: Within functional limitis Intelligibility: Intelligible Motor Planning: Witnin functional limits Motor Speech Errors: Not applicable           Shelly Flatten, [place], [place] [facility] SLP [phone]  Lamar Sprinkles 08/28/2021, 11:00 AM

## 2021-08-28 NOTE — Progress Notes (Signed)
NEUROLOGY CONSULTATION PROGRESS NOTE   Date of service: August 28, 2021 Patient Name: Brendan Long MRN:  [id number] DOB:  [birth year]   Interval Hx   Evaluated Brendan Long at bedside after the episode of confusion. He was essentially unresponsive with sas in 68s. Was agitated afterwards but non focal and following all commands.  On my evaluation, he is delirious. Thinks he is 73s years old. Feels something is under him. He is able to follow commands in all 4.  Vitals   Vitals:   08/28/21 1415 08/28/21 1425 08/28/21 1455 08/28/21 1529  BP: 121/82 113/85 128/83 112/81  Pulse: 97   87  Resp: 20 19 18 20   Temp: 97.9 F (36.6 C) 98 F (36.7 C) 98 F (36.7 C) 97.6 F (36.4 C)  TempSrc: Oral Oral Oral Oral  SpO2: 100% 100% 96% 95%  Weight:      Height:         Body mass index is 23.34 kg/m.  Physical Exam   General: Laying comfortably in bed; in no acute distress.  HENT: Normal oropharynx and mucosa. Normal external appearance of ears and nose.  Neck: Supple, no pain or tenderness  CV: No JVD. No peripheral edema.  Pulmonary: Symmetric Chest rise. Normal respiratory effort.  Abdomen: Soft to touch, non-tender.  Ext: No cyanosis, edema, or deformity  Skin: No rash. Normal palpation of skin.   Musculoskeletal: Normal digits and nails by inspection. No clubbing.   Neurologic Examination  Mental status/Cognition: Awake, eyes open. Oriented to self only. Speech/language: Fluent, comprehension intact to simple commands. Cranial nerves:   CN II Pupils equal and reactive to light, no VF deficits   CN III,IV,VI EOM intact, no gaze preference or deviation, no nystagmus   CN V normal sensation in V1, V2, and V3 segments bilaterally   CN VII no asymmetry, no nasolabial fold flattening   CN VIII normal hearing to speech   CN IX & X normal palatal elevation, no uvular deviation   CN XI 5/5 head turn and 5/5 shoulder shrug bilaterally   CN XII midline tongue protrusion    Motor:  Muscle bulk: normal, tone none  Mvmt Root Nerve  Muscle Right Left Comments  SA C5/6 Ax Deltoid     EF C5/6 Mc Biceps 5 5   EE C6/7/8 Rad Triceps 5 5   WF C6/7 Med FCR     WE C7/8 PIN ECU     F Ab C8/T1 U ADM/FDI 5 5   HF L1/2/3 Fem Illopsoas 5 5   KE L2/3/4 Fem Quad     DF L4/5 D Peron Tib Ant 5 5   PF S1/2 Tibial Grc/Sol 5 5     Sensation:  Light touch Intact throughout   Pin prick    Temperature    Vibration   Proprioception    Coordination/Complex Motor:  -intact BL to finger to nose.  Labs   Basic Metabolic Panel:  Lab Results  Component Value Date   NA 134 (L) 08/23/2021   K 3.7 08/23/2021   CO2 25 08/23/2021   GLUCOSE 87 08/23/2021   BUN 30 (H) 08/23/2021   CREATININE 1.80 (H) 08/23/2021   CALCIUM 9.0 08/23/2021   GFRNONAA 35 (L) 08/23/2021   GFRAA 38 (L) 05/07/2020   HbA1c:  Lab Results  Component Value Date   HGBA1C 6.0 (H) 05/07/2020   LDL:  Lab Results  Component Value Date   LDLCALC 132 (H) 05/07/2020   Urine Drug Screen:  Component Value Date/Time   LABOPIA NONE DETECTED 03/18/2019 1935   COCAINSCRNUR NONE DETECTED 03/18/2019 1935   LABBENZ NONE DETECTED 03/18/2019 1935   AMPHETMU NONE DETECTED 03/18/2019 1935   THCU NONE DETECTED 03/18/2019 1935   LABBARB NONE DETECTED 03/18/2019 1935    Alcohol Level     Component Value Date/Time   ETH <10 03/18/2019 1528   No results found for: PHENYTOIN, ZONISAMIDE, LAMOTRIGINE, LEVETIRACETA No results found for: PHENYTOIN, PHENOBARB, VALPROATE, CBMZ  Imaging and Diagnostic studies   CT HEAD CODE STROKE WO CONTRAST  1. Evaluation is significantly limited by motion artifact, despite repeat imaging. Within this limitation, no acute intracranial process. Grossly unchanged size of a left subdural hematoma. 2. ASPECTS is 10   rEEG from 08/26/21: Diffuse slowing, no epileptiform discharges, no seizures.  Impression   Brendan Long is a 88 year old male with a recent  admission on 1/17 for evaluation of altered mental status s/p mechanical fall from standing with imaging revealing acute subdural hemorrhage along the left parietal convexity.  Had an episode of acute encephalopathy, now improved and follows commands. Unclear what the episode earlier was. Will put this elderly gentleman on cEEG to evaluate for any potential epileptogenic discharges. He earlier had an episode concerning for a seizure 2 days ago, this time he is non focal and no obvious clinical sign of seizure.  Recommendations  - Will put him on cEEG for 24 hours to ensure no seizures. - continue Keppra 500mg  BID. ___________________________________________________________________    Thank you for the opportunity to take part in the care of this patient. If you have any further questions, please contact the neurology consultation attending.  Signed,  [facility] Pager Number [phone]

## 2021-08-28 NOTE — Progress Notes (Signed)
Inpatient Rehab Admissions Coordinator:   I spoke with Pt.'s stepdaughter Zigmund Daniel and she states that she is not able to provide 24/7 supervision at discharge, which Pt. Is likely to need. I have reached out to his significant other, Henderson Cloud, who has not returned my calls. If Henderson Cloud confirms that she is unable to provide 24/7 support, then Pt. Will need SNF.   Clemens Catholic, [place], [place] Admissions Coordinator  [phone] ([place]) [phone] (office)

## 2021-08-28 NOTE — Progress Notes (Signed)
NT found pt to be unresponsive while sitting in recliner chair. Upon assessment pt was only responsive to sternum rub. Rapid notified and pt was transferred to the bed where he immediatly showed signs of responsiveness and gurgled words. Pt assessed by Rapid and slowly pt is starting to come to.

## 2021-08-28 NOTE — Progress Notes (Signed)
Patient has not slept most of the night. Patient is confused. Agitation has improved. Patient complains of heartburn and indigestion. Patient says he "hurts all over and this heartburn is awful". PRN pain medication administered. Will continue to monitor.

## 2021-08-28 NOTE — Progress Notes (Signed)
Upon arriving for shift and rounding on patient, patient was touching and messing with EEG leads. Redirected and reoriented. Seems to understand that he must not touch the leads on his head or chest. Will continue to monitor.

## 2021-08-28 NOTE — Plan of Care (Signed)
Neurology plan of care:   NP was asked to come to bedside to evaluate for code stroke. NP to room. RRRN stated patient was up to chair in room. RN found him unresponsive and his BP was in the 60s. Patient was put back to bed yelling the whole time. When back to bed, he was awake, but not conversing very much. A few minutes later, he would open eyes spontaneously and talk. He kept saying he needed help, but would answer questions appropriately, like his name and his daughter's name. He followed commands. No focal neurology symptoms. Moves all 4 extremities on command and spontaneously. There was a question of left facial droop, but NP does not see it.   This is likely a hypoperfusion event given BP. He is waking up now. Seizure is a possible source of problem, but he has not had any seizures since placed on Keppra. Doubt he is post ictal with quick speech and easy to arouse after event.   Asked RN to do q30 min neuro checks for 2 hours, then q4hrs for 24 hours. Should he not continue to wake up to baseline, RN should call. Will consider EEG if event recurs or he is not waking up fully.   Discussed with Dr. Milas Gain who agrees with plan.   Clance Boll, MSN, APN-BC Neurology Nurse Practitioner Pager [phone]

## 2021-08-29 DIAGNOSIS — S065XAA Traumatic subdural hemorrhage with loss of consciousness status unknown, initial encounter: Secondary | ICD-10-CM | POA: Diagnosis not present

## 2021-08-29 DIAGNOSIS — R569 Unspecified convulsions: Secondary | ICD-10-CM | POA: Diagnosis not present

## 2021-08-29 LAB — RESP PANEL BY RT-PCR (FLU A&B, COVID) ARPGX2
Influenza A by PCR: NEGATIVE
Influenza B by PCR: NEGATIVE
SARS Coronavirus 2 by RT PCR: NEGATIVE

## 2021-08-29 NOTE — Progress Notes (Signed)
LTM maintain at bedside. No skin breakdown noted. Results pending.

## 2021-08-29 NOTE — Procedures (Addendum)
Patient Name: Brendan Long  MRN: [id number]  Epilepsy Attending: Lora Havens  Referring Physician/Provider: Donnetta Simpers, MD Duration: 08/28/2021 1835 to 08/29/2021 1030  Patient history: 88 year old male with left subdural hematoma noted to have an episode of confusion.  EEG to evaluate for seizures.  Level of alertness: Awake, asleep  AEDs during EEG study: Keppra  Technical aspects: This EEG study was done with scalp electrodes positioned according to the 10-20 International system of electrode placement. Electrical activity was acquired at a sampling rate of 500Hz  and reviewed with a high frequency filter of 70Hz  and a low frequency filter of 1Hz . EEG data were recorded continuously and digitally stored.   Description: The posterior dominant rhythm consists of 8 Hz activity of moderate voltage (25-35 uV) seen predominantly in posterior head regions, symmetric and reactive to eye opening and eye closing.  Sleep was characterized by vertex waves, sleep spindles (12 to 14 Hz), maximal frontocentral region.  EEG showed continuous generalized 5 to 6 Hz theta slowing. Hyperventilation and photic stimulation were not performed.     ABNORMALITY - Continuous slow, generalized  IMPRESSION: This study is suggestive of mild diffuse encephalopathy, nonspecific etiology. No seizures or epileptiform discharges were seen throughout the recording.  Brendan Long Brendan Long

## 2021-08-29 NOTE — Progress Notes (Signed)
° °  Providing Compassionate, Quality Care - Together   Subjective: Patient reports he would like to go home. He states he understands the need for further rehabilitation. Nurse reports patient has poor appetite.  Objective: Vital signs in last 24 hours: Temp:  [97.6 F (36.4 C)-100.6 F (38.1 C)] 97.8 F (36.6 C) (01/23 1211) Pulse Rate:  [72-89] 72 (01/23 1211) Resp:  [16-20] 18 (01/23 1211) BP: (112-147)/(71-85) 147/76 (01/23 1211) SpO2:  [95 %-100 %] 98 % (01/23 1211)  Intake/Output from previous day: 01/22 0701 - 01/23 0700 In: 75 [P.O.:75] Out: 350 [Urine:350] Intake/Output this shift: Total I/O In: 240 [P.O.:240] Out: -   Responds to voice, oriented x 4 PERRLA Speech clear CN II-XII grossly intact MAE, Sensation intact, generalized weakness  Lab Results: No results for input(s): WBC, HGB, HCT, PLT in the last 72 hours. BMET No results for input(s): NA, K, CL, CO2, GLUCOSE, BUN, CREATININE, CALCIUM in the last 72 hours.  Studies/Results: Overnight EEG with video  Result Date: 08/29/2021 Lora Havens, MD     08/29/2021 11:39 AM Patient Name: Brendan Long MRN: [id number] Epilepsy Attending: Lora Havens Referring Physician/Provider: Donnetta Simpers, MD Duration: 08/28/2021 1835 to 08/29/2021 1030 Patient history: 88 year old male with left subdural hematoma noted to have an episode of confusion.  EEG to evaluate for seizures. Level of alertness: Awake, asleep AEDs during EEG study: Keppra Technical aspects: This EEG study was done with scalp electrodes positioned according to the 10-20 International system of electrode placement. Electrical activity was acquired at a sampling rate of 500Hz  and reviewed with a high frequency filter of 70Hz  and a low frequency filter of 1Hz . EEG data were recorded continuously and digitally stored. Description: The posterior dominant rhythm consists of 8 Hz activity of moderate voltage (25-35 uV) seen predominantly in posterior  head regions, symmetric and reactive to eye opening and eye closing.  Sleep was characterized by vertex waves, sleep spindles (12 to 14 Hz), maximal frontocentral region.  EEG showed continuous generalized 5 to 6 Hz theta slowing. Hyperventilation and photic stimulation were not performed.   ABNORMALITY - Continuous slow, generalized IMPRESSION: This study is suggestive of mild diffuse encephalopathy, nonspecific etiology. No seizures or epileptiform discharges were seen throughout the recording. Lora Havens    Assessment/Plan: Patient suffered a fall from ground level on 08/23/2021, sustaining a small subdural hematoma. He was admitted to 4N ICU for observation. Follow up imaging performed on 08/24/2021 was stable, showing a small volume left convexity subdural hematoma without mass-effect. He developed urinary retention. An indwelling catheter had to be placed by Urology and the patient was started on Flomax. Patient was to follow up with urology in 7 days. Patient was to discharge on 08/25/2021, but had what appears to be a seizure and discharge was canceled. Another episode occurred on 08/28/2021. Stroke was ruled out again. Appeared to be a hypotensive event. Therapies now feel that patient would do better with discharge to SNF as the patient would not be able to participate in three hours of therapies daily.   LOS: 6 days   -Plan is for SNF at discharge -Continue to encourage mobilization -Discussed Foley catheter with Dr. Louis Meckel. We will trial removing the catheter in the morning.   Viona Gilmore, DNP, AGNP-C Nurse Practitioner  [facility] [place] [address], [address], [place], [place] [address] P: [phone]     F: [phone]  08/29/2021, 2:21 PM

## 2021-08-29 NOTE — [place] FL2 (Signed)
[place] MEDICAID FL2 LEVEL OF CARE SCREENING TOOL     IDENTIFICATION  Patient Name: Brendan Long Birthdate: 02/02/1929 Sex: male Admission Date (Current Location): 08/23/2021  [facility] and [place] Number:  Herbalist and Address:  [facility] [place]. [facility], [place] [address] [place], [place] [address]      Provider Number: [id number]  Attending Physician Name and Address:  Earnie Larsson, MD  Relative Name and Phone Number:  [name] [phone]    Current Level of Care: Hospital Recommended Level of Care: [place] Prior Approval Number:    Date Approved/Denied:   PASRR Number: [id number]  Discharge Plan: SNF    Current Diagnoses: Patient Active Problem List   Diagnosis Date Noted   SDH (subdural hematoma) 08/23/2021   Acute kidney injury superimposed on CKD ([place]) 03/19/2021   Syncope 03/19/2021   Confusion 03/19/2021   History of CVA (cerebrovascular accident) 05/07/2020   AF (paroxysmal atrial fibrillation) ([place]) 05/07/2020   DM (diabetes mellitus), type 2 with renal complications ([place]) 01/75/1025   AKI (acute kidney injury) ([place]) 05/07/2020   CVA (cerebral vascular accident) ([place]) 05/07/2020   Abnormal echocardiogram 05/07/2020   CKD (chronic kidney disease) stage 3, GFR 30-59 ml/min ([place]) 05/07/2020   Acute CVA (cerebrovascular accident) ([place])    Stroke ([place]) 05/06/2020   IGT (impaired glucose tolerance) 12/09/2019   Dizziness 03/18/2019   Neck swelling 07/26/2018   Elevated PSA 12/16/2014   Hypokalemia 12/16/2014   Hyperglycemia 12/15/2013   Renal insufficiency 12/15/2013   Dyspnea 12/15/2013   Hand pain, right 12/15/2013   Screening for cancer 12/05/2012   Encounter for long-term (current) use of other medications 12/05/2011   Wellness examination 12/05/2011   Osteoarthritis 12/05/2011   Cough 12/05/2011   ERECTILE DYSFUNCTION, NON-ORGANIC 11/09/2009   Vitamin D deficiency 08/13/2008   VARICOSE VEINS, LOWER  EXTREMITIES 08/13/2008   UNS ADVRS EFF OTH RX MEDICINAL&BIOLOGICAL SBSTNC 08/13/2008   Hypothyroidism 05/02/2007   PSORIASIS 05/02/2007   Dyslipidemia 03/12/2007   Essential hypertension 03/12/2007    Orientation RESPIRATION BLADDER Height & Weight     Self, Place  Normal Incontinent, Indwelling catheter Weight: 165 lb (74.8 kg) Height:  5' 10.5" (179.1 cm)  BEHAVIORAL SYMPTOMS/MOOD NEUROLOGICAL BOWEL NUTRITION STATUS      Incontinent Diet (DC summary)  AMBULATORY STATUS COMMUNICATION OF NEEDS Skin   Limited Assist Verbally Skin abrasions (Ecchymosis L arm)                       Personal Care Assistance Level of Assistance  Bathing, Feeding, Dressing Bathing Assistance: Limited assistance Feeding assistance: Independent Dressing Assistance: Limited assistance     Functional Limitations Info  Sight, Hearing, Speech Sight Info: Adequate Hearing Info: Adequate Speech Info: Adequate    SPECIAL CARE FACTORS FREQUENCY  PT (By licensed PT), OT (By licensed OT)     PT Frequency: 5x week OT Frequency: 5x week            Contractures Contractures Info: Not present    Additional Factors Info  Code Status, Allergies Code Status Info: Full Allergies Info: NKA           Current Medications (08/29/2021):  This is the current hospital active medication list Current Facility-Administered Medications  Medication Dose Route Frequency Provider Last Rate Last Admin   0.9 %  sodium chloride infusion  250 mL Intravenous PRN Earnie Larsson, MD       acetaminophen (TYLENOL) tablet 650 mg  650 mg Oral Q6H PRN Earnie Larsson, MD   650 mg at 08/28/21 2320   Or   acetaminophen (TYLENOL) suppository 650 mg  650 mg Rectal Q6H PRN Earnie Larsson, MD       alum & mag hydroxide-simeth (MAALOX/MYLANTA) 200-200-20 MG/5ML suspension 30 mL  30 mL Oral Q4H PRN Earnie Larsson, MD   30 mL at 08/28/21 2326   Chlorhexidine Gluconate Cloth 2 % PADS 6 each  6 each Topical Daily Earnie Larsson, MD   6 each at  08/28/21 0911   docusate sodium (COLACE) capsule 100 mg  100 mg Oral BID Earnie Larsson, MD   100 mg at 08/28/21 2201   hydrALAZINE (APRESOLINE) injection 20 mg  20 mg Intravenous Q4H PRN Earnie Larsson, MD   20 mg at 08/25/21 2104   HYDROcodone-acetaminophen (NORCO/VICODIN) 5-325 MG per tablet 1-2 tablet  1-2 tablet Oral Q4H PRN Earnie Larsson, MD   2 tablet at 08/29/21 0048   levETIRAcetam (KEPPRA) tablet 500 mg  500 mg Oral Q12H Gardiner Barefoot, NP   500 mg at 08/28/21 2201   ondansetron (ZOFRAN) tablet 4 mg  4 mg Oral Q6H PRN Earnie Larsson, MD       Or   ondansetron [facility]) injection 4 mg  4 mg Intravenous Q6H PRN Earnie Larsson, MD       sodium chloride flush (NS) 0.9 % injection 3 mL  3 mL Intravenous Q12H Earnie Larsson, MD   3 mL at 08/28/21 0912   sodium chloride flush (NS) 0.9 % injection 3 mL  3 mL Intravenous Q12H Earnie Larsson, MD   3 mL at 08/28/21 2151   sodium chloride flush (NS) 0.9 % injection 3 mL  3 mL Intravenous PRN Earnie Larsson, MD       tamsulosin [facility]) capsule 0.4 mg  0.4 mg Oral Daily Viona Gilmore D, NP   0.4 mg at 08/28/21 7867     Discharge Medications: Please see discharge summary for a list of discharge medications.  Relevant Imaging Results:  Relevant Lab Results:   Additional Information SS# [id number], [place]

## 2021-08-29 NOTE — Progress Notes (Signed)
EEG LTM D/C'd. Patient had minimal skin break down. Just redness. [facility] notified.

## 2021-08-29 NOTE — Progress Notes (Signed)
Neurology Progress Note  Brief HPI: 88 y.o. male admitted on 1/17 for evaluation of AMS s/p mechanical fall from standing with imaging revealing an acute SDH along  the left parietal convexity.   Interval History: - 1/19: patient was preparing for discharge when he had an episode of unresponsiveness with upward and rightward gaze and a Code Stroke was activated. Patient was confused and agitated during neurology evaluation. CTH revealed stable SDH within motion limited assessment. It was felt that the patient likely had a seizure prior to evaluation and he was started on Keppra. EEG with diffuse slowing.  - 1/22: Episode of unresponsiveness while sitting up at bedside recliner with SBP in the 60's without focal neurologic symptoms felt to be 2/2 a hypoperfusion event. cEEG placed overnight. Concerns for delirium on exam.   Subjective: No acute overnight events noted  Exam: Vitals:   08/28/21 2318 08/29/21 0344  BP: (!) 144/82 116/71  Pulse: 88 76  Resp: 16 16  Temp: (!) 100.6 F (38.1 C) 97.9 F (36.6 C)  SpO2: 96% 95%   Gen: Asleep, laying comfortably in hospital bed, in no acute distress Resp: non-labored breathing, no respiratory distress Abd: soft, non-tender, non-distended  Neuro: Mental Status: Asleep initially, wakes easily to voice.  He is able to state his name, the year, month, and place correctly. He states incorrectly that he is 88 years old.  Speech is mildly dysarthric.  He is able to follow simple commands, sometimes with repeat instruction.  There is no evidence of aphasia or neglect on exam.  Cranial Nerves: PERRL, EOMI, facial sensation is intact and symmetric to light touch, face appears symmetric resting and with movement, hearing is intact to voice, phonation intact.  Motor: 5/5 strength throughout without vertical drift. Normal tone and bulk.  Sensory: Intact and symmetric to light touch throughout. Gait: Deferred  Pertinent Labs: CBC    Component Value  Date/Time   WBC 10.8 (H) 08/23/2021 1447   RBC 4.93 08/23/2021 1447   HGB 15.2 08/23/2021 1447   HCT 46.2 08/23/2021 1447   PLT 196 08/23/2021 1447   MCV 93.7 08/23/2021 1447   MCH 30.8 08/23/2021 1447   MCHC 32.9 08/23/2021 1447   RDW 12.9 08/23/2021 1447   LYMPHSABS 2.3 08/23/2021 1447   MONOABS 0.6 08/23/2021 1447   EOSABS 0.5 08/23/2021 1447   BASOSABS 0.1 08/23/2021 1447   CMP     Component Value Date/Time   NA 134 (L) 08/23/2021 1447   K 3.7 08/23/2021 1447   CL 103 08/23/2021 1447   CO2 25 08/23/2021 1447   GLUCOSE 87 08/23/2021 1447   BUN 30 (H) 08/23/2021 1447   CREATININE 1.80 (H) 08/23/2021 1447   CALCIUM 9.0 08/23/2021 1447   CALCIUM 9.5 12/05/2012 0243   PROT 6.9 08/23/2021 1447   ALBUMIN 3.9 08/23/2021 1447   AST 23 08/23/2021 1447   ALT 15 08/23/2021 1447   ALKPHOS 69 08/23/2021 1447   BILITOT 2.2 (H) 08/23/2021 1447   GFRNONAA 35 (L) 08/23/2021 1447   GFRAA 38 (L) 05/07/2020 0941   Imaging Reviewed:  CT Head 1/19: 1. Evaluation is significantly limited by motion artifact, despite repeat imaging. Within this limitation, no acute intracranial process. Grossly unchanged size of a left subdural hematoma. 2. ASPECTS is 10  Routine EEG 1/20: "Impression: This EEG is abnormal. Diffuse slowing is seen, suggestive of a diffuse abnormality of the brain. This finding is nonspecific, and can be seen in a diffuse or multifocal abnormality of the brain. Clinical  correlation is needed. The absence of interictal epileptiform abnormalities does not exclude the diagnosis of a seizure disorder."  Overnight EEG 1/22 - 1/23: "This study is suggestive of mild diffuse encephalopathy, nonspecific etiology. No seizures or epileptiform discharges were seen throughout the recording."   Assessment: Brendan Long is a 88 year old male with a recent admission on 1/17 for evaluation of AMS s/p mechanical fall from standing with imaging revealing acute SDH along the left parietal  convexity.   Had an episode of acute encephalopathy 1/22, now improved and follows commands. Unclear what the episode earlier was though he was hypotensive with a SBP in the 60's during the event. cEEG initiated overnight to evaluate for any potential epileptogenic discharges. He had an episode concerning for a seizure 1/19 with unresponsiveness, right/up gaze and subsequent agitation and combativeness. On 1/22 he was non focal with no obvious clinical sign of seizure.  IMP: New onset seizure in the setting of subdural hematoma.  Recommendations: - Discontinue cEEG  - Continue Keppra 500 mg BID - Continue seizure precautions - Neurology will be available as needed.  Please call with questions.  Brendan Long, AGACNP-BC [facility] [phone]   Attending Neurohospitalist Addendum Patient seen and examined with APP/Resident. Agree with the history and physical as documented above. Agree with the plan as documented, which I helped formulate. I have independently reviewed the chart, obtained history, review of systems and examined the patient.I have personally reviewed pertinent head/neck/spine imaging (CT/MRI). Please feel free to call with any questions.  -- Amie [place], MD Neurologist [facility] Pager: [phone]

## 2021-08-29 NOTE — Progress Notes (Signed)
Inpatient Rehab Admissions Coordinator:   I spoke with Pt.'s significant other and family and none are able to provide the 24/7 supervision Pt. Would likely need following CIR. Daughter would like to pursue SNF. I will notify TOC and sign off.   Clemens Catholic, [place], [place] Admissions Coordinator  [phone] ([place]) [phone] (office)

## 2021-08-29 NOTE — Care Management Important Message (Signed)
Important Message  Patient Details  Name: Brendan Long MRN: [id number] Date of Birth: March 31, 1929   Medicare Important Message Given:  Yes     Orbie Pyo 08/29/2021, 3:26 PM

## 2021-08-29 NOTE — TOC Progression Note (Signed)
Transition of Care [facility]) - Progression Note    Patient Details  Name: Brendan Long MRN: [id number] Date of Birth: 12-15-1928  Transition of Care [facility]) CM/SW [place], [place] Phone Number: 08/29/2021, 4:12 PM  Clinical Narrative:   CSW met with patient and daughter at bedside to discuss SNF placement and provide bed offers. Patient's family familiar with [facility] and [facility], and would like to see if [facility] can be contacted. [facility] would be first choice, with [facility] as second choice. [facility] has offered. CSW sent referral to [facility] and asked them to review. CSW to follow.    Expected Discharge Plan: Skilled Nursing Facility Barriers to Discharge: Continued Medical Work up  Expected Discharge Plan and Services Expected Discharge Plan: [place]   Discharge Planning Services: CM Consult     Expected Discharge Date: 08/25/21               DME Arranged: Gilford Rile, 3-N-1 DME Agency: [employer] Date DME Agency Contacted: 08/25/21 Time DME Agency Contacted: [phone] Representative spoke with at DME Agency: [place]: PT, OT [place] Agency: [place] Date [place]: 08/25/21 Time [place]: [place] Representative spoke with at [place]: [place] ([place]) Interventions    Readmission Risk Interventions Readmission Risk Prevention Plan 03/20/2019  Post Dischage Appt Complete  Medication Screening Complete  Transportation Screening Complete  Some recent data might be hidden

## 2021-08-29 NOTE — Progress Notes (Signed)
Physical Therapy Treatment Patient Details Name: Brendan Long MRN: [id number] DOB: Dec 08, 1928 Today's Date: 08/29/2021   History of Present Illness 88 y/o male admitted 1/17  s/p fall from standing height sustaning small L SDH with no mass effect. 1/20 unresponsive with rapid repsonse called noted to have abnormal EEG PMH HTN, Stroke, BIL THR's, rotator cuff repair R shoulder.    PT Comments    Pt progressing towards all goals. Pt remains to have decreased insight to deficits and safety, remains to have poor walker safety, and is at increased falls risk. Pt unable to return home alone at this time. Pt to benefit from SNF upon d/c to allow increased time to achieve safe mod I level of function. Acute PT to cont to follow.    Recommendations for follow up therapy are one component of a multi-disciplinary discharge planning process, led by the attending physician.  Recommendations may be updated based on patient status, additional functional criteria and insurance authorization.  Follow Up Recommendations  Skilled nursing-short term rehab (<3 hours/day)     Assistance Recommended at Discharge Frequent or constant Supervision/Assistance  Patient can return home with the following A little help with walking and/or transfers;A little help with bathing/dressing/bathroom;Assistance with cooking/housework;Assist for transportation;Direct supervision/assist for medications management;Direct supervision/assist for financial management;Help with stairs or ramp for entrance   Equipment Recommendations  Rolling walker (2 wheels)    Recommendations for Other Services Rehab consult     Precautions / Restrictions Precautions Precautions: Fall Restrictions Weight Bearing Restrictions: No     Mobility  Bed Mobility Overal bed mobility: Needs Assistance Bed Mobility: Supine to Sit Rolling: Min assist Sidelying to sit: Min assist, HOB elevated       General bed mobility comments: HOB  elevated, max verbal and tactile directional cues to complete transfer, minA for trunk elevation    Transfers Overall transfer level: Needs assistance Equipment used: Rolling walker (2 wheels) Transfers: Sit to/from Stand Sit to Stand: Min assist           General transfer comment: minA to power up and steady during transition of hands, incresaed time, flexed bilat knees    Ambulation/Gait Ambulation/Gait assistance: Min assist, Mod assist Gait Distance (Feet): 100 Feet Assistive device: Rolling walker (2 wheels), Straight cane Gait Pattern/deviations: Step-through pattern, Decreased stride length, Shuffle Gait velocity: dec Gait velocity interpretation: <1.31 ft/sec, indicative of household ambulator   General Gait Details: modA to prevent walker from going to far forwards, frequent verbal cues to "stay in walker", pt with no carry over, pt with short step height and lenght like shuffle type gait pattern   Stairs             Wheelchair Mobility    Modified Rankin (Stroke Patients Only) Modified Rankin (Stroke Patients Only) Pre-Morbid Rankin Score: Slight disability Modified Rankin: Moderate disability     Balance Overall balance assessment: Needs assistance Sitting-balance support: Bilateral upper extremity supported, Feet supported Sitting balance-Leahy Scale: Fair Sitting balance - Comments: pt with anterior bias   Standing balance support: Bilateral upper extremity supported, During functional activity, Reliant on assistive device for balance Standing balance-Leahy Scale: Poor Standing balance comment: RW needed for dynamic standing                            Cognition Arousal/Alertness: Awake/alert Behavior During Therapy: Impulsive Overall Cognitive Status: Impaired/Different from baseline Area of Impairment: Orientation, Attention, Memory, Following commands, Safety/judgement, Awareness, Problem solving  Orientation  Level: Disoriented to, Situation Current Attention Level: Selective Memory: Decreased recall of precautions, Decreased short-term memory Following Commands: Follows one step commands with increased time Safety/Judgement: Decreased awareness of safety, Decreased awareness of deficits Awareness: Intellectual Problem Solving: Slow processing, Difficulty sequencing, Requires verbal cues, Requires tactile cues General Comments: pt with decresaed insight to deficits and safety, no recall or careover of walker safety        Exercises      General Comments General comments (skin integrity, edema, etc.): pt with dark urine, VSS on RA      Pertinent Vitals/Pain Pain Assessment Pain Assessment: Faces Faces Pain Scale: Hurts a little bit Pain Location: lower back pain from "laying in the bed all dayd" Pain Descriptors / Indicators: Discomfort, Guarding Pain Intervention(s): Monitored during session    Home Living                          Prior Function            PT Goals (current goals can now be found in the care plan section) Acute Rehab PT Goals PT Goal Formulation: With patient Time For Goal Achievement: 09/01/21 Potential to Achieve Goals: Good Progress towards PT goals: Progressing toward goals    Frequency    Min 3X/week      PT Plan Discharge plan needs to be updated    Co-evaluation              AM-PAC PT "6 Clicks" Mobility   Outcome Measure  Help needed turning from your back to your side while in a flat bed without using bedrails?: A Little Help needed moving from lying on your back to sitting on the side of a flat bed without using bedrails?: A Little Help needed moving to and from a bed to a chair (including a wheelchair)?: A Little Help needed standing up from a chair using your arms (e.g., wheelchair or bedside chair)?: A Little Help needed to walk in hospital room?: A Lot (mod cues) Help needed climbing 3-5 steps with a railing? : A  Lot (mod cues) 6 Click Score: 16    End of Session Equipment Utilized During Treatment: Gait belt Activity Tolerance: Patient tolerated treatment well Patient left: in chair;with call bell/phone within reach;with chair alarm set;with family/visitor present Nurse Communication: Mobility status PT Visit Diagnosis: Other abnormalities of gait and mobility (R26.89);Muscle weakness (generalized) (M62.81);Difficulty in walking, not elsewhere classified (R26.2)     Time: 3016-0109 PT Time Calculation (min) (ACUTE ONLY): 21 min  Charges:  $Gait Training: 8-22 mins                     Kittie Plater, PT, DPT Acute Rehabilitation Services Pager #: [phone] Office #: [phone]    Berline Lopes 08/29/2021, 2:57 PM

## 2021-08-30 LAB — CBC
HCT: 42.6 % (ref 39.0–52.0)
Hemoglobin: 14.3 g/dL (ref 13.0–17.0)
MCH: 31.4 pg (ref 26.0–34.0)
MCHC: 33.6 g/dL (ref 30.0–36.0)
MCV: 93.6 fL (ref 80.0–100.0)
Platelets: 217 10*3/uL (ref 150–400)
RBC: 4.55 MIL/uL (ref 4.22–5.81)
RDW: 12.9 % (ref 11.5–15.5)
WBC: 7.9 10*3/uL (ref 4.0–10.5)
nRBC: 0 % (ref 0.0–0.2)

## 2021-08-30 LAB — BASIC METABOLIC PANEL
Anion gap: 12 (ref 5–15)
BUN: 28 mg/dL — ABNORMAL HIGH (ref 8–23)
CO2: 26 mmol/L (ref 22–32)
Calcium: 9.2 mg/dL (ref 8.9–10.3)
Chloride: 103 mmol/L (ref 98–111)
Creatinine, Ser: 1.63 mg/dL — ABNORMAL HIGH (ref 0.61–1.24)
GFR, Estimated: 40 mL/min — ABNORMAL LOW (ref >60)
Glucose, Bld: 100 mg/dL — ABNORMAL HIGH (ref 70–99)
Potassium: 3.8 mmol/L (ref 3.5–5.1)
Sodium: 141 mmol/L (ref 135–145)

## 2021-08-30 LAB — URINE CULTURE
Culture: >=100000 — AB
Special Requests: NORMAL

## 2021-08-30 MED ORDER — HYDROCODONE-ACETAMINOPHEN 5-325 MG PO TABS: 1.0000 | ORAL_TABLET | Freq: Four times a day (QID) | ORAL | 0 refills | Status: DC | PRN

## 2021-08-30 MED ORDER — DIVALPROEX SODIUM 250 MG PO DR TAB
500.0000 mg | DELAYED_RELEASE_TABLET | Freq: Two times a day (BID) | ORAL | Status: DC
  Administered 2021-08-30 – 2021-08-31 (×2): 500 mg via ORAL
  Filled 2021-08-30 (×2): qty 2

## 2021-08-30 MED ORDER — LEVETIRACETAM 500 MG PO TABS: 500.0000 mg | ORAL_TABLET | Freq: Two times a day (BID) | ORAL | 1 refills | Status: DC

## 2021-08-30 MED ORDER — SODIUM CHLORIDE 0.9 % IV BOLUS
500.0000 mL | Freq: Once | INTRAVENOUS | Status: AC
  Administered 2021-08-30: 14:00:00 500 mL via INTRAVENOUS

## 2021-08-30 MED ORDER — SODIUM CHLORIDE 0.9 % IV SOLN: INTRAVENOUS | Status: DC

## 2021-08-30 MED ORDER — DEXAMETHASONE SODIUM PHOSPHATE 10 MG/ML IJ SOLN
8.0000 mg | Freq: Once | INTRAMUSCULAR | Status: AC
Start: 2021-08-30 — End: 2021-08-30
  Administered 2021-08-30: 22:00:00 8 mg via INTRAVENOUS
  Filled 2021-08-30: qty 1

## 2021-08-30 MED ORDER — DEXAMETHASONE SODIUM PHOSPHATE 4 MG/ML IJ SOLN
4.0000 mg | Freq: Four times a day (QID) | INTRAMUSCULAR | Status: AC
  Administered 2021-08-31 (×2): 4 mg via INTRAVENOUS
  Filled 2021-08-30 (×2): qty 1

## 2021-08-30 NOTE — Progress Notes (Signed)
Foley removed as per order at 0600

## 2021-08-30 NOTE — Progress Notes (Signed)
Speech Language Pathology Treatment: Cognitive-Linquistic  Patient Details Name: Brendan Long MRN: [id number] DOB: 06/22/1929 Today's Date: 08/30/2021 Time: 3762-8315 SLP Time Calculation (min) (ACUTE ONLY): 25 min  Assessment / Plan / Recommendation Clinical Impression  Patient seen by SLP for skilled treatment with focus on cognitive function goals. Patient alert, sitting in bed and attempting to use his phone. Breakfast tray in front of him but patient did not eat anything and RN who arrived to give him medications reported he hasn't eaten much of anything. SLP observed that he was turning his phone on and off and swiping his finger on blank screen. He told SLP he was trying to call his daughter. SLP assisted with looking through contact list on his phone and neither his daughter nor his son's numbers were present. Patient unable to recall his daughter's phone number and so SLP looked up in medical chart. Of note, patient's phone had alert that sim card was not in phone and so only emergency calls could be made. Patient was unaware of this and said "I dont like this phone anyway". He was oriented to approximate date saying "24th or 25th" and was able to recall recent events of having foley removed, but could not recall any therapist or MD visits. SLP dialed patient's daughter on room phone and SLP overheard him to discuss SNF choices with daughter and he was accurate with this information. Discharge plan is currently SNF as no one in his family can provide 24 hour supervision for CIR. SLP to continue to follow patient for cognitive function goals and recommending SNF level SLP intervention upon hospital discharge.    HPI HPI: 88 year old male status post fall from standing height.  Fall mechanical in nature not associated with syncope.  Patient with immediate onset of left hip pain/buttock pain and some headache.  No loss of consciousness.  No symptoms of numbness paresthesias or weakness.  Prior  history of old stroke.  Patient not currently anticoagulated.  He was pending a DC on 01/19 but had a seizure.  OT/PT are now recommeding inpatient rehab prior to DC home.  Patient reported that he lives alone and was managing all household responsibiltiies prior to admission.  MRI of the head was showing a small left subdural hematoma without significant mass effect.      SLP Plan  Continue with current plan of care      Recommendations for follow up therapy are one component of a multi-disciplinary discharge planning process, led by the attending physician.  Recommendations may be updated based on patient status, additional functional criteria and insurance authorization.    Recommendations                   Follow Up Recommendations: Skilled nursing-short term rehab (<3 hours/day) Assistance recommended at discharge: Frequent or constant Supervision/Assistance SLP Visit Diagnosis: Cognitive communication deficit (V76.160) Plan: Continue with current plan of care          Sonia Baller, MA, CCC-SLP Speech Therapy

## 2021-08-30 NOTE — Progress Notes (Signed)
Occupational Therapy Treatment Patient Details Name: Brendan Long MRN: [id number] DOB: 1928-11-14 Today's Date: 08/30/2021   History of present illness 88 y/o male admitted 1/17  s/p fall from standing height sustaning small L SDH with no mass effect. 1/20 unresponsive with rapid repsonse called noted to have abnormal EEG PMH HTN, Stroke, BIL THR's, rotator cuff repair R shoulder.   OT comments  Tammie is progressing incrementally. He required constant cues for RW management and safety, but noted to have better wayfinding within the room this session. He was able to ambulate to the bathroom and from the bathroom to the recliner without wayfinding cues. Pt had difficulty with voiding & BM this session with blood noted on the tissue, RN notified.Pt continues to benefit from OT acutely. D/c recommendation update to SNF as CIR declined and pt is not safe enough to go home.    Recommendations for follow up therapy are one component of a multi-disciplinary discharge planning process, led by the attending physician.  Recommendations may be updated based on patient status, additional functional criteria and insurance authorization.    Follow Up Recommendations  Skilled nursing-short term rehab (<3 hours/day) (CIR declined pt due to not having 24/7 support at home.)    Assistance Recommended at Discharge Frequent or constant Supervision/Assistance  Patient can return home with the following  A little help with walking and/or transfers;A little help with bathing/dressing/bathroom;Direct supervision/assist for financial management;Direct supervision/assist for medications management;Assistance with cooking/housework;Assist for transportation   Equipment Recommendations  Other (comment)       Precautions / Restrictions Precautions Precautions: Fall Restrictions Weight Bearing Restrictions: No       Mobility Bed Mobility Overal bed mobility: Needs Assistance Bed Mobility: Supine to  Sit Rolling: Min assist         General bed mobility comments: min A to advance hips towards EOB    Transfers Overall transfer level: Needs assistance Equipment used: Rolling walker (2 wheels) Transfers: Sit to/from Stand Sit to Stand: Min assist           General transfer comment: pt with poor RW management, requires cues. Did well with slowly decending     Balance Overall balance assessment: Needs assistance Sitting-balance support: Feet supported Sitting balance-Leahy Scale: Fair Sitting balance - Comments: sitting on toilet for extended period of time without UE support   Standing balance support: Bilateral upper extremity supported, During functional activity, Reliant on assistive device for balance Standing balance-Leahy Scale: Poor Standing balance comment: RW needed for dynamic standing                           ADL either performed or assessed with clinical judgement   ADL Overall ADL's : Needs assistance/impaired                         Toilet Transfer: Minimal assistance;Ambulation;Rolling walker (2 wheels);Regular Glass blower/designer Details (indicate cue type and reason): min A for RW management and cues for hand placement Toileting- Clothing Manipulation and Hygiene: Min guard;Sitting/lateral lean       Functional mobility during ADLs: Minimal assistance;Rolling walker (2 wheels) General ADL Comments: session focused on needing to void. pt required verbal cues for safety throughout    Extremity/Trunk Assessment Upper Extremity Assessment Upper Extremity Assessment: Generalized weakness   Lower Extremity Assessment Lower Extremity Assessment: Defer to PT evaluation        Vision   Vision Assessment?: No  apparent visual deficits   Perception Perception Perception: Within Functional Limits   Praxis      Cognition Arousal/Alertness: Awake/alert Behavior During Therapy: Flat affect, Impulsive Overall Cognitive Status:  Impaired/Different from baseline Area of Impairment: Orientation, Attention, Memory, Following commands, Safety/judgement, Awareness, Problem solving                 Orientation Level: Disoriented to, Situation Current Attention Level: Selective Memory: Decreased recall of precautions, Decreased short-term memory Following Commands: Follows one step commands with increased time Safety/Judgement: Decreased awareness of safety, Decreased awareness of deficits Awareness: Intellectual Problem Solving: Slow processing, Difficulty sequencing, Requires verbal cues, Requires tactile cues General Comments: requires constant cues for safety, RW management and wayfinding. poor insight to deficits.              General Comments pt with red rash on face and neck. stating he feels like he needs to urinate, unable. Had BM, noted blood on tissue in toilet after. RN made aware of all.    Pertinent Vitals/ Pain       Pain Assessment Pain Assessment: Faces Faces Pain Scale: Hurts a little bit Pain Location: face/neck rash Pain Descriptors / Indicators: Discomfort, Tender Pain Intervention(s): Limited activity within patient's tolerance, Monitored during session   Frequency  Min 2X/week        Progress Toward Goals  OT Goals(current goals can now be found in the care plan section)  Progress towards OT goals: Progressing toward goals  Acute Rehab OT Goals Patient Stated Goal: to have a BM OT Goal Formulation: With patient Time For Goal Achievement: 09/08/21 Potential to Achieve Goals: Good ADL Goals Pt Will Perform Lower Body Bathing: with modified independence;sitting/lateral leans;sit to/from stand Pt Will Perform Lower Body Dressing: with modified independence;sitting/lateral leans;sit to/from stand Pt Will Transfer to Toilet: with modified independence;ambulating Pt Will Perform Toileting - Clothing Manipulation and hygiene: with modified independence;sitting/lateral leans;sit  to/from stand Additional ADL Goal #1: Pt will complete a multistep pathfinding task with no assist Additional ADL Goal #2: Pt will complete a medication managment task with no errors.  Plan Discharge plan needs to be updated       AM-PAC OT "6 Clicks" Daily Activity     Outcome Measure   Help from another person eating meals?: A Little Help from another person taking care of personal grooming?: A Little Help from another person toileting, which includes using toliet, bedpan, or urinal?: A Little Help from another person bathing (including washing, rinsing, drying)?: A Lot Help from another person to put on and taking off regular upper body clothing?: A Little Help from another person to put on and taking off regular lower body clothing?: A Lot 6 Click Score: 16    End of Session Equipment Utilized During Treatment: Rolling walker (2 wheels)  OT Visit Diagnosis: Unsteadiness on feet (R26.81);Other abnormalities of gait and mobility (R26.89);Muscle weakness (generalized) (M62.81)   Activity Tolerance Patient tolerated treatment well   Patient Left in chair;with call bell/phone within reach;with chair alarm set;with nursing/sitter in room   Nurse Communication Mobility status (red rash, BM, blood, unable to void)        Time: 4174-0814 OT Time Calculation (min): 30 min  Charges: OT General Charges $OT Visit: 1 Visit OT Treatments $Self Care/Home Management : 23-37 mins   Barrett Goldie A Adriana Lina 08/30/2021, 12:43 PM

## 2021-08-30 NOTE — Progress Notes (Signed)
Called regarding rash and question if it is related to keppra.   On exam, he is awake, alert, oriented and appropriate.   He has a maculopapular rash involving his head, no involvement anywhere below his neck.

## 2021-08-30 NOTE — Discharge Summary (Signed)
Physician Discharge Summary     Providing Compassionate, Quality Care - Together   Patient ID: Brendan Long MRN: [id number] DOB/AGE: [birth year] 88 y.o.  Admit date: 08/23/2021 Discharge date: 08/30/2021  Admission Diagnoses: SDH  Discharge Diagnoses:  Principal Problem:   SDH (subdural hematoma)   Discharged Condition: good  Hospital Course: Patient suffered a fall from ground level on 08/23/2021, sustaining a small subdural hematoma. He was admitted to 4N ICU for observation. Follow up imaging performed on 08/24/2021 was stable, showing a small volume left convexity subdural hematoma without mass-effect. He developed urinary retention. An indwelling catheter had to be placed by Urology and the patient was started on Flomax. Patient was to follow up with urology in 7 days, but has been admitted for 1 weeks now. Patient was to discharge on 08/25/2021, but had what appears to be a seizure and discharge was canceled. Another episode occurred on 08/28/2021. Stroke was ruled out again. Appeared to be a hypotensive event. Therapies now feel that patient would do better with discharge to SNF as the patient would not be able to participate in three hours of therapies daily and there is not 24/7 support at home. Patient was noted to have developed a maculopapular rash all over his face and scalp. It is unclear whether this was related to the EEG glue used or Keppra. A reaction to the medication did not seem likely as the rash was isolated to the patient's head. Neurology opted to err on the side of caution and switched his Keppra to depakote on 08/30/2021. He was also given an IV steroid, which improved the rash a great deal. Foley catheter was removed early AM on 08/30/2020. Patient was unable to fully empty his bladder and a Coude catheter was placed again on 08/31/2021. Patient is to follow up with urology in one week.   Consults: rehabilitation medicine and urology  Significant Diagnostic Studies:  radiology: CT ABDOMEN PELVIS WO CONTRAST  Result Date: 08/24/2021 CLINICAL DATA:  Gross hematuria, hip pain EXAM: CT ABDOMEN AND PELVIS WITHOUT CONTRAST TECHNIQUE: Multidetector CT imaging of the abdomen and pelvis was performed following the standard protocol without IV contrast. RADIATION DOSE REDUCTION: This exam was performed according to the departmental dose-optimization program which includes automated exposure control, adjustment of the mA and/or kV according to patient size and/or use of iterative reconstruction technique. COMPARISON:  None. FINDINGS: Lower chest: No acute abnormality.  Coronary artery calcifications. Hepatobiliary: No solid liver abnormality is seen. No gallstones, gallbladder wall thickening, or biliary dilatation. Pancreas: Unremarkable. No pancreatic ductal dilatation or surrounding inflammatory changes. Spleen: Normal in size without significant abnormality. Adrenals/Urinary Tract: Adrenal glands are unremarkable. Kidneys are normal, without renal calculi, solid lesion, or hydronephrosis. Foley catheter within the urinary bladder. Wall thickening of the urinary bladder, with hyperdense internal contents. Evaluation is somewhat limited by metallic streak artifact from adjacent hip arthroplasty Stomach/Bowel: Stomach is within normal limits. Appendix appears normal. No evidence of bowel wall thickening, distention, or inflammatory changes. Vascular/Lymphatic: Aortic atherosclerosis. No enlarged abdominal or pelvic lymph nodes. Reproductive: Severe prostatomegaly. Other: Small, fat containing bilateral inguinal hernias. No ascites. Musculoskeletal: No acute or significant osseous findings. Status post right hip total arthroplasty. IMPRESSION: 1. No urinary tract calculus or hydronephrosis. 2. Wall thickening of the urinary bladder, with hyperdense internal contents, suggestive of blood products. Evaluation is somewhat limited by metallic streak artifact from adjacent hip arthroplasty.  3. Severe prostatomegaly with Foley catheter. 4. Coronary artery disease. Aortic Atherosclerosis (ICD10-I70.0). Electronically Signed  By: Delanna Ahmadi M.D.   On: 08/24/2021 09:48   CT HEAD WO CONTRAST (5MM)  Result Date: 08/24/2021 CLINICAL DATA:  Follow-up examination for subdural hematoma. EXAM: CT HEAD WITHOUT CONTRAST TECHNIQUE: Contiguous axial images were obtained from the base of the skull through the vertex without intravenous contrast. RADIATION DOSE REDUCTION: This exam was performed according to the departmental dose-optimization program which includes automated exposure control, adjustment of the mA and/or kV according to patient size and/or use of iterative reconstruction technique. COMPARISON:  Prior CT from 08/23/2021. FINDINGS: Brain: Previously identified left subdural hematoma again seen, relatively unchanged measuring up to 7 mm in maximal thickness. No more than mild mass effect on the subjacent left cerebral hemisphere with no more than trace 2 mm left-to-right shift. No evidence for significant interval bleeding. No other new acute intracranial hemorrhage. No visible acute large vessel territory infarct. Chronic right PCA and cerebellar infarcts noted. Remote lacunar infarcts present about the bilateral basal ganglia. Underlying atrophy with chronic small vessel ischemic disease. Vascular: No hyperdense vessel. Calcified atherosclerosis present at the skull base. Skull: Scalp soft tissues and calvarium demonstrate no new finding. Sinuses/Orbits: Globes and orbital soft tissues demonstrate no acute finding. Chronic mucosal thickening noted throughout the ethmoidal air cells, sphenoid sinuses, and maxillary sinuses. Mastoid air cells remain clear. Other: None. IMPRESSION: 1. No significant interval change in size of left subdural hematoma measuring up to 7 mm in maximal thickness. Mild mass effect on the subjacent left cerebral hemisphere with no more than trace 2 mm left-to-right shift.  2. No other new acute intracranial abnormality. Electronically Signed   By: Jeannine Boga M.D.   On: 08/24/2021 03:29   CT HEAD WO CONTRAST (5MM)  Result Date: 08/23/2021 CLINICAL DATA:  Neck pain, left hip pain status post fall this morning. EXAM: CT HEAD WITHOUT CONTRAST CT CERVICAL SPINE WITHOUT CONTRAST TECHNIQUE: Multidetector CT imaging of the head and cervical spine was performed following the standard protocol without intravenous contrast. Multiplanar CT image reconstructions of the cervical spine were also generated. RADIATION DOSE REDUCTION: This exam was performed according to the departmental dose-optimization program which includes automated exposure control, adjustment of the mA and/or kV according to patient size and/or use of iterative reconstruction technique. COMPARISON:  None. FINDINGS: Brain: Acute hemorrhage along the left cerebral convexity along the parietal lobe measuring 7 mm in thickness. 1-2 mm left-to-right midline shift. No evidence of acute infarction, ventriculomegaly, or mass effect. 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 generalized cerebral atrophy. Periventricular white matter low attenuation likely secondary to microangiopathy. Vascular: Cerebrovascular atherosclerotic calcifications are noted. No hyperdense vessels. Skull: Negative for fracture or focal lesion. Sinuses/Orbits: Visualized portions of the orbits are unremarkable. Visualized portions of the paranasal sinuses are unremarkable. Visualized portions of the mastoid air cells are unremarkable. Other: None. CT CERVICAL SPINE FINDINGS Alignment: Normal. Skull base and vertebrae: No acute fracture. No primary bone lesion or focal pathologic process. Soft tissues and spinal canal: No prevertebral fluid or swelling. No visible canal hematoma. Disc levels: Degenerative disease with disc height loss at C3-4, C4-5, C5-6, C6-7, C7-T1 and T1-2 with mild bilateral facet arthropathy. Bilateral uncovertebral  degenerative changes at C3-4 and C4-5 with moderate-severe foraminal stenosis. Bilateral uncovertebral degenerative changes at C5-6 with mild foraminal stenosis. Upper chest: Lung apices are clear. Other: No fluid collection or hematoma. IMPRESSION: 1. Acute subdural hemorrhage along the left parietal convexity measuring 7 mm in thickness. 1-2 mm left right midline shift. 2.  No acute osseous injury  of the cervical spine. Critical Value/emergent results were called by telephone at the time of interpretation on 08/23/2021 at 2:17 pm to provider [facility] , who verbally acknowledged these results. Electronically Signed   By: Kathreen Devoid M.D.   On: 08/23/2021 14:19   CT Cervical Spine Wo Contrast  Result Date: 08/23/2021 CLINICAL DATA:  Neck pain, left hip pain status post fall this morning. EXAM: CT HEAD WITHOUT CONTRAST CT CERVICAL SPINE WITHOUT CONTRAST TECHNIQUE: Multidetector CT imaging of the head and cervical spine was performed following the standard protocol without intravenous contrast. Multiplanar CT image reconstructions of the cervical spine were also generated. RADIATION DOSE REDUCTION: This exam was performed according to the departmental dose-optimization program which includes automated exposure control, adjustment of the mA and/or kV according to patient size and/or use of iterative reconstruction technique. COMPARISON:  None. FINDINGS: Brain: Acute hemorrhage along the left cerebral convexity along the parietal lobe measuring 7 mm in thickness. 1-2 mm left-to-right midline shift. No evidence of acute infarction, ventriculomegaly, or mass effect. 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 generalized cerebral atrophy. Periventricular white matter low attenuation likely secondary to microangiopathy. Vascular: Cerebrovascular atherosclerotic calcifications are noted. No hyperdense vessels. Skull: Negative for fracture or focal lesion. Sinuses/Orbits: Visualized portions of the  orbits are unremarkable. Visualized portions of the paranasal sinuses are unremarkable. Visualized portions of the mastoid air cells are unremarkable. Other: None. CT CERVICAL SPINE FINDINGS Alignment: Normal. Skull base and vertebrae: No acute fracture. No primary bone lesion or focal pathologic process. Soft tissues and spinal canal: No prevertebral fluid or swelling. No visible canal hematoma. Disc levels: Degenerative disease with disc height loss at C3-4, C4-5, C5-6, C6-7, C7-T1 and T1-2 with mild bilateral facet arthropathy. Bilateral uncovertebral degenerative changes at C3-4 and C4-5 with moderate-severe foraminal stenosis. Bilateral uncovertebral degenerative changes at C5-6 with mild foraminal stenosis. Upper chest: Lung apices are clear. Other: No fluid collection or hematoma. IMPRESSION: 1. Acute subdural hemorrhage along the left parietal convexity measuring 7 mm in thickness. 1-2 mm left right midline shift. 2.  No acute osseous injury of the cervical spine. Critical Value/emergent results were called by telephone at the time of interpretation on 08/23/2021 at 2:17 pm to provider [facility] , who verbally acknowledged these results. Electronically Signed   By: Kathreen Devoid M.D.   On: 08/23/2021 14:19   CT Hip Left Wo Contrast  Result Date: 08/23/2021 CLINICAL DATA:  Hip trauma.  Fracture suspected. EXAM: CT OF THE LEFT HIP WITHOUT CONTRAST TECHNIQUE: Multidetector CT imaging of the left hip was performed according to the standard protocol. Multiplanar CT image reconstructions were also generated. RADIATION DOSE REDUCTION: This exam was performed according to the departmental dose-optimization program which includes automated exposure control, adjustment of the mA and/or kV according to patient size and/or use of iterative reconstruction technique. COMPARISON:  Pelvis and left hip radiographs 08/23/2021 FINDINGS: Bones/Joint/Cartilage Severe left femoroacetabular osteoarthritis, greatest  within the posterosuperior aspect. Moderate subchondral sclerosis and subchondral degenerative cystic change. Moderate to high-grade left femoral head-neck junction and moderate peripheral left acetabular degenerative osteophytosis. Bridging degenerative osteophytes within the visualized portion of the inferior left sacroiliac joint. More mild endplate osteophytes of the right sacroiliac joint. Streak artifact from total right hip arthroplasty hardware. No perihardware lucency is seen to indicate hardware failure or loosening. No acute fracture is seen. Ligaments Suboptimally assessed by CT. Muscles and Tendons Grossly unremarkable. Soft tissues No left hip joint effusion. Moderate vascular calcifications are noted. Mild-to-moderate fat containing left  inguinal hernia. IMPRESSION:: IMPRESSION: 1. No left hip fracture is seen. 2. Severe left femoroacetabular osteoarthritis. 3. Status post total right hip arthroplasty without evidence of hardware failure. Electronically Signed   By: Yvonne Kendall M.D.   On: 08/23/2021 16:01   DG Chest Port 1 View  Result Date: 08/23/2021 CLINICAL DATA:  Neck and left hip pain, hematuria, fell EXAM: PORTABLE CHEST 1 VIEW COMPARISON:  03/19/2021 FINDINGS: Single frontal view of the chest demonstrates a stable cardiac silhouette. Stable ectasia and atherosclerosis of the thoracic aorta. No acute airspace disease, effusion, or pneumothorax. Chronic scarring at the left lung base. No acute bony abnormalities. IMPRESSION: 1. No acute intrathoracic process. Electronically Signed   By: Randa Ngo M.D.   On: 08/23/2021 14:59   EEG adult  Result Date: 08/26/2021 Clabe Seal, MD     08/26/2021  8:25 AM EEG - 08/25/2021 Indication - AMS Technical: This is a digitally recorded electroencephalogram. The international 10-20 electrode placement system is used for scalp electrode placement. Eighteen channels of scalp EEG are recorded The data are stored digitally and reviewed in  reformatted montages for optimal display. The patient is drowsy during the 43 minute EEG. Background of 5-6 Hz activity. No focal slowing was seen. No seizure like activity was observed during this recording. Impression: This EEG is abnormal. Diffuse slowing is seen, suggestive of a diffuse abnormality of the brain. This finding is nonspecific, and can be seen in a diffuse or multifocal abnormality of the brain. Clinical correlation is needed. The absence of interictal epileptiform abnormalities does not exclude the diagnosis of a seizure disorder   Overnight EEG with video  Result Date: 08/29/2021 Lora Havens, MD     08/29/2021 11:39 AM Patient Name: [place] Steidle MRN: [id number] Epilepsy Attending: Lora Havens Referring Physician/Provider: Donnetta Simpers, MD Duration: 08/28/2021 1835 to 08/29/2021 1030 Patient history: 88 year old male with left subdural hematoma noted to have an episode of confusion.  EEG to evaluate for seizures. Level of alertness: Awake, asleep AEDs during EEG study: Keppra Technical aspects: This EEG study was done with scalp electrodes positioned according to the 10-20 International system of electrode placement. Electrical activity was acquired at a sampling rate of 500Hz  and reviewed with a high frequency filter of 70Hz  and a low frequency filter of 1Hz . EEG data were recorded continuously and digitally stored. Description: The posterior dominant rhythm consists of 8 Hz activity of moderate voltage (25-35 uV) seen predominantly in posterior head regions, symmetric and reactive to eye opening and eye closing.  Sleep was characterized by vertex waves, sleep spindles (12 to 14 Hz), maximal frontocentral region.  EEG showed continuous generalized 5 to 6 Hz theta slowing. Hyperventilation and photic stimulation were not performed.   ABNORMALITY - Continuous slow, generalized IMPRESSION: This study is suggestive of mild diffuse encephalopathy, nonspecific etiology. No  seizures or epileptiform discharges were seen throughout the recording. Lora Havens   DG Hip Unilat W or Wo Pelvis 2-3 Views Left  Result Date: 08/23/2021 CLINICAL DATA:  LEFT hip pain after falling. EXAM: DG HIP (WITH OR WITHOUT PELVIS) 2-3V LEFT COMPARISON:  03/07/2016 FINDINGS: Status post RIGHT total hip arthroplasty. 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 There is marked degenerative change of the LEFT hip, with near complete obliteration of the joint space and large osteophytes, similar to prior study. There is no acute fracture or subluxation. Degenerative changes are seen in the lumbar spine. IMPRESSION: No evidence for acute abnormality. Significant degenerative changes of  the LEFT hip. Electronically Signed   By: Nolon Nations M.D.   On: 08/23/2021 14:25   CT HEAD CODE STROKE WO CONTRAST  Result Date: 08/25/2021 CLINICAL DATA:  Code stroke.  Nonresponsive, fixed gaze EXAM: CT HEAD WITHOUT CONTRAST TECHNIQUE: Contiguous axial images were obtained from the base of the skull through the vertex without intravenous contrast. RADIATION DOSE REDUCTION: This exam was performed according to the departmental dose-optimization program which includes automated exposure control, adjustment of the mA and/or kV according to patient size and/or use of iterative reconstruction technique. COMPARISON:  08/24/2021. FINDINGS: Evaluation is limited by motion artifact, despite repeat imaging. Brain: Left subdural hematoma is again noted, which appears unchanged, although evaluation is limited by motion. No new extra-axial collection. Redemonstrated remote infarcts in the right occipital lobe and right cerebellum. No definite new cortical infarct. No acute parenchymal hemorrhage, mass, mass effect, or significant midline shift. Unchanged size and configuration of the ventricles. Vascular: No definite hyperdense vessel. Skull: No definite acute fracture or focal lesion. Sinuses/Orbits: Mucosal thickening  throughout the paranasal sinuses. The orbits are grossly unremarkable. Other: None. ASPECTS [facility] Stroke Program Early CT Score) - Ganglionic level infarction (caudate, lentiform nuclei, internal capsule, insula, M1-M3 cortex): 7 - Supraganglionic infarction (M4-M6 cortex): 3 Total score (0-10 with 10 being normal): 10 IMPRESSION: 1. Evaluation is significantly limited by motion artifact, despite repeat imaging. Within this limitation, no acute intracranial process. Grossly unchanged size of a left subdural hematoma. 2. ASPECTS is 10 Code stroke imaging results were communicated on 08/25/2021 at 5:03 pm to provider Dr. Lorrin Goodell via secure text paging. Electronically Signed   By: Merilyn Baba M.D.   On: 08/25/2021 17:04     Treatments: IV hydration, therapies: PT, OT, and ST, and antiepileptic medication  Discharge Exam: Blood pressure 139/90, pulse 96, temperature 98.2 F (36.8 C), temperature source Oral, resp. rate 16, height 5' 10.5" (1.791 m), weight 74.8 kg, SpO2 97 %.  Responds to voice, oriented x 4 PERRLA Speech clear CN II-XII grossly intact Face with erythema, dry, flaky MAE, Sensation intact, generalized weakness Foley catheter in place  Disposition: Discharge disposition: 03-Skilled Nursing Facility       Discharge Instructions     Allergies as of 08/31/2021   No Known Allergies      Medication List     STOP taking these medications    Acetaminophen-Codeine 300-30 MG tablet   ADVIL PO   apixaban 5 MG Tabs tablet Commonly known as: Eliquis   budesonide-formoterol 80-4.5 MCG/ACT inhaler Commonly known as: SYMBICORT   clobetasol ointment 0.05 % Commonly known as: TEMOVATE   omega-3 acid ethyl esters 1 g capsule Commonly known as: LOVAZA   polyethylene glycol 17 g packet Commonly known as: MIRALAX / GLYCOLAX   prochlorperazine 10 MG tablet Commonly known as: COMPAZINE   sildenafil 100 MG tablet Commonly known as: VIAGRA   Vitamin D  (Ergocalciferol) 1.25 MG (50000 UNIT) Caps capsule Commonly known as: DRISDOL       TAKE these medications    atorvastatin 80 MG tablet Commonly known as: LIPITOR TAKE 1 TABLET DAILY   diclofenac sodium 1 % Gel Commonly known as: VOLTAREN APPLY 4 GRAMS TOPICALLY FOUR TIMES A DAY AS NEEDED FOR KNEE PAIN What changed: See the new instructions.   divalproex 500 MG DR tablet Commonly known as: DEPAKOTE Take 1 tablet (500 mg total) by mouth every 12 (twelve) hours.   hydrochlorothiazide 12.5 MG capsule Commonly known as: MICROZIDE Take 1 capsule by mouth  daily.   HYDROcodone-acetaminophen 5-325 MG tablet Commonly known as: NORCO/VICODIN Take 1 tablet by mouth every 6 (six) hours as needed for moderate pain or severe pain.   ketoconazole 2 % cream Commonly known as: NIZORAL Apply 1 application topically 2 (two) times daily as needed for irritation.   metoprolol tartrate 50 MG tablet Commonly known as: LOPRESSOR Take 50 mg by mouth 2 (two) times daily.   tamsulosin 0.4 MG Caps capsule Commonly known as: FLOMAX Take 1 capsule (0.4 mg total) by mouth daily.   Tricor 48 MG tablet Generic drug: fenofibrate TAKE 1 TABLET DAILY What changed: how much to take               Durable Medical Equipment  (From admission, onward)           Start     Ordered   08/25/21 1156  For home use only DME Walker rolling  Once       Question Answer Comment  Walker: With 5 Inch Wheels   Patient needs a walker to treat with the following condition Weakness      08/25/21 1155   08/25/21 1156  For home use only DME 3 n 1  Once        08/25/21 1155            Follow-up Information     [facility]. Schedule an appointment as soon as possible for a visit in 1 week(s).   Why: Catheter removal Contact information: [place]        Earnie Larsson, MD. Schedule an appointment as soon as possible for a  visit in 2 week(s).   Specialty: Neurosurgery Contact information: [address] [place] [address] [place] Follow up.   Why: [facility] Contact information: [place], [place] [facility] Follow up.   Why: Durable Medical Equipment. Contact information: [address] [place] [address] [phone]                 Signed: Viona Gilmore, DNP, AGNP-C Nurse Practitioner  [facility] [place] [address], [address], [place], [place] [address] P: [phone]     F: [phone]  08/31/2021, 1:07 PM

## 2021-08-30 NOTE — Progress Notes (Addendum)
Providing Compassionate, Quality Care - Together   Subjective: Patient reports he would like to discharge to SNF today. Nurse reports Foley catheter was removed at 6:00 a.m. The patient has not voided since removal.  Bladder scan performed by RN demonstrated 159 mL. Patient with redness and itching on face.  Objective: Vital signs in last 24 hours: Temp:  [98.2 F (36.8 C)-98.9 F (37.2 C)] 98.2 F (36.8 C) (01/24 0744) Pulse Rate:  [85-103] 96 (01/24 0744) Resp:  [16-20] 16 (01/24 0744) BP: (139-169)/(69-99) 139/90 (01/24 0744) SpO2:  [95 %-98 %] 97 % (01/24 0744)  Intake/Output from previous day: 01/23 0701 - 01/24 0700 In: 240 [P.O.:240] Out: 250 [Urine:250] Intake/Output this shift: No intake/output data recorded.  Responds to voice, oriented x 4 PERRLA Speech clear CN II-XII grossly intact Face with erythema, dry, flaky MAE, Sensation intact, generalized weakness  Lab Results: No results for input(s): WBC, HGB, HCT, PLT in the last 72 hours. BMET No results for input(s): NA, K, CL, CO2, GLUCOSE, BUN, CREATININE, CALCIUM in the last 72 hours.  Studies/Results: Overnight EEG with video  Result Date: 08/29/2021 Lora Havens, MD     08/29/2021 11:39 AM Patient Name: Brendan Long MRN: [id number] Epilepsy Attending: Lora Havens Referring Physician/Provider: Donnetta Simpers, MD Duration: 08/28/2021 1835 to 08/29/2021 1030 Patient history: 88 year old male with left subdural hematoma noted to have an episode of confusion.  EEG to evaluate for seizures. Level of alertness: Awake, asleep AEDs during EEG study: Keppra Technical aspects: This EEG study was done with scalp electrodes positioned according to the 10-20 International system of electrode placement. Electrical activity was acquired at a sampling rate of 500Hz  and reviewed with a high frequency filter of 70Hz  and a low frequency filter of 1Hz . EEG data were recorded continuously and digitally stored.  Description: The posterior dominant rhythm consists of 8 Hz activity of moderate voltage (25-35 uV) seen predominantly in posterior head regions, symmetric and reactive to eye opening and eye closing.  Sleep was characterized by vertex waves, sleep spindles (12 to 14 Hz), maximal frontocentral region.  EEG showed continuous generalized 5 to 6 Hz theta slowing. Hyperventilation and photic stimulation were not performed.   ABNORMALITY - Continuous slow, generalized IMPRESSION: This study is suggestive of mild diffuse encephalopathy, nonspecific etiology. No seizures or epileptiform discharges were seen throughout the recording. Lora Havens    Assessment/Plan: Patient suffered a fall from ground level on 08/23/2021, sustaining a small subdural hematoma. He was admitted to 4[facility] ICU for observation. Follow up imaging performed on 08/24/2021 was stable, showing a small volume left convexity subdural hematoma without mass-effect. He developed urinary retention. An indwelling catheter had to be placed by Urology and the patient was started on Flomax. Patient was to follow up with urology in 7 days. Patient was to discharge on 08/25/2021, but had what appears to be a seizure and discharge was canceled. Another episode occurred on 08/28/2021. Stroke was ruled out again. Appeared to be a hypotensive event. Therapies now feel that patient would do better with discharge to SNF as the patient would not be able to participate in three hours of therapies daily. Foley catheter removed early AM on 08/30/2020.   LOS: 7 days   -Patient can discharge to SNF once he voids -Continue to encourage mobilization.   Viona Gilmore, DNP, AGNP-C Nurse Practitioner  [facility] [place] [address], [place], [place] [address] P: [phone]     F: [phone]  08/30/2021, 11:50 AM

## 2021-08-30 NOTE — Progress Notes (Signed)
Pt confused, called 911 twice that he needed to "get out of here", when asked, he stated he came here last night. Will pass on to oncoming shift

## 2021-08-31 DIAGNOSIS — E785 Hyperlipidemia, unspecified: Secondary | ICD-10-CM | POA: Diagnosis not present

## 2021-08-31 DIAGNOSIS — Z7401 Bed confinement status: Secondary | ICD-10-CM | POA: Diagnosis not present

## 2021-08-31 DIAGNOSIS — R29709 NIHSS score 9: Secondary | ICD-10-CM | POA: Diagnosis present

## 2021-08-31 DIAGNOSIS — G8194 Hemiplegia, unspecified affecting left nondominant side: Secondary | ICD-10-CM | POA: Diagnosis present

## 2021-08-31 DIAGNOSIS — S065X0A Traumatic subdural hemorrhage without loss of consciousness, initial encounter: Secondary | ICD-10-CM | POA: Diagnosis not present

## 2021-08-31 DIAGNOSIS — R638 Other symptoms and signs concerning food and fluid intake: Secondary | ICD-10-CM | POA: Diagnosis not present

## 2021-08-31 DIAGNOSIS — Z66 Do not resuscitate: Secondary | ICD-10-CM | POA: Diagnosis present

## 2021-08-31 DIAGNOSIS — G9341 Metabolic encephalopathy: Secondary | ICD-10-CM | POA: Diagnosis present

## 2021-08-31 DIAGNOSIS — I480 Paroxysmal atrial fibrillation: Secondary | ICD-10-CM | POA: Diagnosis present

## 2021-08-31 DIAGNOSIS — I63412 Cerebral infarction due to embolism of left middle cerebral artery: Secondary | ICD-10-CM | POA: Diagnosis not present

## 2021-08-31 DIAGNOSIS — Z515 Encounter for palliative care: Secondary | ICD-10-CM | POA: Diagnosis not present

## 2021-08-31 DIAGNOSIS — I6522 Occlusion and stenosis of left carotid artery: Secondary | ICD-10-CM | POA: Diagnosis present

## 2021-08-31 DIAGNOSIS — Z7189 Other specified counseling: Secondary | ICD-10-CM | POA: Diagnosis not present

## 2021-08-31 DIAGNOSIS — S065XAA Traumatic subdural hemorrhage with loss of consciousness status unknown, initial encounter: Secondary | ICD-10-CM | POA: Diagnosis present

## 2021-08-31 DIAGNOSIS — E1122 Type 2 diabetes mellitus with diabetic chronic kidney disease: Secondary | ICD-10-CM | POA: Diagnosis present

## 2021-08-31 DIAGNOSIS — G9340 Encephalopathy, unspecified: Secondary | ICD-10-CM | POA: Diagnosis not present

## 2021-08-31 DIAGNOSIS — R55 Syncope and collapse: Secondary | ICD-10-CM | POA: Diagnosis not present

## 2021-08-31 DIAGNOSIS — G319 Degenerative disease of nervous system, unspecified: Secondary | ICD-10-CM | POA: Diagnosis not present

## 2021-08-31 DIAGNOSIS — R4781 Slurred speech: Secondary | ICD-10-CM | POA: Diagnosis not present

## 2021-08-31 DIAGNOSIS — G4089 Other seizures: Secondary | ICD-10-CM | POA: Diagnosis present

## 2021-08-31 DIAGNOSIS — R8290 Unspecified abnormal findings in urine: Secondary | ICD-10-CM | POA: Diagnosis not present

## 2021-08-31 DIAGNOSIS — W1830XA Fall on same level, unspecified, initial encounter: Secondary | ICD-10-CM | POA: Diagnosis present

## 2021-08-31 DIAGNOSIS — I6603 Occlusion and stenosis of bilateral middle cerebral arteries: Secondary | ICD-10-CM | POA: Diagnosis not present

## 2021-08-31 DIAGNOSIS — I621 Nontraumatic extradural hemorrhage: Secondary | ICD-10-CM | POA: Diagnosis not present

## 2021-08-31 DIAGNOSIS — I639 Cerebral infarction, unspecified: Secondary | ICD-10-CM | POA: Diagnosis not present

## 2021-08-31 DIAGNOSIS — I6381 Other cerebral infarction due to occlusion or stenosis of small artery: Secondary | ICD-10-CM | POA: Diagnosis present

## 2021-08-31 DIAGNOSIS — R4701 Aphasia: Secondary | ICD-10-CM | POA: Diagnosis present

## 2021-08-31 DIAGNOSIS — R627 Adult failure to thrive: Secondary | ICD-10-CM | POA: Diagnosis present

## 2021-08-31 DIAGNOSIS — R0902 Hypoxemia: Secondary | ICD-10-CM | POA: Diagnosis not present

## 2021-08-31 DIAGNOSIS — I6200 Nontraumatic subdural hemorrhage, unspecified: Secondary | ICD-10-CM | POA: Diagnosis not present

## 2021-08-31 DIAGNOSIS — M25569 Pain in unspecified knee: Secondary | ICD-10-CM | POA: Diagnosis not present

## 2021-08-31 DIAGNOSIS — N529 Male erectile dysfunction, unspecified: Secondary | ICD-10-CM | POA: Diagnosis not present

## 2021-08-31 DIAGNOSIS — R471 Dysarthria and anarthria: Secondary | ICD-10-CM | POA: Diagnosis present

## 2021-08-31 DIAGNOSIS — E039 Hypothyroidism, unspecified: Secondary | ICD-10-CM | POA: Diagnosis present

## 2021-08-31 DIAGNOSIS — Z741 Need for assistance with personal care: Secondary | ICD-10-CM | POA: Diagnosis not present

## 2021-08-31 DIAGNOSIS — I6502 Occlusion and stenosis of left vertebral artery: Secondary | ICD-10-CM | POA: Diagnosis not present

## 2021-08-31 DIAGNOSIS — Z711 Person with feared health complaint in whom no diagnosis is made: Secondary | ICD-10-CM | POA: Diagnosis not present

## 2021-08-31 DIAGNOSIS — I63231 Cerebral infarction due to unspecified occlusion or stenosis of right carotid arteries: Secondary | ICD-10-CM | POA: Diagnosis not present

## 2021-08-31 DIAGNOSIS — R41841 Cognitive communication deficit: Secondary | ICD-10-CM | POA: Diagnosis not present

## 2021-08-31 DIAGNOSIS — R4182 Altered mental status, unspecified: Secondary | ICD-10-CM | POA: Diagnosis not present

## 2021-08-31 DIAGNOSIS — R300 Dysuria: Secondary | ICD-10-CM | POA: Diagnosis not present

## 2021-08-31 DIAGNOSIS — I6389 Other cerebral infarction: Secondary | ICD-10-CM | POA: Diagnosis not present

## 2021-08-31 DIAGNOSIS — R0981 Nasal congestion: Secondary | ICD-10-CM | POA: Diagnosis not present

## 2021-08-31 DIAGNOSIS — R404 Transient alteration of awareness: Secondary | ICD-10-CM | POA: Diagnosis not present

## 2021-08-31 DIAGNOSIS — R58 Hemorrhage, not elsewhere classified: Secondary | ICD-10-CM | POA: Diagnosis not present

## 2021-08-31 DIAGNOSIS — B964 Proteus (mirabilis) (morganii) as the cause of diseases classified elsewhere: Secondary | ICD-10-CM | POA: Diagnosis present

## 2021-08-31 DIAGNOSIS — R1310 Dysphagia, unspecified: Secondary | ICD-10-CM | POA: Diagnosis not present

## 2021-08-31 DIAGNOSIS — R338 Other retention of urine: Secondary | ICD-10-CM | POA: Diagnosis not present

## 2021-08-31 DIAGNOSIS — R2681 Unsteadiness on feet: Secondary | ICD-10-CM | POA: Diagnosis not present

## 2021-08-31 DIAGNOSIS — R531 Weakness: Secondary | ICD-10-CM | POA: Diagnosis not present

## 2021-08-31 DIAGNOSIS — R296 Repeated falls: Secondary | ICD-10-CM | POA: Diagnosis present

## 2021-08-31 DIAGNOSIS — Z20822 Contact with and (suspected) exposure to covid-19: Secondary | ICD-10-CM | POA: Diagnosis present

## 2021-08-31 DIAGNOSIS — I129 Hypertensive chronic kidney disease with stage 1 through stage 4 chronic kidney disease, or unspecified chronic kidney disease: Secondary | ICD-10-CM | POA: Diagnosis present

## 2021-08-31 DIAGNOSIS — R5381 Other malaise: Secondary | ICD-10-CM | POA: Diagnosis not present

## 2021-08-31 DIAGNOSIS — M6281 Muscle weakness (generalized): Secondary | ICD-10-CM | POA: Diagnosis not present

## 2021-08-31 DIAGNOSIS — S065XAD Traumatic subdural hemorrhage with loss of consciousness status unknown, subsequent encounter: Secondary | ICD-10-CM | POA: Diagnosis not present

## 2021-08-31 DIAGNOSIS — R339 Retention of urine, unspecified: Secondary | ICD-10-CM | POA: Diagnosis not present

## 2021-08-31 DIAGNOSIS — N289 Disorder of kidney and ureter, unspecified: Secondary | ICD-10-CM | POA: Diagnosis not present

## 2021-08-31 DIAGNOSIS — N183 Chronic kidney disease, stage 3 unspecified: Secondary | ICD-10-CM | POA: Diagnosis not present

## 2021-08-31 DIAGNOSIS — Z9181 History of falling: Secondary | ICD-10-CM | POA: Diagnosis not present

## 2021-08-31 DIAGNOSIS — R310 Gross hematuria: Secondary | ICD-10-CM | POA: Diagnosis not present

## 2021-08-31 DIAGNOSIS — I63331 Cerebral infarction due to thrombosis of right posterior cerebral artery: Secondary | ICD-10-CM | POA: Diagnosis not present

## 2021-08-31 DIAGNOSIS — N189 Chronic kidney disease, unspecified: Secondary | ICD-10-CM | POA: Diagnosis not present

## 2021-08-31 DIAGNOSIS — R509 Fever, unspecified: Secondary | ICD-10-CM | POA: Diagnosis not present

## 2021-08-31 DIAGNOSIS — Z8673 Personal history of transient ischemic attack (TIA), and cerebral infarction without residual deficits: Secondary | ICD-10-CM | POA: Diagnosis not present

## 2021-08-31 DIAGNOSIS — I6523 Occlusion and stenosis of bilateral carotid arteries: Secondary | ICD-10-CM | POA: Diagnosis not present

## 2021-08-31 DIAGNOSIS — R29810 Facial weakness: Secondary | ICD-10-CM | POA: Diagnosis not present

## 2021-08-31 DIAGNOSIS — Z466 Encounter for fitting and adjustment of urinary device: Secondary | ICD-10-CM | POA: Diagnosis not present

## 2021-08-31 DIAGNOSIS — L409 Psoriasis, unspecified: Secondary | ICD-10-CM | POA: Diagnosis not present

## 2021-08-31 DIAGNOSIS — E559 Vitamin D deficiency, unspecified: Secondary | ICD-10-CM | POA: Diagnosis not present

## 2021-08-31 DIAGNOSIS — N1832 Chronic kidney disease, stage 3b: Secondary | ICD-10-CM | POA: Diagnosis present

## 2021-08-31 DIAGNOSIS — I10 Essential (primary) hypertension: Secondary | ICD-10-CM | POA: Diagnosis not present

## 2021-08-31 DIAGNOSIS — M549 Dorsalgia, unspecified: Secondary | ICD-10-CM | POA: Diagnosis not present

## 2021-08-31 DIAGNOSIS — G8929 Other chronic pain: Secondary | ICD-10-CM | POA: Diagnosis not present

## 2021-08-31 DIAGNOSIS — N179 Acute kidney failure, unspecified: Secondary | ICD-10-CM | POA: Diagnosis present

## 2021-08-31 DIAGNOSIS — N390 Urinary tract infection, site not specified: Secondary | ICD-10-CM | POA: Diagnosis present

## 2021-08-31 DIAGNOSIS — F918 Other conduct disorders: Secondary | ICD-10-CM | POA: Diagnosis not present

## 2021-08-31 DIAGNOSIS — M1990 Unspecified osteoarthritis, unspecified site: Secondary | ICD-10-CM | POA: Diagnosis not present

## 2021-08-31 DIAGNOSIS — R569 Unspecified convulsions: Secondary | ICD-10-CM | POA: Diagnosis not present

## 2021-08-31 DIAGNOSIS — Z789 Other specified health status: Secondary | ICD-10-CM | POA: Diagnosis not present

## 2021-08-31 MED ORDER — CHLORHEXIDINE GLUCONATE CLOTH 2 % EX PADS
6.0000 | MEDICATED_PAD | Freq: Every day | CUTANEOUS | Status: DC
  Administered 2021-08-31: 13:00:00 6 via TOPICAL

## 2021-08-31 MED ORDER — DIVALPROEX SODIUM 500 MG PO DR TAB: 500.0000 mg | DELAYED_RELEASE_TABLET | Freq: Two times a day (BID) | ORAL | 1 refills | Status: DC

## 2021-08-31 NOTE — Progress Notes (Signed)
Bladder scanned pt with 427 ml urine in bladder, attempted to I & out but resistance is met, neurosurgery on call notified, Dr. Reinaldo Meeker returned the page and not to attempt to I & O but to wait for neurology in am, pt had some small void earlier in the night which had some bloody stains. Pt resting at the moment

## 2021-08-31 NOTE — TOC Transition Note (Signed)
Transition of Care [facility]) - CM/SW Discharge Note   Patient Details  Name: Brendan Long MRN: [id number] Date of Birth: Dec 30, 1928  Transition of Care [facility]) CM/SW Contact:  Geralynn Ochs, LCSW Phone Number: 08/31/2021, 1:34 PM   Clinical Narrative:   Nurse to call report to [phone], Room 36    Final next level of care: Skilled Nursing Facility Barriers to Discharge: Barriers Resolved   Patient Goals and CMS Choice   CMS Medicare.gov Compare Post Acute Care list provided to:: Patient Represenative (must comment) Choice offered to / list presented to : Adult Children  Discharge Placement              Patient chooses bed at: [facility] Patient to be transferred to facility by: [facility] Name of family member notified: Left a voicemail for [facility] Patient and family notified of of transfer: 08/31/21  Discharge Plan and Services   Discharge Planning Services: CM Consult            DME Arranged: Gilford Rile, 3-N-1 DME Agency: [employer] Date DME Agency Contacted: 08/25/21 Time DME Agency Contacted: [phone] Representative spoke with at DME Agency: [place]: PT, OT [place] Agency: [place] Date [place]: 08/25/21 Time [place]: [place] Representative spoke with at [place]: [place] ([place]) Interventions     Readmission Risk Interventions Readmission Risk Prevention Plan 03/20/2019  Post Dischage Appt Complete  Medication Screening Complete  Transportation Screening Complete  Some recent data might be hidden

## 2021-08-31 NOTE — Progress Notes (Signed)
Physical Therapy Treatment Patient Details Name: Brendan Long MRN: [id number] DOB: Dec 14, 1928 Today's Date: 08/31/2021   History of Present Illness 88 y/o male admitted 1/17  s/p fall from standing height sustaning small L SDH with no mass effect. 1/20 unresponsive with rapid repsonse called noted to have abnormal EEG PMH HTN, Stroke, BIL THR's, rotator cuff repair R shoulder.    PT Comments    Pt agreeable to ambulate. Pt slower to move, less impulsive today. Pt with slower gait pattern but wore sneakers today. Pt continues to require verbal cues for wayfinding and for walker safety but improved from previous sessions. Acute PT to cont to follow.    Recommendations for follow up therapy are one component of a multi-disciplinary discharge planning process, led by the attending physician.  Recommendations may be updated based on patient status, additional functional criteria and insurance authorization.  Follow Up Recommendations  Skilled nursing-short term rehab (<3 hours/day)     Assistance Recommended at Discharge Frequent or constant Supervision/Assistance  Patient can return home with the following A little help with walking and/or transfers;A little help with bathing/dressing/bathroom;Assistance with cooking/housework;Assist for transportation;Direct supervision/assist for medications management;Direct supervision/assist for financial management;Help with stairs or ramp for entrance   Equipment Recommendations  Rolling walker (2 wheels)    Recommendations for Other Services       Precautions / Restrictions Precautions Precautions: Fall Restrictions Weight Bearing Restrictions: No     Mobility  Bed Mobility Overal bed mobility: Needs Assistance Bed Mobility: Supine to Sit     Supine to sit: Min assist     General bed mobility comments: assist to take blankets off feet, HOB elevated, incresaed time, verbal cues to complete task    Transfers Overall transfer  level: Needs assistance Equipment used: Rolling walker (2 wheels) Transfers: Sit to/from Stand Sit to Stand: Min assist           General transfer comment: max verbal and tactile cues for safe  hand placement, minA for walker management, incresaed time, minA to power up    Ambulation/Gait Ambulation/Gait assistance: Min assist Gait Distance (Feet): 120 Feet Assistive device: Rolling walker (2 wheels) Gait Pattern/deviations: Step-through pattern, Decreased stride length, Shuffle Gait velocity: dec Gait velocity interpretation: <1.31 ft/sec, indicative of household ambulator   General Gait Details: pt with improved walker managment today however shorter step length and height and decresaed speed, pt wearing sneakers today which may be heavier limiting step height, max directional cues for wayfinding, pt with noted L inattention as well   Chief Strategy Officer    Modified Rankin (Stroke Patients Only) Modified Rankin (Stroke Patients Only) Pre-Morbid Rankin Score: Slight disability Modified Rankin: Moderate disability     Balance Overall balance assessment: Needs assistance Sitting-balance support: Feet supported Sitting balance-Leahy Scale: Fair Sitting balance - Comments: attempted to don sneakers at EOB however ultimately required modA to don and dependent for tieing shoes   Standing balance support: Bilateral upper extremity supported, During functional activity, Reliant on assistive device for balance Standing balance-Leahy Scale: Poor Standing balance comment: RW needed for dynamic standing                            Cognition Arousal/Alertness: Awake/alert Behavior During Therapy: Impulsive, WFL for tasks assessed/performed Overall Cognitive Status: Impaired/Different from baseline Area of Impairment: Attention, Memory, Following commands, Safety/judgement, Awareness, Problem solving  Current Attention  Level: Selective Memory: Decreased recall of precautions, Decreased short-term memory Following Commands: Follows one step commands with increased time Safety/Judgement: Decreased awareness of safety, Decreased awareness of deficits Awareness: Intellectual Problem Solving: Slow processing, Requires verbal cues, Requires tactile cues General Comments: pt aware he is going to a SNF, not oriented to date but suspect this is baseline        Exercises      General Comments General comments (skin integrity, edema, etc.): pt with dry flaky skin around face and forehead      Pertinent Vitals/Pain Pain Assessment Pain Assessment: No/denies pain    Home Living                          Prior Function            PT Goals (current goals can now be found in the care plan section) Acute Rehab PT Goals Patient Stated Goal: I want to get home PT Goal Formulation: With patient Time For Goal Achievement: 09/01/21 Potential to Achieve Goals: Good Progress towards PT goals: Progressing toward goals    Frequency    Min 3X/week      PT Plan Discharge plan needs to be updated    Co-evaluation              AM-PAC PT "6 Clicks" Mobility   Outcome Measure  Help needed turning from your back to your side while in a flat bed without using bedrails?: A Little Help needed moving from lying on your back to sitting on the side of a flat bed without using bedrails?: A Little Help needed moving to and from a bed to a chair (including a wheelchair)?: A Little Help needed standing up from a chair using your arms (e.g., wheelchair or bedside chair)?: A Little Help needed to walk in hospital room?: A Lot (mod cues) Help needed climbing 3-5 steps with a railing? : A Lot (mod cues) 6 Click Score: 16    End of Session Equipment Utilized During Treatment: Gait belt Activity Tolerance: Patient tolerated treatment well Patient left: in chair;with call bell/phone within reach;with  chair alarm set;with family/visitor present Nurse Communication: Mobility status PT Visit Diagnosis: Other abnormalities of gait and mobility (R26.89);Muscle weakness (generalized) (M62.81);Difficulty in walking, not elsewhere classified (R26.2)     Time: 2992-4268 PT Time Calculation (min) (ACUTE ONLY): 27 min  Charges:  $Gait Training: 8-22 mins $Therapeutic Activity: 8-22 mins                     Kittie Plater, PT, DPT Acute Rehabilitation Services Pager #: [phone] Office #: [phone]    Berline Lopes 08/31/2021, 10:05 AM

## 2021-08-31 NOTE — Progress Notes (Signed)
[facility] arrived to transport patient, discharge packet given for receiving facility. IV removed. Patient belongings (dentures, cell phone w charger, clothing, and shoes) packed and sent with patient. Report called to [facility], all questions answered.   Gwendolyn Grant, RN

## 2021-08-31 NOTE — TOC Progression Note (Signed)
Transition of Care [facility]) - Progression Note    Patient Details  Name: Brendan Long MRN: [id number] Date of Birth: 21-Jan-1929  Transition of Care [facility]) CM/SW [place], [place] Phone Number: 08/31/2021, 10:47 AM  Clinical Narrative:   CSW confirmed bed offer at [facility], and spoke with daughter to confirm accepting bed offer. Bed available today, but patient not medically ready for discharge. Hopeful for discharge tomorrow pending medical workup. CSW to follow.    Expected Discharge Plan: Skilled Nursing Facility Barriers to Discharge: Continued Medical Work up  Expected Discharge Plan and Services Expected Discharge Plan: [place]   Discharge Planning Services: CM Consult     Expected Discharge Date: 08/30/21               DME Arranged: Gilford Rile, 3-N-1 DME Agency: [employer] Date DME Agency Contacted: 08/25/21 Time DME Agency Contacted: [phone] Representative spoke with at DME Agency: [place]: PT, OT [place] Agency: [place] Date [place]: 08/25/21 Time [place]: [place] Representative spoke with at [place]: [place] ([place]) Interventions    Readmission Risk Interventions Readmission Risk Prevention Plan 03/20/2019  Post Dischage Appt Complete  Medication Screening Complete  Transportation Screening Complete  Some recent data might be hidden

## 2021-09-01 DIAGNOSIS — R5381 Other malaise: Secondary | ICD-10-CM | POA: Diagnosis not present

## 2021-09-01 DIAGNOSIS — E785 Hyperlipidemia, unspecified: Secondary | ICD-10-CM | POA: Diagnosis not present

## 2021-09-01 DIAGNOSIS — S065XAD Traumatic subdural hemorrhage with loss of consciousness status unknown, subsequent encounter: Secondary | ICD-10-CM | POA: Diagnosis not present

## 2021-09-01 DIAGNOSIS — I10 Essential (primary) hypertension: Secondary | ICD-10-CM | POA: Diagnosis not present

## 2021-09-05 DIAGNOSIS — S065XAD Traumatic subdural hemorrhage with loss of consciousness status unknown, subsequent encounter: Secondary | ICD-10-CM | POA: Diagnosis not present

## 2021-09-05 DIAGNOSIS — G8929 Other chronic pain: Secondary | ICD-10-CM | POA: Diagnosis not present

## 2021-09-05 DIAGNOSIS — M1990 Unspecified osteoarthritis, unspecified site: Secondary | ICD-10-CM | POA: Diagnosis not present

## 2021-09-05 DIAGNOSIS — E785 Hyperlipidemia, unspecified: Secondary | ICD-10-CM | POA: Diagnosis not present

## 2021-09-05 DIAGNOSIS — R5381 Other malaise: Secondary | ICD-10-CM | POA: Diagnosis not present

## 2021-09-05 DIAGNOSIS — R339 Retention of urine, unspecified: Secondary | ICD-10-CM | POA: Diagnosis not present

## 2021-09-05 DIAGNOSIS — R0981 Nasal congestion: Secondary | ICD-10-CM | POA: Diagnosis not present

## 2021-09-05 DIAGNOSIS — I10 Essential (primary) hypertension: Secondary | ICD-10-CM | POA: Diagnosis not present

## 2021-09-07 DIAGNOSIS — R338 Other retention of urine: Secondary | ICD-10-CM | POA: Diagnosis not present

## 2021-09-07 DIAGNOSIS — R310 Gross hematuria: Secondary | ICD-10-CM | POA: Diagnosis not present

## 2021-09-09 ENCOUNTER — Other Ambulatory Visit: Payer: Medicare Other

## 2021-09-12 ENCOUNTER — Other Ambulatory Visit: Payer: Self-pay

## 2021-09-12 ENCOUNTER — Observation Stay (HOSPITAL_COMMUNITY): Payer: Medicare Other

## 2021-09-12 ENCOUNTER — Encounter (HOSPITAL_COMMUNITY): Payer: Self-pay

## 2021-09-12 ENCOUNTER — Emergency Department (HOSPITAL_COMMUNITY): Payer: Medicare Other

## 2021-09-12 ENCOUNTER — Inpatient Hospital Stay (HOSPITAL_COMMUNITY)
Admission: EM | Admit: 2021-09-12 | Discharge: 2021-09-15 | DRG: 064 | Disposition: A | Discharge status: Hospice | Payer: Medicare Other | Source: Skilled Nursing Facility | Attending: Internal Medicine | Admitting: Internal Medicine

## 2021-09-12 DIAGNOSIS — G9340 Encephalopathy, unspecified: Secondary | ICD-10-CM | POA: Diagnosis present

## 2021-09-12 DIAGNOSIS — R509 Fever, unspecified: Secondary | ICD-10-CM | POA: Diagnosis not present

## 2021-09-12 DIAGNOSIS — R4182 Altered mental status, unspecified: Secondary | ICD-10-CM | POA: Diagnosis not present

## 2021-09-12 DIAGNOSIS — S065X0A Traumatic subdural hemorrhage without loss of consciousness, initial encounter: Secondary | ICD-10-CM | POA: Diagnosis not present

## 2021-09-12 DIAGNOSIS — R471 Dysarthria and anarthria: Secondary | ICD-10-CM | POA: Diagnosis present

## 2021-09-12 DIAGNOSIS — E785 Hyperlipidemia, unspecified: Secondary | ICD-10-CM | POA: Diagnosis present

## 2021-09-12 DIAGNOSIS — Z803 Family history of malignant neoplasm of breast: Secondary | ICD-10-CM

## 2021-09-12 DIAGNOSIS — L409 Psoriasis, unspecified: Secondary | ICD-10-CM | POA: Diagnosis present

## 2021-09-12 DIAGNOSIS — R638 Other symptoms and signs concerning food and fluid intake: Secondary | ICD-10-CM | POA: Diagnosis not present

## 2021-09-12 DIAGNOSIS — W1830XA Fall on same level, unspecified, initial encounter: Secondary | ICD-10-CM | POA: Diagnosis present

## 2021-09-12 DIAGNOSIS — I6502 Occlusion and stenosis of left vertebral artery: Secondary | ICD-10-CM | POA: Diagnosis not present

## 2021-09-12 DIAGNOSIS — G8194 Hemiplegia, unspecified affecting left nondominant side: Secondary | ICD-10-CM | POA: Diagnosis present

## 2021-09-12 DIAGNOSIS — I6389 Other cerebral infarction: Secondary | ICD-10-CM | POA: Diagnosis not present

## 2021-09-12 DIAGNOSIS — R338 Other retention of urine: Secondary | ICD-10-CM | POA: Diagnosis not present

## 2021-09-12 DIAGNOSIS — I6381 Other cerebral infarction due to occlusion or stenosis of small artery: Secondary | ICD-10-CM | POA: Diagnosis present

## 2021-09-12 DIAGNOSIS — I6522 Occlusion and stenosis of left carotid artery: Secondary | ICD-10-CM | POA: Diagnosis present

## 2021-09-12 DIAGNOSIS — Z7189 Other specified counseling: Secondary | ICD-10-CM | POA: Diagnosis not present

## 2021-09-12 DIAGNOSIS — R4701 Aphasia: Secondary | ICD-10-CM | POA: Diagnosis present

## 2021-09-12 DIAGNOSIS — N179 Acute kidney failure, unspecified: Secondary | ICD-10-CM | POA: Diagnosis not present

## 2021-09-12 DIAGNOSIS — R2990 Unspecified symptoms and signs involving the nervous system: Secondary | ICD-10-CM

## 2021-09-12 DIAGNOSIS — Z79899 Other long term (current) drug therapy: Secondary | ICD-10-CM

## 2021-09-12 DIAGNOSIS — R29709 NIHSS score 9: Secondary | ICD-10-CM | POA: Diagnosis present

## 2021-09-12 DIAGNOSIS — Z20822 Contact with and (suspected) exposure to covid-19: Secondary | ICD-10-CM | POA: Diagnosis present

## 2021-09-12 DIAGNOSIS — Z8673 Personal history of transient ischemic attack (TIA), and cerebral infarction without residual deficits: Secondary | ICD-10-CM | POA: Diagnosis not present

## 2021-09-12 DIAGNOSIS — I480 Paroxysmal atrial fibrillation: Secondary | ICD-10-CM | POA: Diagnosis present

## 2021-09-12 DIAGNOSIS — S065XAA Traumatic subdural hemorrhage with loss of consciousness status unknown, initial encounter: Secondary | ICD-10-CM | POA: Diagnosis present

## 2021-09-12 DIAGNOSIS — Z9181 History of falling: Secondary | ICD-10-CM | POA: Diagnosis not present

## 2021-09-12 DIAGNOSIS — R58 Hemorrhage, not elsewhere classified: Secondary | ICD-10-CM | POA: Diagnosis not present

## 2021-09-12 DIAGNOSIS — R339 Retention of urine, unspecified: Secondary | ICD-10-CM | POA: Diagnosis not present

## 2021-09-12 DIAGNOSIS — I6603 Occlusion and stenosis of bilateral middle cerebral arteries: Secondary | ICD-10-CM | POA: Diagnosis not present

## 2021-09-12 DIAGNOSIS — E119 Type 2 diabetes mellitus without complications: Secondary | ICD-10-CM | POA: Diagnosis not present

## 2021-09-12 DIAGNOSIS — Z96643 Presence of artificial hip joint, bilateral: Secondary | ICD-10-CM | POA: Diagnosis present

## 2021-09-12 DIAGNOSIS — E1129 Type 2 diabetes mellitus with other diabetic kidney complication: Secondary | ICD-10-CM | POA: Diagnosis present

## 2021-09-12 DIAGNOSIS — R296 Repeated falls: Secondary | ICD-10-CM | POA: Diagnosis present

## 2021-09-12 DIAGNOSIS — B964 Proteus (mirabilis) (morganii) as the cause of diseases classified elsewhere: Secondary | ICD-10-CM | POA: Diagnosis present

## 2021-09-12 DIAGNOSIS — G4089 Other seizures: Secondary | ICD-10-CM | POA: Diagnosis present

## 2021-09-12 DIAGNOSIS — E039 Hypothyroidism, unspecified: Secondary | ICD-10-CM | POA: Diagnosis present

## 2021-09-12 DIAGNOSIS — R627 Adult failure to thrive: Secondary | ICD-10-CM | POA: Diagnosis not present

## 2021-09-12 DIAGNOSIS — R300 Dysuria: Secondary | ICD-10-CM | POA: Diagnosis not present

## 2021-09-12 DIAGNOSIS — N1832 Chronic kidney disease, stage 3b: Secondary | ICD-10-CM | POA: Diagnosis present

## 2021-09-12 DIAGNOSIS — G9341 Metabolic encephalopathy: Secondary | ICD-10-CM | POA: Diagnosis present

## 2021-09-12 DIAGNOSIS — G8929 Other chronic pain: Secondary | ICD-10-CM | POA: Diagnosis not present

## 2021-09-12 DIAGNOSIS — Z711 Person with feared health complaint in whom no diagnosis is made: Secondary | ICD-10-CM | POA: Diagnosis not present

## 2021-09-12 DIAGNOSIS — Z6822 Body mass index (BMI) 22.0-22.9, adult: Secondary | ICD-10-CM

## 2021-09-12 DIAGNOSIS — I129 Hypertensive chronic kidney disease with stage 1 through stage 4 chronic kidney disease, or unspecified chronic kidney disease: Secondary | ICD-10-CM | POA: Diagnosis present

## 2021-09-12 DIAGNOSIS — Z515 Encounter for palliative care: Secondary | ICD-10-CM | POA: Diagnosis not present

## 2021-09-12 DIAGNOSIS — S065X9D Traumatic subdural hemorrhage with loss of consciousness of unspecified duration, subsequent encounter: Secondary | ICD-10-CM | POA: Diagnosis not present

## 2021-09-12 DIAGNOSIS — R29810 Facial weakness: Secondary | ICD-10-CM | POA: Diagnosis not present

## 2021-09-12 DIAGNOSIS — Z960 Presence of urogenital implants: Secondary | ICD-10-CM | POA: Diagnosis not present

## 2021-09-12 DIAGNOSIS — R569 Unspecified convulsions: Secondary | ICD-10-CM

## 2021-09-12 DIAGNOSIS — Z87891 Personal history of nicotine dependence: Secondary | ICD-10-CM

## 2021-09-12 DIAGNOSIS — Z789 Other specified health status: Secondary | ICD-10-CM | POA: Diagnosis not present

## 2021-09-12 DIAGNOSIS — I63412 Cerebral infarction due to embolism of left middle cerebral artery: Secondary | ICD-10-CM | POA: Diagnosis not present

## 2021-09-12 DIAGNOSIS — I63231 Cerebral infarction due to unspecified occlusion or stenosis of right carotid arteries: Secondary | ICD-10-CM | POA: Diagnosis not present

## 2021-09-12 DIAGNOSIS — G319 Degenerative disease of nervous system, unspecified: Secondary | ICD-10-CM | POA: Diagnosis not present

## 2021-09-12 DIAGNOSIS — I10 Essential (primary) hypertension: Secondary | ICD-10-CM | POA: Diagnosis not present

## 2021-09-12 DIAGNOSIS — I6523 Occlusion and stenosis of bilateral carotid arteries: Secondary | ICD-10-CM | POA: Diagnosis not present

## 2021-09-12 DIAGNOSIS — E1122 Type 2 diabetes mellitus with diabetic chronic kidney disease: Secondary | ICD-10-CM | POA: Diagnosis not present

## 2021-09-12 DIAGNOSIS — I63331 Cerebral infarction due to thrombosis of right posterior cerebral artery: Secondary | ICD-10-CM | POA: Diagnosis not present

## 2021-09-12 DIAGNOSIS — I69318 Other symptoms and signs involving cognitive functions following cerebral infarction: Secondary | ICD-10-CM | POA: Diagnosis not present

## 2021-09-12 DIAGNOSIS — Z7401 Bed confinement status: Secondary | ICD-10-CM | POA: Diagnosis not present

## 2021-09-12 DIAGNOSIS — M1990 Unspecified osteoarthritis, unspecified site: Secondary | ICD-10-CM | POA: Diagnosis not present

## 2021-09-12 DIAGNOSIS — L408 Other psoriasis: Secondary | ICD-10-CM | POA: Diagnosis present

## 2021-09-12 DIAGNOSIS — N189 Chronic kidney disease, unspecified: Secondary | ICD-10-CM | POA: Diagnosis not present

## 2021-09-12 DIAGNOSIS — R5381 Other malaise: Secondary | ICD-10-CM | POA: Diagnosis not present

## 2021-09-12 DIAGNOSIS — R4781 Slurred speech: Secondary | ICD-10-CM | POA: Diagnosis not present

## 2021-09-12 DIAGNOSIS — R531 Weakness: Secondary | ICD-10-CM | POA: Diagnosis not present

## 2021-09-12 DIAGNOSIS — I6200 Nontraumatic subdural hemorrhage, unspecified: Secondary | ICD-10-CM | POA: Diagnosis not present

## 2021-09-12 DIAGNOSIS — I639 Cerebral infarction, unspecified: Secondary | ICD-10-CM | POA: Diagnosis present

## 2021-09-12 DIAGNOSIS — M2550 Pain in unspecified joint: Secondary | ICD-10-CM | POA: Diagnosis not present

## 2021-09-12 DIAGNOSIS — N390 Urinary tract infection, site not specified: Secondary | ICD-10-CM | POA: Diagnosis present

## 2021-09-12 DIAGNOSIS — R0682 Tachypnea, not elsewhere classified: Secondary | ICD-10-CM | POA: Diagnosis not present

## 2021-09-12 DIAGNOSIS — R0902 Hypoxemia: Secondary | ICD-10-CM | POA: Diagnosis not present

## 2021-09-12 DIAGNOSIS — Z66 Do not resuscitate: Secondary | ICD-10-CM | POA: Diagnosis present

## 2021-09-12 LAB — ETHANOL: Alcohol, Ethyl (B): <10 mg/dL (ref <10)

## 2021-09-12 LAB — RAPID URINE DRUG SCREEN, HOSP PERFORMED
Amphetamines: NOT DETECTED
Barbiturates: NOT DETECTED
Benzodiazepines: NOT DETECTED
Cocaine: NOT DETECTED
Opiates: NOT DETECTED
Tetrahydrocannabinol: NOT DETECTED

## 2021-09-12 LAB — URINALYSIS, MICROSCOPIC (REFLEX)

## 2021-09-12 LAB — COMPREHENSIVE METABOLIC PANEL
ALT: 27 U/L (ref 0–44)
AST: 30 U/L (ref 15–41)
Albumin: 3 g/dL — ABNORMAL LOW (ref 3.5–5.0)
Alkaline Phosphatase: 71 U/L (ref 38–126)
Anion gap: 12 (ref 5–15)
BUN: 35 mg/dL — ABNORMAL HIGH (ref 8–23)
CO2: 26 mmol/L (ref 22–32)
Calcium: 8.9 mg/dL (ref 8.9–10.3)
Chloride: 94 mmol/L — ABNORMAL LOW (ref 98–111)
Creatinine, Ser: 2.06 mg/dL — ABNORMAL HIGH (ref 0.61–1.24)
GFR, Estimated: 30 mL/min — ABNORMAL LOW (ref >60)
Glucose, Bld: 130 mg/dL — ABNORMAL HIGH (ref 70–99)
Potassium: 3.7 mmol/L (ref 3.5–5.1)
Sodium: 132 mmol/L — ABNORMAL LOW (ref 135–145)
Total Bilirubin: 1 mg/dL (ref 0.3–1.2)
Total Protein: 6.4 g/dL — ABNORMAL LOW (ref 6.5–8.1)

## 2021-09-12 LAB — CBC
HCT: 48 % (ref 39.0–52.0)
Hemoglobin: 16 g/dL (ref 13.0–17.0)
MCH: 31.3 pg (ref 26.0–34.0)
MCHC: 33.3 g/dL (ref 30.0–36.0)
MCV: 93.8 fL (ref 80.0–100.0)
Platelets: 132 10*3/uL — ABNORMAL LOW (ref 150–400)
RBC: 5.12 MIL/uL (ref 4.22–5.81)
RDW: 12.9 % (ref 11.5–15.5)
WBC: 14.6 10*3/uL — ABNORMAL HIGH (ref 4.0–10.5)
nRBC: 0 % (ref 0.0–0.2)

## 2021-09-12 LAB — DIFFERENTIAL
Abs Immature Granulocytes: 0.08 10*3/uL — ABNORMAL HIGH (ref 0.00–0.07)
Basophils Absolute: 0 10*3/uL (ref 0.0–0.1)
Basophils Relative: 0 %
Eosinophils Absolute: 0.2 10*3/uL (ref 0.0–0.5)
Eosinophils Relative: 1 %
Immature Granulocytes: 1 %
Lymphocytes Relative: 8 %
Lymphs Abs: 1.2 10*3/uL (ref 0.7–4.0)
Monocytes Absolute: 1.3 10*3/uL — ABNORMAL HIGH (ref 0.1–1.0)
Monocytes Relative: 9 %
Neutro Abs: 11.8 10*3/uL — ABNORMAL HIGH (ref 1.7–7.7)
Neutrophils Relative %: 81 %

## 2021-09-12 LAB — GLUCOSE, CAPILLARY: Glucose-Capillary: 124 mg/dL — ABNORMAL HIGH (ref 70–99)

## 2021-09-12 LAB — I-STAT CHEM 8, ED
BUN: 35 mg/dL — ABNORMAL HIGH (ref 8–23)
Calcium, Ion: 1.06 mmol/L — ABNORMAL LOW (ref 1.15–1.40)
Chloride: 99 mmol/L (ref 98–111)
Creatinine, Ser: 2 mg/dL — ABNORMAL HIGH (ref 0.61–1.24)
Glucose, Bld: 128 mg/dL — ABNORMAL HIGH (ref 70–99)
HCT: 49 % (ref 39.0–52.0)
Hemoglobin: 16.7 g/dL (ref 13.0–17.0)
Potassium: 3.8 mmol/L (ref 3.5–5.1)
Sodium: 136 mmol/L (ref 135–145)
TCO2: 29 mmol/L (ref 22–32)

## 2021-09-12 LAB — PROTIME-INR
INR: 1.2 (ref 0.8–1.2)
Prothrombin Time: 15 seconds (ref 11.4–15.2)

## 2021-09-12 LAB — AMMONIA: Ammonia: 22 umol/L (ref 9–35)

## 2021-09-12 LAB — RESP PANEL BY RT-PCR (FLU A&B, COVID) ARPGX2
Influenza A by PCR: NEGATIVE
Influenza B by PCR: NEGATIVE
SARS Coronavirus 2 by RT PCR: NEGATIVE

## 2021-09-12 LAB — CBG MONITORING, ED
Glucose-Capillary: 134 mg/dL — ABNORMAL HIGH (ref 70–99)
Glucose-Capillary: 119 mg/dL — ABNORMAL HIGH (ref 70–99)

## 2021-09-12 LAB — TSH: TSH: 1.102 u[IU]/mL (ref 0.350–4.500)

## 2021-09-12 LAB — URINALYSIS, ROUTINE W REFLEX MICROSCOPIC
Bilirubin Urine: NEGATIVE
Glucose, UA: NEGATIVE mg/dL
Ketones, ur: NEGATIVE mg/dL
Nitrite: POSITIVE — AB
Protein, ur: 100 mg/dL — AB
Specific Gravity, Urine: <1.005 — ABNORMAL LOW (ref 1.005–1.030)
pH: 7.5 (ref 5.0–8.0)

## 2021-09-12 LAB — VALPROIC ACID LEVEL: Valproic Acid Lvl: 57 ug/mL (ref 50.0–100.0)

## 2021-09-12 LAB — APTT: aPTT: 30 seconds (ref 24–36)

## 2021-09-12 LAB — VITAMIN B12: Vitamin B-12: 548 pg/mL (ref 180–914)

## 2021-09-12 IMAGING — CT CT HEAD CODE STROKE
3 series · 15 of 47 positions shown, 18 images · non-contrast
Comparison: [DATE]

CLINICAL DATA: Code stroke.



[Series 2: head 5.0 st · axial · 0.48mm/px · z∈[+1466,+1606]mm · 9 of 34 slices shown, 12 images]
[im 3/34  brain]
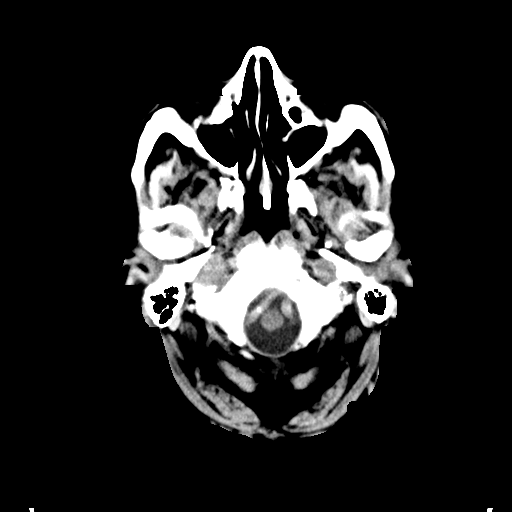
[im 3/34  bone]
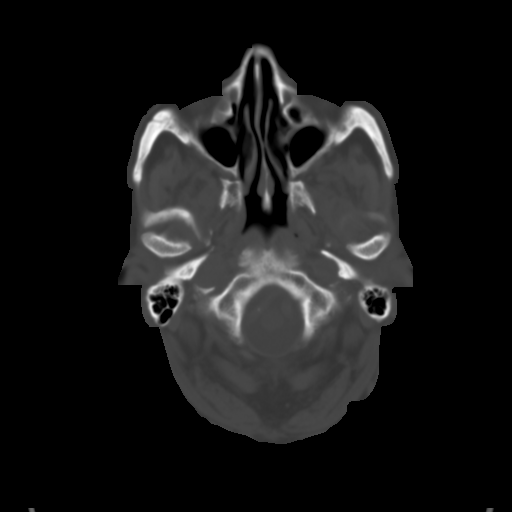
[im 6/34  brain]
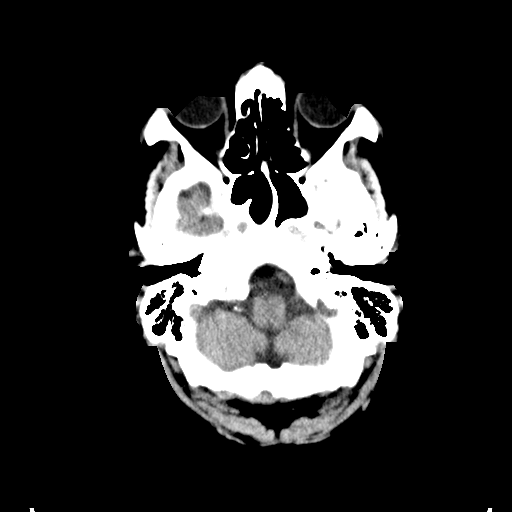
[im 10/34  brain]
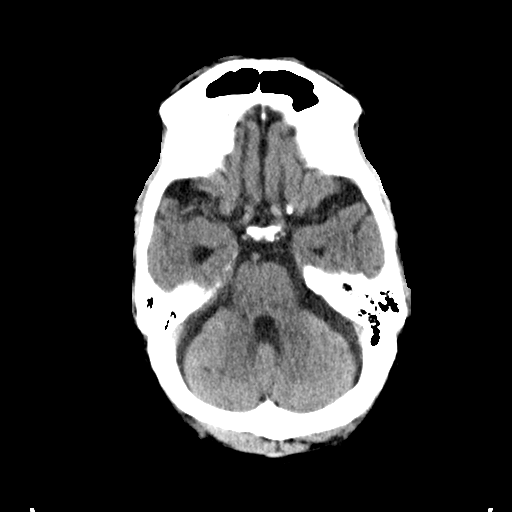
[im 13/34  brain]
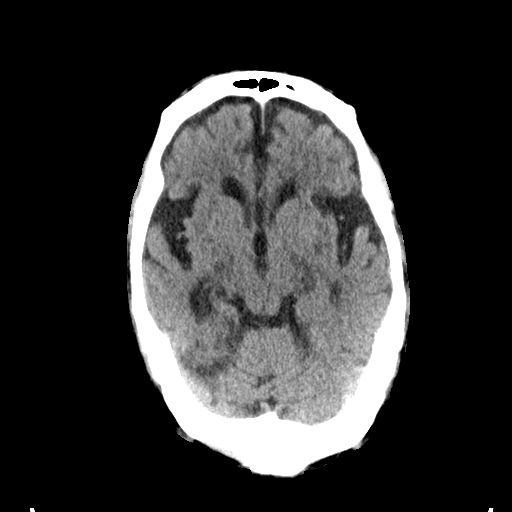
[im 18/34  brain]
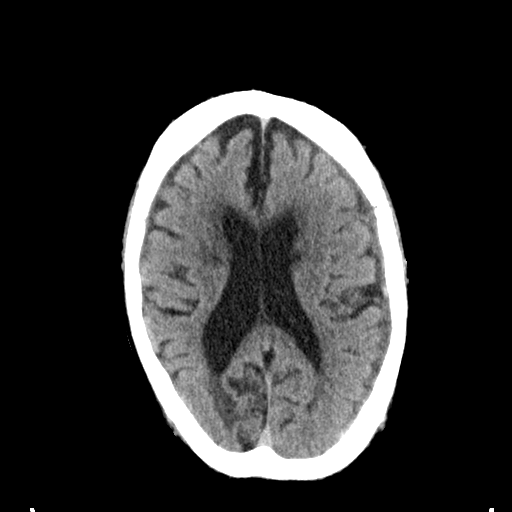
[im 18/34  bone]
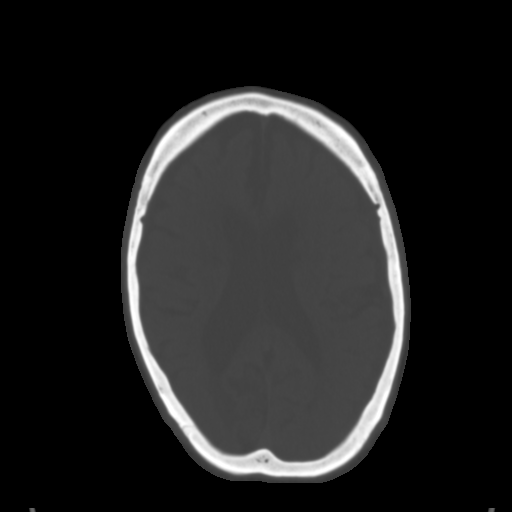
[im 21/34  brain]
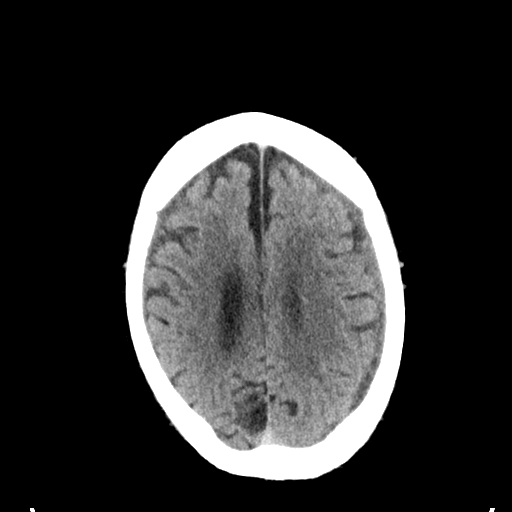
[im 24/34  brain]
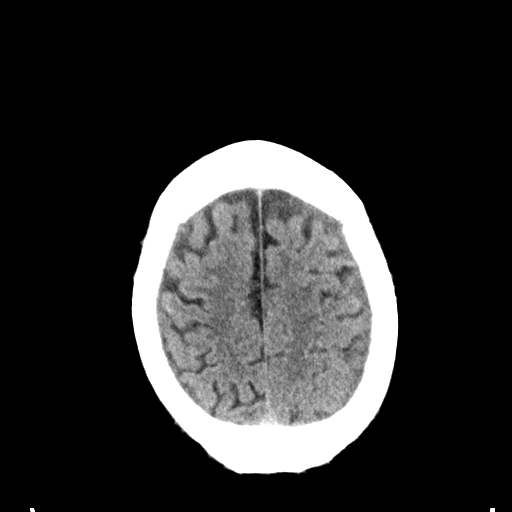
[im 28/34  brain]
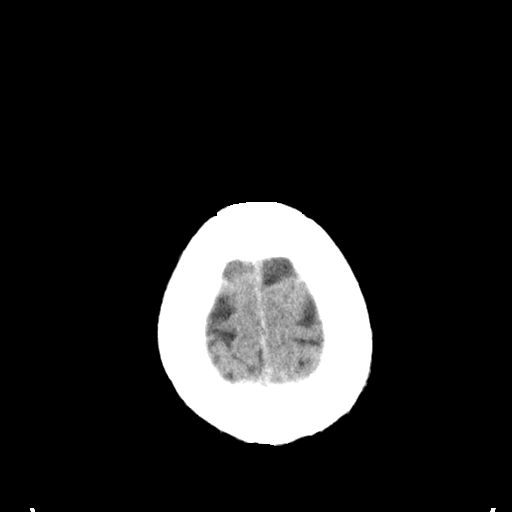
[im 31/34  brain]
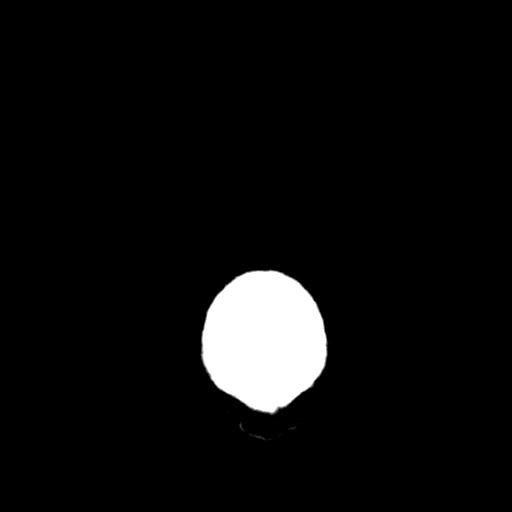
[im 31/34  bone]
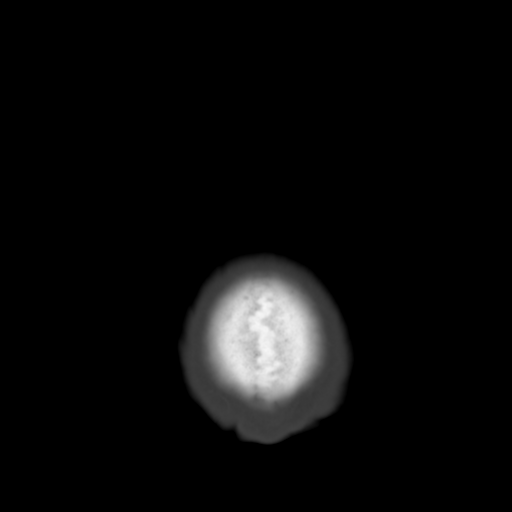

[Series 4: head 3.0 cor st · coronal · 0.33mm/px · 3 of 70 slices shown]
[im 24/70  brain]
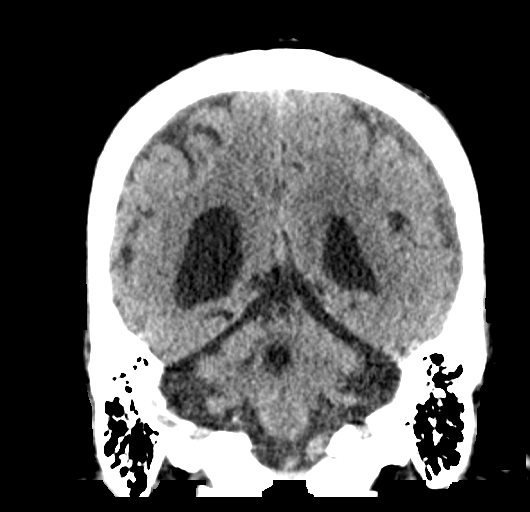
[im 31/70  brain]
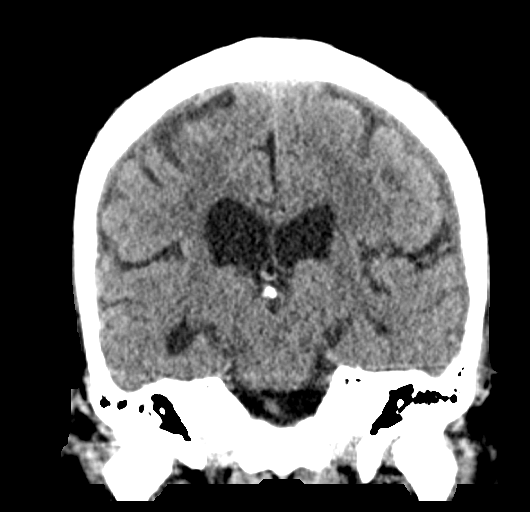
[im 39/70  brain]
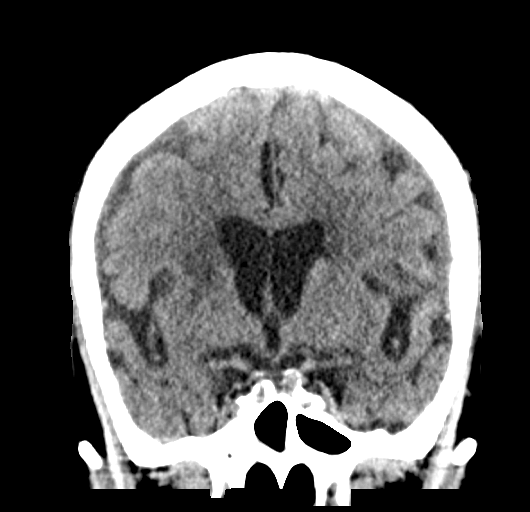

[Series 5: head 3.0 sag st · sagittal · 0.33mm/px · 3 of 48 slices shown]
[im 16/48  brain]
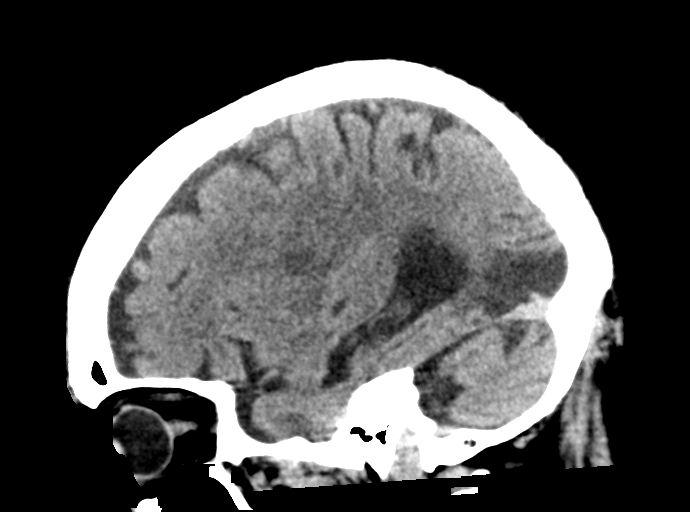
[im 24/48  brain]
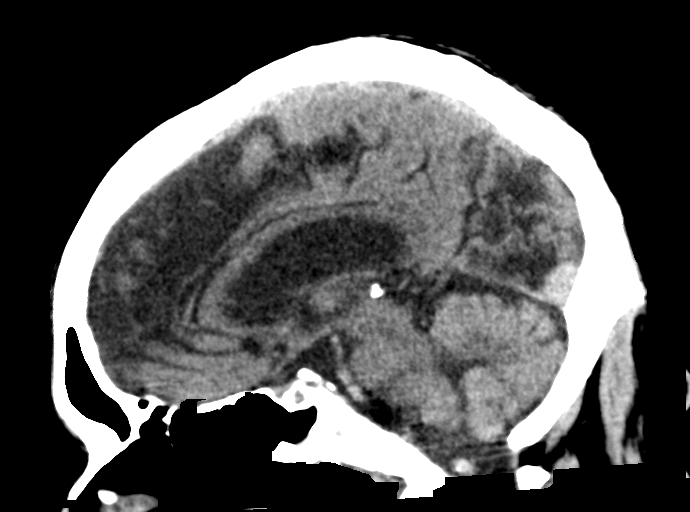
[im 32/48  brain]
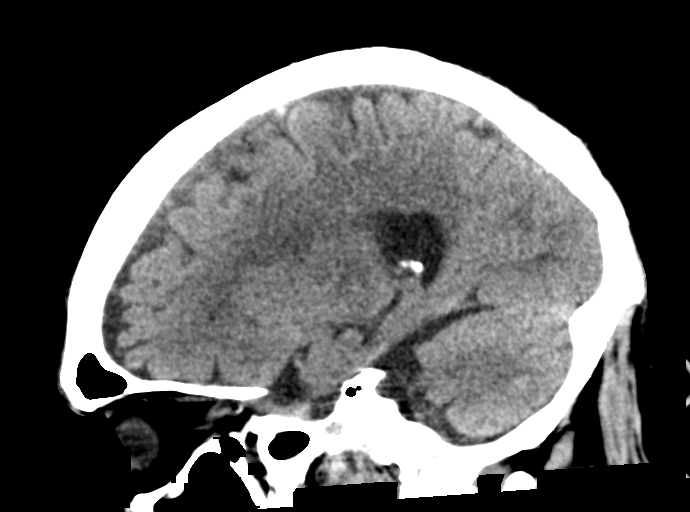

[15 of 47 positions shown; findings below may reference images not displayed]

FINDINGS: Brain: Hypodense to isodense left subdural hematoma is again
identified and is likely not substantially changed in size. There
are decreased dependent hyperdense blood products. No new
hemorrhage. There is increased ill-defined hypodensity in the region
of the right corona radiata and basal ganglia. Chronic right
occipitoparietal infarct. Chronic right cerebellar infarct. Chronic
infarcts of the left corona radiata and basal ganglia.

Vascular: No hyperdense vessel.

Skull: Unremarkable.

Sinuses/Orbits: Mild mucosal thickening. Evidence of chronic sinus
inflammation. Left lens replacement.

Other: Mastoid air cells are clear.

ASPECTS (Alberta Stroke Program Early CT Score)

- Ganglionic level infarction (caudate, lentiform nuclei, internal
capsule, insula, M1-M3 cortex): 5

- Supraganglionic infarction (M4-M6 cortex): 3

Total score (0-10 with 10 being normal): 8
IMPRESSION: There is no acute intracranial hemorrhage. Increased ill-defined
low-density in the region of the right corona radiata and basal
ganglia may reflect recent small vessel infarcts. ASPECT score is 8.

Mixed density left subdural hematoma likely similar in size.
Decreased hyperdense hemorrhage.

## 2021-09-12 IMAGING — DX DG CHEST 1V PORT
1 series · 1 of 1 positions shown · non-contrast
Comparison: [DATE].

CLINICAL DATA: Altered mental status.

EXAM:
PORTABLE CHEST 1 VIEW

[chest]
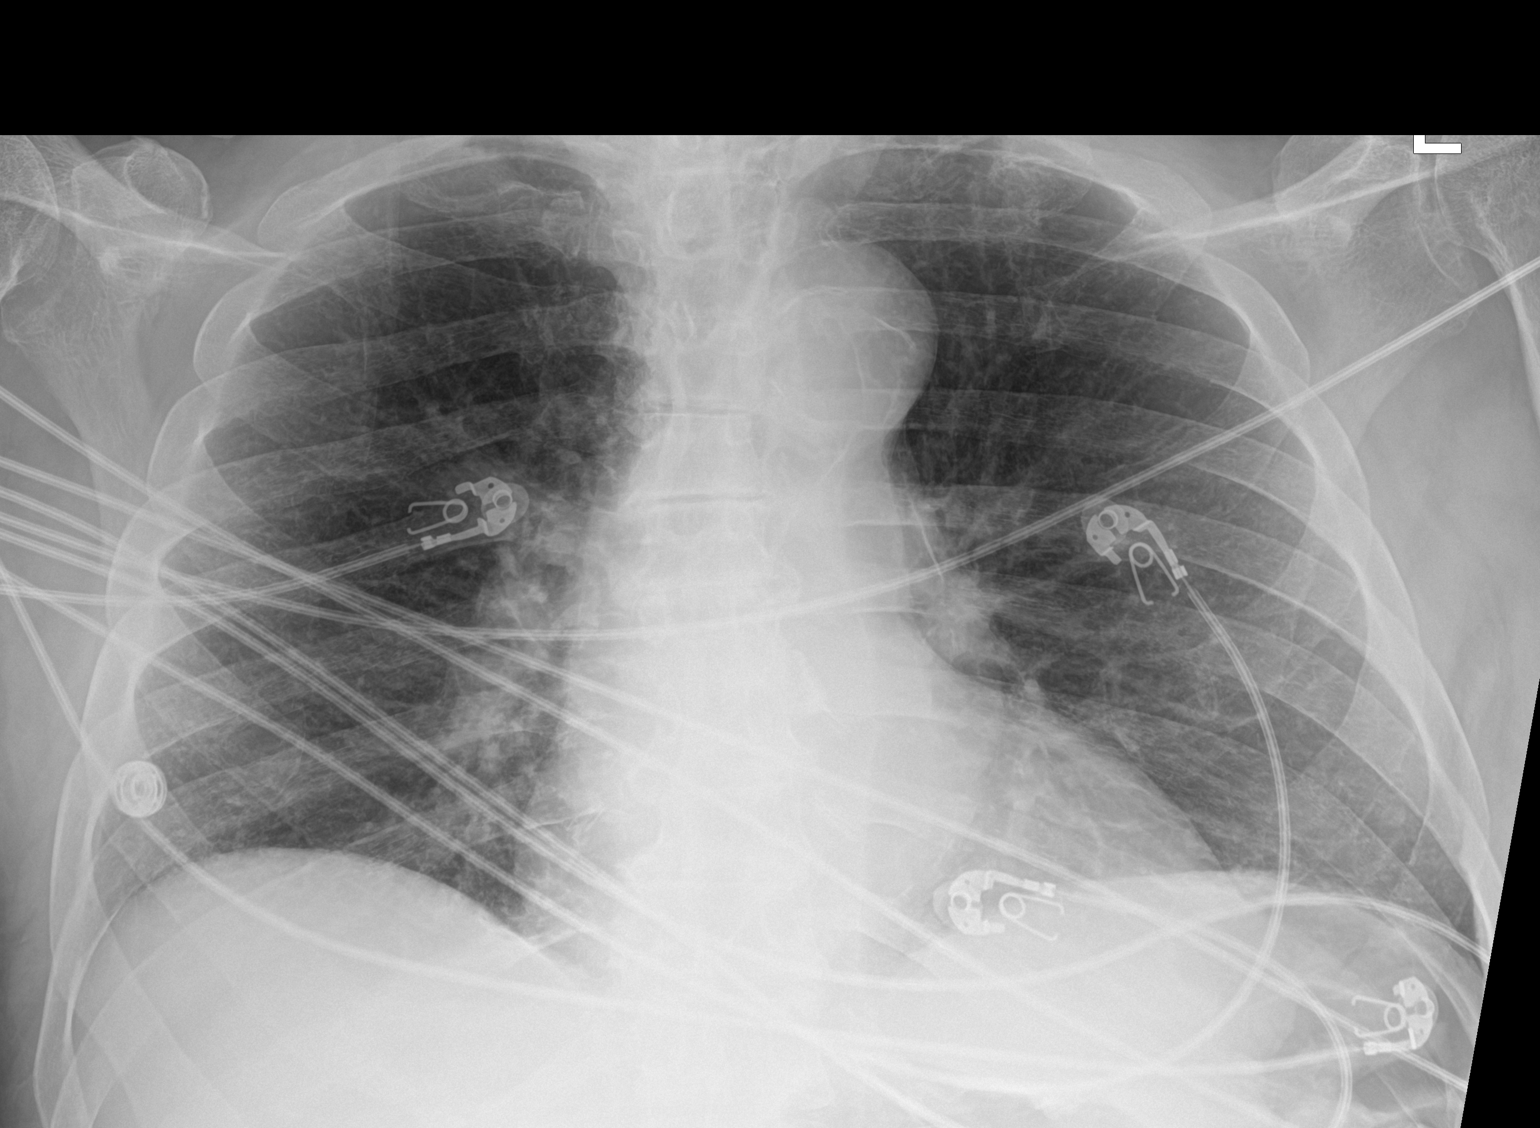

[1 of 1 positions shown; findings below may reference images not displayed]

FINDINGS: The heart size and mediastinal contours are within normal limits.
Both lungs are clear. The visualized skeletal structures are
unremarkable.
IMPRESSION: No active disease.

## 2021-09-12 IMAGING — MR MR HEAD W/O CM
8 of 10 series · 35 of 48 positions shown · non-contrast
Comparison: Prior CTs from earlier the same day.

CLINICAL DATA: Initial evaluation for neuro deficit, stroke.

EXAM:
MRI HEAD WITHOUT CONTRAST
TECHNIQUE: Multiplanar, multiecho pulse sequences of the brain and surrounding
structures were obtained without intravenous contrast.

[Series 3: DWI · axial · 3.0mm · 1.09mm/px · z∈[-86,+67]mm · 9 of 104 slices shown (1 of 4)]
[im 1/104]
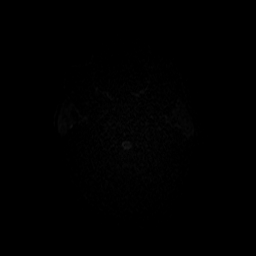
[im 13/104]
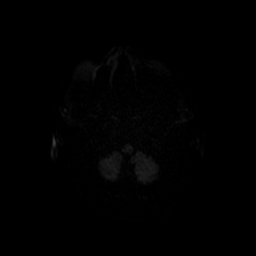
[im 26/104]
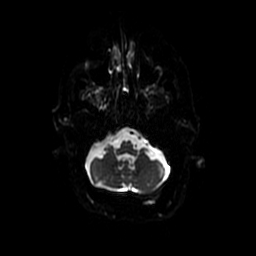
[im 39/104]
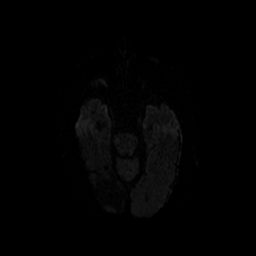
[im 52/104]
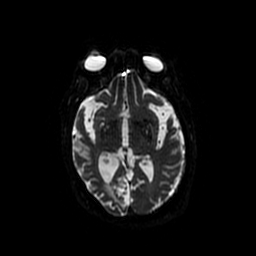
[im 65/104]
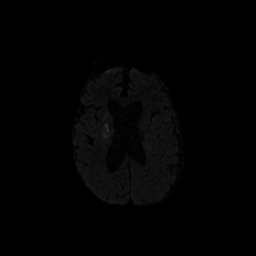
[im 78/104]
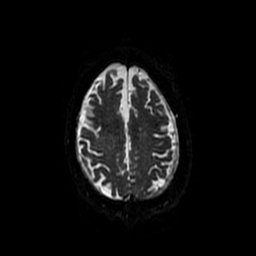
[im 91/104]
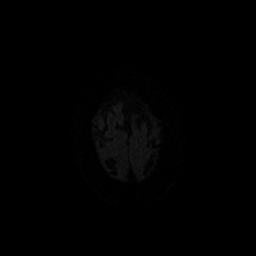
[im 104/104]
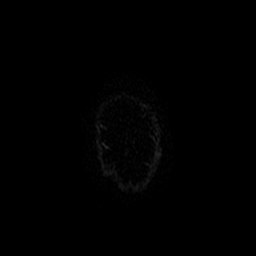

[Series 4: DWI · coronal · 5.0mm · 1.09mm/px · 7 of 74 slices shown (2 of 4)]
[im 1/74]
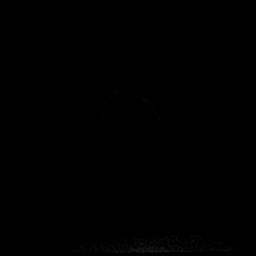
[im 13/74]
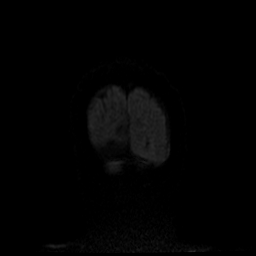
[im 25/74]
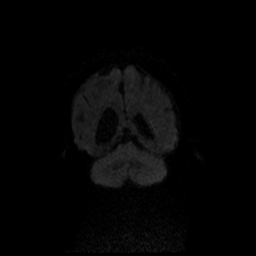
[im 37/74]
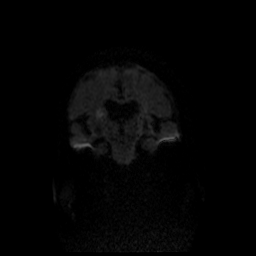
[im 49/74]
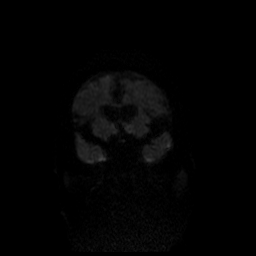
[im 61/74]
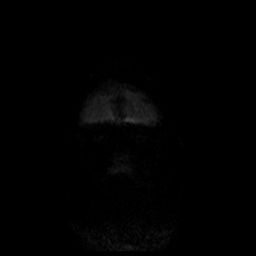
[im 74/74]
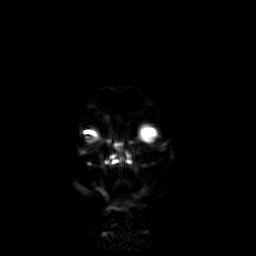

[Series 5: T1 · sagittal · 5.0mm · 0.47mm/px · 1 of 24 slices shown]
[im 1/24]
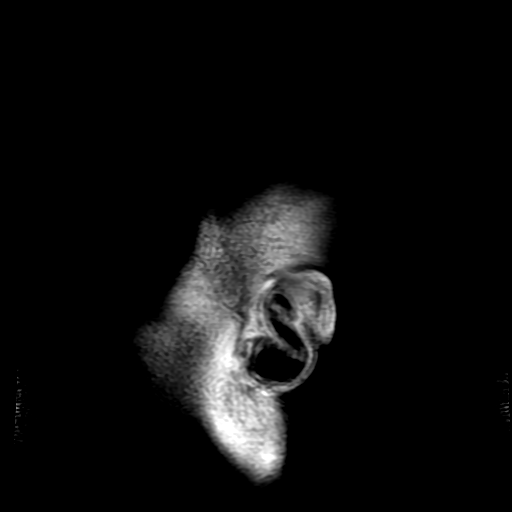

[Series 6: T2 · axial · 5.0mm · 0.45mm/px · z∈[-91,+65]mm · 3 of 27 slices shown (1 of 2)]
[im 1/27]
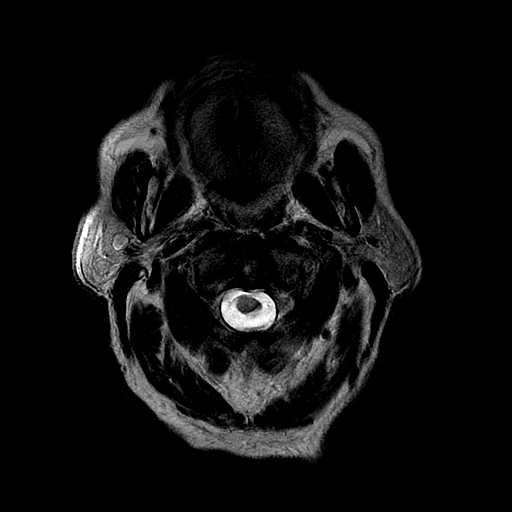
[im 14/27]
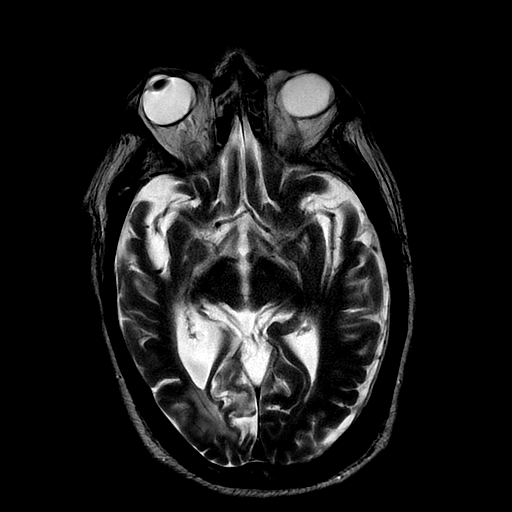
[im 27/27]
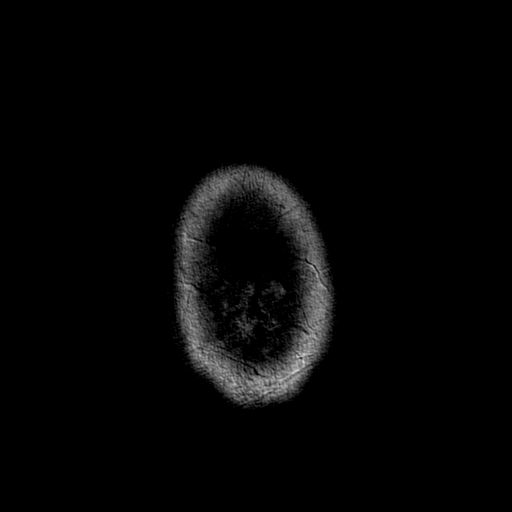

[Series 7: FLAIR · axial · 3.0mm · 0.45mm/px · z∈[-91,+65]mm · 3 of 27 slices shown]
[im 1/27]
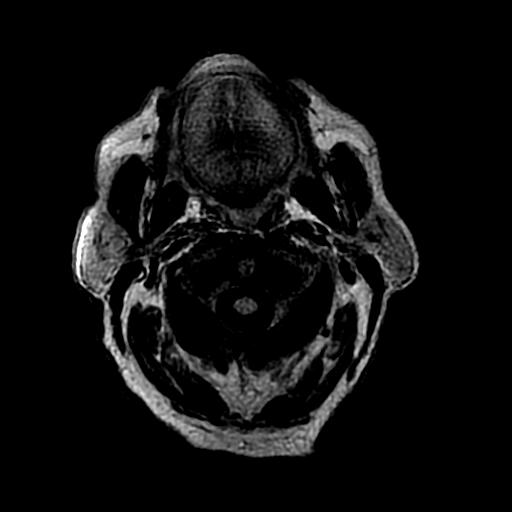
[im 14/27]
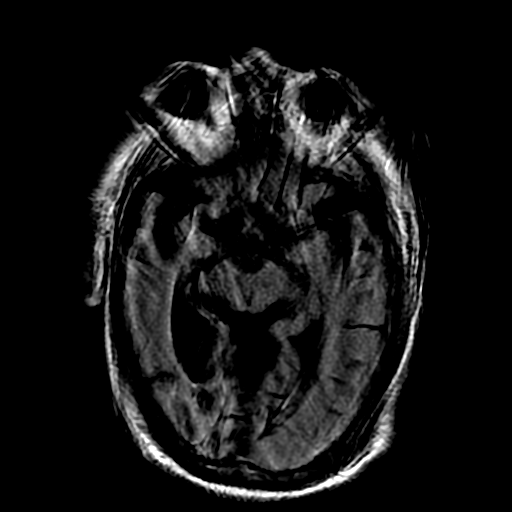
[im 27/27]
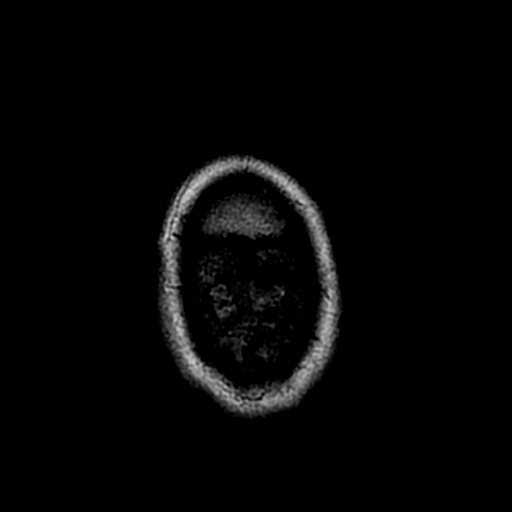

[Series 10: T2 · coronal · 5.0mm · 0.39mm/px · 3 of 29 slices shown (2 of 2)]
[im 1/29]
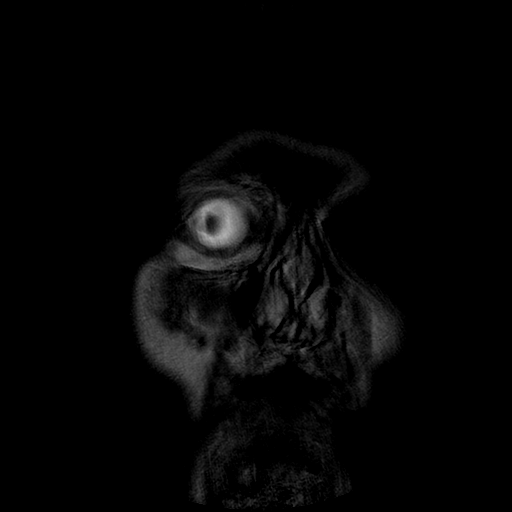
[im 15/29]
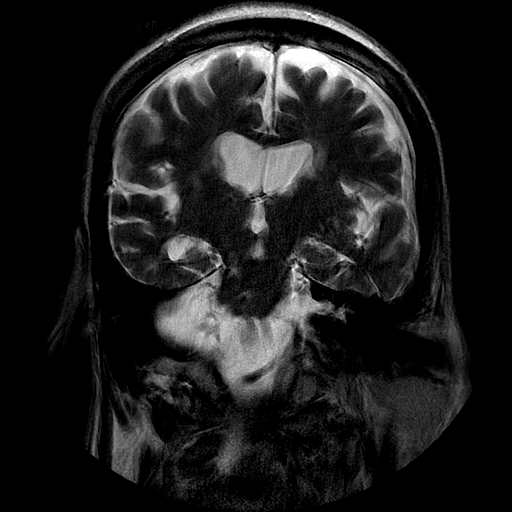
[im 29/29]
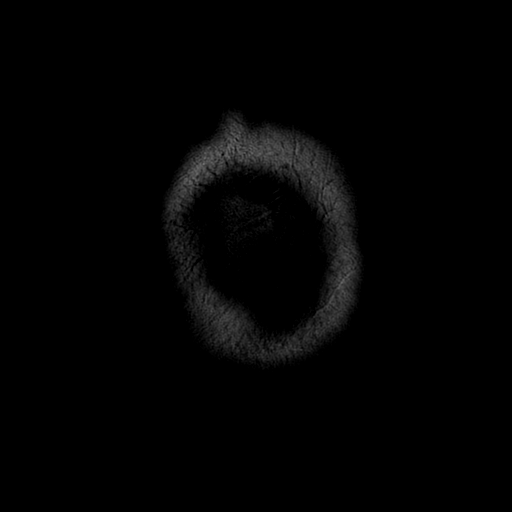

[Series 300: DWI · axial · 3.0mm · 1.09mm/px · z∈[-86,+67]mm · 5 of 52 slices shown (3 of 4)]
[im 1/52]
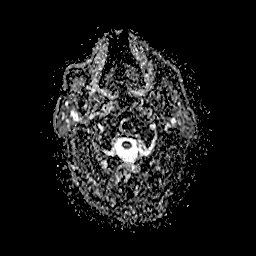
[im 13/52]
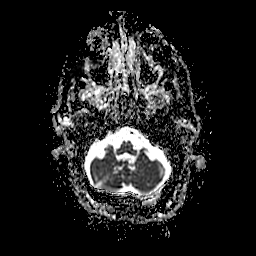
[im 26/52]
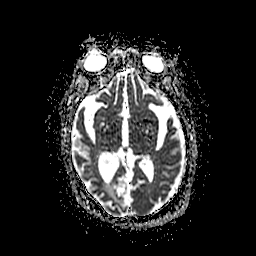
[im 39/52]
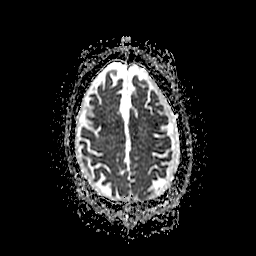
[im 52/52]
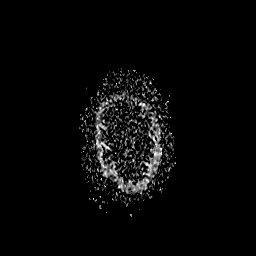

[Series 400: DWI · coronal · 5.0mm · 1.09mm/px · 4 of 37 slices shown (4 of 4)]
[im 1/37]
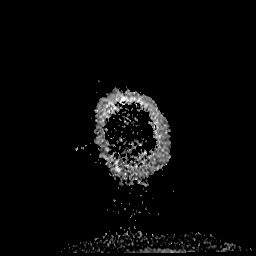
[im 13/37]
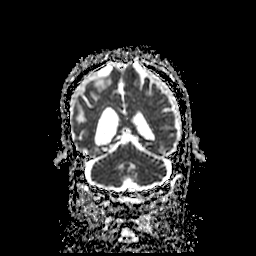
[im 25/37]
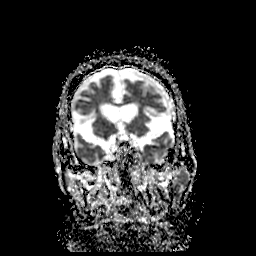
[im 37/37]
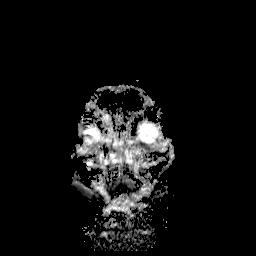

[35 of 48 positions shown; findings below may reference images not displayed]

FINDINGS: Brain: Examination moderately to severely degraded by motion
artifact.

Diffuse prominence of the CSF containing spaces compatible
generalized cerebral atrophy. Mild chronic microvascular ischemic
disease noted involving the periventricular white matter. Few
scattered remote lacunar infarcts noted about the bilateral basal
ganglia/corona radiata. Remote lacunar infarct present at the right
pons. Encephalomalacia and gliosis involving the right occipital
lobe compatible with a chronic right PCA distribution infarct. Few
additional scattered bilateral cerebellar infarcts noted, right
greater than left.

Patchy diffusion signal abnormality involving the right corona
radiata measures approximately 2 cm in size, consistent with an
acute to early subacute ischemic infarct (series 3, image 33). No
associated hemorrhage or mass effect. No other evidence for acute or
subacute ischemia. Gray-white matter differentiation otherwise
maintained.

No mass lesion. Ventricles normal size without hydrocephalus. Known
subacute left subdural hematoma again seen, grossly stable measuring
up to approximately 5-6 mm without significant mass effect or
midline shift. Basilar cisterns remain patent.

Vascular: Major intracranial vascular flow voids are maintained.

Skull and upper cervical spine: Craniocervical junction grossly
within normal limits on this motion degraded exam. Bone marrow
signal intensity grossly normal. No scalp soft tissue abnormality.

Sinuses/Orbits: Prior ocular lens replacement on the left. Right
gaze noted. Chronic mucosal thickening noted throughout the
ethmoidal air cells and maxillary sinuses. No significant mastoid
effusion.

Other: None.
IMPRESSION: 1. Motion degraded exam.
2. 2 cm acute to early subacute ischemic nonhemorrhagic infarct
involving the right corona radiata.
3. Chronic right PCA distribution infarct, with additional scattered
remote lacunar infarcts involving the bilateral basal ganglia/corona
radiata, right pons, and bilateral cerebellar hemispheres.
4. Stable subacute left subdural hematoma measuring up to 5-6 mm
without significant mass effect or midline shift.

## 2021-09-12 IMAGING — CT CT ANGIO HEAD-NECK (W OR W/O PERF)
2 of 7 series · 8 of 33 positions shown · IV contrast (OMNI 350)
Comparison: None.
COMPARISON: None.

Addendum:
CLINICAL DATA: Code stroke.

EXAM:
CT ANGIOGRAPHY HEAD AND NECK
TECHNIQUE: Multidetector CT imaging of the head and neck was performed using
the standard protocol during bolus administration of intravenous
contrast. Multiplanar CT image reconstructions and MIPs were
obtained to evaluate the vascular anatomy. Carotid stenosis
measurements (when applicable) are obtained utilizing NASCET
criteria, using the distal internal carotid diameter as the
denominator.

[Series 5: cta neck · axial · 0.47mm/px · z∈[+1359,+1473]mm · 2 of 172 slices shown]
[im 58/172  soft-tissue]
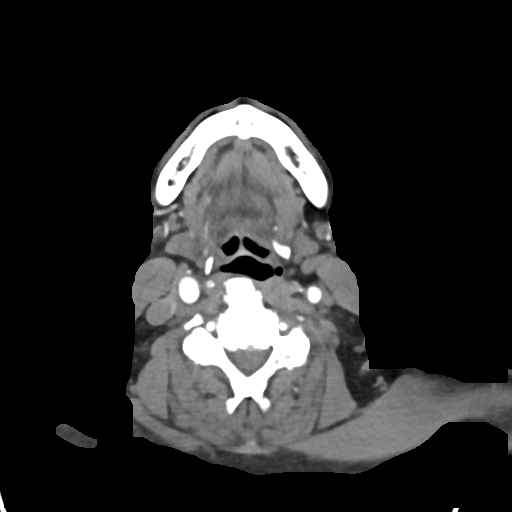
[im 115/172  soft-tissue]
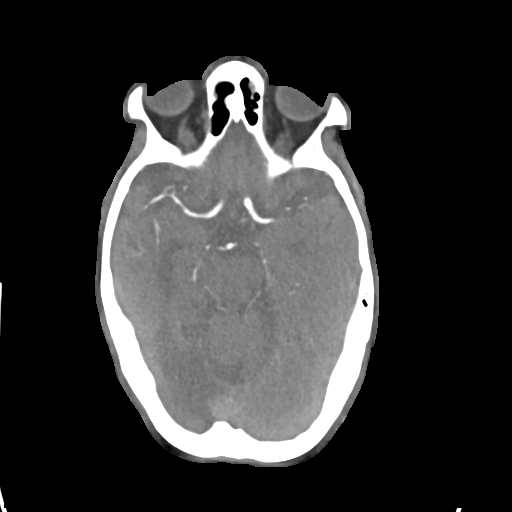

[Series 7: cta neck axial · axial · 0.39mm/px · z∈[+1285,+1529]mm · 6 of 343 slices shown]
[im 49/343  soft-tissue]
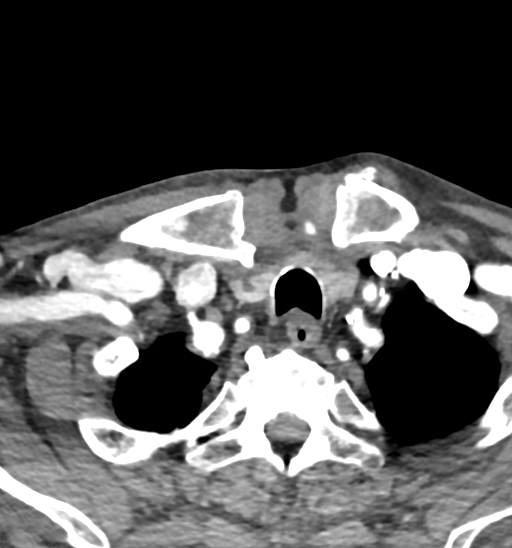
[im 98/343  bone]
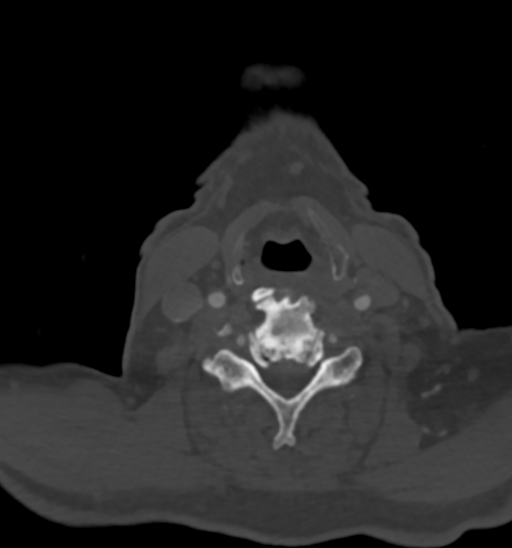
[im 147/343  soft-tissue]
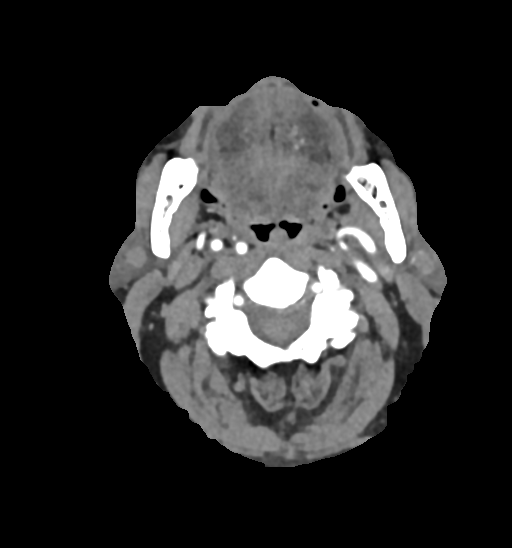
[im 196/343  bone]
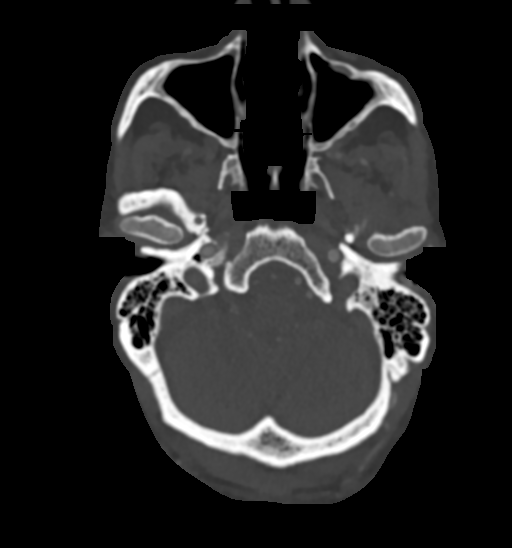
[im 245/343  soft-tissue]
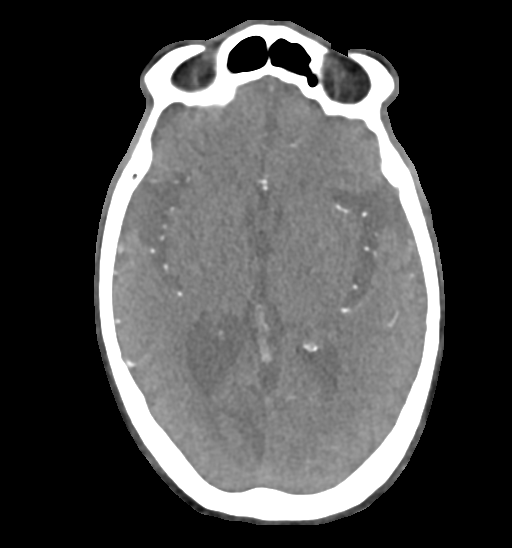
[im 294/343  bone]
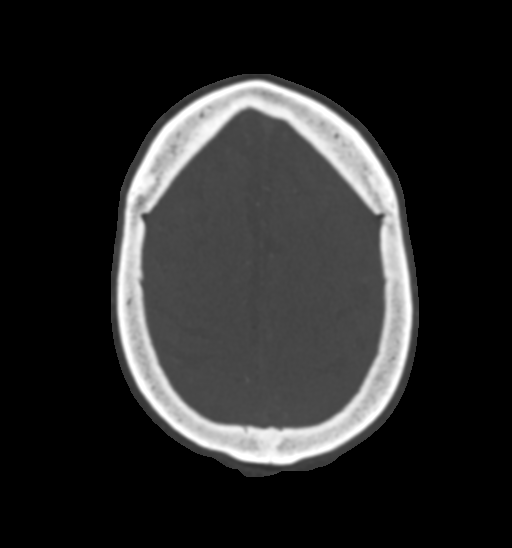

[8 of 33 positions shown; findings below may reference images not displayed]

RADIATION DOSE REDUCTION: This exam was performed according to the
departmental dose-optimization program which includes automated
exposure control, adjustment of the mA and/or kV according to
patient size and/or use of iterative reconstruction technique.

CONTRAST:  100mL OMNIPAQUE IOHEXOL 350 MG/ML SOLN
FINDINGS: CTA NECK FINDINGS

Aortic arch: Extensive and ulcerated atherosclerosis of the
visualized aorta. Right subclavian artery origin is not imaged.

Right carotid system: Atherosclerosis at the carotid bifurcation
without greater than 50% stenosis.

Left carotid system: Approximately 60% stenosis of the left carotid
artery origin. Atherosclerosis at the carotid bifurcation without
greater than% stenosis.

Vertebral arteries: Patent. Moderate stenosis of the left intradural
vertebral artery at C4-C5 due to mass effect from adjacent
osteophytes.

Skeleton:

Other neck:

Upper chest:

Review of the MIP images confirms the above findings

CTA HEAD FINDINGS

Anterior circulation: Bilateral intracranial ICAs are patent with
moderate right and mild left paraclinoid ICA stenosis. Bilateral
MCAs are patent with moderate bilateral M1 MCA stenosis. Multifocal
severe bilateral M2 MCA stenosis. Bilateral ACAs are patent.
Multifocal severe stenosis of the right A2 ACA.

Posterior circulation: Severe stenosis of the left intradural
vertebral artery. Severe stenosis of the distal basilar artery.
Severe stenosis of the left P1 PCA. Severe stenosis versus
short-segment occlusion of the left P2 PCA proximally with opacified
small distal PCA. Severe stenosis of the distal right P2 PCA with
opacified small distal PCA.

Venous sinuses: Nondiagnostic evaluation due to arterial timing

Review of the MIP images confirms the above findings

Dr. BAMYAN paged at the time of dication for call report. Awaiting
call back.
IMPRESSION: CTA head:

1. Severe stenosis versus short-segment occlusion of the left P2
PCA. Additional severe stenoses of the left intradural vertebral
artery distal basilar artery, and right P2 PCA. Distal PCAs are
opacified bilaterally, but small and irregular.
2. Severe stenosis of the right A2 ACA.
3. Multifocal severe bilateral M2 MCA stenosis.
4. Moderate right paraclinoid ICA and bilateral M1 MCA stenosis.

CTA neck:

1. Approximately 60% stenosis of the left carotid artery origin.
2. Moderate stenosis of the left intradural vertebral artery at
C4-C5 due to mass effect from adjacent osteophytes.
3. Moderate stenosis of the left subclavian artery origin. Right
subclavian artery origin is not imaged.
4. Ulcerated aortic atherosclerosis ([XD]-[XD]).

ADDENDUM:
Findings discussed with Dr. BAMYAN via telephone at [DATE]

*** End of Addendum ***
RADIATION DOSE REDUCTION: This exam was performed according to the
departmental dose-optimization program which includes automated
exposure control, adjustment of the mA and/or kV according to
patient size and/or use of iterative reconstruction technique.

CONTRAST:  100mL OMNIPAQUE IOHEXOL 350 MG/ML SOLN
FINDINGS: CTA NECK FINDINGS

Aortic arch: Extensive and ulcerated atherosclerosis of the
visualized aorta. Right subclavian artery origin is not imaged.

Right carotid system: Atherosclerosis at the carotid bifurcation
without greater than 50% stenosis.

Left carotid system: Approximately 60% stenosis of the left carotid
artery origin. Atherosclerosis at the carotid bifurcation without
greater than% stenosis.

Vertebral arteries: Patent. Moderate stenosis of the left intradural
vertebral artery at C4-C5 due to mass effect from adjacent
osteophytes.

Skeleton:

Other neck:

Upper chest:

Review of the MIP images confirms the above findings

CTA HEAD FINDINGS

Anterior circulation: Bilateral intracranial ICAs are patent with
moderate right and mild left paraclinoid ICA stenosis. Bilateral
MCAs are patent with moderate bilateral M1 MCA stenosis. Multifocal
severe bilateral M2 MCA stenosis. Bilateral ACAs are patent.
Multifocal severe stenosis of the right A2 ACA.

Posterior circulation: Severe stenosis of the left intradural
vertebral artery. Severe stenosis of the distal basilar artery.
Severe stenosis of the left P1 PCA. Severe stenosis versus
short-segment occlusion of the left P2 PCA proximally with opacified
small distal PCA. Severe stenosis of the distal right P2 PCA with
opacified small distal PCA.

Venous sinuses: Nondiagnostic evaluation due to arterial timing

Review of the MIP images confirms the above findings

Dr. BAMYAN paged at the time of dication for call report. Awaiting
call back.
IMPRESSION: CTA head:

1. Severe stenosis versus short-segment occlusion of the left P2
PCA. Additional severe stenoses of the left intradural vertebral
artery distal basilar artery, and right P2 PCA. Distal PCAs are
opacified bilaterally, but small and irregular.
2. Severe stenosis of the right A2 ACA.
3. Multifocal severe bilateral M2 MCA stenosis.
4. Moderate right paraclinoid ICA and bilateral M1 MCA stenosis.

CTA neck:

1. Approximately 60% stenosis of the left carotid artery origin.
2. Moderate stenosis of the left intradural vertebral artery at
C4-C5 due to mass effect from adjacent osteophytes.
3. Moderate stenosis of the left subclavian artery origin. Right
subclavian artery origin is not imaged.
4. Ulcerated aortic atherosclerosis ([XD]-[XD]).

## 2021-09-12 MED ORDER — ASPIRIN 81 MG PO CHEW
81.0000 mg | CHEWABLE_TABLET | Freq: Every day | ORAL | Status: DC
  Administered 2021-09-13: 81 mg via ORAL
  Filled 2021-09-12: qty 1

## 2021-09-12 MED ORDER — ASPIRIN 325 MG PO TABS
325.0000 mg | ORAL_TABLET | Freq: Once | ORAL | Status: AC
  Administered 2021-09-12: 325 mg via ORAL
  Filled 2021-09-12: qty 1

## 2021-09-12 MED ORDER — TAMSULOSIN HCL 0.4 MG PO CAPS
0.4000 mg | ORAL_CAPSULE | Freq: Every morning | ORAL | Status: DC
  Administered 2021-09-13 – 2021-09-14 (×2): 0.4 mg via ORAL
  Filled 2021-09-12 (×2): qty 1

## 2021-09-12 MED ORDER — FINASTERIDE 5 MG PO TABS
5.0000 mg | ORAL_TABLET | Freq: Every morning | ORAL | Status: DC
  Administered 2021-09-13 – 2021-09-14 (×2): 5 mg via ORAL
  Filled 2021-09-12 (×2): qty 1

## 2021-09-12 MED ORDER — ACETAMINOPHEN 650 MG RE SUPP: 650.0000 mg | Freq: Four times a day (QID) | RECTAL | Status: DC | PRN

## 2021-09-12 MED ORDER — SODIUM CHLORIDE 0.9 % IV SOLN: INTRAVENOUS | Status: DC

## 2021-09-12 MED ORDER — INSULIN ASPART 100 UNIT/ML IJ SOLN: 0.0000 [IU] | Freq: Three times a day (TID) | INTRAMUSCULAR | Status: DC

## 2021-09-12 MED ORDER — METOPROLOL TARTRATE 50 MG PO TABS: 50.0000 mg | ORAL_TABLET | Freq: Two times a day (BID) | ORAL | Status: DC

## 2021-09-12 MED ORDER — ATORVASTATIN CALCIUM 80 MG PO TABS
80.0000 mg | ORAL_TABLET | Freq: Every morning | ORAL | Status: DC
  Administered 2021-09-13 – 2021-09-14 (×2): 80 mg via ORAL
  Filled 2021-09-12 (×2): qty 1

## 2021-09-12 MED ORDER — DICLOFENAC SODIUM 1 % TD GEL: 4.0000 g | Freq: Four times a day (QID) | TRANSDERMAL | Status: DC | PRN

## 2021-09-12 MED ORDER — SODIUM CHLORIDE 0.9 % IV BOLUS
500.0000 mL | Freq: Once | INTRAVENOUS | Status: AC
Start: 2021-09-12 — End: 2021-09-12
  Administered 2021-09-12: 500 mL via INTRAVENOUS

## 2021-09-12 MED ORDER — DICLOFENAC SODIUM 1 % EX GEL: 4.0000 g | Freq: Four times a day (QID) | CUTANEOUS | Status: DC | PRN

## 2021-09-12 MED ORDER — ACETAMINOPHEN 325 MG PO TABS
650.0000 mg | ORAL_TABLET | Freq: Four times a day (QID) | ORAL | Status: DC | PRN
  Administered 2021-09-13: 650 mg via ORAL
  Filled 2021-09-12: qty 2

## 2021-09-12 MED ORDER — FENOFIBRATE 54 MG PO TABS
54.0000 mg | ORAL_TABLET | Freq: Every day | ORAL | Status: DC
  Administered 2021-09-13 – 2021-09-14 (×2): 54 mg via ORAL
  Filled 2021-09-12 (×2): qty 1

## 2021-09-12 MED ORDER — SODIUM CHLORIDE 0.9 % IV SOLN
1.0000 g | INTRAVENOUS | Status: DC
  Administered 2021-09-12 – 2021-09-13 (×2): 1 g via INTRAVENOUS
  Filled 2021-09-12 (×2): qty 10

## 2021-09-12 MED ORDER — IOHEXOL 350 MG/ML SOLN
100.0000 mL | Freq: Once | INTRAVENOUS | Status: AC | PRN
  Administered 2021-09-12: 100 mL via INTRAVENOUS

## 2021-09-12 MED ORDER — DIVALPROEX SODIUM 250 MG PO DR TAB
500.0000 mg | DELAYED_RELEASE_TABLET | Freq: Two times a day (BID) | ORAL | Status: DC
  Administered 2021-09-12 – 2021-09-14 (×4): 500 mg via ORAL
  Filled 2021-09-12 (×4): qty 2

## 2021-09-12 NOTE — Progress Notes (Signed)
Patient arrived from ED, Alert and oriented to self and time, VSS, no sign of acute distress, denied SOB or CP, been at the MRI since then, will continue to monitor.

## 2021-09-12 NOTE — H&P (Addendum)
History and Physical    Patient: Brendan Long HCW:[id number] DOB: [birth year] DOA: 09/12/2021 DOS: the patient was seen and examined on 09/12/2021 PCP: Isaac Bliss, Rayford Halsted, MD  [facility]: [facility] Complaint: left facial droop, left sided weakness   HPI: [name] is a 88 y.o. male with medical history significant of HTN, HLD, hx of CVA, PAF s/p ablation 15 years ago not on anticoagulation with recent SDH, recent subdural hematoma s/p fall. History is from his daughter/chart. He is somnolent, but will arouse and answer some questions. Unsure why he is here.   He was doing PT this AM and the physical therapist noticed he was weaker and more lethargic than normal and then noticed a left facial droop and left sided weakness. He also had some slurred speech, but daughter states his speech is intermittently slurred.  He also seems confused. He at baseline walks with a walker and is ambulatory. His daughter states since his fall with his brain bleed in January he has not been the same. He will sometimes answer questions appropriately and other times seem confused. Unsure how far off baseline he is. He has been more lethargic and sleepy over the past week. He has also been staying awake through the night. He has also had poor PO intake over the last 3 weeks and daughter states over last week has hardly ate or drank anything.   Recent hospitalization 1/17-1/25/23 for fall and subdural hematoma. Neurosurgery admitted with observation. He developed urinary retention, but was removed prior to discharge. He alos had what appeared to be a seziure on 1/19 and 1/22. Stroke ruled out. ? Hypotension. Neurology started keppra, but changed to depakote on 08/30/21 due to rash. Eliquis was held. He also has a foley and saw urology where they had to place it again this past week as he continued to have urinary retention.   ROS can not be obtained.    ER Course:   vitals: afebrile, bp: 139/65, HR: 73, RR: 16, oxygen: 95%RA Pertinent labs: wbc: 14.6, platelets: 132, creatinine: 2.00, BUN: 35,  CTH: no acute hemorrhage. Increased ill-defined low -density in the region of the right corona radiata and basal ganglia may reflect recent small vessel infarcts.  Mixed density left subdural hematoma, likely similar in size. Decreased hyperdense hemorrhage.  CTA head: Severe stenosis versus short-segment occlusion of the left P2 PCA. 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 Distal PCAs are opacified bilaterally, but small and irregular. 2. Severe stenosis of the right A2 ACA. 3. Multifocal severe bilateral M2 MCA stenosis. 4. Moderate right paraclinoid ICA and bilateral M1 MCA stenosis  CTA neck: 60% stenosis of left carotid origin. Moderate stenosis of left intradural vertebral artery at C4-C% due to mass effect from adjacent osteophytes. Moderate stenosis of the left subclavian artery origin.  IN ED: code stroke, 500cc bolus, neurology consulted.    Review of Systems: unable to review all systems due to the inability of the patient to answer questions. Past Medical History:  Diagnosis Date   ED (erectile dysfunction)    Hyperlipidemia    Hypertension    Hypothyroidism    Psoriasis    Stroke ([place])    Varicose veins    Vitamin D deficiency    Past Surgical History:  Procedure Laterality Date   JOINT REPLACEMENT     joint replacement rt and left     rotator cuff  repair, right shoulder per Dr. Tonita Cong  1996   torn right bicepts muscle  [place]  01/17/10   redone on right hip on 09/05/10   Social History:  reports that he has quit smoking. He has never used smokeless tobacco. He reports that he does not drink alcohol and does not use drugs.  No Known Allergies  Family History  Problem Relation Age of Onset   Breast cancer Other     Prior to Admission  medications   Medication Sig Start Date End Date Taking? Authorizing Provider  atorvastatin (LIPITOR) 80 MG tablet TAKE 1 TABLET DAILY Patient taking differently: Take 80 mg by mouth daily. 07/24/17   Renato Shin, MD  diclofenac sodium (VOLTAREN) 1 % GEL APPLY 4 GRAMS TOPICALLY FOUR TIMES A DAY AS NEEDED FOR KNEE PAIN Patient taking differently: Apply 4 g topically 4 (four) times daily as needed (pain). 04/22/18   Renato Shin, MD  divalproex (DEPAKOTE) 500 MG DR tablet Take 1 tablet (500 mg total) by mouth every 12 (twelve) hours. 08/31/21   Viona Gilmore D, NP  hydrochlorothiazide (MICROZIDE) 12.5 MG capsule Take 1 capsule by mouth daily. 06/12/16   [provider]  HYDROcodone-acetaminophen (NORCO/VICODIN) 5-325 MG tablet Take 1 tablet by mouth every 6 (six) hours as needed for moderate pain or severe pain. 08/30/21   Viona Gilmore D, NP  ketoconazole (NIZORAL) 2 % cream Apply 1 application topically 2 (two) times daily as needed for irritation. 05/19/21   Laurey Morale, MD  metoprolol tartrate (LOPRESSOR) 50 MG tablet Take 50 mg by mouth 2 (two) times daily.    [provider]  tamsulosin (FLOMAX) 0.4 MG CAPS capsule Take 1 capsule (0.4 mg total) by mouth daily. 08/25/21   Bergman, Trinda Pascal D, NP  TRICOR 48 MG tablet TAKE 1 TABLET DAILY Patient taking differently: Take 48 mg by mouth daily. 07/15/19   Erline Hau, MD    Physical Exam: Vitals:   09/12/21 1430 09/12/21 1438 09/12/21 1650 09/12/21 2000  BP: (!) 145/70  (!) 141/67 105/64  Pulse: 75 74 80 67  Resp: (!) 21 20 14 17   Temp:   98.5 F (36.9 C) 98.7 F (37.1 C)  TempSrc:   Oral Oral  SpO2: 93% 96% 100% 95%  Weight:      Height:       General:  Appears calm and comfortable and is in NAD. Very lethargic/somnolent  Eyes:  PERRL, EOMI, normal lids, iris ENT:  grossly normal hearing, lips & tongue, mmm; no teeth  Neck:  no LAD, masses or thyromegaly; no carotid bruits Cardiovascular:  RRR, no  m/r/g. No LE edema.  Respiratory:   CTA bilaterally with no wheezes/rales/rhonchi.  Normal respiratory effort. Abdomen:  soft, NT, ND, NABS Back:   normal alignment, no CVAT Skin:  no rash or induration seen on limited exam Musculoskeletal:  LUE: 4/5, RUE: 5/5, bilateral LE: 4/5. good ROM, no bony abnormality Lower extremity:  No LE edema.  Limited foot exam with no ulcerations.  2+ distal pulses. Psychiatric:  grossly normal mood and affect, speech fluent, but slow and appropriate, alert to self/birthday, does not know place/date.  Neurologic:  CN 2-12 grossly intact-limited exam. Did not follow all directions. moves all extremities in coordinated fashion, sensation intact. Limited exam. CNT FTN, HTK, pronator drift. Could not follow eye exam.    Radiological Exams on Admission: Independently reviewed - see discussion in A/P where applicable  DG CHEST PORT  1 VIEW  Result Date: 09/12/2021 CLINICAL DATA:  Altered mental status. EXAM: PORTABLE CHEST 1 VIEW COMPARISON:  August 23, 2021. FINDINGS: The heart size and mediastinal contours are within normal limits. Both lungs are clear. The visualized skeletal structures are unremarkable. IMPRESSION: No active disease. Electronically Signed   By: Marijo Conception M.D.   On: 09/12/2021 15:04   CT HEAD CODE STROKE WO CONTRAST  Result Date: 09/12/2021 CLINICAL DATA:  Code stroke. EXAM: CT HEAD WITHOUT CONTRAST TECHNIQUE: Contiguous axial images were obtained from the base of the skull through the vertex without intravenous contrast. RADIATION DOSE REDUCTION: This exam was performed according to the departmental dose-optimization program which includes automated exposure control, adjustment of the mA and/or kV according to patient size and/or use of iterative reconstruction technique. COMPARISON:  08/25/2021 FINDINGS: Brain: Hypodense to isodense left subdural hematoma is again identified and is likely not substantially changed in size. There are decreased  dependent hyperdense blood products. No new hemorrhage. There is increased ill-defined hypodensity in the region of the right corona radiata and basal ganglia. Chronic right occipitoparietal infarct. Chronic right cerebellar infarct. Chronic infarcts of the left corona radiata and basal ganglia. Vascular: No hyperdense vessel. Skull: Unremarkable. Sinuses/Orbits: Mild mucosal thickening. Evidence of chronic sinus inflammation. Left lens replacement. Other: Mastoid air cells are clear. ASPECTS [facility] Stroke Program Early CT Score) - Ganglionic level infarction (caudate, lentiform nuclei, internal capsule, insula, M1-M3 cortex): 5 - Supraganglionic infarction (M4-M6 cortex): 3 Total score (0-10 with 10 being normal): 8 IMPRESSION: There is no acute intracranial hemorrhage. Increased ill-defined low-density in the region of the right corona radiata and basal ganglia may reflect recent small vessel infarcts. ASPECT score is 8. Mixed density left subdural hematoma likely similar in size. Decreased hyperdense hemorrhage. Electronically Signed   By: Macy Mis M.D.   On: 09/12/2021 12:22   CT ANGIO HEAD NECK W WO CM (CODE STROKE)  Addendum Date: 09/12/2021   ADDENDUM REPORT: 09/12/2021 13:12 ADDENDUM: Findings discussed with Dr. Reeves Forth via telephone at 1:10 PM Electronically Signed   By: Margaretha Sheffield M.D.   On: 09/12/2021 13:12   Result Date: 09/12/2021 CLINICAL DATA:  Code stroke. EXAM: CT ANGIOGRAPHY HEAD AND NECK TECHNIQUE: Multidetector CT imaging of the head and neck was performed using the standard protocol during bolus administration of intravenous contrast. Multiplanar CT image reconstructions and MIPs were obtained to evaluate the vascular anatomy. Carotid stenosis measurements (when applicable) are obtained utilizing NASCET criteria, using the distal internal carotid diameter as the denominator. RADIATION DOSE REDUCTION: This exam was performed according to the departmental dose-optimization  program which includes automated exposure control, adjustment of the mA and/or kV according to patient size and/or use of iterative reconstruction technique. CONTRAST:  124mL OMNIPAQUE IOHEXOL 350 MG/ML SOLN COMPARISON:  None. FINDINGS: CTA NECK FINDINGS Aortic arch: Extensive and ulcerated atherosclerosis of the visualized aorta. Right subclavian artery origin is not imaged. Right carotid system: Atherosclerosis at the carotid bifurcation without greater than 50% stenosis. Left carotid system: Approximately 60% stenosis of the left carotid artery origin. Atherosclerosis at the carotid bifurcation without greater than% stenosis. Vertebral arteries: Patent. Moderate stenosis of the left intradural vertebral artery at C4-C5 due to mass effect from adjacent osteophytes. Skeleton: Other neck: Upper chest: Review of the MIP images confirms the above findings CTA HEAD FINDINGS Anterior circulation: Bilateral intracranial ICAs are patent with moderate right and mild left paraclinoid ICA stenosis. Bilateral MCAs are patent with moderate bilateral M1 MCA stenosis. Multifocal severe bilateral  M2 MCA stenosis. Bilateral ACAs are patent. Multifocal severe stenosis of the right A2 ACA. Posterior circulation: Severe stenosis of the left intradural vertebral artery. Severe stenosis of the distal basilar artery. Severe stenosis of the left P1 PCA. Severe stenosis versus short-segment occlusion of the left P2 PCA proximally with opacified small distal PCA. Severe stenosis of the distal right P2 PCA with opacified small distal PCA. Venous sinuses: Nondiagnostic evaluation due to arterial timing Review of the MIP images confirms the above findings Dr. Reeves Forth paged at the time of dication for call report. Awaiting call back. IMPRESSION: CTA head: 1. Severe stenosis versus short-segment occlusion of the left P2 PCA. 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 Distal PCAs  are opacified bilaterally, but small and irregular. 2. Severe stenosis of the right A2 ACA. 3. Multifocal severe bilateral M2 MCA stenosis. 4. Moderate right paraclinoid ICA and bilateral M1 MCA stenosis. CTA neck: 1. Approximately 60% stenosis of the left carotid artery origin. 2. Moderate stenosis of the left intradural vertebral artery at C4-C5 due to mass effect from adjacent osteophytes. 3. Moderate stenosis of the left subclavian artery origin. Right subclavian artery origin is not imaged. 4. Ulcerated aortic atherosclerosis (ICD10-I70.0). Electronically Signed: By: Margaretha Sheffield M.D. On: 09/12/2021 13:05    EKG: Independently reviewed.  NSR with rate 95, RBBB; nonspecific ST changes with no evidence of acute ischemia. Similar to previous.    Labs on Admission: I have personally reviewed the available labs and imaging studies at the time of the admission.  Pertinent labs:   wbc: 14.6,  platelets: 132,  creatinine: 2.00,  BUN: 35,   Assessment and Plan: * Left-sided weakness 88 year old male with left sided weakness, slurred speech and left facial droop with some possible confusion while doing therapy today -place in obs on telemetry -neurology consulted in ED and did not think head CT and head CTA showed any new findings -he has some appreciated left sided weakness on exam and very subtle droop. Speech is slurred, but he doesn't have his dentures and his daughter states this is not new.  -check MRI with history of atrial fibrillation off anticoagulation and findings on exam  -continue with neuro checks per protocol -check a1c/lipid panel for the Am -check metabolic labs for acute encephalopathy -palliative care consult for decline over past 3 weeks  -echo 10/21 with no shunts -NPO until passes swallow screen with AMS and weakness -ST/PT/OT  -f/u on neurology recs.   Acute encephalopathy- (present on admission) Unsure how far off baseline he is, but daughter thinks he is  weaker, more lethargic than normal Protecting airway  -Lab work so far unremarkable, UA still pending. Does have foley. Per EMS had fever to 100.2, none recorded here -UA +for UTI, start rocephin, culture pending  -will check metabolic labs, hx of G26 deficiency in the past -does have WBC to 14.6, but likely reactive. Fever recorded by Ems, none here, f/u on UA. Will check CXR as well and blood cx.  -? If at baseline, or worsening from his SDH, this is stable to decreased in size though  -checking MRI to r/o CVA, does have history of history of seizures, checking Depakote level. ? eeg per neurology  -very significant decline in PO intake and daughter states he has hardly ate or drank over the past week, continue IVF and palliative care has been consulted.  -NPO until passes bedside swallow screen   Acute  kidney injury superimposed on CKD ([place])- (present on admission) Baseline creatinine: 1.6-1.7 Likely pre renal in setting of markedly decreased PO intake over past 3 weeks Has foley catheter in place Follow I/O UA pending Hold hctz and avoid other nephrotoxic drugs Gentle IVF  Trend bmp   AF (paroxysmal atrial fibrillation) ([place])- (present on admission) History of atrial fibrillation CHA2Ds2-vasc of 6 eliquis held in January due to SDH in January of this year, continue to hold. Unsure if he was on this as a note from his PCP in 05/2021 states the [place] told him to stop before this time. Family is unsure.  Has had multiple falls Hold metroprolol for permissive HTN   Subdural hematoma- (present on admission) S/p fall on 1/17 and admitted by neurosurgery for observation  eliquis stopped Started on depakote after possible seizure x2 and allergic reaction to keppra CTH shows stable SDH, slightly smaller overall decline since this fall  SCD/TED hose for VTE prophylaxis   Essential hypertension- (present on admission) Well controlled.  . Hold hctz with AKI.  Hold metoprolol to allow for  permissive HTN  DM (diabetes mellitus), type 2 with renal complications ([place])- (present on admission) a1c 6.0 in 05/2021 On no oral medication Sensitive SSI while inpatient Liberalize diet to regular with hx of poor PO intake over last 3 weeks   Dyslipidemia- (present on admission) Continue Lipitor and tricor   Seizures ([place]) Secondary to SDH Seizure precautions Check depakote level with increased lethargy/confusion  eeg per neurology   Acute urinary retention- (present on admission) Failed trial without catheter on 08/29/21. Placed again by urology Continue foley, urology outpatient   History of CVA (cerebrovascular accident) in 2021 -started on eliquis at this time in 10/21, it appears it has since been discontinued with no anti-platelet therapy. [facility]. ? If due to falls  -has SDH, but when appropriate would start back antiplatelet  -continue statin     Advance Care Planning:   Code Status: DNR   Consults: palliative care, neurology   DVT Prophylaxis: SCDs/TED  Family Communication: daughter at bedside: [name]   Severity of Illness: The appropriate patient status for this patient is OBSERVATION. Observation status is judged to be reasonable and necessary in order to provide the required intensity of service to ensure the patient's safety. The patient's presenting symptoms, physical exam findings, and initial radiographic and laboratory data in the context of their medical condition is felt to place them at decreased risk for further clinical deterioration. Furthermore, it is anticipated that the patient will be medically stable for discharge from the hospital within 2 midnights of admission.   Author: Orma Flaming, MD 09/12/2021 8:48 PM  For on call review [email or web address].

## 2021-09-12 NOTE — ED Triage Notes (Signed)
Pt presents to ED with c/o L side weakness and AMS. LKW 2200 last night. Facility reports PT was working with pt this am and was lethargic, usually able to ambulate well and was having difficulty. EMS reports L side weakness, facial droop. Pt had a fall 2 weeks ago and was dx with subdural hematoma, d/c eliquis. 18G bilateral AC placed by EMS.

## 2021-09-12 NOTE — Assessment & Plan Note (Addendum)
Baseline creatinine: 1.6-1.7 Likely pre renal in setting of markedly decreased PO intake over past 3 weeks Hold hctz and avoid other nephrotoxic drugs Creatinine is around 1.85.

## 2021-09-12 NOTE — Assessment & Plan Note (Signed)
Continue Lipitor and tricor

## 2021-09-12 NOTE — Assessment & Plan Note (Addendum)
88 year old male with left sided weakness, slurred speech and left facial droop with some possible confusion while doing therapy today -place in obs on telemetry -neurology consulted in ED and did not think head CT and head CTA showed any new findings -he has some appreciated left sided weakness on exam and very subtle droop. Speech is slurred, but he doesn't have his dentures and his daughter states this is not new.  -check MRI with history of atrial fibrillation off anticoagulation and findings on exam  -continue with neuro checks per protocol -check a1c/lipid panel for the Am -check metabolic labs for acute encephalopathy -palliative care consult for decline over past 3 weeks  -echo 10/21 with no shunts -NPO until passes swallow screen with AMS and weakness

## 2021-09-12 NOTE — ED Provider Notes (Signed)
[facility] EMERGENCY DEPARTMENT Provider Note   CSN: [id number] Arrival date & time: 09/12/21  1148     History  Chief Complaint  Patient presents with   Altered Mental Status   Weakness    Brendan Long is a 88 y.o. male who presents to the emergency department from rehab facility for left-sided weakness and altered mental status.  Last known normal 10:00 last night.  Physical therapist at the facility reports that he was working with the patient this morning patient was very lethargic, but he is usually able to ambulate well.  They report that he had left-sided weakness and facial droop.  Patient had a fall 2 weeks ago and was diagnosed with a subdural hematoma, his Eliquis was discontinued at that time and has been in the facility since.  Upon my interview with the patient he states when he woke up this morning that he did not feel well, but is unable to describe this further to me.  He is not complaining of any pain.   Altered Mental Status Associated symptoms: weakness   Weakness    Past Medical History:  Diagnosis Date   ED (erectile dysfunction)    Hyperlipidemia    Hypertension    Hypothyroidism    Psoriasis    Stroke ([place])    Varicose veins    Vitamin D deficiency      Home Medications Prior to Admission medications   Medication Sig Start Date End Date Taking? Authorizing Provider  atorvastatin (LIPITOR) 80 MG tablet TAKE 1 TABLET DAILY Patient taking differently: Take 80 mg by mouth every morning. 07/24/17  Yes Renato Shin, MD  diclofenac sodium (VOLTAREN) 1 % GEL APPLY 4 GRAMS TOPICALLY FOUR TIMES A DAY AS NEEDED FOR KNEE PAIN Patient taking differently: Apply 4 g topically 4 (four) times daily as needed (knee pain). 04/22/18  Yes Renato Shin, MD  divalproex (DEPAKOTE) 500 MG DR tablet Take 1 tablet (500 mg total) by mouth every 12 (twelve) hours. 08/31/21  Yes Viona Gilmore D, NP  finasteride (PROSCAR) 5 MG tablet Take 5 mg by mouth  every morning.   Yes [provider]  hydrochlorothiazide (MICROZIDE) 12.5 MG capsule Take 12.5 mg by mouth every morning. 06/12/16  Yes [provider]  HYDROcodone-acetaminophen (NORCO/VICODIN) 5-325 MG tablet Take 1 tablet by mouth every 6 (six) hours as needed for moderate pain or severe pain. Patient taking differently: Take 1 tablet by mouth every 6 (six) hours as needed (knee pain). 08/30/21  Yes Viona Gilmore D, NP  ketoconazole (NIZORAL) 2 % cream Apply 1 application topically 2 (two) times daily as needed for irritation. Patient taking differently: Apply 1 application topically 2 (two) times daily as needed for irritation (psoriasis). 05/19/21  Yes Laurey Morale, MD  metoprolol tartrate (LOPRESSOR) 50 MG tablet Take 50 mg by mouth 2 (two) times daily.   Yes [provider]  sodium chloride (OCEAN) 0.65 % SOLN nasal spray Place 2 sprays into both nostrils every 6 (six) hours as needed for congestion.   Yes [provider]  tamsulosin (FLOMAX) 0.4 MG CAPS capsule Take 1 capsule (0.4 mg total) by mouth daily. Patient taking differently: Take 0.4 mg by mouth every morning. 08/25/21  Yes Bergman, Meghan D, NP  TRICOR 48 MG tablet TAKE 1 TABLET DAILY Patient taking differently: Take 48 mg by mouth every morning. 07/15/19  Yes Isaac Bliss, Rayford Halsted, MD      Allergies    Patient has no  known allergies.    Review of Systems   Review of Systems  Neurological:  Positive for facial asymmetry, speech difficulty and weakness.  All other systems reviewed and are negative.  Physical Exam Updated Vital Signs BP 139/65 (BP Location: Right Arm)    Pulse 73    Temp 98.2 F (36.8 C) (Oral)    Resp 16    Ht 5' 10.5" (1.791 m)    Wt 70.9 kg    SpO2 95%    BMI 22.12 kg/m  Physical Exam Vitals and nursing note reviewed.  Constitutional:      Appearance: Normal appearance.  HENT:     Head: Normocephalic and atraumatic.  Eyes:     Conjunctiva/sclera:  Conjunctivae normal.  Cardiovascular:     Rate and Rhythm: Normal rate and regular rhythm.  Pulmonary:     Effort: Pulmonary effort is normal. No respiratory distress.     Breath sounds: Normal breath sounds.  Abdominal:     General: There is no distension.     Palpations: Abdomen is soft.     Tenderness: There is no abdominal tenderness.  Skin:    General: Skin is warm and dry.  Neurological:     General: No focal deficit present.     Mental Status: He is alert.     Comments: Neuro: Speech is somewhat garbled, but overall clear.  He is able to follow basic commands, difficulty with E OMI testing.  Mild left-sided facial droop.  Decreased reactivity of right pupil compared to left.  Strength 5/5 in the upper extremities, 3/5 in the left leg, 4/5 in the right leg.  Sensation intact overall.  Patient is too weak for pronator testing.    ED Results / Procedures / Treatments   Labs (all labs ordered are listed, but only abnormal results are displayed) Labs Reviewed  CBC - Abnormal; Notable for the following components:      Result Value   WBC 14.6 (*)    Platelets 132 (*)    All other components within normal limits  DIFFERENTIAL - Abnormal; Notable for the following components:   Neutro Abs 11.8 (*)    Monocytes Absolute 1.3 (*)    Abs Immature Granulocytes 0.08 (*)    All other components within normal limits  COMPREHENSIVE METABOLIC PANEL - Abnormal; Notable for the following components:   Sodium 132 (*)    Chloride 94 (*)    Glucose, Bld 130 (*)    BUN 35 (*)    Creatinine, Ser 2.06 (*)    Total Protein 6.4 (*)    Albumin 3.0 (*)    GFR, Estimated 30 (*)    All other components within normal limits  CBG MONITORING, ED - Abnormal; Notable for the following components:   Glucose-Capillary 134 (*)    All other components within normal limits  I-STAT CHEM 8, ED - Abnormal; Notable for the following components:   BUN 35 (*)    Creatinine, Ser 2.00 (*)    Glucose, Bld 128  (*)    Calcium, Ion 1.06 (*)    All other components within normal limits  RESP PANEL BY RT-PCR (FLU A&B, COVID) ARPGX2  ETHANOL  PROTIME-INR  APTT  RAPID URINE DRUG SCREEN, HOSP PERFORMED  URINALYSIS, ROUTINE W REFLEX MICROSCOPIC    EKG None  Radiology CT HEAD CODE STROKE WO CONTRAST  Result Date: 09/12/2021 CLINICAL DATA:  Code stroke. EXAM: CT HEAD WITHOUT CONTRAST TECHNIQUE: Contiguous axial images were obtained from the base  of the skull through the vertex without intravenous contrast. RADIATION DOSE REDUCTION: This exam was performed according to the departmental dose-optimization program which includes automated exposure control, adjustment of the mA and/or kV according to patient size and/or use of iterative reconstruction technique. COMPARISON:  08/25/2021 FINDINGS: Brain: Hypodense to isodense left subdural hematoma is again identified and is likely not substantially changed in size. There are decreased dependent hyperdense blood products. No new hemorrhage. There is increased ill-defined hypodensity in the region of the right corona radiata and basal ganglia. Chronic right occipitoparietal infarct. Chronic right cerebellar infarct. Chronic infarcts of the left corona radiata and basal ganglia. Vascular: No hyperdense vessel. Skull: Unremarkable. Sinuses/Orbits: Mild mucosal thickening. Evidence of chronic sinus inflammation. Left lens replacement. Other: Mastoid air cells are clear. ASPECTS [facility] Stroke Program Early CT Score) - Ganglionic level infarction (caudate, lentiform nuclei, internal capsule, insula, M1-M3 cortex): 5 - Supraganglionic infarction (M4-M6 cortex): 3 Total score (0-10 with 10 being normal): 8 IMPRESSION: There is no acute intracranial hemorrhage. Increased ill-defined low-density in the region of the right corona radiata and basal ganglia may reflect recent small vessel infarcts. ASPECT score is 8. Mixed density left subdural hematoma likely similar in size.  Decreased hyperdense hemorrhage. Electronically Signed   By: Macy Mis M.D.   On: 09/12/2021 12:22   CT ANGIO HEAD NECK W WO CM (CODE STROKE)  Addendum Date: 09/12/2021   ADDENDUM REPORT: 09/12/2021 13:12 ADDENDUM: Findings discussed with Dr. Reeves Forth via telephone at 1:10 PM Electronically Signed   By: Margaretha Sheffield M.D.   On: 09/12/2021 13:12   Result Date: 09/12/2021 CLINICAL DATA:  Code stroke. EXAM: CT ANGIOGRAPHY HEAD AND NECK TECHNIQUE: Multidetector CT imaging of the head and neck was performed using the standard protocol during bolus administration of intravenous contrast. Multiplanar CT image reconstructions and MIPs were obtained to evaluate the vascular anatomy. Carotid stenosis measurements (when applicable) are obtained utilizing NASCET criteria, using the distal internal carotid diameter as the denominator. RADIATION DOSE REDUCTION: This exam was performed according to the departmental dose-optimization program which includes automated exposure control, adjustment of the mA and/or kV according to patient size and/or use of iterative reconstruction technique. CONTRAST:  116mL OMNIPAQUE IOHEXOL 350 MG/ML SOLN COMPARISON:  None. FINDINGS: CTA NECK FINDINGS Aortic arch: Extensive and ulcerated atherosclerosis of the visualized aorta. Right subclavian artery origin is not imaged. Right carotid system: Atherosclerosis at the carotid bifurcation without greater than 50% stenosis. Left carotid system: Approximately 60% stenosis of the left carotid artery origin. Atherosclerosis at the carotid bifurcation without greater than% stenosis. Vertebral arteries: Patent. Moderate stenosis of the left intradural vertebral artery at C4-C5 due to mass effect from adjacent osteophytes. Skeleton: Other neck: Upper chest: Review of the MIP images confirms the above findings CTA HEAD FINDINGS Anterior circulation: Bilateral intracranial ICAs are patent with moderate right and mild left paraclinoid ICA stenosis.  Bilateral MCAs are patent with moderate bilateral M1 MCA stenosis. Multifocal severe bilateral M2 MCA stenosis. Bilateral ACAs are patent. Multifocal severe stenosis of the right A2 ACA. Posterior circulation: Severe stenosis of the left intradural vertebral artery. Severe stenosis of the distal basilar artery. Severe stenosis of the left P1 PCA. Severe stenosis versus short-segment occlusion of the left P2 PCA proximally with opacified small distal PCA. Severe stenosis of the distal right P2 PCA with opacified small distal PCA. Venous sinuses: Nondiagnostic evaluation due to arterial timing Review of the MIP images confirms the above findings Dr. Reeves Forth paged at the time of dication  for call report. Awaiting call back. IMPRESSION: CTA head: 1. Severe stenosis versus short-segment occlusion of the left P2 PCA. 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 Distal PCAs are opacified bilaterally, but small and irregular. 2. Severe stenosis of the right A2 ACA. 3. Multifocal severe bilateral M2 MCA stenosis. 4. Moderate right paraclinoid ICA and bilateral M1 MCA stenosis. CTA neck: 1. Approximately 60% stenosis of the left carotid artery origin. 2. Moderate stenosis of the left intradural vertebral artery at C4-C5 due to mass effect from adjacent osteophytes. 3. Moderate stenosis of the left subclavian artery origin. Right subclavian artery origin is not imaged. 4. Ulcerated aortic atherosclerosis (ICD10-I70.0). Electronically Signed: By: Margaretha Sheffield M.D. On: 09/12/2021 13:05    Procedures Procedures    Medications Ordered in ED Medications  sodium chloride 0.9 % bolus 500 mL (has no administration in time range)  iohexol (OMNIPAQUE) 350 MG/ML injection 100 mL (100 mLs Intravenous Contrast Given 09/12/21 1213)    ED Course/ Medical Decision Making/ A&P                           Medical Decision Making Amount and/or Complexity of Data  Reviewed Labs: ordered. Radiology: ordered.   This patient presents to the ED for concern of weakness, this involves an extensive number of treatment options, and is a complaint that carries with it a high risk of complications and morbidity. The emergent differential diagnosis includes, but is not limited to,  The differential diagnosis of weakness includes but is not limited to neurologic causes (GBS, myasthenia gravis, CVA, MS, ALS, transverse myelitis, spinal cord injury, CVA, botulism, ) and other causes: ACS, Arrhythmia, syncope, orthostatic hypotension, sepsis, hypoglycemia, electrolyte disturbance, hypothyroidism, respiratory failure, symptomatic anemia, dehydration, heat injury, polypharmacy, malignancy .   Co morbidities that complicate the patient evaluation: stroke, subdural hematoma, atrial fibrillation, diabetes, chronic kidney disease stage III Social Determinants of Health: Currently residing in rehab facility after hospitalization for subdural hematoma  Additional history obtained from chart review. External records from outside source obtained and reviewed including neurosurgery discharge summary from 1/25.  Physical Exam: Physical exam performed. The pertinent findings include: Left leg weakness, mildly slurred speech.  Sensation intact overall.  Alert and oriented.  Lab Tests: I Ordered, and personally interpreted labs.  The pertinent results include: 132, chloride 94, creatinine 2.06 increased compared to baseline, leukocytosis 14.6,   Imaging Studies: I ordered imaging studies including CT head code stroke, CT angio head and neck. I independently visualized and interpreted imaging which showed no acute intracranial hemorrhage, questionable small vessel infarct in the basal ganglia and right corona radiata.  Left subdural hematoma similar in size compared to prior.  60% stenosis of left carotid, no large vessel occlusion.. I agree with the radiologist interpretation.    Cardiac Monitoring:  The patient was maintained on a cardiac monitor.  My attending physician Dr. Regenia Skeeter viewed and interpreted the cardiac monitored which showed an underlying rhythm of: sinus rhythm   Medications: I ordered medication including IV fluids for mild hyponatremia and hypochloremia. I have reviewed the patients home medicines and have made adjustments as needed.  Consultations Obtained: I requested consultation with the stroke team,  and discussed lab and imaging findings as well as pertinent plan - they recommend: medical admission with neurology observation.   Dispostion: After consideration of the diagnostic results and the patients response to treatment, I feel that patient  is requiring admission and inpatient treatment for symptoms.  Consult hospitalist Dr. Eliberto Ivory who admitted patient to the medical service for weakness, and strokelike symptoms. The patient appears reasonably stabilized for admission considering the current resources, flow, and capabilities available in the ED at this time, and I doubt any other [facility] requiring further screening and/or treatment in the ED prior to admission.  Final Clinical Impression(s) / ED Diagnoses Final diagnoses:  Weakness  Stroke-like symptoms    Rx / DC Orders ED Discharge Orders     None      Portions of this report may have been transcribed using voice recognition software. Every effort was made to ensure accuracy; however, inadvertent computerized transcription errors may be present.    Estill Cotta 09/12/21 1323    Sherwood Gambler, MD 09/13/21 [id number]

## 2021-09-12 NOTE — Progress Notes (Signed)
Physical Therapy Evaluation Patient Details Name: Brendan Long MRN: [id number] DOB: 02-10-1929 Today's Date: 09/12/2021  History of Present Illness  88 yo male was admitted 2/6 for onset of lethargic appearance, poor sleep and intake for 3 weeks and onset of L facial and L hemi weakness.  Had Severe stenosis versus short-segment occlusion of the left P2 PCA. 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  Recent falls with L SDH and new confusion, recent seizures, return of urinary retention, and confusion.  PMHx:  HTN, HLD, CVA, PAF, ablation, B THR's, R rotator cuff repair, hypothyroidism  Clinical Impression  Pt was seen for evaluation with notable difficulty using L side to support sitting, and cannot sit on L hip or initiate movement on LUE without both tactile and verbal cues.  Pt is recommended to get therapy with SNF care due to his dense inattention to L side, struggle to mobilize L side and generally mild confusion and inability to give history to PT on his recent functional level.  SNF care to be followed up with family to assist with finding appropriate level of care for a home living situation.  Encourage OOB to chair and WB on L side with strengthening and balance training to use L side.     Recommendations for follow up therapy are one component of a multi-disciplinary discharge planning process, led by the attending physician.  Recommendations may be updated based on patient status, additional functional criteria and insurance authorization.  Follow Up Recommendations Skilled nursing-short term rehab (<3 hours/day)    Assistance Recommended at Discharge Frequent or constant Supervision/Assistance  Patient can return home with the following  Two people to help with walking and/or transfers;Two people to help with bathing/dressing/bathroom;Assistance with cooking/housework;Assistance with feeding    Equipment  Recommendations None recommended by PT  Recommendations for Other Services       Functional Status Assessment Patient has had a recent decline in their functional status and demonstrates the ability to make significant improvements in function in a reasonable and predictable amount of time.     Precautions / Restrictions Precautions Precautions: Fall Restrictions Weight Bearing Restrictions: No      Mobility  Bed Mobility Overal bed mobility: Needs Assistance Bed Mobility: Sidelying to Sit, Sit to Sidelying Rolling: Mod assist Sidelying to sit: Max assist     Sit to sidelying: Max assist General bed mobility comments: pt is struggling with coordinating motor planning to assist to sit and return to bed.    Transfers Overall transfer level: Needs assistance                 General transfer comment: unable to stand due to poor sitting balance control    Ambulation/Gait                  Stairs            Wheelchair Mobility    Modified Rankin (Stroke Patients Only) Modified Rankin (Stroke Patients Only) Pre-Morbid Rankin Score: Slight disability Modified Rankin: Severe disability     Balance Overall balance assessment: Needs assistance Sitting-balance support: Feet supported, Bilateral upper extremity supported Sitting balance-Leahy Scale: Poor                                       Pertinent Vitals/Pain Pain Assessment Pain Assessment: Faces Faces Pain Scale: Hurts a  little bit Breathing: normal    Home Living Family/patient expects to be discharged to:: Unsure Living Arrangements: Alone Available Help at Discharge: Family;Available 24 hours/day;Available PRN/intermittently Type of Home: House Home Access: Stairs to enter   [employer] of Steps: 2 steps   Home Layout: One level Home Equipment: Chartered certified accountant;Shower seat - built in Additional Comments: history from chart    Prior Function Prior Level of  Function : Needs assist       Physical Assist : Mobility (physical) Mobility (physical): Gait   Mobility Comments: RW       Hand Dominance   Dominant Hand: Right    Extremity/Trunk Assessment   Upper Extremity Assessment Upper Extremity Assessment: Generalized weakness    Lower Extremity Assessment Lower Extremity Assessment: Generalized weakness    Cervical / Trunk Assessment Cervical / Trunk Assessment: Kyphotic  Communication   Communication: Expressive difficulties  Cognition Arousal/Alertness: Lethargic Behavior During Therapy: Impulsive, Flat affect Overall Cognitive Status: Impaired/Different from baseline Area of Impairment: Orientation, Attention, Memory, Following commands, Safety/judgement, Awareness, Problem solving                 Orientation Level: Time, Situation Current Attention Level: Selective Memory: Decreased short-term memory, Decreased recall of precautions Following Commands: Follows one step commands with increased time Safety/Judgement: Decreased awareness of deficits, Decreased awareness of safety Awareness: Intellectual Problem Solving: Slow processing, Requires verbal cues, Requires tactile cues General Comments: pt is reluctant to try to move and at some point begant to struggle with maintaining sitting posture        General Comments General comments (skin integrity, edema, etc.): pt is able to be assisted to sit and then requires heavy support to avoid leaning to R side.  Pt is quickly leaning over to return to bed and did not follow cues well    Exercises     Assessment/Plan    PT Assessment Patient needs continued PT services  PT Problem List Decreased strength;Decreased range of motion;Decreased balance;Decreased activity tolerance;Decreased mobility;Decreased coordination;Decreased knowledge of use of DME;Decreased safety awareness;Cardiopulmonary status limiting activity       PT Treatment Interventions DME  instruction;Gait training;Functional mobility training;Therapeutic activities;Therapeutic exercise;Balance training;Neuromuscular re-education;Patient/family education;Stair training    PT Goals (Current goals can be found in the Care Plan section)  Acute Rehab PT Goals Patient Stated Goal: none stated PT Goal Formulation: Patient unable to participate in goal setting Time For Goal Achievement: 09/26/21 Potential to Achieve Goals: Fair    Frequency Min 3X/week     Co-evaluation               AM-PAC PT "6 Clicks" Mobility  Outcome Measure Help needed turning from your back to your side while in a flat bed without using bedrails?: A Lot Help needed moving from lying on your back to sitting on the side of a flat bed without using bedrails?: A Lot Help needed moving to and from a bed to a chair (including a wheelchair)?: A Lot Help needed standing up from a chair using your arms (e.g., wheelchair or bedside chair)?: Total Help needed to walk in hospital room?: Total Help needed climbing 3-5 steps with a railing? : Total 6 Click Score: 9    End of Session Equipment Utilized During Treatment: Gait belt Activity Tolerance: No increased pain;Patient limited by fatigue;Treatment limited secondary to medical complications (Comment) Patient left: in bed;with call bell/phone within reach Nurse Communication: Mobility status;Other (comment) (rehab goals) PT Visit Diagnosis: Other abnormalities of gait and mobility (  R26.89);Muscle weakness (generalized) (M62.81);Hemiplegia and hemiparesis Hemiplegia - Right/Left: Left Hemiplegia - dominant/non-dominant: Non-dominant    Time: 8127-5170 PT Time Calculation (min) (ACUTE ONLY): 33 min   Charges:   PT Evaluation $PT Eval Moderate Complexity: 1 Mod PT Treatments $Therapeutic Activity: 8-22 mins       Ramond Dial 09/12/2021, 8:09 PM  Mee Hives, PT PhD Acute Rehab Dept. Number: [place] [facility] [place]

## 2021-09-12 NOTE — Assessment & Plan Note (Addendum)
Unsure how far off baseline he is, but daughter thinks he is weaker, more lethargic than normal Protecting airway  -Lab work so far unremarkable, UA still pending. Does have foley. 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 Fever recorded by Ems, none here, f/u on UA. Will check CXR as well and blood cx.  -? If at baseline, or worsening from his SDH, this is stable to decreased in size though  -checking MRI to r/o CVA, does have history of history of seizures, checking Depakote level. ? eeg per neurology  -very significant decline in PO intake and daughter states he has hardly ate or drank over the past week, continue IVF and palliative care has been consulted.  -currently waiting for Palliative care

## 2021-09-12 NOTE — ED Notes (Signed)
PT at bedside.

## 2021-09-12 NOTE — Assessment & Plan Note (Addendum)
History of atrial fibrillation CHA2Ds2-vasc of 6 eliquis held in January due to SDH in January of this year, continue to hold. Unsure if he was on this as a note from his PCP in 05/2021 states the [place] told him to stop before this time. Family is unsure.  Has had multiple falls Hold metroprolol for permissive HTN

## 2021-09-12 NOTE — Assessment & Plan Note (Addendum)
S/p fall on 1/17 and admitted by neurosurgery for observation  eliquis stopped Started on depakote after possible seizure x2 and allergic reaction to keppra CTH shows stable SDH, slightly smaller overall decline since this fall  SCD/TED hose for VTE prophylaxis

## 2021-09-12 NOTE — Assessment & Plan Note (Addendum)
Failed trial without catheter on 08/29/21. Placed again by urology Continue foley, outpatient Urology follow up.

## 2021-09-12 NOTE — Code Documentation (Addendum)
Stroke Response Nurse Documentation Code Documentation  Brendan Long is a 88 y.o. male arriving to [facility] ED via [facility] on 09/12/21 with past medical hx of SDH, CVA, HLD, HTN, hypothyroidism. On No antithrombotic (had stopped Eliquis after recent fall with subdural hematoma). Code stroke was activated by [facility].   Patient from [facility] where he was LKW at 2200 09/11/21 and now complaining of altered LOC, lethargy, weakness, facial droop. According to staff, patient was LKW around 2200 last evening. When PT went in to work with pt this am he was not himself. He was weak on the left and lethargic. The facility called EMS who then activated the code stroke. EMS noted that patient had a chronic catheter for urinary retention and arrived with temp of 100.98F. Facility told them he had some burning with urination.   Stroke team at the bedside on patient arrival. Labs drawn and patient cleared for CT by Dr. Nicki Reaper. Patient to CT with team. NIHSS 9, see documentation for details and code stroke times. Patient with left facial droop, bilateral leg weakness, Expressive aphasia , and dysarthria  on exam. The following imaging was completed: CT, CTA head and neck. Patient is not a candidate for IV Thrombolytic due to outside window, recent SDH. Patient is not a candidate for IR due to no LVO per MD.   Care/Plan: q2x12 then q4 neuro checks/vitals.   Bedside handoff with ED RN Delana Meyer.    Satia Winger, Rande Brunt  Stroke Response RN

## 2021-09-12 NOTE — Assessment & Plan Note (Addendum)
Well controlled.  . Hold hctz with AKI.  Hold metoprolol to allow for permissive HTN

## 2021-09-12 NOTE — Consult Note (Addendum)
Neurology Consult  Brendan Long [facility] MR# [id number] 09/12/2021   CC: L side weakness  History is obtained from: patient and chart.  HPI: Brendan Long is a 88 y.o. male PMHx of HLD, HTN, stroke, vit D deficiency, afib not on eliquis (due to recent subdural hematoma) and recent admission (1/17) for mechanical fall c/b subdural hematoma c/b new onset seizure presents to [facility] due to L sided weakness and altered mental status. LKW was 10:00 last night. Patient reports he was feeling unwell this morning. PT this AM reported that patient appeared weak on the left and lethargic. EMS notes patient had chronic catheter for urinary retention with temp of 100.2.    LKW: 09/11/21 22:00 tNK given: No due to recent SDH and outside of window IR Thrombectomy No, no LVO Modified Rankin Scale: 4-Needs assistance to walk and tend to bodily needs NIHSS: 9 (LLE weakness: 3, RLE weakness: 3, left facial droop: 1, expressive aphasia: 1, dysarthria: 1)  ROS: A complete ROS was performed and is negative except as noted in the HPI. Unable to assess due to encephalopathy.  Past Medical History:  Diagnosis Date   ED (erectile dysfunction)    Hyperlipidemia    Hypertension    Hypothyroidism    Psoriasis    Stroke ([place])    Varicose veins    Vitamin D deficiency      Family History  Problem Relation Age of Onset   Breast cancer Other     Social History:  reports that he has quit smoking. He has never used smokeless tobacco. He reports that he does not drink alcohol and does not use drugs.   Prior to Admission medications   Medication Sig Start Date End Date Taking? Authorizing Provider  atorvastatin (LIPITOR) 80 MG tablet TAKE 1 TABLET DAILY Patient taking differently: Take 80 mg by mouth every morning. 07/24/17  Yes Renato Shin, MD  diclofenac sodium (VOLTAREN) 1 % GEL APPLY 4 GRAMS TOPICALLY FOUR TIMES A DAY AS NEEDED FOR KNEE PAIN Patient taking differently: Apply 4 g topically 4 (four) times  daily as needed (knee pain). 04/22/18  Yes Renato Shin, MD  divalproex (DEPAKOTE) 500 MG DR tablet Take 1 tablet (500 mg total) by mouth every 12 (twelve) hours. 08/31/21  Yes Viona Gilmore D, NP  finasteride (PROSCAR) 5 MG tablet Take 5 mg by mouth every morning.   Yes [provider]  hydrochlorothiazide (MICROZIDE) 12.5 MG capsule Take 12.5 mg by mouth every morning. 06/12/16  Yes [provider]  HYDROcodone-acetaminophen (NORCO/VICODIN) 5-325 MG tablet Take 1 tablet by mouth every 6 (six) hours as needed for moderate pain or severe pain. Patient taking differently: Take 1 tablet by mouth every 6 (six) hours as needed (knee pain). 08/30/21  Yes Viona Gilmore D, NP  ketoconazole (NIZORAL) 2 % cream Apply 1 application topically 2 (two) times daily as needed for irritation. Patient taking differently: Apply 1 application topically 2 (two) times daily as needed for irritation (psoriasis). 05/19/21  Yes Laurey Morale, MD  metoprolol tartrate (LOPRESSOR) 50 MG tablet Take 50 mg by mouth 2 (two) times daily.   Yes [provider]  sodium chloride (OCEAN) 0.65 % SOLN nasal spray Place 2 sprays into both nostrils every 6 (six) hours as needed for congestion.   Yes [provider]  tamsulosin (FLOMAX) 0.4 MG CAPS capsule Take 1 capsule (0.4 mg total) by mouth daily. Patient taking differently: Take 0.4 mg by mouth every morning. 08/25/21  Yes Viona Gilmore  D, NP  TRICOR 48 MG tablet TAKE 1 TABLET DAILY Patient taking differently: Take 48 mg by mouth every morning. 07/15/19  Yes Isaac Bliss, Rayford Halsted, MD    Exam: Current vital signs: BP 139/65 (BP Location: Right Arm)    Pulse 73    Temp 98.2 F (36.8 C) (Oral)    Resp 16    Ht 5' 10.5" (1.791 m)    Wt 70.9 kg    SpO2 95%    BMI 22.12 kg/m   Physical Exam  Constitutional: Appears well-developed and well-nourished.  Psych: Affect appropriate to situation Eyes: No scleral injection HENT: No OP  obstruction. Head: Normocephalic.  Cardiovascular: Normal rate and regular rhythm.  Respiratory: Effort normal, symmetric excursions bilaterally, no audible wheezing. GI: Soft.  No distension. There is no tenderness.  Skin: WDI  Neuro: Mental Status: Patient is awake, alert, oriented to person, place, month, year, and situation. Patient is able to give a limited history. Mild dysarthria. Mild aphasia: able to identify 2/3 objects Visual Fields are full. Pupils are equal, round, and reactive to light. EOMI without ptosis or diploplia.  Facial sensation is symmetric to temperature Mild left facial droop. Hearing is intact to voice. Uvula midline and palate elevates symmetrically. Shoulder shrug is symmetric. Tongue is midline without atrophy or fasciculations.  Tone is normal. Bulk is normal. 5/5 strength in LUE and RUE. No antigravity movements in RLE and LLE.  Sensation is symmetric to light touch and temperature in the arms and legs. Deep Tendon Reflexes: 2+ and symmetric in the biceps and patellae. Toes are downgoing bilaterally. FNF and HKS are intact bilaterally. Gait - Deferred  1a Level of Conscious.: 0 1b LOC Questions: 0 1c LOC Commands: 0 2 Best Gaze: 0 3 Visual: 0 4 Facial Palsy: 1 5a Motor Arm - left: 0 5b Motor Arm - Right: 0 6a Motor Leg - Left: 3 6b Motor Leg - Right: 3 7 Limb Ataxia: 0 8 Sensory: 0 9 Best Language: 1 10 Dysarthria: 1 11 Extinct. and Inatten.: 0 TOTAL: 9  I have reviewed labs in epic and the pertinent results are: CBC    Component Value Date/Time   WBC 14.6 (H) 09/12/2021 1202   RBC 5.12 09/12/2021 1202   HGB 16.0 09/12/2021 1202   HCT 48.0 09/12/2021 1202   PLT 132 (L) 09/12/2021 1202   MCV 93.8 09/12/2021 1202   MCH 31.3 09/12/2021 1202   MCHC 33.3 09/12/2021 1202   RDW 12.9 09/12/2021 1202   LYMPHSABS 1.2 09/12/2021 1202   MONOABS 1.3 (H) 09/12/2021 1202   EOSABS 0.2 09/12/2021 1202   BASOSABS 0.0 09/12/2021 1202   BMP  Latest Ref Rng & Units 09/12/2021 09/12/2021 08/30/2021  Glucose 70 - 99 mg/dL 130(H) 128(H) 100(H)  BUN 8 - 23 mg/dL 35(H) 35(H) 28(H)  Creatinine 0.61 - 1.24 mg/dL 2.06(H) 2.00(H) 1.63(H)  Sodium 135 - 145 mmol/L 132(L) 136 141  Potassium 3.5 - 5.1 mmol/L 3.7 3.8 3.8  Chloride 98 - 111 mmol/L 94(L) 99 103  CO2 22 - 32 mmol/L 26 - 26  Calcium 8.9 - 10.3 mg/dL 8.9 - 9.2    I have reviewed the images obtained: NCT head showed No acute ICH, ASPECTS 8. CTA head and neck showed severe stenosis L P2 PCA, severe stenoses of L vertebral artery, and R P2 PCA, severe stenosis R A2 ACA, severe multifocal bilateral M2 MCA stenosis, moderate R paraclinoid ICA and b/l M1 MCA stenosis. 60% stenosis of L carotid artery,  moderate stenosis of L intradural vertebral artery, moderate stenosis of L subclavian, ulcerated aortic atherosclerosis  Assessment: Brendan Long is a 88 y.o. male PMHx of HLD, HTN, stroke, vit D deficiency, and recent admission (1/17) for mechanical fall c/b subdural hematoma c/b new onset seizure presents to [facility] due to L sided weakness and altered mental status. Admitted for stroke workup. Not on antithrombotics due to recent subdural hematoma. Not on antiplatelet therapy either. On depakote 2/2 having rash while on keppra.   Impression:  Stroke vs TIA vs encephalopathy 2/2 UTI  Plan: - MRI brain without contrast. - Recommend TTE. - Recommend labs: HbA1c, lipid panel, TSH. - Recommend Statin if LDL > 70 - Aspirin 325 mg then 81mg  daily only due to h/o SDH for now. - Permissive hypertension first 24 h < 220/110.  - Telemetry monitoring for arrhythmia. - Recommend bedside Swallow screen. - Recommend Stroke education. - Recommend PT/OT/SLP consult. - Recommend metabolic/infectious workup with CBC, CMP, UA with UCx, CXR, CK, serum lactate.   Electronically signed by:  France Ravens, MD PGY1 Resident 09/12/2021, 2:37 PM  If 7pm- 7am, please page neurology on call as listed in  [place].  ATTENDING ATTESTATION:  Agree with above note. Independently evaluated pt NIHSS 9; however he was weak in both legs. Out of window for tPA. CTA with multiple stenosis. Stroke workup as outlined above. Stroke team to follow.  Dr. Reeves Forth evaluated pt independently, reviewed imaging, chart, labs. Discussed and formulated plan with the Resident. Please see Resident note above for details.      This patient is critically ill due to code stroke  and at significant risk of neurological worsening, death form heart failure, respiratory failure, recurrent stroke, bleeding from [facility], seizure, sepsis. This patient's care requires constant monitoring of vital signs, hemodynamics, respiratory and cardiac monitoring, review of multiple databases, neurological assessment, discussion with family, other specialists and medical decision making of high complexity. I spent 60 minutes of neurocritical care time in the care of this patient.   Daeshon Grammatico,MD

## 2021-09-12 NOTE — Assessment & Plan Note (Addendum)
Secondary to SDH Seizure precautions Depakote levels adequate.  eeg per neurology

## 2021-09-12 NOTE — Assessment & Plan Note (Addendum)
A1c 6.0 in 05/2021 On no oral medication Sensitive SSI while inpatient Liberalize diet to regular with hx of poor PO intake over last 3 weeks

## 2021-09-12 NOTE — Assessment & Plan Note (Signed)
-  started on eliquis at this time in 10/21, it appears it has since been discontinued with no anti-platelet therapy. [facility] note appears [facility] in fall of 2022. ? If due to falls  -has SDH, but when appropriate would start back antiplatelet  -continue statin

## 2021-09-13 ENCOUNTER — Inpatient Hospital Stay (HOSPITAL_COMMUNITY): Payer: Medicare Other

## 2021-09-13 ENCOUNTER — Observation Stay (HOSPITAL_COMMUNITY): Payer: Medicare Other

## 2021-09-13 DIAGNOSIS — Z711 Person with feared health complaint in whom no diagnosis is made: Secondary | ICD-10-CM | POA: Diagnosis not present

## 2021-09-13 DIAGNOSIS — S065X9D Traumatic subdural hemorrhage with loss of consciousness of unspecified duration, subsequent encounter: Secondary | ICD-10-CM | POA: Diagnosis not present

## 2021-09-13 DIAGNOSIS — N1832 Chronic kidney disease, stage 3b: Secondary | ICD-10-CM | POA: Diagnosis present

## 2021-09-13 DIAGNOSIS — I639 Cerebral infarction, unspecified: Secondary | ICD-10-CM | POA: Diagnosis not present

## 2021-09-13 DIAGNOSIS — I6522 Occlusion and stenosis of left carotid artery: Secondary | ICD-10-CM | POA: Diagnosis present

## 2021-09-13 DIAGNOSIS — Z960 Presence of urogenital implants: Secondary | ICD-10-CM | POA: Diagnosis not present

## 2021-09-13 DIAGNOSIS — R638 Other symptoms and signs concerning food and fluid intake: Secondary | ICD-10-CM | POA: Diagnosis not present

## 2021-09-13 DIAGNOSIS — G4089 Other seizures: Secondary | ICD-10-CM | POA: Diagnosis present

## 2021-09-13 DIAGNOSIS — E785 Hyperlipidemia, unspecified: Secondary | ICD-10-CM | POA: Diagnosis not present

## 2021-09-13 DIAGNOSIS — G9340 Encephalopathy, unspecified: Secondary | ICD-10-CM

## 2021-09-13 DIAGNOSIS — E039 Hypothyroidism, unspecified: Secondary | ICD-10-CM | POA: Diagnosis present

## 2021-09-13 DIAGNOSIS — R569 Unspecified convulsions: Secondary | ICD-10-CM

## 2021-09-13 DIAGNOSIS — R338 Other retention of urine: Secondary | ICD-10-CM

## 2021-09-13 DIAGNOSIS — E1122 Type 2 diabetes mellitus with diabetic chronic kidney disease: Secondary | ICD-10-CM

## 2021-09-13 DIAGNOSIS — I480 Paroxysmal atrial fibrillation: Secondary | ICD-10-CM

## 2021-09-13 DIAGNOSIS — I6381 Other cerebral infarction due to occlusion or stenosis of small artery: Secondary | ICD-10-CM | POA: Diagnosis present

## 2021-09-13 DIAGNOSIS — I69318 Other symptoms and signs involving cognitive functions following cerebral infarction: Secondary | ICD-10-CM | POA: Diagnosis not present

## 2021-09-13 DIAGNOSIS — Z515 Encounter for palliative care: Secondary | ICD-10-CM

## 2021-09-13 DIAGNOSIS — Z66 Do not resuscitate: Secondary | ICD-10-CM

## 2021-09-13 DIAGNOSIS — R0682 Tachypnea, not elsewhere classified: Secondary | ICD-10-CM | POA: Diagnosis not present

## 2021-09-13 DIAGNOSIS — R4701 Aphasia: Secondary | ICD-10-CM | POA: Diagnosis present

## 2021-09-13 DIAGNOSIS — R627 Adult failure to thrive: Secondary | ICD-10-CM

## 2021-09-13 DIAGNOSIS — G9341 Metabolic encephalopathy: Secondary | ICD-10-CM | POA: Diagnosis present

## 2021-09-13 DIAGNOSIS — N390 Urinary tract infection, site not specified: Secondary | ICD-10-CM | POA: Diagnosis present

## 2021-09-13 DIAGNOSIS — R471 Dysarthria and anarthria: Secondary | ICD-10-CM | POA: Diagnosis present

## 2021-09-13 DIAGNOSIS — R29810 Facial weakness: Secondary | ICD-10-CM | POA: Diagnosis present

## 2021-09-13 DIAGNOSIS — I129 Hypertensive chronic kidney disease with stage 1 through stage 4 chronic kidney disease, or unspecified chronic kidney disease: Secondary | ICD-10-CM | POA: Diagnosis present

## 2021-09-13 DIAGNOSIS — Z9181 History of falling: Secondary | ICD-10-CM | POA: Diagnosis not present

## 2021-09-13 DIAGNOSIS — N179 Acute kidney failure, unspecified: Secondary | ICD-10-CM

## 2021-09-13 DIAGNOSIS — M2550 Pain in unspecified joint: Secondary | ICD-10-CM | POA: Diagnosis not present

## 2021-09-13 DIAGNOSIS — Z8673 Personal history of transient ischemic attack (TIA), and cerebral infarction without residual deficits: Secondary | ICD-10-CM

## 2021-09-13 DIAGNOSIS — I63412 Cerebral infarction due to embolism of left middle cerebral artery: Secondary | ICD-10-CM | POA: Diagnosis not present

## 2021-09-13 DIAGNOSIS — E119 Type 2 diabetes mellitus without complications: Secondary | ICD-10-CM | POA: Diagnosis not present

## 2021-09-13 DIAGNOSIS — I6389 Other cerebral infarction: Secondary | ICD-10-CM

## 2021-09-13 DIAGNOSIS — S065XAA Traumatic subdural hemorrhage with loss of consciousness status unknown, initial encounter: Secondary | ICD-10-CM | POA: Diagnosis present

## 2021-09-13 DIAGNOSIS — Z7189 Other specified counseling: Secondary | ICD-10-CM

## 2021-09-13 DIAGNOSIS — N189 Chronic kidney disease, unspecified: Secondary | ICD-10-CM

## 2021-09-13 DIAGNOSIS — I10 Essential (primary) hypertension: Secondary | ICD-10-CM

## 2021-09-13 DIAGNOSIS — Z789 Other specified health status: Secondary | ICD-10-CM | POA: Diagnosis not present

## 2021-09-13 DIAGNOSIS — W1830XA Fall on same level, unspecified, initial encounter: Secondary | ICD-10-CM | POA: Diagnosis present

## 2021-09-13 DIAGNOSIS — B964 Proteus (mirabilis) (morganii) as the cause of diseases classified elsewhere: Secondary | ICD-10-CM | POA: Diagnosis present

## 2021-09-13 DIAGNOSIS — R29709 NIHSS score 9: Secondary | ICD-10-CM | POA: Diagnosis present

## 2021-09-13 DIAGNOSIS — R2990 Unspecified symptoms and signs involving the nervous system: Secondary | ICD-10-CM | POA: Diagnosis not present

## 2021-09-13 DIAGNOSIS — Z7401 Bed confinement status: Secondary | ICD-10-CM | POA: Diagnosis not present

## 2021-09-13 DIAGNOSIS — R339 Retention of urine, unspecified: Secondary | ICD-10-CM | POA: Diagnosis not present

## 2021-09-13 DIAGNOSIS — Z20822 Contact with and (suspected) exposure to covid-19: Secondary | ICD-10-CM | POA: Diagnosis present

## 2021-09-13 DIAGNOSIS — R531 Weakness: Secondary | ICD-10-CM | POA: Diagnosis present

## 2021-09-13 DIAGNOSIS — G8194 Hemiplegia, unspecified affecting left nondominant side: Secondary | ICD-10-CM | POA: Diagnosis present

## 2021-09-13 DIAGNOSIS — R296 Repeated falls: Secondary | ICD-10-CM | POA: Diagnosis present

## 2021-09-13 DIAGNOSIS — I6603 Occlusion and stenosis of bilateral middle cerebral arteries: Secondary | ICD-10-CM | POA: Diagnosis present

## 2021-09-13 LAB — ECHOCARDIOGRAM COMPLETE
AR max vel: 3.29 cm2
AV Area VTI: 3.02 cm2
AV Area mean vel: 3.05 cm2
AV Mean grad: 3 mmHg
AV Peak grad: 6.1 mmHg
Ao pk vel: 1.23 m/s
Area-P 1/2: 2.37 cm2
Calc EF: 45.6 %
Height: 70.5 in
MV VTI: 2.02 cm2
S' Lateral: 3.1 cm
Single Plane A2C EF: 49.3 %
Single Plane A4C EF: 43.8 %
Weight: 2502.4 oz

## 2021-09-13 LAB — LIPID PANEL
Cholesterol: 198 mg/dL (ref 0–200)
HDL: 23 mg/dL — ABNORMAL LOW (ref >40)
LDL Cholesterol: 158 mg/dL — ABNORMAL HIGH (ref 0–99)
Total CHOL/HDL Ratio: 8.6 RATIO
Triglycerides: 85 mg/dL (ref <150)
VLDL: 17 mg/dL (ref 0–40)

## 2021-09-13 LAB — BASIC METABOLIC PANEL
Anion gap: 8 (ref 5–15)
BUN: 37 mg/dL — ABNORMAL HIGH (ref 8–23)
CO2: 27 mmol/L (ref 22–32)
Calcium: 8.5 mg/dL — ABNORMAL LOW (ref 8.9–10.3)
Chloride: 100 mmol/L (ref 98–111)
Creatinine, Ser: 1.85 mg/dL — ABNORMAL HIGH (ref 0.61–1.24)
GFR, Estimated: 34 mL/min — ABNORMAL LOW (ref >60)
Glucose, Bld: 89 mg/dL (ref 70–99)
Potassium: 3.5 mmol/L (ref 3.5–5.1)
Sodium: 135 mmol/L (ref 135–145)

## 2021-09-13 LAB — CBC
HCT: 44.4 % (ref 39.0–52.0)
Hemoglobin: 14.9 g/dL (ref 13.0–17.0)
MCH: 30.9 pg (ref 26.0–34.0)
MCHC: 33.6 g/dL (ref 30.0–36.0)
MCV: 92.1 fL (ref 80.0–100.0)
Platelets: 114 10*3/uL — ABNORMAL LOW (ref 150–400)
RBC: 4.82 MIL/uL (ref 4.22–5.81)
RDW: 13 % (ref 11.5–15.5)
WBC: 15.3 10*3/uL — ABNORMAL HIGH (ref 4.0–10.5)
nRBC: 0 % (ref 0.0–0.2)

## 2021-09-13 LAB — GLUCOSE, CAPILLARY
Glucose-Capillary: 73 mg/dL (ref 70–99)
Glucose-Capillary: 91 mg/dL (ref 70–99)
Glucose-Capillary: 92 mg/dL (ref 70–99)
Glucose-Capillary: 96 mg/dL (ref 70–99)

## 2021-09-13 LAB — HEMOGLOBIN A1C
Hgb A1c MFr Bld: 5.6 % (ref 4.8–5.6)
Mean Plasma Glucose: 114.02 mg/dL

## 2021-09-13 MED ORDER — APIXABAN 2.5 MG PO TABS
2.5000 mg | ORAL_TABLET | Freq: Two times a day (BID) | ORAL | Status: DC
  Administered 2021-09-13 – 2021-09-14 (×2): 2.5 mg via ORAL
  Filled 2021-09-13 (×2): qty 1

## 2021-09-13 MED ORDER — PERFLUTREN LIPID MICROSPHERE
1.0000 mL | INTRAVENOUS | Status: AC | PRN
  Administered 2021-09-13: 2 mL via INTRAVENOUS
  Filled 2021-09-13: qty 10

## 2021-09-13 MED ORDER — CHLORHEXIDINE GLUCONATE CLOTH 2 % EX PADS
6.0000 | MEDICATED_PAD | Freq: Every day | CUTANEOUS | Status: DC
  Administered 2021-09-13 – 2021-09-14 (×2): 6 via TOPICAL

## 2021-09-13 NOTE — Progress Notes (Signed)
SLP Cancellation Note  Patient Details Name: Brendan Long MRN: [id number] DOB: 1929-04-28   Cancelled treatment:       Reason Eval/Treat Not Completed: SLP screened, no acute needs identified, will sign off. Pt seen on 1/24 in prior admission with overt cognitive impairment warranting SNF f/u. Expect pt to have ongoing cognitive needs as well as reassessment at next level of care to identify new needs given new CVA. No acute SLP assessment needed, SNF f/u recommended.    [name], Katherene Ponto 09/13/2021, 11:16 AM

## 2021-09-13 NOTE — Consult Note (Addendum)
Consultation Note Date: 09/13/2021   Patient Name: Brendan Long  DOB: 06-10-1929  MRN: [id number]  Age / Sex: 88 y.o., male  PCP: Isaac Bliss, Rayford Halsted, MD Referring Physician: Alma Friendly, MD  Reason for Consultation: Establishing goals of care, "decline since fall 08/23/21 with subdural hematoma. has hardly eaten or drank past 86 weeks. please make sure and call daughter, Brendan Long scott to make sure she is there or on phone. he is confused."  HPI/Patient Profile: 88 y.o. male  with  with past medical history of  HTN, HLD, CVA, PAF s/p ablation 15 years ago not on anticoagulation with recent SDH s/p fall presented to ED on 09/12/21 from [facility] rehab with staff concerns of his left sided weakness and AMS. Patient was recently hospitalized from 1/17-1/25/23 with subdural hematoma s/p fall. Patient has had a significant decline since this admission now with failure to thrive. Patient was admitted on 09/12/2021 with left sided weakness/stroke work up, acute encephalopathy, acute on chronic CKD, atrial fibrillation not on anticoagulation.   Clinical Assessment and Goals of Care: I have reviewed medical records including EPIC notes, labs, and imaging. Received report from primary RN - no acute concerns. RN reports patient is more awake today but still very lethargic with poor PO intake. Discussed patient with OT who worked with him this morning - reports patient is lethargic, max assist, can only sit on side of bed, confused. OT worked with patient during previous admission in January and states he has had a drastic decline since that time.    Went to visit patient at bedside - no family/visitors present. Patient was lying in bed asleep - I did not attempt to wake him. No signs or non-verbal gestures of pain or discomfort noted. No respiratory distress, increased work of breathing, or secretions noted.    Called daughter/Kay to discuss diagnosis, prognosis, GOC, EOL wishes, disposition, and options. Brendan Long is actually patient's step-daughter. She tells me patient is not legally married. He has two other biological children and they have minimal communication. Brendan Long is meeting with a lawyer this afternoon to gain clarity into POA documents.  I introduced Palliative Medicine as specialized medical care for people living with serious illness. It focuses on providing relief from the symptoms and stress of a serious illness. The goal is to improve quality of life for both the patient and the family.  We discussed a brief life review of the patient as well as functional and nutritional status. Patient is retired Nature conservation officer. He has had two previous marriages - his second marriage is when Brendan Long became his step daughter; unfortunately, [place] Hun mother/patient's second wife passed away in 10-18-05. Patient is not legally married to [place], who is listed at [place] in Emergency Contacts. Prior to hospitalization, patient was at [facility] for rehab. Before that/previous hospitalization in January, patient was living at home alone in a private residence. Brendan Long states patient has had a drastic decline since admission in January/discharge to rehab. He has had poor oral intake. He would ambulate up  and down the hall with assistance, but otherwise was not active.  We discussed patient's current illness and what it means in the larger context of patient's on-going co-morbidities.  Natural disease trajectory and expectations at EOL were discussed. I attempted to elicit values and goals of care important to the patient. The difference between aggressive medical intervention and comfort care was considered in light of the patient's goals of care. Brendan Long states "if he cannot live on his own and drive, he would not want to be here. He was very social and independence was important to him." Brendan Long understands patient will likely continue needing 24/7  supervision, likely at LTC/memory care because he is no longer able to live alone. She knows the patient would not want to live this way. She "wants him to get the best care he can" but she "can't care for him at home."  Provided education and counseling at length on the philosophy and benefits of hospice care. Discussed that it offers a holistic approach to care in the setting of end-stage illness, and is about supporting the patient where they are allowing nature to take it's course. Discussed the hospice team includes RNs, physicians, social workers, and chaplains. They can provide personal care, support for the family, and help keep patient out of the hospital as well as assist with DME needs for home hospice. Education provided on the difference between home vs residential hospice. Brendan Long is interested in residential hospice evaluation at [facility]. Pending their decision on eligibility, she will make stepwise decisions from there. She would consider LTC with hospice vs him returning to rehab if not accepted at [facility].  Advance directives, concepts specific to code status, artificial feeding and hydration, and rehospitalization were considered and discussed. Patient does have a HCPOA listing that person at [facility]; however, Brendan Long questions if this document is legal. She and Brendan Long are meeting with attorney today to discuss. Will hold off on sending hospice referral until after they meet with attorney - Brendan Long to call PMT back and provide updates.  Discussed with family the importance of continued conversation with each other and the medical providers regarding overall plan of care and treatment options, ensuring decisions are within the context of the patients values and GOCs.    Questions and concerns were addressed. The patient/family was encouraged to call with questions and/or concerns. PMT card was provided.  4:00 PM  Received notification Brendan Long called PMT phone with request to return  call.  Sheffield Slider - she and patient's SO/Alta met with lawyer this afternoon. Patient's HCPOA document is legal naming [place] as HCPOA. However, Brendan Long defers all medical decisions to [place]. Brendan Long would like to move forward with [facility] evaluation. Education provided on options if he's not accepted to include return to rehab vs LTC with home hospice. Brendan Long will see if patient is approved for [facility], if not, she is interested in LTC with hospice.  4:20 PM Attempted to call Brendan Long to verify she defers medical decisions to [facility] - no answer - confidential voicemail left and PMT phone number provided with request to return call.  Per Celso Sickle during previous admission, Brendan Long deferred medical decisions to [place]. I feel comfortable at least getting hospice referral started before speaking with [facility].  5:15 PM Received call back from [place] - she is clear that Brendan Long is able to make all medical decisions for patient going forward. Updates provided to her about patient's condition per her request. Discussion on the difference  between Palliative and Hospice care provided per her request. Palliative care and hospice have similar goals of managing symptoms, promoting comfort, improving quality of life, and maintaining a person's dignity. However, palliative care may be offered during any phase of a serious illness, while hospice care is usually offered when a person is expected to live for 6 months or less.   All questions and concerns addressed. Encouraged to call with questions and/or concerns. PMT number provided.  5:21 PM Attempted to call Brendan Long to discuss transition to comfort measures in house now that Brendan Long has deferred decisions to her - no answer - confidential voicemail left and PMT phone number provided with request to return call. Will reach out to her again tomorrow if she does not return call   Primary Decision Maker: HCPOA is [employer] - she defers medical decisions to [place] current gentle medical treatment for now [place] defers all patient's medical decision making to step-daughter/Kay Nicki Reaper Continue DNR/DNI as previously documented Kay's goal is for patient transfer to [facility] - evaluation pending TOC notified and consult placed for: [facility] referral If patient is not accepted to [facility], goal is LTC with hospice PMT will continue to follow and support holistically  Code Status/Advance Care Planning: DNR   Palliative Prophylaxis:  Aspiration, Bowel Regimen, Frequent Pain Assessment, Oral Care, and Turn Reposition  Additional Recommendations (Limitations, Scope, Preferences): Avoid Hospitalization, No Artificial Feeding, and No Tracheostomy  Psycho-social/Spiritual:  Created space and opportunity for patient and family to express thoughts and feelings regarding patient's current medical situation.  Emotional support and therapeutic listening provided.  Prognosis:  Poor in the setting of advanced age, recent ischemic infarct, failure to thrive, minimal oral intake, and multiple comorbidities  Discharge Planning: To Be Determined      Primary Diagnoses: Present on Admission:  Acute kidney injury superimposed on CKD (HCC)  AF (paroxysmal atrial fibrillation) (HCC)  (Resolved) Acute CVA (cerebrovascular accident) ([place])  DM (diabetes mellitus), type 2 with renal complications ([place])  Dyslipidemia  Essential hypertension  (Resolved) Hypothyroidism  (Resolved) PSORIASIS  Subdural hematoma  Acute encephalopathy  Acute urinary retention   I have reviewed the medical record, interviewed the patient and family, and examined the patient. The following aspects are pertinent.  Past Medical History:  Diagnosis Date   ED (erectile dysfunction)    Hyperlipidemia    Hypertension    Hypothyroidism    Psoriasis    Stroke ([place])    Varicose veins    Vitamin D deficiency    Social History    Socioeconomic History   Marital status: Widowed    Spouse name: Not on file   Number of children: Not on file   Years of education: Not on file   Highest education level: Not on file  Occupational History   Not on file  Tobacco Use   Smoking status: Former   Smokeless tobacco: Never  Vaping Use   Vaping Use: Never used  Substance and Sexual Activity   Alcohol use: No   Drug use: No   Sexual activity: Yes  Other Topics Concern   Not on file  Social History Narrative   Not on file   Social Determinants of Health   Financial Resource Strain: Not on file  Food Insecurity: Not on file  Transportation Needs: Not on file  Physical Activity: Not on file  Stress: Not on file  Social Connections: Not  on file   Family History  Problem Relation Age of Onset   Breast cancer Other    Scheduled Meds:  aspirin  81 mg Oral Daily   atorvastatin  80 mg Oral q morning   Chlorhexidine Gluconate Cloth  6 each Topical Daily   divalproex  500 mg Oral Q12H   fenofibrate  54 mg Oral Daily   finasteride  5 mg Oral q morning   insulin aspart  0-9 Units Subcutaneous TID WC   tamsulosin  0.4 mg Oral q morning   Continuous Infusions:  sodium chloride 75 mL/hr at 09/13/21 1112   cefTRIAXone (ROCEPHIN)  IV Stopped (09/12/21 2230)   PRN Meds:.acetaminophen **OR** acetaminophen, diclofenac Sodium, perflutren lipid microspheres (DEFINITY) IV suspension Medications Prior to Admission:  Prior to Admission medications   Medication Sig Start Date End Date Taking? Authorizing Provider  atorvastatin (LIPITOR) 80 MG tablet TAKE 1 TABLET DAILY Patient taking differently: Take 80 mg by mouth every morning. 07/24/17  Yes Renato Shin, MD  diclofenac sodium (VOLTAREN) 1 % GEL APPLY 4 GRAMS TOPICALLY FOUR TIMES A DAY AS NEEDED FOR KNEE PAIN Patient taking differently: Apply 4 g topically 4 (four) times daily as needed (knee pain). 04/22/18  Yes Renato Shin, MD  divalproex (DEPAKOTE) 500 MG DR tablet  Take 1 tablet (500 mg total) by mouth every 12 (twelve) hours. 08/31/21  Yes Viona Gilmore D, NP  finasteride (PROSCAR) 5 MG tablet Take 5 mg by mouth every morning.   Yes [provider]  hydrochlorothiazide (MICROZIDE) 12.5 MG capsule Take 12.5 mg by mouth every morning. 06/12/16  Yes [provider]  HYDROcodone-acetaminophen (NORCO/VICODIN) 5-325 MG tablet Take 1 tablet by mouth every 6 (six) hours as needed for moderate pain or severe pain. Patient taking differently: Take 1 tablet by mouth every 6 (six) hours as needed (knee pain). 08/30/21  Yes Viona Gilmore D, NP  ketoconazole (NIZORAL) 2 % cream Apply 1 application topically 2 (two) times daily as needed for irritation. Patient taking differently: Apply 1 application topically 2 (two) times daily as needed for irritation (psoriasis). 05/19/21  Yes Laurey Morale, MD  metoprolol tartrate (LOPRESSOR) 50 MG tablet Take 50 mg by mouth 2 (two) times daily.   Yes [provider]  sodium chloride (OCEAN) 0.65 % SOLN nasal spray Place 2 sprays into both nostrils every 6 (six) hours as needed for congestion.   Yes [provider]  tamsulosin (FLOMAX) 0.4 MG CAPS capsule Take 1 capsule (0.4 mg total) by mouth daily. Patient taking differently: Take 0.4 mg by mouth every morning. 08/25/21  Yes Bergman, Meghan D, NP  TRICOR 48 MG tablet TAKE 1 TABLET DAILY Patient taking differently: Take 48 mg by mouth every morning. 07/15/19  Yes Isaac Bliss, Rayford Halsted, MD   No Known Allergies Review of Systems  Unable to perform ROS: Acuity of condition   Physical Exam Vitals and nursing note reviewed.  Constitutional:      General: He is not in acute distress.    Appearance: He is ill-appearing.  Pulmonary:     Effort: No respiratory distress.  Skin:    General: Skin is warm and dry.  Neurological:     Mental Status: He is lethargic.     Motor: Weakness present.    Vital Signs: BP 133/74 (BP Location: Right Arm)     Pulse 69    Temp 98.3 F (36.8 C) (Oral)    Resp 16    Ht 5' 10.5" (1.791  m)    Wt 70.9 kg    SpO2 98%    BMI 22.12 kg/m  Pain Scale: 0-10   Pain Score: 0-No pain   SpO2: SpO2: 98 % O2 Device:SpO2: 98 % O2 Flow Rate: .   IO: Intake/output summary:  Intake/Output Summary (Last 24 hours) at 09/13/2021 1121 Last data filed at 09/13/2021 8004 Gross per 24 hour  Intake 787.65 ml  Output 1000 ml  Net -212.35 ml    LBM: Last BM Date: 09/12/21 Baseline Weight: Weight: 70.9 kg Most recent weight: Weight: 70.9 kg     Palliative Assessment/Data: PPS 20%     Time In/Out: 1120-1230/1600-1630/1715-1730 Time Total: 115 minutes  Greater than 50%  of this time was spent counseling and coordinating care related to the above assessment and plan.  Signed by: Lin Landsman, NP   Please contact Palliative Medicine Team phone at [phone] for questions and concerns.  For individual provider: See Shea Evans

## 2021-09-13 NOTE — Procedures (Signed)
This is a routine inpatient EEG performed on: September 13, 2021  Clinical indication: Altered mental status   History: The patient is a 88 year old male with a history of hypertension hyperlipidemia stroke and atrial fibrillation.  The patient presents with a recent subdural hematoma and fall with altered mental status. Introduction: This is a routine inpatient EEG performed using the standard 10-20 system of electrode placement with 21 channels of EEG and a single channel of EKG monitoring.  Approximately  22:34 minutes of EEG captured.  Photic stimulation and hyperventilation were not performed.   Description of the record:  The awake background is continuous and symmetric characterized by  an irregular 8-9 hertz posterior dominant rhythm.  Stage II sleep contained bisynchronous sleep spindles and K complexes.  No seizures, epileptiform discharges, or evolving ictal patterns seen.    The single-channel EKG shows a heart rate in the 80 BPM   range   Impression: Normal awake and asleep routine EEG. No seizures, epileptiform discharges, or evolving ictal patterns seen

## 2021-09-13 NOTE — Progress Notes (Signed)
EEG complete - results pending 

## 2021-09-13 NOTE — Progress Notes (Addendum)
PROGRESS NOTE  Brendan Long EVO:[id number] DOB: [birth year] DOA: 09/12/2021 PCP: Isaac Bliss, Rayford Halsted, MD   HPI/Recap of past 24 hours: Brendan Long is a 88 y.o. male with medical history significant of HTN, HLD, hx of CVA, PAF s/p ablation 15 years ago not on anticoagulation with recent SDH, recent subdural hematoma s/p fall. History is from his daughter/chart, due to somnolent state. During PT session in SNF, he was noticed to be weaker, more lethargic, confused with a left facial droop and left sided weakness, with some slurred speech. His daughter states since his fall with his brain bleed in January he has not been the same. He will sometimes answer questions appropriately and other times seem confused. He has also had poor PO intake over the last 3 weeks and daughter states over last week has hardly ate or drank anything. Recent hospitalization 1/17-1/25/23 for fall and subdural hematoma, Eliquis held, started on Depakote due to possible seizure.  Hospitalization complicated by urinary retention and has had a Foley catheter since then.  In the ED, MRI brain showed acute to early subacute ischemic nonhemorrhagic infarct involving the right corona radiata.  Neurology consulted.  Patient admitted for further management.     Today, patient alert, sleepy, able to follow commands, oriented to name, but not to place.    Assessment/Plan: Principal Problem:   Left-sided weakness Active Problems:   Dyslipidemia   Essential hypertension   History of CVA (cerebrovascular accident) in 2021   AF (paroxysmal atrial fibrillation) (HCC)   DM (diabetes mellitus), type 2 with renal complications (HCC)   Acute kidney injury superimposed on CKD ([place])   Subdural hematoma   Acute encephalopathy   Seizures (HCC)   Acute urinary retention   Acute to early subacute ischemic nonhemorrhagic infarct involving the right corona radiata History of remote CVA in 2021 Presented with L sided  weakness, slurred speech and left facial droop with confusion MRI brain showed above CT head showed no acute hemorrhage, decreased hyperdense hemorrhage CTA head/neck showed 60% stenosis of left carotid origin, no acute changes Echo showed EF of 55 to 60%, no regional wall motion abnormality, grade 1 diastolic dysfunction, no atrial level shunts detected LDL 158, A1c 5.6 Neurology consulted Neurology Dr. Erlinda Hong restarting Eliquis due to ischemic infarct (discussed with him, if patient going towards hospice route, will need to DC, otherwise monitor closely while on Eliquis) PT/OT/SLP Neurochecks, telemetry  Proteus mirabilis UTI Currently afebrile (per EMS fever on 100.2), with leukocytosis UA with positive nitrites, moderate leukocytes, large hemoglobin, many bacteria, 21-50 WBC UCx growing >100,000 Proteus mirabilis BC x2 pending Continue IV ceftriaxone Monitor closely  Acute metabolic encephalopathy- (present on admission) Ongoing, likely 2/2 CVA in addition to UTI Chest x-ray unremarkable EEG pending Management as above   Acute kidney injury superimposed on CKD 3b ([place])- (present on admission) Baseline creatinine: 1.6-1.7 Continue gentle hydration Continue Foley Due to history of CKD stage IIIb, HCTZ should be discontinued in this 88 year old Daily BMP   AF (paroxysmal atrial fibrillation) ([place])- (present on admission) CHA2Ds2-vasc of 6 Rate controlled, on metoprolol, currently held due to permissive hypertension  AC was discontinued due to recent SDH and frequent falls Continue Eliquis as per neurology as mentioned above Continue to hold metoprolol   Subdural hematoma- (present on admission) S/p fall on 1/17 and admitted by neurosurgery for observation  Started on depakote after possible seizure x2 and allergic reaction to keppra Eliquis has been restarted by neurology [facility] shows stable SDH,  slightly smaller Monitor closely  Essential hypertension- (present on  admission) Stable Please discontinue HCTZ upon discharge in this elderly patient with CKD stage IIIb, with frequent falls Hold metoprolol to allow for permissive HTN   DM (diabetes mellitus), type 2 with renal complications ([place])- (present on admission) A1c 5.6 Not on any medication Sensitive SSI while inpatient, Accu-Cheks, hypoglycemic protocol   Dyslipidemia- (present on admission) LDL 158 Continue Lipitor and tricor    Seizures ([place]) Secondary to SDH EEG pending Seizure precautions  Acute urinary retention- (present on admission) Failed voiding trial on 08/29/21 Foley placed again by urology Continue foley, urology outpatient    [place] discussion Due to significant decline, family wants to look into hospice Palliative care consulted       Estimated body mass index is 22.12 kg/m as calculated from the following:   Height as of this encounter: 5' 10.5" (1.791 m).   Weight as of this encounter: 70.9 kg.     Code Status: DNR  Family Communication: None at bedside  Disposition Plan: Status is: Observation The patient will require care spanning > 2 midnights and should be moved to inpatient because: Level of care   Consultants: Neurology  Procedures: None  Antimicrobials: Ceftriaxone  DVT prophylaxis: Eliquis     Objective: Vitals:   09/12/21 2345 09/13/21 0408 09/13/21 0734 09/13/21 1129  BP: 123/67 117/65 133/74 (!) 143/71  Pulse: 66 72 69 80  Resp: 17 17 16 16   Temp: 98 F (36.7 C) 99 F (37.2 C) 98.3 F (36.8 C)   TempSrc: Oral Oral Oral   SpO2: 95% 95% 98% 96%  Weight:      Height:        Intake/Output Summary (Last 24 hours) at 09/13/2021 1502 Last data filed at 09/13/2021 5638 Gross per 24 hour  Intake 787.65 ml  Output 1000 ml  Net -212.35 ml   Filed Weights   09/12/21 1229  Weight: 70.9 kg    Exam: General: NAD, appears lethargic/deconditioned, oriented to self only Cardiovascular: S1, S2 present Respiratory: CTAB Abdomen:  Soft, nontender, nondistended, bowel sounds present Musculoskeletal: No bilateral pedal edema noted Skin: Normal Psychiatry: Normal mood  Neurology: No obvious focal neurologic deficits, strength equal in all extremities    Data Reviewed: CBC: Recent Labs  Lab 09/12/21 1157 09/12/21 1202 09/13/21 0301  WBC  --  14.6* 15.3*  NEUTROABS  --  11.8*  --   HGB 16.7 16.0 14.9  HCT 49.0 48.0 44.4  MCV  --  93.8 92.1  PLT  --  132* 756*   Basic Metabolic Panel: Recent Labs  Lab 09/12/21 1157 09/12/21 1202 09/13/21 0301  NA 136 132* 135  K 3.8 3.7 3.5  CL 99 94* 100  CO2  --  26 27  GLUCOSE 128* 130* 89  BUN 35* 35* 37*  CREATININE 2.00* 2.06* 1.85*  CALCIUM  --  8.9 8.5*   GFR: Estimated Creatinine Clearance: 26.6 mL/min (A) (by C-G formula based on SCr of 1.85 mg/dL (H)). Liver Function Tests: Recent Labs  Lab 09/12/21 1202  AST 30  ALT 27  ALKPHOS 71  BILITOT 1.0  PROT 6.4*  ALBUMIN 3.0*   No results for input(s): LIPASE, AMYLASE in the last 168 hours. Recent Labs  Lab 09/12/21 1441  AMMONIA 22   Coagulation Profile: Recent Labs  Lab 09/12/21 1202  INR 1.2   Cardiac Enzymes: No results for input(s): CKTOTAL, CKMB, CKMBINDEX, TROPONINI in the last 168 hours. BNP (last 3 results)  No results for input(s): PROBNP in the last 8760 hours. HbA1C: Recent Labs    09/13/21 0301  HGBA1C 5.6   CBG: Recent Labs  Lab 09/12/21 1152 09/12/21 1605 09/12/21 2256 09/13/21 0757 09/13/21 1126  GLUCAP 134* 119* 124* 73 91   Lipid Profile: Recent Labs    09/13/21 0301  CHOL 198  HDL 23*  LDLCALC 158*  TRIG 85  CHOLHDL 8.6   Thyroid Function Tests: Recent Labs    09/12/21 1441  TSH 1.102   Anemia Panel: Recent Labs    09/12/21 1441  VITAMINB12 548   Urine analysis:    Component Value Date/Time   COLORURINE YELLOW 09/12/2021 1559   APPEARANCEUR CLOUDY (A) 09/12/2021 1559   LABSPEC <1.005 (L) 09/12/2021 1559   PHURINE 7.5 09/12/2021 1559    GLUCOSEU NEGATIVE 09/12/2021 1559   GLUCOSEU NEGATIVE 05/19/2021 1141   HGBUR LARGE (A) 09/12/2021 1559   HGBUR negative 11/09/2009 0000   BILIRUBINUR NEGATIVE 09/12/2021 1559   BILIRUBINUR n 11/15/2010 1151   KETONESUR NEGATIVE 09/12/2021 1559   PROTEINUR 100 (A) 09/12/2021 1559   UROBILINOGEN 1.0 05/19/2021 1141   NITRITE POSITIVE (A) 09/12/2021 1559   LEUKOCYTESUR MODERATE (A) 09/12/2021 1559   Sepsis Labs: @LABRCNTIP (procalcitonin:4,lacticidven:4)  ) Recent Results (from the past 240 hour(s))  Urine Culture     Status: Abnormal (Preliminary result)   Collection Time: 09/12/21 11:48 AM   Specimen: Urine, Clean Catch  Result Value Ref Range Status   Specimen Description URINE, CLEAN CATCH  Final   Special Requests NONE  Final   Culture (A)  Final    >=100,000 COLONIES/mL PROTEUS MIRABILIS SUSCEPTIBILITIES TO FOLLOW Performed at [facility], [address], [place], [place] [address]    Report Status PENDING  Incomplete  Resp Panel by RT-PCR (Flu A&B, Covid) Nasopharyngeal Swab     Status: None   Collection Time: 09/12/21 11:59 AM   Specimen: Nasopharyngeal Swab; Nasopharyngeal(NP) swabs in vial transport medium  Result Value Ref Range Status   SARS Coronavirus 2 by RT PCR NEGATIVE NEGATIVE Final    Comment: (NOTE) SARS-CoV-2 target nucleic acids are NOT DETECTED.  The SARS-CoV-2 RNA is generally detectable in upper respiratory specimens during the acute phase of infection. The lowest concentration of SARS-CoV-2 viral copies this assay can detect is 138 copies/mL. A negative result does not preclude SARS-Cov-2 infection and should not be used as the sole basis for treatment or other patient management decisions. A negative result may occur with  improper specimen collection/handling, submission of specimen other than nasopharyngeal swab, presence of viral mutation(s) within the areas targeted by this assay, and inadequate number of viral copies(<138 copies/mL). A  negative result must be combined with clinical observations, patient history, and epidemiological information. The expected result is Negative.  Fact Sheet for Patients:  EntrepreneurPulse.com.au  Fact Sheet for Healthcare Providers:  IncredibleEmployment.be  This test is no t yet approved or cleared by the [place] FDA and  has been authorized for detection and/or diagnosis of SARS-CoV-2 by FDA under an Emergency Use Authorization (EUA). This EUA will remain  in effect (meaning this test can be used) for the duration of the COVID-19 declaration under Section 564(b)(1) of the Act, 21 U.S.C.section 360bbb-3(b)(1), unless the authorization is terminated  or revoked sooner.       Influenza A by PCR NEGATIVE NEGATIVE Final   Influenza B by PCR NEGATIVE NEGATIVE Final    Comment: (NOTE) The Xpert Xpress SARS-CoV-2/FLU/RSV plus assay is intended as  an aid in the diagnosis of influenza from Nasopharyngeal swab specimens and should not be used as a sole basis for treatment. Nasal washings and aspirates are unacceptable for Xpert Xpress SARS-CoV-2/FLU/RSV testing.  Fact Sheet for Patients: EntrepreneurPulse.com.au  Fact Sheet for Healthcare Providers: IncredibleEmployment.be  This test is not yet approved or cleared by the [place] FDA and has been authorized for detection and/or diagnosis of SARS-CoV-2 by FDA under an Emergency Use Authorization (EUA). This EUA will remain in effect (meaning this test can be used) for the duration of the COVID-19 declaration under Section 564(b)(1) of the Act, 21 U.S.C. section 360bbb-3(b)(1), unless the authorization is terminated or revoked.  Performed at [facility], [place] [address], [place], [place] [address]   Culture, blood (routine x 2)     Status: None (Preliminary result)   Collection Time: 09/12/21  7:59 PM   Specimen: BLOOD  Result Value Ref Range  Status   Specimen Description BLOOD SITE NOT SPECIFIED  Final   Special Requests   Final    BOTTLES DRAWN AEROBIC AND ANAEROBIC Blood Culture adequate volume   Culture   Final    NO GROWTH < 12 HOURS Performed at [facility], [place] [address], [place], [place] [address]    Report Status PENDING  Incomplete  Culture, blood (routine x 2)     Status: None (Preliminary result)   Collection Time: 09/12/21  8:00 PM   Specimen: BLOOD  Result Value Ref Range Status   Specimen Description BLOOD SITE NOT SPECIFIED  Final   Special Requests   Final    BOTTLES DRAWN AEROBIC AND ANAEROBIC Blood Culture adequate volume   Culture   Final    NO GROWTH < 12 HOURS Performed at [facility], [place] [address] [place], [place] [address]    Report Status PENDING  Incomplete      Studies: MR BRAIN WO CONTRAST  Result Date: 09/12/2021 CLINICAL DATA:  Initial evaluation for neuro deficit, stroke. EXAM: MRI HEAD WITHOUT CONTRAST TECHNIQUE: Multiplanar, multiecho pulse sequences of the brain and surrounding structures were obtained without intravenous contrast. COMPARISON:  Prior CTs from earlier the same day. FINDINGS: Brain: Examination moderately to severely degraded by motion artifact. Diffuse prominence of the CSF containing spaces compatible generalized cerebral atrophy. Mild chronic microvascular ischemic disease noted involving the periventricular white matter. Few scattered remote lacunar infarcts noted about the bilateral basal ganglia/corona radiata. Remote lacunar infarct present at the right pons. Encephalomalacia and gliosis involving the right occipital lobe compatible with a chronic right PCA distribution infarct. Few additional scattered bilateral cerebellar infarcts noted, right greater than left. Patchy diffusion signal abnormality involving the right corona radiata measures approximately 2 cm in size, consistent with an acute to early subacute ischemic infarct (series 3, image 33). No  associated hemorrhage or mass effect. No other evidence for acute or subacute ischemia. Gray-white matter differentiation otherwise maintained. No mass lesion. Ventricles normal size without hydrocephalus. Known subacute left subdural hematoma again seen, grossly stable measuring up to approximately 5-6 mm without significant mass effect or midline shift. Basilar cisterns remain patent. Vascular: Major intracranial vascular flow voids are maintained. Skull and upper cervical spine: Craniocervical junction grossly within normal limits on this motion degraded exam. Bone marrow signal intensity grossly normal. No scalp soft tissue abnormality. Sinuses/Orbits: Prior ocular lens replacement on the left. Right gaze noted. Chronic mucosal thickening noted throughout the ethmoidal air cells and maxillary sinuses. No significant mastoid effusion. Other: None.  IMPRESSION: 1. Motion degraded exam. 2. 2 cm acute to early subacute ischemic nonhemorrhagic infarct involving the right corona radiata. 3. Chronic right PCA distribution infarct, with additional scattered remote lacunar infarcts involving the bilateral basal ganglia/corona radiata, right pons, and bilateral cerebellar hemispheres. 4. Stable subacute left subdural hematoma measuring up to 5-6 mm without significant mass effect or midline shift. Electronically Signed   By: Jeannine Boga M.D.   On: 09/12/2021 20:52   ECHOCARDIOGRAM COMPLETE  Result Date: 09/13/2021    ECHOCARDIOGRAM REPORT   Patient Name:   Brendan Long Date of Exam: 09/13/2021 Medical Rec #:  [id number]          Height:       70.5 in Accession #:    [id number]         Weight:       156.4 lb Date of Birth:  1928-11-10          BSA:          1.890 m Patient Age:    68 years           BP:           133/74 mmHg Patient Gender: M                  HR:           74 bpm. Exam Location:  Inpatient Procedure: 2D Echo, Cardiac Doppler, Color Doppler and Intracardiac            Opacification Agent  Indications:    CVA  History:        Patient has prior history of Echocardiogram examinations, most                 recent 05/07/2020. Stroke; Risk Factors:Dyslipidemia and                 Hypertension.  Sonographer:    Luisa Hart RDCS Referring Phys: [id number] Cornelius Moras XU IMPRESSIONS  1. Left ventricular ejection fraction, by estimation, is 55 to 60%. The left ventricle has normal function. The left ventricle has no regional wall motion abnormalities. There is mild concentric left ventricular hypertrophy. Left ventricular diastolic parameters are consistent with Grade I diastolic dysfunction (impaired relaxation).  2. Right ventricular systolic function is normal. The right ventricular size is normal. There is normal pulmonary artery systolic pressure.  3. Left atrial size was mildly dilated.  4. The mitral valve is grossly normal. No evidence of mitral valve regurgitation. Moderate mitral annular calcification.  5. The aortic valve was not well visualized. Aortic valve regurgitation is not visualized. Aortic valve sclerosis is present, with no evidence of aortic valve stenosis.  6. The inferior vena cava is dilated in size with >50% respiratory variability, suggesting right atrial pressure of 8 mmHg. FINDINGS  Left Ventricle: Left ventricular ejection fraction, by estimation, is 55 to 60%. The left ventricle has normal function. The left ventricle has no regional wall motion abnormalities. The left ventricular internal cavity size was normal in size. There is  mild concentric left ventricular hypertrophy. Left ventricular diastolic parameters are consistent with Grade I diastolic dysfunction (impaired relaxation). Indeterminate filling pressures. Right Ventricle: The right ventricular size is normal. No increase in right ventricular wall thickness. Right ventricular systolic function is normal. There is normal pulmonary artery systolic pressure. The tricuspid regurgitant velocity is 1.98 m/s, and  with an assumed  right atrial pressure of 8 mmHg, the estimated right ventricular systolic pressure is 42.7 mmHg.  Left Atrium: Left atrial size was mildly dilated. Right Atrium: Right atrial size was normal in size. Pericardium: There is no evidence of pericardial effusion. Mitral Valve: The mitral valve is grossly normal. Moderate mitral annular calcification. No evidence of mitral valve regurgitation. MV peak gradient, 3.6 mmHg. The mean mitral valve gradient is 2.0 mmHg. Tricuspid Valve: The tricuspid valve is normal in structure. Tricuspid valve regurgitation is trivial. Aortic Valve: The aortic valve was not well visualized. Aortic valve regurgitation is not visualized. Aortic valve sclerosis is present, with no evidence of aortic valve stenosis. Aortic valve mean gradient measures 3.0 mmHg. Aortic valve peak gradient measures 6.1 mmHg. Aortic valve area, by VTI measures 3.02 cm. Pulmonic Valve: The pulmonic valve was not well visualized. Pulmonic valve regurgitation is not visualized. Aorta: The aortic root and ascending aorta are structurally normal, with no evidence of dilitation. Venous: IVC assessment for right atrial pressure unable to be performed due to mechanical ventilation. The inferior vena cava is dilated in size with greater than 50% respiratory variability, suggesting right atrial pressure of 8 mmHg. IAS/Shunts: No atrial level shunt detected by color flow Doppler.  LEFT VENTRICLE PLAX 2D LVIDd:         4.50 cm     Diastology LVIDs:         3.10 cm     LV e' medial:    4.56 cm/s LV PW:         1.20 cm     LV E/e' medial:  17.1 LV IVS:        1.10 cm     LV e' lateral:   7.95 cm/s LVOT diam:     2.40 cm     LV E/e' lateral: 9.8 LV SV:         64 LV SV Index:   34 LVOT Area:     4.52 cm  LV Volumes (MOD) LV vol d, MOD A2C: 48.1 ml LV vol d, MOD A4C: 85.8 ml LV vol s, MOD A2C: 24.4 ml LV vol s, MOD A4C: 48.2 ml LV SV MOD A2C:     23.7 ml LV SV MOD A4C:     85.8 ml LV SV MOD BP:      29.4 ml RIGHT VENTRICLE RV  Basal diam:  2.80 cm RV Mid diam:    2.50 cm RV S prime:     11.80 cm/s LEFT ATRIUM           Index        RIGHT ATRIUM           Index LA diam:      3.70 cm 1.96 cm/m   RA Area:     12.30 cm LA Vol (A4C): 48.8 ml 25.82 ml/m  RA Volume:   24.10 ml  12.75 ml/m  AORTIC VALVE                    PULMONIC VALVE AV Area (Vmax):    3.29 cm     PV Vmax:       0.82 m/s AV Area (Vmean):   3.05 cm     PV Vmean:      56.100 cm/s AV Area (VTI):     3.02 cm     PV VTI:        0.135 m AV Vmax:           123.00 cm/s  PV Peak grad:  2.7 mmHg AV Vmean:  77.900 cm/s  PV Mean grad:  1.0 mmHg AV VTI:            0.213 m AV Peak Grad:      6.1 mmHg AV Mean Grad:      3.0 mmHg LVOT Vmax:         89.40 cm/s LVOT Vmean:        52.500 cm/s LVOT VTI:          0.142 m LVOT/AV VTI ratio: 0.67  AORTA Ao Root diam: 3.30 cm Ao Asc diam:  3.40 cm MITRAL VALVE               TRICUSPID VALVE MV Area (PHT): 2.37 cm    TR Peak grad:   15.7 mmHg MV Area VTI:   2.02 cm    TR Vmax:        198.00 cm/s MV Peak grad:  3.6 mmHg MV Mean grad:  2.0 mmHg    SHUNTS MV Vmax:       0.96 m/s    Systemic VTI:  0.14 m MV Vmean:      63.1 cm/s   Systemic Diam: 2.40 cm MV Decel Time: 320 msec MV E velocity: 77.90 cm/s MV A velocity: 87.90 cm/s MV E/A ratio:  0.89 Mihai Croitoru MD Electronically signed by Sanda Klein MD Signature Date/Time: 09/13/2021/10:07:24 AM    Final     Scheduled Meds:  apixaban  2.5 mg Oral BID   atorvastatin  80 mg Oral q morning   Chlorhexidine Gluconate Cloth  6 each Topical Daily   divalproex  500 mg Oral Q12H   fenofibrate  54 mg Oral Daily   finasteride  5 mg Oral q morning   insulin aspart  0-9 Units Subcutaneous TID WC   tamsulosin  0.4 mg Oral q morning    Continuous Infusions:  sodium chloride 75 mL/hr at 09/13/21 1112   cefTRIAXone (ROCEPHIN)  IV Stopped (09/12/21 2230)     LOS: 0 days     Alma Friendly, MD [facility]  If 7PM-7AM, please contact  night-coverage [email or web address] 09/13/2021, 3:02 PM

## 2021-09-13 NOTE — Plan of Care (Signed)
°  Problem: Education: Goal: Knowledge of disease or condition will improve Outcome: Progressing Goal: Understanding of medication regimen will improve Outcome: Progressing Goal: Individualized Educational Video(s) Outcome: Progressing   Problem: Activity: Goal: Ability to tolerate increased activity will improve Outcome: Progressing   Problem: Cardiac: Goal: Ability to achieve and maintain adequate cardiopulmonary perfusion will improve Outcome: Progressing   Problem: Health Behavior/Discharge Planning: Goal: Ability to safely manage health-related needs after discharge will improve Outcome: Progressing   Problem: Education: Goal: Knowledge of General Education information will improve Description: Including pain rating scale, medication(s)/side effects and non-pharmacologic comfort measures Outcome: Progressing   Problem: Health Behavior/Discharge Planning: Goal: Ability to manage health-related needs will improve Outcome: Progressing   Problem: Clinical Measurements: Goal: Ability to maintain clinical measurements within normal limits will improve Outcome: Progressing Goal: Will remain free from infection Outcome: Progressing Goal: Diagnostic test results will improve Outcome: Progressing Goal: Respiratory complications will improve Outcome: Progressing Goal: Cardiovascular complication will be avoided Outcome: Progressing   Problem: Activity: Goal: Risk for activity intolerance will decrease Outcome: Progressing   Problem: Nutrition: Goal: Adequate nutrition will be maintained Outcome: Progressing   Problem: Coping: Goal: Level of anxiety will decrease Outcome: Progressing   Problem: Elimination: Goal: Will not experience complications related to bowel motility Outcome: Progressing Goal: Will not experience complications related to urinary retention Outcome: Progressing   Problem: Pain Managment: Goal: General experience of comfort will improve Outcome:  Progressing   Problem: Safety: Goal: Ability to remain free from injury will improve Outcome: Progressing   Problem: Skin Integrity: Goal: Risk for impaired skin integrity will decrease Outcome: Progressing   Problem: Education: Goal: Knowledge of disease or condition will improve Outcome: Progressing Goal: Knowledge of secondary prevention will improve (SELECT ALL) Outcome: Progressing Goal: Knowledge of patient specific risk factors will improve (INDIVIDUALIZE FOR PATIENT) Outcome: Progressing Goal: Individualized Educational Video(s) Outcome: Progressing   Problem: Coping: Goal: Will verbalize positive feelings about self Outcome: Progressing Goal: Will identify appropriate support needs Outcome: Progressing   Problem: Health Behavior/Discharge Planning: Goal: Ability to manage health-related needs will improve Outcome: Progressing   Problem: Self-Care: Goal: Ability to participate in self-care as condition permits will improve Outcome: Progressing Goal: Verbalization of feelings and concerns over difficulty with self-care will improve Outcome: Progressing Goal: Ability to communicate needs accurately will improve Outcome: Progressing   Problem: Nutrition: Goal: Risk of aspiration will decrease Outcome: Progressing Goal: Dietary intake will improve Outcome: Progressing   Problem: Intracerebral Hemorrhage Tissue Perfusion: Goal: Complications of Intracerebral Hemorrhage will be minimized Outcome: Progressing   Problem: Ischemic Stroke/TIA Tissue Perfusion: Goal: Complications of ischemic stroke/TIA will be minimized Outcome: Progressing   Problem: Spontaneous Subarachnoid Hemorrhage Tissue Perfusion: Goal: Complications of Spontaneous Subarachnoid Hemorrhage will be minimized Outcome: Progressing

## 2021-09-13 NOTE — Evaluation (Signed)
Occupational Therapy Evaluation Patient Details Name: Brendan Long MRN: [id number] DOB: 1929/02/10 Today's Date: 09/13/2021   History of Present Illness 88 yo male was admitted 2/6 for onset of lethargic appearance, poor sleep and intake for 3 weeks and onset of L facial and L hemi weakness.  Had Severe stenosis versus short-segment occlusion of the left P2 PCA. 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  Recent falls with L SDH and new confusion, recent seizures, return of urinary retention, and confusion.  PMHx:  HTN, HLD, CVA, PAF, ablation, B THR's, R rotator cuff repair, hypothyroidism   Clinical Impression   Arek was evaluated s/p the above re-admission, this therapist is familiar with pt from 08/2021 admission. Pt now from SNF with unknown level of assist at the facility due to no family present and pt pleasantly confused. Pt oriented to self only and extremely lethargic, eyes closed 75% of the session. Overall he required mod-max multimodal cues to follow all commands with mod-max A for bed mobility. Once sitting EOB pt required min A to correct L lateral bias. Pt seemingly presents with L field vision impairments, difficult to fully assess due to level of arousal. Pt will benefit from continued OT acutely. Recommend d/c back to facility for continued therapies.     Recommendations for follow up therapy are one component of a multi-disciplinary discharge planning process, led by the attending physician.  Recommendations may be updated based on patient status, additional functional criteria and insurance authorization.   Follow Up Recommendations  Skilled nursing-short term rehab (<3 hours/day) (back to current SNF?)    Assistance Recommended at Discharge Frequent or constant Supervision/Assistance  Patient can return home with the following A little help with walking and/or transfers;A little help with  bathing/dressing/bathroom;Direct supervision/assist for financial management;Direct supervision/assist for medications management;Assistance with cooking/housework;Assist for transportation    Functional Status Assessment  Patient has had a recent decline in their functional status and demonstrates the ability to make significant improvements in function in a reasonable and predictable amount of time.  Equipment Recommendations  Other (comment) (defer)    Recommendations for Other Services       Precautions / Restrictions Precautions Precautions: Fall Restrictions Weight Bearing Restrictions: No      Mobility Bed Mobility Overal bed mobility: Needs Assistance Bed Mobility: Supine to Sit, Sit to Supine     Supine to sit: Max assist Sit to supine: Mod assist   General bed mobility comments: assist for BLE and trunk elevation - pt lethargic and required increased assist    Transfers Overall transfer level: Needs assistance                 General transfer comment: unable to attempt due to lethargy      Balance Overall balance assessment: Needs assistance Sitting-balance support: Feet supported Sitting balance-Leahy Scale: Poor Sitting balance - Comments: required constant min A for sitting balance, L lateral bias Postural control: Left lateral lean, Posterior lean                                 ADL either performed or assessed with clinical judgement   ADL Overall ADL's : Needs assistance/impaired Eating/Feeding: Minimal assistance;Sitting   Grooming: Minimal assistance;Sitting   Upper Body Bathing: Moderate assistance;Sitting   Lower Body Bathing: Maximal assistance;+2 for physical assistance;Sit to/from stand   Upper Body Dressing : Minimal assistance;Sitting  Lower Body Dressing: Maximal assistance;+2 for physical assistance;Sit to/from stand   Toilet Transfer: Maximal assistance;+2 for physical assistance;Stand-pivot   Toileting-  Clothing Manipulation and Hygiene: Maximal assistance;+2 for physical assistance;Sit to/from stand       Functional mobility during ADLs: Maximal assistance General ADL Comments: limited by lethargy, L lateral bias, impaired cognition and L visual cut     Vision Baseline Vision/History: 0 No visual deficits Ability to See in Adequate Light: 0 Adequate Patient Visual Report: Peripheral vision impairment Vision Assessment?: Vision impaired- to be further tested in functional context;Yes Eye Alignment: Within Functional Limits Visual Fields: Left homonymous hemianopsia;Left visual field deficit;Impaired-to be further tested in functional context Additional Comments: Difficult to fully assess as pt was lethargic, keeping eyes closed the majority of the session. Seemingly presents wtih L field cut, midline? Pt stating "yeah I see half of your face" when standing directly in front of him.            Pertinent Vitals/Pain Pain Assessment Pain Assessment: Faces Faces Pain Scale: Hurts a little bit Pain Location: back? generalized with movement Pain Descriptors / Indicators: Grimacing Pain Intervention(s): Limited activity within patient's tolerance, Monitored during session     Hand Dominance Right   Extremity/Trunk Assessment Upper Extremity Assessment Upper Extremity Assessment: Generalized weakness;LUE deficits/detail LUE Deficits / Details: ROM is WFL, strength is generally weaker than R. Poor finger to nose coordination. Sensation is intact LUE Sensation: WNL LUE Coordination: decreased fine motor;decreased gross motor   Lower Extremity Assessment Lower Extremity Assessment: Defer to PT evaluation   Cervical / Trunk Assessment Cervical / Trunk Assessment: Kyphotic   Communication Communication Communication: Expressive difficulties   Cognition Arousal/Alertness: Lethargic Behavior During Therapy: Flat affect Overall Cognitive Status: Impaired/Different from baseline Area  of Impairment: Orientation, Attention, Memory, Following commands, Safety/judgement, Awareness, Problem solving                 Orientation Level: Disoriented to, Place, Time, Situation Current Attention Level: Selective Memory: Decreased short-term memory, Decreased recall of precautions Following Commands: Follows one step commands with increased time, Follows one step commands inconsistently Safety/Judgement: Decreased awareness of deficits, Decreased awareness of safety Awareness: Intellectual Problem Solving: Slow processing, Decreased initiation, Requires verbal cues General Comments: Pt oriented to self only. Otherwise reporting: "united [employer]", "1993," "hospital in [place]" "how many do they put in here" "there are two bags for each." Pleasantly confused throughout, follows one step commnads with multimodiual cues. Pt lethargic, keeping eyes closed >75% of the session.     General Comments  VSS on RA, folwy intact. Dr. Erlinda Hong present for part of the exam            Home Living Family/patient expects to be discharged to:: Unsure Living Arrangements: Alone Available Help at Discharge: Family;Available 24 hours/day;Available PRN/intermittently Type of Home: House Home Access: Stairs to enter [employer] of Steps: 2 steps   Home Layout: One level     Bathroom Shower/Tub: Occupational psychologist: Standard     Home Equipment: Chartered certified accountant;Shower seat - built in   Additional Comments: history from chart  Lives With: Alone    Prior Functioning/Environment Prior Level of Function : Needs assist       Physical Assist : Mobility (physical);ADLs (physical) Mobility (physical): Gait ADLs (physical): Grooming;Dressing;IADLs;Toileting Mobility Comments: RW ADLs Comments: d/c to SNF after recent admission, likely needing assist for ADLs - pt unable to confirm due to confusinon        OT  Problem List: Decreased strength;Decreased activity  tolerance;Impaired balance (sitting and/or standing);Decreased coordination;Decreased cognition;Decreased safety awareness;Decreased knowledge of use of DME or AE;Impaired vision/perception      OT Treatment/Interventions: Therapeutic exercise;Self-care/ADL training;Energy conservation;DME and/or AE instruction;Therapeutic activities;Cognitive remediation/compensation;Patient/family education;Balance training    OT Goals(Current goals can be found in the care plan section) Acute Rehab OT Goals Patient Stated Goal: did not state OT Goal Formulation: With patient Time For Goal Achievement: 09/27/21 Potential to Achieve Goals: Good ADL Goals Pt Will Perform Upper Body Dressing: with set-up;sitting Pt Will Perform Lower Body Dressing: with set-up;sit to/from stand Pt Will Transfer to Toilet: with supervision;ambulating Additional ADL Goal #1: Pt will indep locate items to his L for grooming 100% of the time  OT Frequency: Min 2X/week       AM-PAC OT "6 Clicks" Daily Activity     Outcome Measure Help from another person eating meals?: A Little Help from another person taking care of personal grooming?: A Little Help from another person toileting, which includes using toliet, bedpan, or urinal?: A Lot Help from another person bathing (including washing, rinsing, drying)?: A Lot Help from another person to put on and taking off regular upper body clothing?: A Little Help from another person to put on and taking off regular lower body clothing?: A Lot 6 Click Score: 15   End of Session Nurse Communication: Mobility status  Activity Tolerance: Patient tolerated treatment well Patient left: in bed;with call bell/phone within reach;with bed alarm set  OT Visit Diagnosis: Unsteadiness on feet (R26.81);Other abnormalities of gait and mobility (R26.89);Muscle weakness (generalized) (M62.81)                Time: 3662-9476 OT Time Calculation (min): 19 min Charges:  OT General Charges $OT  Visit: 1 Visit OT Evaluation $OT Eval Moderate Complexity: 1 Mod   Tamie Minteer A Jacy Brocker 09/13/2021, 11:23 AM

## 2021-09-13 NOTE — Progress Notes (Signed)
Physical Therapy Treatment Patient Details Name: Brendan Long MRN: [id number] DOB: 1929/02/19 Today's Date: 09/13/2021   History of Present Illness 88 yo male was admitted 2/6 for onset of lethargic appearance, poor sleep and intake for 3 weeks and onset of L facial and L hemi weakness.  Had Severe stenosis versus short-segment occlusion of the left P2 PCA. 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  Recent falls with L SDH and new confusion, recent seizures, return of urinary retention, and confusion.  PMHx:  HTN, HLD, CVA, PAF, ablation, B THR's, R rotator cuff repair, hypothyroidism    PT Comments    Patient lethargic at start and kept eyes closed for 75% of session. Pt required repeated cues to open eyes for scanning to place hands on RW and to increase alertness. Pt able to look out the window and report it is a parking lot down below. Pt having difficulty locating items on food tray due to repeatedly closing eyes; unsure of visual field deficits. Patient required Mod-Max Assist to move to EOB and stand with RW. Manual assist required to move Lt LE and complete side step to recliner. Pt unable to fully step due to fatigue and max assist required to pivot hips to sit in chair. Pt more alert in recliner with food, and excited for brownie, tea, and ice cream. RN notified of +2 assist for transfer back to bed. He will benefit from continued skilled PT interventions to progress mobility at [facility] rehab. Acute PT will progress pt as able.    Recommendations for follow up therapy are one component of a multi-disciplinary discharge planning process, led by the attending physician.  Recommendations may be updated based on patient status, additional functional criteria and insurance authorization.  Follow Up Recommendations  Skilled nursing-short term rehab (<3 hours/day)     Assistance Recommended at Discharge Frequent or constant  Supervision/Assistance  Patient can return home with the following Two people to help with walking and/or transfers;Two people to help with bathing/dressing/bathroom;Assistance with cooking/housework;Assistance with feeding   Equipment Recommendations  None recommended by PT    Recommendations for Other Services       Precautions / Restrictions Precautions Precautions: Fall Restrictions Weight Bearing Restrictions: No     Mobility  Bed Mobility Overal bed mobility: Needs Assistance Bed Mobility: Supine to Sit     Supine to sit: Max assist, Mod assist, HOB elevated     General bed mobility comments: Mod-Max Assist to initiate and bring LE's off EOB, pt required repeat verbal cues to pull up with Rt UE to raise trunk. Mod assit to move to EOB and Max to raise trunk fully. Pt able to maintain seated balance with UE support on bed rails and then transitioned Rt hand to Rt knee for more anterior trunk lean.    Transfers Overall transfer level: Needs assistance Equipment used: Rolling walker (2 wheels) Transfers: Sit to/from Stand, Bed to chair/wheelchair/BSC Sit to Stand: Mod assist, From elevated surface Stand pivot transfers: Mod assist, Max assist, From elevated surface         General transfer comment: Pt completed 2x stand from EOB and Mod assist required with verbal/tactile cues for safe hand placement on RW. Pt took small steps towards [facility] and required Mod assist to increase Lt hip abduction for side step. seated break provided. On second stnad pt fatigued and unable to take more than 1 lateral step towards recliner. Max assist to finish  pivot to sit in recliner.    Ambulation/Gait                   Stairs             Wheelchair Mobility    Modified Rankin (Stroke Patients Only)       Balance Overall balance assessment: Needs assistance Sitting-balance support: Feet supported Sitting balance-Leahy Scale: Poor Sitting balance - Comments:  required min assist at start and progressed to min guard with bil UE support. Postural control: Posterior lean Standing balance support: Bilateral upper extremity supported, During functional activity, Reliant on assistive device for balance Standing balance-Leahy Scale: Poor Standing balance comment: heavy reliance on RW and therapist for support                            Cognition Arousal/Alertness: Lethargic Behavior During Therapy: Flat affect Overall Cognitive Status: Impaired/Different from baseline Area of Impairment: Orientation, Attention, Memory, Following commands, Safety/judgement, Awareness, Problem solving                 Orientation Level: Disoriented to, Place, Time, Situation Current Attention Level: Sustained Memory: Decreased short-term memory, Decreased recall of precautions Following Commands: Follows one step commands with increased time, Follows one step commands inconsistently Safety/Judgement: Decreased awareness of deficits, Decreased awareness of safety Awareness: Intellectual Problem Solving: Slow processing, Decreased initiation, Requires verbal cues, Difficulty sequencing, Requires tactile cues General Comments: pt lethargic but became more alert once sitting EOB. has tendency to keep eyes closed and requires repeated cues to open eyes for mobility. Pleasant and smiled 2x during session but overall falt affect. pt more active once in recliner with brownie and iced tea.        Exercises      General Comments General comments (skin integrity, edema, etc.): VSS throughout      Pertinent Vitals/Pain Pain Assessment Pain Assessment: Faces Faces Pain Scale: Hurts a little bit Pain Location: generalized with mobility Pain Descriptors / Indicators: Grimacing Pain Intervention(s): Limited activity within patient's tolerance, Monitored during session, Repositioned    Home Living                          Prior Function             PT Goals (current goals can now be found in the care plan section) Acute Rehab PT Goals Patient Stated Goal: none stated PT Goal Formulation: Patient unable to participate in goal setting Time For Goal Achievement: 09/26/21 Potential to Achieve Goals: Fair Progress towards PT goals: Progressing toward goals    Frequency    Min 3X/week      PT Plan Current plan remains appropriate    Co-evaluation              AM-PAC PT "6 Clicks" Mobility   Outcome Measure  Help needed turning from your back to your side while in a flat bed without using bedrails?: A Lot Help needed moving from lying on your back to sitting on the side of a flat bed without using bedrails?: A Lot Help needed moving to and from a bed to a chair (including a wheelchair)?: A Lot Help needed standing up from a chair using your arms (e.g., wheelchair or bedside chair)?: A Lot Help needed to walk in hospital room?: Total Help needed climbing 3-5 steps with a railing? : Total 6 Click Score: 10  End of Session Equipment Utilized During Treatment: Gait belt Activity Tolerance: Patient tolerated treatment well;Patient limited by lethargy Patient left: in chair;with call bell/phone within reach;with chair alarm set;with nursing/sitter in room Nurse Communication: Mobility status (2+ assist) PT Visit Diagnosis: Other abnormalities of gait and mobility (R26.89);Muscle weakness (generalized) (M62.81);Hemiplegia and hemiparesis Hemiplegia - Right/Left: Left Hemiplegia - dominant/non-dominant: Non-dominant     Time: 9628-3662 PT Time Calculation (min) (ACUTE ONLY): 34 min  Charges:  $Therapeutic Activity: 23-37 mins                     Brendan Long, DPT Acute Rehabilitation Services Office [phone] Pager [phone]    Brendan Long 09/13/2021, 3:11 PM

## 2021-09-13 NOTE — Progress Notes (Addendum)
STROKE TEAM PROGRESS NOTE   INTERVAL HISTORY Patient is currently working with OT, needing full support to sit at the edge of the bed. He has a foley from urology outpatient for urinary retention. Palliative care has been consulted per family request.   Vitals:   09/12/21 2345 09/13/21 0408 09/13/21 0734 09/13/21 1129  BP: 123/67 117/65 133/74 (!) 143/71  Pulse: 66 72 69 80  Resp: 17 17 16 16   Temp: 98 F (36.7 C) 99 F (37.2 C) 98.3 F (36.8 C)   TempSrc: Oral Oral Oral   SpO2: 95% 95% 98% 96%  Weight:      Height:       CBC:  Recent Labs  Lab 09/12/21 1202 09/13/21 0301  WBC 14.6* 15.3*  NEUTROABS 11.8*  --   HGB 16.0 14.9  HCT 48.0 44.4  MCV 93.8 92.1  PLT 132* 742*   Basic Metabolic Panel:  Recent Labs  Lab 09/12/21 1202 09/13/21 0301  NA 132* 135  K 3.7 3.5  CL 94* 100  CO2 26 27  GLUCOSE 130* 89  BUN 35* 37*  CREATININE 2.06* 1.85*  CALCIUM 8.9 8.5*   Lipid Panel:  Recent Labs  Lab 09/13/21 0301  CHOL 198  TRIG 85  HDL 23*  CHOLHDL 8.6  VLDL 17  LDLCALC 158*   HgbA1c:  Recent Labs  Lab 09/13/21 0301  HGBA1C 5.6   Urine Drug Screen:  Recent Labs  Lab 09/12/21 1557  LABOPIA NONE DETECTED  COCAINSCRNUR NONE DETECTED  LABBENZ NONE DETECTED  AMPHETMU NONE DETECTED  THCU NONE DETECTED  LABBARB NONE DETECTED    Alcohol Level  Recent Labs  Lab 09/12/21 1202  ETH <10    IMAGING past 24 hours MR BRAIN WO CONTRAST  Result Date: 09/12/2021 CLINICAL DATA:  Initial evaluation for neuro deficit, stroke. EXAM: MRI HEAD WITHOUT CONTRAST TECHNIQUE: Multiplanar, multiecho pulse sequences of the brain and surrounding structures were obtained without intravenous contrast. COMPARISON:  Prior CTs from earlier the same day. FINDINGS: Brain: Examination moderately to severely degraded by motion artifact. Diffuse prominence of the CSF containing spaces compatible generalized cerebral atrophy. Mild chronic microvascular ischemic disease noted involving the  periventricular white matter. Few scattered remote lacunar infarcts noted about the bilateral basal ganglia/corona radiata. Remote lacunar infarct present at the right pons. Encephalomalacia and gliosis involving the right occipital lobe compatible with a chronic right PCA distribution infarct. Few additional scattered bilateral cerebellar infarcts noted, right greater than left. Patchy diffusion signal abnormality involving the right corona radiata measures approximately 2 cm in size, consistent with an acute to early subacute ischemic infarct (series 3, image 33). No associated hemorrhage or mass effect. No other evidence for acute or subacute ischemia. Gray-white matter differentiation otherwise maintained. No mass lesion. Ventricles normal size without hydrocephalus. Known subacute left subdural hematoma again seen, grossly stable measuring up to approximately 5-6 mm without significant mass effect or midline shift. Basilar cisterns remain patent. Vascular: Major intracranial vascular flow voids are maintained. Skull and upper cervical spine: Craniocervical junction grossly within normal limits on this motion degraded exam. Bone marrow signal intensity grossly normal. No scalp soft tissue abnormality. Sinuses/Orbits: Prior ocular lens replacement on the left. Right gaze noted. Chronic mucosal thickening noted throughout the ethmoidal air cells and maxillary sinuses. No significant mastoid effusion. Other: None. IMPRESSION: 1. Motion degraded exam. 2. 2 cm acute to early subacute ischemic nonhemorrhagic infarct involving the right corona radiata. 3. Chronic right PCA distribution infarct, with additional scattered remote  lacunar infarcts involving the bilateral basal ganglia/corona radiata, right pons, and bilateral cerebellar hemispheres. 4. Stable subacute left subdural hematoma measuring up to 5-6 mm without significant mass effect or midline shift. Electronically Signed   By: Jeannine Boga M.D.   On:  09/12/2021 20:52   DG CHEST PORT 1 VIEW  Result Date: 09/12/2021 CLINICAL DATA:  Altered mental status. EXAM: PORTABLE CHEST 1 VIEW COMPARISON:  August 23, 2021. FINDINGS: The heart size and mediastinal contours are within normal limits. Both lungs are clear. The visualized skeletal structures are unremarkable. IMPRESSION: No active disease. Electronically Signed   By: Marijo Conception M.D.   On: 09/12/2021 15:04   ECHOCARDIOGRAM COMPLETE  Result Date: 09/13/2021    ECHOCARDIOGRAM REPORT   Patient Name:   Brendan Long [facility] Date of Exam: 09/13/2021 Medical Rec #:  [id number]          Height:       70.5 in Accession #:    [id number]         Weight:       156.4 lb Date of Birth:  11/16/1928          BSA:          1.890 m Patient Age:    88 years           BP:           133/74 mmHg Patient Gender: M                  HR:           74 bpm. Exam Location:  Inpatient Procedure: 2D Echo, Cardiac Doppler, Color Doppler and Intracardiac            Opacification Agent Indications:    CVA  History:        Patient has prior history of Echocardiogram examinations, most                 recent 05/07/2020. Stroke; Risk Factors:Dyslipidemia and                 Hypertension.  Sonographer:    Luisa Hart RDCS Referring Phys: [id number] Brendan Long IMPRESSIONS  1. Left ventricular ejection fraction, by estimation, is 55 to 60%. The left ventricle has normal function. The left ventricle has no regional wall motion abnormalities. There is mild concentric left ventricular hypertrophy. Left ventricular diastolic parameters are consistent with Grade I diastolic dysfunction (impaired relaxation).  2. Right ventricular systolic function is normal. The right ventricular size is normal. There is normal pulmonary artery systolic pressure.  3. Left atrial size was mildly dilated.  4. The mitral valve is grossly normal. No evidence of mitral valve regurgitation. Moderate mitral annular calcification.  5. The aortic valve was not well visualized.  Aortic valve regurgitation is not visualized. Aortic valve sclerosis is present, with no evidence of aortic valve stenosis.  6. The inferior vena cava is dilated in size with >50% respiratory variability, suggesting right atrial pressure of 8 mmHg. FINDINGS  Left Ventricle: Left ventricular ejection fraction, by estimation, is 55 to 60%. The left ventricle has normal function. The left ventricle has no regional wall motion abnormalities. The left ventricular internal cavity size was normal in size. There is  mild concentric left ventricular hypertrophy. Left ventricular diastolic parameters are consistent with Grade I diastolic dysfunction (impaired relaxation). Indeterminate filling pressures. Right Ventricle: The right ventricular size is normal. No increase in right ventricular wall thickness. Right ventricular systolic  function is normal. There is normal pulmonary artery systolic pressure. The tricuspid regurgitant velocity is 1.98 m/s, and  with an assumed right atrial pressure of 8 mmHg, the estimated right ventricular systolic pressure is 38.1 mmHg. Left Atrium: Left atrial size was mildly dilated. Right Atrium: Right atrial size was normal in size. Pericardium: There is no evidence of pericardial effusion. Mitral Valve: The mitral valve is grossly normal. Moderate mitral annular calcification. No evidence of mitral valve regurgitation. MV peak gradient, 3.6 mmHg. The mean mitral valve gradient is 2.0 mmHg. Tricuspid Valve: The tricuspid valve is normal in structure. Tricuspid valve regurgitation is trivial. Aortic Valve: The aortic valve was not well visualized. Aortic valve regurgitation is not visualized. Aortic valve sclerosis is present, with no evidence of aortic valve stenosis. Aortic valve mean gradient measures 3.0 mmHg. Aortic valve peak gradient measures 6.1 mmHg. Aortic valve area, by VTI measures 3.02 cm. Pulmonic Valve: The pulmonic valve was not well visualized. Pulmonic valve regurgitation is  not visualized. Aorta: The aortic root and ascending aorta are structurally normal, with no evidence of dilitation. Venous: IVC assessment for right atrial pressure unable to be performed due to mechanical ventilation. The inferior vena cava is dilated in size with greater than 50% respiratory variability, suggesting right atrial pressure of 8 mmHg. IAS/Shunts: No atrial level shunt detected by color flow Doppler.  LEFT VENTRICLE PLAX 2D LVIDd:         4.50 cm     Diastology LVIDs:         3.10 cm     LV e' medial:    4.56 cm/s LV PW:         1.20 cm     LV E/e' medial:  17.1 LV IVS:        1.10 cm     LV e' lateral:   7.95 cm/s LVOT diam:     2.40 cm     LV E/e' lateral: 9.8 LV SV:         64 LV SV Index:   34 LVOT Area:     4.52 cm  LV Volumes (MOD) LV vol d, MOD A2C: 48.1 ml LV vol d, MOD A4C: 85.8 ml LV vol s, MOD A2C: 24.4 ml LV vol s, MOD A4C: 48.2 ml LV SV MOD A2C:     23.7 ml LV SV MOD A4C:     85.8 ml LV SV MOD BP:      29.4 ml RIGHT VENTRICLE RV Basal diam:  2.80 cm RV Mid diam:    2.50 cm RV S prime:     11.80 cm/s LEFT ATRIUM           Index        RIGHT ATRIUM           Index LA diam:      3.70 cm 1.96 cm/m   RA Area:     12.30 cm LA Vol (A4C): 48.8 ml 25.82 ml/m  RA Volume:   24.10 ml  12.75 ml/m  AORTIC VALVE                    PULMONIC VALVE AV Area (Vmax):    3.29 cm     PV Vmax:       0.82 m/s AV Area (Vmean):   3.05 cm     PV Vmean:      56.100 cm/s AV Area (VTI):     3.02 cm     PV  VTI:        0.135 m AV Vmax:           123.00 cm/s  PV Peak grad:  2.7 mmHg AV Vmean:          77.900 cm/s  PV Mean grad:  1.0 mmHg AV VTI:            0.213 m AV Peak Grad:      6.1 mmHg AV Mean Grad:      3.0 mmHg LVOT Vmax:         89.40 cm/s LVOT Vmean:        52.500 cm/s LVOT VTI:          0.142 m LVOT/AV VTI ratio: 0.67  AORTA Ao Root diam: 3.30 cm Ao Asc diam:  3.40 cm MITRAL VALVE               TRICUSPID VALVE MV Area (PHT): 2.37 cm    TR Peak grad:   15.7 mmHg MV Area VTI:   2.02 cm    TR Vmax:         198.00 cm/s MV Peak grad:  3.6 mmHg MV Mean grad:  2.0 mmHg    SHUNTS MV Vmax:       0.96 m/s    Systemic VTI:  0.14 m MV Vmean:      63.1 cm/s   Systemic Diam: 2.40 cm MV Decel Time: 320 msec MV E velocity: 77.90 cm/s MV A velocity: 87.90 cm/s MV E/A ratio:  0.89 Mihai Croitoru MD Electronically signed by Sanda Klein MD Signature Date/Time: 09/13/2021/10:07:24 AM    Final     PHYSICAL EXAM  Physical Exam  Constitutional: Appears well-developed and well-nourished.  Cardiovascular: Normal rate and regular rhythm.  Respiratory: Effort normal, non-labored breathing  Neuro: Mental Status: Patient is drowsy, oriented to person. Disoriented to date, place, and situation.  Closes eyes during exam even while speaking He does not know why he is in the hospital.  No signs of aphasia or neglect Cranial Nerves: II: Visual Fields are full. Pupils are equal, round, and reactive to light.  III,IV, VI: EOMI without ptosis or diploplia.  V: Facial sensation is symmetric to temperature VII: Facial movement is symmetric resting and smiling VIII: Hearing is intact to voice X: Palate elevates symmetrically XI: Shoulder shrug is symmetric. XII: Tongue protrudes midline without atrophy or fasciculations.  Motor: Tone is decreased. Bulk is decreased. Left side is weaker than the right.  RUE  5/5  LUE 4/5  Left hand grasp weak compared to right hand RLE  5/5   LLE 4/5 Left plantar and dorsiflexion weak compared to the right foot Patient is unable to support himself while sitting on the bedside.  He needs maximum assist for transfer. Sensory: Sensation is symmetric to light touch and temperature in the arms and legs. No extinction to DSS present.  Cerebellar: Unable to complete FNF and HTK while sitting on the side of the bed Gait deferred due to safety   ASSESSMENT/PLAN [name] is a 88 y.o. male with history of  HTN, HLD, hx of CVA, PAF s/p ablation 15 years ago not on anticoagulation with  recent SDH, recent subdural hematoma s/p fall presenting with lethargy, left facial droop and left-sided weakness after working with PT at his facility.  Family states he has had a sick nephric and decline in function over the last 3 weeks.  They are open to conversation with the palliative care  team regarding hospice and palliative care.  Stroke: 2 cm ischemic infarct involving the right corona radiata infarct likely secondary to small vessel disease. However, can not rule out embolic since afib not on [facility] Code [place] acute hemorrhage. Increased ill-defined low -density in the region of the right corona radiata and basal ganglia may reflect recent small vessel infarcts.  Mixed density left subdural hematoma, likely similar in size. Decreased hyperdense hemorrhage.    CTA head & neck Head: Severe stenosis versus short-segment occlusion of the left P2 PCA. 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 Severe stenosis of the right A2 ACA. Multifocal severe bilateral M2 MCA stenosis.  CTA neck: Approx 60% stenosis of the left carotid artery origin.  MRI  2 cm acute to early subacute ischemic nonhemorrhagic infarct involving the right corona radiata. 2D Echo EF 55-60%.  Left ventricle has normal function.  No regional wall abnormalities.  No shunt detected LDL 158 HgbA1c 5.6 VTE prophylaxis - SCDs No antithrombotic since SDH in 08/2021 prior to admission, now on Eliquis (apixaban) daily. But further pending GOC discussion.   Therapy recommendations: SNF Disposition: Pending  Hypertension Home meds: Metoprolol 50 mg twice a day Stable Long-term BP goal normotensive  Hyperlipidemia Home meds: Lipitor 80 mg daily, resumed in hospital LDL 158, goal < 70 Continue statin at discharge  Atrial fibrillation Was on Eliquis up until his [facility] 08/2021 Will resume eliquis 2.5mg  bid with close monitoring pending goals of care discussion  Other  Stroke Risk Factors Advanced Age >/= 22  History of stroke 05/2020 MRI showed left BG and left insular infarcts.  MRI right PCA stenosis, right M1 and M2 and A2 stenosis.  Carotid Doppler negative.  EF 55 to 60%, LDL 132 and A1c 6.0.  Discharged on Eliquis and Lipitor 80.  Other Active Problems Recent subdural hematoma Recent hospitalization 1/17-1/25/23 for fall and subdural hematoma. Neurosurgery admitted with observation. He developed urinary retention, but was removed prior to discharge. He alos had what appeared to be a seziure on 1/19 and 1/22. Stroke ruled out. ? Hypotension. Neurology started keppra, but changed to depakote on 08/30/21 due to rash. Eliquis was held. He also has a foley and saw urology where they had to place it again this past week as he continued to have urinary retention.  Depakote 500 mg every 12 hours Urinary retention Foley catheter placed by urology outpatient UTI-urine culture positive for Proteus Mirabilis Failure to thrive-decline in p.o. intake in function over the last 3 weeks, continue DNR status Palliative care consult Family contemplating hospice given significant decline in patient's health and functionality AKI on CKD3B Cr 2.06-> 1.85 IVF infusing    Hospital day # 0  Patient seen and examined by NP/APP with MD. MD to update note as needed.   Janine Ores, DNP, FNP-BC [facility] Pager: [phone]  ATTENDING NOTE: I reviewed above note and agree with the assessment and plan. Pt was seen and examined.   88 year old male with history of hypertension, hyperlipidemia, A-fib on Eliquis, recent subdural hematoma with seizure, history of stroke in 05/2020 admitted for left-sided weakness, altered mental status, lethargy with low-grade fever.  Patient had stroke in 05/2020 with left BG and left insular cortex infarct on MRI.  MRI showed right PCA, M1, M2 and A2 stenosis.  Carotid Doppler negative.  EF 55 to 60%, LDL 132, A1c 6.0.   Patient discharged on Eliquis and Lipitor 80.  Patient had a  fall in 08/2021 with subdural hematoma, Eliquis discontinued.  Patient then had syncope versus seizure episode, put on Depakote, EEG negative.  On this admission, CT no acute abnormality, chronic left-sided subdural hematoma.  CT head and neck again showed left P2 short segment occlusion, bilateral M1/M2 stenosis, left VA and distal BA, right P2 and A2 stenosis, left ICA 60% stenosis.  MRI showed right CR infarct and stable SDH.  UA 21-50, 6 are negative.  EF 55 to 60%, ammonia level 22, Depakote level 57, UDS negative, creatinine 2.06, WBC 14.6 ->15.3.  On exam, no family at bedside, patient sitting at edge of bed with OT, lethargic barely opening eyes to voice, however orientated to place, age but not oriented to time.  No aphasia, hypophonia with paucity of speech, able to name and repeat.  Follows simple commands.  Facial symmetrical, left upper and lower extremity 4/5 proximal and 3/5 distal, right upper and lower extremity 4/5.  Sensation symmetrical, finger-to-nose not cooperative.  Gait not tested.  Etiology for patient stroke likely small vessel disease given location.  However, patient does have A-fib not on AC, cannot rule out cardiac embolic stroke.  Patient subdural hematoma has been absorbed and stable, will restart Eliquis 2.5 mg twice daily given elevated creatinine and age.  However, noted that palliative care has involved, further GOC pending, family may lean towards hospice.  Given UA and leukocytosis with low-grade fever, patient does open for UTI treatment.  Continue Lipitor 80 and Depakote, Tricor at this time.  Will follow.  For detailed assessment and plan, please refer to above as I have made changes wherever appropriate.   Rosalin Hawking, MD PhD Stroke Neurology 09/13/2021 8:06 PM    To contact Stroke Continuity provider, please refer to [email or web address] After hours, contact General Neurology

## 2021-09-14 ENCOUNTER — Other Ambulatory Visit (HOSPITAL_COMMUNITY): Payer: Self-pay

## 2021-09-14 DIAGNOSIS — I63412 Cerebral infarction due to embolism of left middle cerebral artery: Secondary | ICD-10-CM

## 2021-09-14 DIAGNOSIS — R569 Unspecified convulsions: Secondary | ICD-10-CM

## 2021-09-14 LAB — CBC WITH DIFFERENTIAL/PLATELET
Abs Immature Granulocytes: 0.1 10*3/uL — ABNORMAL HIGH (ref 0.00–0.07)
Basophils Absolute: 0 10*3/uL (ref 0.0–0.1)
Basophils Relative: 0 %
Eosinophils Absolute: 0.3 10*3/uL (ref 0.0–0.5)
Eosinophils Relative: 2 %
HCT: 38.7 % — ABNORMAL LOW (ref 39.0–52.0)
Hemoglobin: 13 g/dL (ref 13.0–17.0)
Immature Granulocytes: 1 %
Lymphocytes Relative: 8 %
Lymphs Abs: 1.1 10*3/uL (ref 0.7–4.0)
MCH: 31.1 pg (ref 26.0–34.0)
MCHC: 33.6 g/dL (ref 30.0–36.0)
MCV: 92.6 fL (ref 80.0–100.0)
Monocytes Absolute: 1.4 10*3/uL — ABNORMAL HIGH (ref 0.1–1.0)
Monocytes Relative: 10 %
Neutro Abs: 11.7 10*3/uL — ABNORMAL HIGH (ref 1.7–7.7)
Neutrophils Relative %: 79 %
Platelets: 112 10*3/uL — ABNORMAL LOW (ref 150–400)
RBC: 4.18 MIL/uL — ABNORMAL LOW (ref 4.22–5.81)
RDW: 13.4 % (ref 11.5–15.5)
WBC: 14.6 10*3/uL — ABNORMAL HIGH (ref 4.0–10.5)
nRBC: 0 % (ref 0.0–0.2)

## 2021-09-14 LAB — BASIC METABOLIC PANEL
Anion gap: 9 (ref 5–15)
BUN: 33 mg/dL — ABNORMAL HIGH (ref 8–23)
CO2: 24 mmol/L (ref 22–32)
Calcium: 8 mg/dL — ABNORMAL LOW (ref 8.9–10.3)
Chloride: 102 mmol/L (ref 98–111)
Creatinine, Ser: 1.85 mg/dL — ABNORMAL HIGH (ref 0.61–1.24)
GFR, Estimated: 34 mL/min — ABNORMAL LOW (ref >60)
Glucose, Bld: 105 mg/dL — ABNORMAL HIGH (ref 70–99)
Potassium: 3.4 mmol/L — ABNORMAL LOW (ref 3.5–5.1)
Sodium: 135 mmol/L (ref 135–145)

## 2021-09-14 LAB — URINE CULTURE: Culture: >=100000 — AB

## 2021-09-14 LAB — GLUCOSE, CAPILLARY
Glucose-Capillary: 86 mg/dL (ref 70–99)
Glucose-Capillary: 71 mg/dL (ref 70–99)

## 2021-09-14 MED ORDER — ONDANSETRON HCL 4 MG/2ML IJ SOLN: 4.0000 mg | Freq: Four times a day (QID) | INTRAMUSCULAR | Status: DC | PRN

## 2021-09-14 MED ORDER — LORAZEPAM 1 MG PO TABS: 1.0000 mg | ORAL_TABLET | ORAL | Status: DC | PRN

## 2021-09-14 MED ORDER — GLYCOPYRROLATE 0.2 MG/ML IJ SOLN: 0.2000 mg | INTRAMUSCULAR | Status: DC | PRN

## 2021-09-14 MED ORDER — LORAZEPAM 2 MG/ML IJ SOLN: 1.0000 mg | INTRAMUSCULAR | Status: DC | PRN

## 2021-09-14 MED ORDER — ONDANSETRON 4 MG PO TBDP: 4.0000 mg | ORAL_TABLET | Freq: Four times a day (QID) | ORAL | Status: DC | PRN

## 2021-09-14 MED ORDER — LORAZEPAM 2 MG/ML PO CONC: 1.0000 mg | ORAL | Status: DC | PRN

## 2021-09-14 MED ORDER — BIOTENE DRY MOUTH MT LIQD
15.0000 mL | Freq: Two times a day (BID) | OROMUCOSAL | Status: DC
  Administered 2021-09-14: 15 mL via TOPICAL

## 2021-09-14 MED ORDER — GLYCOPYRROLATE 1 MG PO TABS
1.0000 mg | ORAL_TABLET | ORAL | Status: DC | PRN
  Filled 2021-09-14: qty 1

## 2021-09-14 MED ORDER — POLYVINYL ALCOHOL 1.4 % OP SOLN: 1.0000 [drp] | Freq: Four times a day (QID) | OPHTHALMIC | Status: DC | PRN

## 2021-09-14 MED ORDER — LORAZEPAM 2 MG/ML IJ SOLN
1.0000 mg | Freq: Four times a day (QID) | INTRAMUSCULAR | Status: DC
  Administered 2021-09-14 – 2021-09-15 (×2): 1 mg via INTRAVENOUS
  Filled 2021-09-14 (×2): qty 1

## 2021-09-14 MED ORDER — HYDROMORPHONE HCL 1 MG/ML IJ SOLN
0.5000 mg | INTRAMUSCULAR | Status: DC | PRN
  Administered 2021-09-15: 0.5 mg via INTRAVENOUS
  Filled 2021-09-14: qty 1

## 2021-09-14 MED ORDER — HALOPERIDOL LACTATE 2 MG/ML PO CONC
5.0000 mg | Freq: Four times a day (QID) | ORAL | Status: DC | PRN
  Filled 2021-09-14: qty 2.5

## 2021-09-14 MED ORDER — HALOPERIDOL 0.5 MG PO TABS: 2.0000 mg | ORAL_TABLET | Freq: Four times a day (QID) | ORAL | Status: DC | PRN

## 2021-09-14 MED ORDER — POTASSIUM CHLORIDE CRYS ER 20 MEQ PO TBCR
40.0000 meq | EXTENDED_RELEASE_TABLET | Freq: Once | ORAL | Status: AC
  Administered 2021-09-14: 40 meq via ORAL
  Filled 2021-09-14: qty 2

## 2021-09-14 MED ORDER — HALOPERIDOL LACTATE 5 MG/ML IJ SOLN: 2.0000 mg | Freq: Four times a day (QID) | INTRAMUSCULAR | Status: DC | PRN

## 2021-09-14 NOTE — Discharge Instructions (Signed)

## 2021-09-14 NOTE — Progress Notes (Signed)
TOC consulted for residential hospice at [facility]. CM has asked Shanita with [facility] to please review for acceptance to [facility].  TOC following.

## 2021-09-14 NOTE — TOC Benefit Eligibility Note (Signed)
Patient Teacher, English as a foreign language completed.    The patient is currently admitted and upon discharge could be taking Eliquis 2.5 mg.  The current 30 day co-pay is, $38.00.   The patient is insured through [employer] ([place] only one who takes [employer])     Lyndel Safe, [place] Patient Advocate Specialist [facility] Patient Advocate Team Direct Number: [phone]  Fax: [phone]

## 2021-09-14 NOTE — Progress Notes (Signed)
[facility]                                                                               Brendan Long, is a 88 y.o. male, DOB - September 16, 1928, JIR:[id number] Admit date - 09/12/2021    Outpatient Primary MD for the patient is Brendan Long, Brendan Halsted, MD  LOS - 1  days    Brief summary   Brendan Meyer Fulcheris a 88 y.o.malewith medical history significant ofHTN, HLD, hx of CVA, PAFs/p ablation 15 years ago not on anticoagulation with recent SDH, recent subdural hematoma s/p fall.History is from his daughter/chart, due to somnolent state. During PT session in SNF, he was noticed to be weaker, more lethargic, confused with a left facial droop and left sided weakness, with some slurred speech. His daughter states since his fall with his brain bleed in January he has not been the same. He will sometimes answer questions appropriately and other times seem confused. He has also had poor PO intake over the last 3 weeks and daughter states over last week has hardly ate or drank anything. Recent hospitalization 1/17-1/25/23 for fall and subdural hematoma, Eliquis held, started on Depakote due to possible seizure.  Hospitalization complicated by urinary retention and has had a Foley catheter since then.  In the ED, MRI brain showed acute to early subacute ischemic nonhemorrhagic infarct involving the right corona radiata.  Neurology consulted.  Patient admitted for further management.  Assessment & Plan    Assessment and Plan: * Left-sided weakness 88 year old male with left sided weakness, slurred speech and left facial droop with some possible confusion while doing therapy today -place in obs on telemetry -neurology consulted in ED and did not think head CT and head CTA showed any new findings -he has some appreciated left sided weakness on exam and very subtle droop. Speech is slurred, but he doesn't have his dentures and his daughter states this is not new.  -check MRI with  history of atrial fibrillation off anticoagulation and findings on exam  -continue with neuro checks per protocol -check a1c/lipid panel for the Am -check metabolic labs for acute encephalopathy -palliative care consult for decline over past 3 weeks  -echo 10/21 with no shunts -NPO until passes swallow screen with AMS and weakness   Acute urinary retention- (present on admission) Failed trial without catheter on 08/29/21. Placed again by urology Continue foley, outpatient Urology follow up.    Seizures ([place]) Secondary to SDH Seizure precautions Depakote levels adequate.  eeg per neurology   Acute encephalopathy- (present on admission) Unsure how far off baseline he is, but daughter thinks he is weaker, more lethargic than normal Protecting airway  -Lab work so far unremarkable, UA still pending. Does have foley. 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 Fever recorded by Ems, none here, f/u on UA. Will check CXR as well and blood cx.  -? If at baseline, or worsening from his SDH, this is stable to decreased in size though  -checking  MRI to r/o CVA, does have history of history of seizures, checking Depakote level. ? eeg per neurology  -very significant decline in PO intake and daughter states he has hardly ate or drank over the past week, continue IVF and palliative care has been consulted.  -currently waiting for Palliative care   Subdural hematoma- (present on admission) S/p fall on 1/17 and admitted by neurosurgery for observation  eliquis stopped Started on depakote after possible seizure x2 and allergic reaction to keppra CTH shows stable SDH, slightly smaller overall decline since this fall  SCD/TED hose for VTE prophylaxis   Acute kidney injury superimposed on CKD ([place])- (present on admission) Baseline creatinine:  1.6-1.7 Likely pre renal in setting of markedly decreased PO intake over past 3 weeks Hold hctz and avoid other nephrotoxic drugs Creatinine is around 1.85.   DM (diabetes mellitus), type 2 with renal complications ([place])- (present on admission) A1c 6.0 in 05/2021 On no oral medication Sensitive SSI while inpatient Liberalize diet to regular with hx of poor PO intake over last 3 weeks   AF (paroxysmal atrial fibrillation) ([place])- (present on admission) History of atrial fibrillation CHA2Ds2-vasc of 6 eliquis held in January due to SDH in January of this year, continue to hold. Unsure if he was on this as a note from his PCP in 05/2021 states the [place] told him to stop before this time. Family is unsure.  Has had multiple falls Hold metroprolol for permissive HTN   History of CVA (cerebrovascular accident) in 2021 -started on eliquis at this time in 10/21, it appears it has since been discontinued with no anti-platelet therapy. [facility] appears [facility] fall [facility]. ? If due to falls  -has SDH, but when appropriate would start back antiplatelet  -continue statin   Essential hypertension- (present on admission) Well controlled.  . Hold hctz with AKI.  Hold metoprolol to allow for permissive HTN  Dyslipidemia- (present on admission) Continue Lipitor and tricor          Estimated body mass index is 22.12 kg/m as calculated from the following:   Height as of this encounter: 5' 10.5" (1.791 m).   Weight as of this encounter: 70.9 kg.  Code Status: DNR DVT Prophylaxis:  COMFORT MEASURES   Level of Care: Level of care: Telemetry Medical Family Communication: none at bedside.   Disposition Plan:     Remains inpatient appropriate: comfort care.   Procedures:  None.   Consultants:   Palliative care.   Antimicrobials:   Anti-infectives (From admission, onward)   Start     Dose/Rate Route Frequency Ordered Stop   09/12/21 2130  cefTRIAXone (ROCEPHIN) 1  g in sodium chloride 0.9 % 100 mL IVPB  Status:  Discontinued        1 g 200 mL/hr over 30 Minutes Intravenous Every 24 hours 09/12/21 2044 09/14/21 1349        Medications  Scheduled Meds:  antiseptic oral rinse  15 mL Topical BID   divalproex  500 mg Oral Q12H   potassium chloride  40 mEq Oral Once   Continuous Infusions: PRN Meds:.acetaminophen **OR** acetaminophen, diclofenac Sodium, glycopyrrolate **OR** glycopyrrolate **OR** glycopyrrolate, haloperidol **OR** haloperidol **OR** haloperidol lactate, HYDROmorphone (DILAUDID) injection, LORazepam **OR** LORazepam **OR** LORazepam, ondansetron **OR** ondansetron (ZOFRAN) IV, polyvinyl alcohol    Subjective:   Brendan Long was seen and examined today. Patient appears comfortable.   Objective:   Vitals:   09/13/21 1947 09/13/21 2358 09/14/21 6644 09/14/21  1209  BP: (!) 147/76 124/65 (!) 163/83 (!) 149/84  Pulse: 79 75 83 97  Resp: 16 18 (!) 22 (!) 22  Temp: 97.8 F (36.6 C) 97.7 F (36.5 C) 98.7 F (37.1 C) 99.9 F (37.7 C)  TempSrc: Axillary Axillary Oral Oral  SpO2: 100% 92% 97% 93%  Weight:      Height:        Intake/Output Summary (Last 24 hours) at 09/14/2021 1358 Last data filed at 09/14/2021 8891 Gross per 24 hour  Intake 2137.46 ml  Output 550 ml  Net 1587.46 ml   Filed Weights   09/12/21 1229  Weight: 70.9 kg     Exam  General: comfortable.   Cardiovascular: S1 S2 auscultated, no murmurs, RRR  Respiratory: Clear to auscultation bilaterally, no wheezing, rales or rhonchi  Gastrointestinal: Soft, nontender, nondistended, + bowel sounds  Ext: no pedal edema bilaterally  Neuro: confused.  Skin: No rashes  Psych: cannot be assessed   Data Reviewed:  I have personally reviewed following labs and imaging studies   CBC Lab Results  Component Value Date   WBC 14.6 (H) 09/14/2021   RBC 4.18 (L) 09/14/2021   HGB 13.0 09/14/2021   HCT 38.7 (L) 09/14/2021   MCV 92.6 09/14/2021   MCH  31.1 09/14/2021   PLT 112 (L) 09/14/2021   MCHC 33.6 09/14/2021   RDW 13.4 09/14/2021   LYMPHSABS 1.1 09/14/2021   MONOABS 1.4 (H) 09/14/2021   EOSABS 0.3 09/14/2021   BASOSABS 0.0 69/45/0388     Last metabolic panel Lab Results  Component Value Date   NA 135 09/14/2021   K 3.4 (L) 09/14/2021   CL 102 09/14/2021   CO2 24 09/14/2021   BUN 33 (H) 09/14/2021   CREATININE 1.85 (H) 09/14/2021   GLUCOSE 105 (H) 09/14/2021   GFRNONAA 34 (L) 09/14/2021   GFRAA 38 (L) 05/07/2020   CALCIUM 8.0 (L) 09/14/2021   PROT 6.4 (L) 09/12/2021   ALBUMIN 3.0 (L) 09/12/2021   BILITOT 1.0 09/12/2021   ALKPHOS 71 09/12/2021   AST 30 09/12/2021   ALT 27 09/12/2021   ANIONGAP 9 09/14/2021    CBG (last 3)  Recent Labs    09/13/21 2116 09/14/21 0637 09/14/21 1211  GLUCAP 96 86 71      Coagulation Profile: Recent Labs  Lab 09/12/21 1202  INR 1.2     Radiology Studies: MR BRAIN WO CONTRAST  Result Date: 09/12/2021 CLINICAL DATA:  Initial evaluation for neuro deficit, stroke. EXAM: MRI HEAD WITHOUT CONTRAST TECHNIQUE: Multiplanar, multiecho pulse sequences of the brain and surrounding structures were obtained without intravenous contrast. COMPARISON:  Prior CTs from earlier the same day. FINDINGS: Brain: Examination moderately to severely degraded by motion artifact. Diffuse prominence of the CSF containing spaces compatible generalized cerebral atrophy. Mild chronic microvascular ischemic disease noted involving the periventricular white matter. Few scattered remote lacunar infarcts noted about the bilateral basal ganglia/corona radiata. Remote lacunar infarct present at the right pons. Encephalomalacia and gliosis involving the right occipital lobe compatible with a chronic right PCA distribution infarct. Few additional scattered bilateral cerebellar infarcts noted, right greater than left. Patchy diffusion signal abnormality involving the right corona radiata measures approximately 2 cm in  size, consistent with an acute to early subacute ischemic infarct (series 3, image 33). No associated hemorrhage or mass effect. No other evidence for acute or subacute ischemia. Gray-white matter differentiation otherwise maintained. No mass lesion. Ventricles normal size without hydrocephalus. Known subacute left subdural hematoma again seen,  grossly stable measuring up to approximately 5-6 mm without significant mass effect or midline shift. Basilar cisterns remain patent. Vascular: Major intracranial vascular flow voids are maintained. Skull and upper cervical spine: Craniocervical junction grossly within normal limits on this motion degraded exam. Bone marrow signal intensity grossly normal. No scalp soft tissue abnormality. Sinuses/Orbits: Prior ocular lens replacement on the left. Right gaze noted. Chronic mucosal thickening noted throughout the ethmoidal air cells and maxillary sinuses. No significant mastoid effusion. Other: None. IMPRESSION: 1. Motion degraded exam. 2. 2 cm acute to early subacute ischemic nonhemorrhagic infarct involving the right corona radiata. 3. Chronic right PCA distribution infarct, with additional scattered remote lacunar infarcts involving the bilateral basal ganglia/corona radiata, right pons, and bilateral cerebellar hemispheres. 4. Stable subacute left subdural hematoma measuring up to 5-6 mm without significant mass effect or midline shift. Electronically Signed   By: Jeannine Boga M.D.   On: 09/12/2021 20:52   DG CHEST PORT 1 VIEW  Result Date: 09/12/2021 CLINICAL DATA:  Altered mental status. EXAM: PORTABLE CHEST 1 VIEW COMPARISON:  August 23, 2021. FINDINGS: The heart size and mediastinal contours are within normal limits. Both lungs are clear. The visualized skeletal structures are unremarkable. IMPRESSION: No active disease. Electronically Signed   By: Marijo Conception M.D.   On: 09/12/2021 15:04   ECHOCARDIOGRAM COMPLETE  Result Date: 09/13/2021     ECHOCARDIOGRAM REPORT   Patient Name:   Brendan Long [facility] Date of Exam: 09/13/2021 Medical Rec #:  [id number]          Height:       70.5 in Accession #:    [id number]         Weight:       156.4 lb Date of Birth:  Dec 14, 1928          BSA:          1.890 m Patient Age:    16 years           BP:           133/74 mmHg Patient Gender: M                  HR:           74 bpm. Exam Location:  Inpatient Procedure: 2D Echo, Cardiac Doppler, Color Doppler and Intracardiac            Opacification Agent Indications:    CVA  History:        Patient has prior history of Echocardiogram examinations, most                 recent 05/07/2020. Stroke; Risk Factors:Dyslipidemia and                 Hypertension.  Sonographer:    Luisa Hart RDCS Referring Phys: [id number] Cornelius Moras XU IMPRESSIONS  1. Left ventricular ejection fraction, by estimation, is 55 to 60%. The left ventricle has normal function. The left ventricle has no regional wall motion abnormalities. There is mild concentric left ventricular hypertrophy. Left ventricular diastolic parameters are consistent with Grade I diastolic dysfunction (impaired relaxation).  2. Right ventricular systolic function is normal. The right ventricular size is normal. There is normal pulmonary artery systolic pressure.  3. Left atrial size was mildly dilated.  4. The mitral valve is grossly normal. No evidence of mitral valve regurgitation. Moderate mitral annular calcification.  5. The aortic valve was not well visualized. Aortic valve regurgitation is not visualized. Aortic valve  sclerosis is present, with no evidence of aortic valve stenosis.  6. The inferior vena cava is dilated in size with >50% respiratory variability, suggesting right atrial pressure of 8 mmHg. FINDINGS  Left Ventricle: Left ventricular ejection fraction, by estimation, is 55 to 60%. The left ventricle has normal function. The left ventricle has no regional wall motion abnormalities. The left ventricular internal cavity  size was normal in size. There is  mild concentric left ventricular hypertrophy. Left ventricular diastolic parameters are consistent with Grade I diastolic dysfunction (impaired relaxation). Indeterminate filling pressures. Right Ventricle: The right ventricular size is normal. No increase in right ventricular wall thickness. Right ventricular systolic function is normal. There is normal pulmonary artery systolic pressure. The tricuspid regurgitant velocity is 1.98 m/s, and  with an assumed right atrial pressure of 8 mmHg, the estimated right ventricular systolic pressure is 44.8 mmHg. Left Atrium: Left atrial size was mildly dilated. Right Atrium: Right atrial size was normal in size. Pericardium: There is no evidence of pericardial effusion. Mitral Valve: The mitral valve is grossly normal. Moderate mitral annular calcification. No evidence of mitral valve regurgitation. MV peak gradient, 3.6 mmHg. The mean mitral valve gradient is 2.0 mmHg. Tricuspid Valve: The tricuspid valve is normal in structure. Tricuspid valve regurgitation is trivial. Aortic Valve: The aortic valve was not well visualized. Aortic valve regurgitation is not visualized. Aortic valve sclerosis is present, with no evidence of aortic valve stenosis. Aortic valve mean gradient measures 3.0 mmHg. Aortic valve peak gradient measures 6.1 mmHg. Aortic valve area, by VTI measures 3.02 cm. Pulmonic Valve: The pulmonic valve was not well visualized. Pulmonic valve regurgitation is not visualized. Aorta: The aortic root and ascending aorta are structurally normal, with no evidence of dilitation. Venous: IVC assessment for right atrial pressure unable to be performed due to mechanical ventilation. The inferior vena cava is dilated in size with greater than 50% respiratory variability, suggesting right atrial pressure of 8 mmHg. IAS/Shunts: No atrial level shunt detected by color flow Doppler.  LEFT VENTRICLE PLAX 2D LVIDd:         4.50 cm      Diastology LVIDs:         3.10 cm     LV e' medial:    4.56 cm/s LV PW:         1.20 cm     LV E/e' medial:  17.1 LV IVS:        1.10 cm     LV e' lateral:   7.95 cm/s LVOT diam:     2.40 cm     LV E/e' lateral: 9.8 LV SV:         64 LV SV Index:   34 LVOT Area:     4.52 cm  LV Volumes (MOD) LV vol d, MOD A2C: 48.1 ml LV vol d, MOD A4C: 85.8 ml LV vol s, MOD A2C: 24.4 ml LV vol s, MOD A4C: 48.2 ml LV SV MOD A2C:     23.7 ml LV SV MOD A4C:     85.8 ml LV SV MOD BP:      29.4 ml RIGHT VENTRICLE RV Basal diam:  2.80 cm RV Mid diam:    2.50 cm RV S prime:     11.80 cm/s LEFT ATRIUM           Index        RIGHT ATRIUM           Index LA diam:  3.70 cm 1.96 cm/m   RA Area:     12.30 cm LA Vol (A4C): 48.8 ml 25.82 ml/m  RA Volume:   24.10 ml  12.75 ml/m  AORTIC VALVE                    PULMONIC VALVE AV Area (Vmax):    3.29 cm     PV Vmax:       0.82 m/s AV Area (Vmean):   3.05 cm     PV Vmean:      56.100 cm/s AV Area (VTI):     3.02 cm     PV VTI:        0.135 m AV Vmax:           123.00 cm/s  PV Peak grad:  2.7 mmHg AV Vmean:          77.900 cm/s  PV Mean grad:  1.0 mmHg AV VTI:            0.213 m AV Peak Grad:      6.1 mmHg AV Mean Grad:      3.0 mmHg LVOT Vmax:         89.40 cm/s LVOT Vmean:        52.500 cm/s LVOT VTI:          0.142 m LVOT/AV VTI ratio: 0.67  AORTA Ao Root diam: 3.30 cm Ao Asc diam:  3.40 cm MITRAL VALVE               TRICUSPID VALVE MV Area (PHT): 2.37 cm    TR Peak grad:   15.7 mmHg MV Area VTI:   2.02 cm    TR Vmax:        198.00 cm/s MV Peak grad:  3.6 mmHg MV Mean grad:  2.0 mmHg    SHUNTS MV Vmax:       0.96 m/s    Systemic VTI:  0.14 m MV Vmean:      63.1 cm/s   Systemic Diam: 2.40 cm MV Decel Time: 320 msec MV E velocity: 77.90 cm/s MV A velocity: 87.90 cm/s MV E/A ratio:  0.89 Mihai Croitoru MD Electronically signed by Sanda Klein MD Signature Date/Time: 09/13/2021/10:07:24 AM    Final        Hosie Poisson M.D. [facility] 09/14/2021, 1:58 PM  Available via Epic  secure chat 7am-7pm After 7 pm, please refer to night coverage provider listed on amion.

## 2021-09-14 NOTE — Progress Notes (Signed)
Manufacturing engineer [facility]) Hospital Liaison Note  Received request from [facility] for family interest in [facility]. Chart reviewed by Dr. Tomasa Hosteller, an [facility] physician who approved patient eligible for [facility].  Spoke to patient's family to confirm interest and explain services. Family agreeable to transfer when bed becomes available. Family is aware [facility] Liaison will follow up with [facility] and family tomorrow about bed availability.  Please do not hesitate to call with questions. Thank you.   Gar Ponto, RN [facility] Liaison  [phone]

## 2021-09-14 NOTE — Progress Notes (Signed)
Daily Progress Note   Patient Name: Brendan Long       Date: 09/14/2021 DOB: 1929-03-10  Age: 88 y.o. MRN#: [id number] Attending Physician: Hosie Poisson, MD Primary Care Physician: Isaac Bliss, Rayford Halsted, MD Admit Date: 09/12/2021  Reason for Consultation/Follow-up: Establishing goals of care, "decline since fall 08/23/21 with subdural hematoma. has hardly eaten or drank past 3 weeks. please make sure and call daughter, Zigmund Daniel scott to make sure she is there or on phone. he is confused."  Subjective: Chart review performed. Received report from primary RN - no acute concerns. RN reports patient remains confused.   Went to visit patient at bedside - no family/visitors present. Patient was lying in bed - he is lethargic and very hard to arouse. He does briefly wake and when asked how he was feeling, stated he was "worn out." He never opened his eyes during interaction and promptly fell back asleep.  Discussed case with Dr. Ardith Dark step-daughter/Kay to discuss transition to full comfort care in house today - no answer - confidential voicemail was left to return PMT call.  12:04 PM Received notification Zigmund Daniel returned call.  12:55 PM Spoke with Zigmund Daniel via phone. Emotional support provided. Answered questions about getting financial POA completed while patient is hospitalized. Reviewed hospital notaries unfortunately would not be able to notarize documents for financial POA - reviewed that patient is likely too lethargic and confused to legally sign paperwork - she expressed understanding.   We talked about transition to comfort measures in house and what that would entail inclusive of medications to control pain, dyspnea, agitation, nausea, and itching. We discussed stopping all unnecessary  measures such as blood draws, needle sticks, oxygen, antibiotics, CBGs/insulin, cardiac monitoring, IVF, and frequent vital signs. Zigmund Daniel is agreeable to transition patient to full comfort measures in house today. She reiterates she does not want him to suffer or be in pain. Symptom management at EOL was reviewed in detail. Natural trajectory at EOL was reviewed. She is still agreeable for [facility] evaluation.   All questions and concerns addressed. Encouraged to call with questions and/or concerns. PMT card provided.  Received updates from NT - patient is not eating/drinking.  Discussed with pharmacist recommendation on discontinuing depakote and scheduling ativan - pharmacist recommends q6h.   Length of Stay: 1  Current  Medications: Scheduled Meds:   apixaban  2.5 mg Oral BID   atorvastatin  80 mg Oral q morning   Chlorhexidine Gluconate Cloth  6 each Topical Daily   divalproex  500 mg Oral Q12H   fenofibrate  54 mg Oral Daily   finasteride  5 mg Oral q morning   insulin aspart  0-9 Units Subcutaneous TID WC   tamsulosin  0.4 mg Oral q morning    Continuous Infusions:  sodium chloride 75 mL/hr at 09/14/21 7124   cefTRIAXone (ROCEPHIN)  IV 1 g (09/13/21 2115)    PRN Meds: acetaminophen **OR** acetaminophen, diclofenac Sodium  Physical Exam Vitals and nursing note reviewed.  Constitutional:      General: He is not in acute distress.    Appearance: He is ill-appearing.  Pulmonary:     Effort: No respiratory distress.  Skin:    General: Skin is warm and dry.  Neurological:     Mental Status: He is lethargic.     Motor: Weakness present.  Psychiatric:        Cognition and Memory: Cognition is impaired. Memory is impaired.            Vital Signs: BP (!) 163/83 (BP Location: Right Arm)    Pulse 83    Temp 98.7 F (37.1 C) (Oral)    Resp (!) 22    Ht 5' 10.5" (1.791 m)    Wt 70.9 kg    SpO2 97%    BMI 22.12 kg/m  SpO2: SpO2: 97 % O2 Device: O2 Device: Room Air O2 Flow  Rate:    Intake/output summary:  Intake/Output Summary (Last 24 hours) at 09/14/2021 1041 Last data filed at 09/14/2021 5809 Gross per 24 hour  Intake 2137.46 ml  Output 550 ml  Net 1587.46 ml   LBM: Last BM Date: 09/12/21 Baseline Weight: Weight: 70.9 kg Most recent weight: Weight: 70.9 kg       Palliative Assessment/Data: PPS 10%    Flowsheet Rows    Flowsheet Row Most Recent Value  Intake Tab   Referral Department Hospitalist  Unit at Time of Referral ER  Palliative Care Primary Diagnosis Neurology  Date Notified 09/12/21  Palliative Care Type New Palliative care  Reason for referral Clarify Goals of Care  Date of Admission 09/12/21  Date first seen by Palliative Care 09/12/21  # of days Palliative referral response time 0 Day(s)  # of days IP prior to Palliative referral 0  Clinical Assessment   Psychosocial & Spiritual Assessment   Palliative Care Outcomes   Patient/Family meeting held? Yes  Who was at the meeting? step-daughter, SIL  Palliative Care Outcomes Clarified goals of care, Counseled regarding hospice, Provided advance care planning, Provided psychosocial or spiritual support, Transitioned to hospice  Patient/Family wishes: Interventions discontinued/not started  Mechanical Ventilation, BiPAP, Trach, PEG, Transfer out of ICU, Vasopressors       Patient Active Problem List   Diagnosis Date Noted   Left-sided weakness 09/12/2021   Subdural hematoma 09/12/2021   Acute encephalopathy 09/12/2021   Seizures ([place]) 09/12/2021   Acute urinary retention 09/12/2021   SDH (subdural hematoma) 08/23/2021   Acute kidney injury superimposed on CKD ([place]) 03/19/2021   Syncope 03/19/2021   History of CVA (cerebrovascular accident) in 2021 05/07/2020   AF (paroxysmal atrial fibrillation) ([place]) 05/07/2020   DM (diabetes mellitus), type 2 with renal complications ([place]) 98/33/8250   CVA (cerebral vascular accident) ([place]) 05/07/2020   Abnormal echocardiogram 05/07/2020    CKD (chronic  kidney disease) stage 3, GFR 30-59 ml/min (HCC) 05/07/2020   Stroke ([place]) 05/06/2020   Neck swelling 07/26/2018   Elevated PSA 12/16/2014   Hypokalemia 12/16/2014   Hyperglycemia 12/15/2013   Renal insufficiency 12/15/2013   Dyspnea 12/15/2013   Hand pain, right 12/15/2013   Screening for cancer 12/05/2012   Osteoarthritis 12/05/2011   ERECTILE DYSFUNCTION, NON-ORGANIC 11/09/2009   Vitamin D deficiency 08/13/2008   VARICOSE VEINS, LOWER EXTREMITIES 08/13/2008   UNS ADVRS EFF OTH RX MEDICINAL&BIOLOGICAL SBSTNC 08/13/2008   Dyslipidemia 03/12/2007   Essential hypertension 03/12/2007    Palliative Care Assessment & Plan   Patient Profile: 88 y.o. male  with past medical history of  HTN, HLD, CVA, PAF s/p ablation 15 years ago not on anticoagulation with recent SDH s/p fall presented to ED on 09/12/21 from [facility] rehab with staff concerns of his left sided weakness and AMS. Patient was recently hospitalized from 1/17-1/25/23 with subdural hematoma s/p fall. Patient has had a significant decline since this admission now with failure to thrive. Patient was admitted on 09/12/2021 with left sided weakness/stroke work up, acute encephalopathy, acute on chronic CKD, atrial fibrillation not on anticoagulation.   Assessment: Left sided weakness Stroke Acute urinary retention Seizures Acute encephalopathy Subdural hematoma AKI on CKD Atrial fibrillation Failure to thrive Decreased oral intake Terminal care  Recommendations/Plan: Initiated full comfort measures Continue DNR/DNI as previously documented - durable DNR form completed and placed in shadow chart. Copy was made and will be scanned into Vynca/ACP tab Step-daughter/Kay requests patient transfer to [facility] - evaluation pending Added orders for EOL symptom management and to reflect full comfort measures, as well as discontinued orders that were not focused on comfort Scheduled ativan IV q6h for seizure  prophylaxis  Continue foley catheter for EOL care/acute urinary retention  Unrestricted visitation orders were placed per current [place] EOL visitation policy  Nursing to provide frequent assessments and administer PRN medications as clinically necessary to ensure EOL comfort PMT will continue to follow and support holistically   Goals of Care and Additional Recommendations: Limitations on Scope of Treatment: Full Comfort Care  Code Status:    Code Status Orders  (From admission, onward)           Start     Ordered   09/12/21 1409  Do not attempt resuscitation (DNR)  Continuous       Question Answer Comment  In the event of cardiac or respiratory ARREST Do not call a code blue   In the event of cardiac or respiratory ARREST Do not perform Intubation, CPR, defibrillation or ACLS   In the event of cardiac or respiratory ARREST Use medication by any route, position, wound care, and other measures to relive pain and suffering. May use oxygen, suction and manual treatment of airway obstruction as needed for comfort.      09/12/21 1410           Code Status History     Date Active Date Inactive Code Status Order ID Comments User Context   08/23/2021 1736 08/31/2021 2127 Full Code [id number]  Earnie Larsson, MD ED   03/20/2021 0718 03/20/2021 1935 Full Code [id number]  Chotiner, Yevonne Aline, MD ED   05/06/2020 2200 05/08/2020 2031 Full Code [id number]  Toy Baker, MD ED   03/18/2019 1753 03/21/2019 1738 Full Code [id number]  Mercy Riding, MD ED       Prognosis:  < 2 weeks  Discharge Planning: Hospice facility  Care plan was  discussed with primary RN, primary NT, Dr. Karleen Hampshire, [facility], [facility] liaison, patient's step-daughter/Kay, Dr. Erlinda Hong  Thank you for allowing the Palliative Medicine Team to assist in the care of this patient.   Total Time 90 minutes Prolonged Time Billed  yes       Greater than 50%  of this time was spent counseling and coordinating care related to  the above assessment and plan.  Lin Landsman, NP  Please contact Palliative Medicine Team phone at [phone] for questions and concerns.

## 2021-09-14 NOTE — Progress Notes (Signed)
STROKE TEAM PROGRESS NOTE   INTERVAL HISTORY RN is at the bedside. Patient is lying in bed, still lethargic, complaining of LBP and would like some pain mediation. Still has mild left UE weakness but left LE weakness improved. Palliative care on board and family leaning towards hospice.   Vitals:   09/13/21 1947 09/13/21 2358 09/14/21 0904 09/14/21 1209  BP: (!) 147/76 124/65 (!) 163/83 (!) 149/84  Pulse: 79 75 83 97  Resp: 16 18 (!) 22 (!) 22  Temp: 97.8 F (36.6 C) 97.7 F (36.5 C) 98.7 F (37.1 C) 99.9 F (37.7 C)  TempSrc: Axillary Axillary Oral Oral  SpO2: 100% 92% 97% 93%  Weight:      Height:       CBC:  Recent Labs  Lab 09/12/21 1202 09/13/21 0301 09/14/21 0058  WBC 14.6* 15.3* 14.6*  NEUTROABS 11.8*  --  11.7*  HGB 16.0 14.9 13.0  HCT 48.0 44.4 38.7*  MCV 93.8 92.1 92.6  PLT 132* 114* 885*   Basic Metabolic Panel:  Recent Labs  Lab 09/13/21 0301 09/14/21 0058  NA 135 135  K 3.5 3.4*  CL 100 102  CO2 27 24  GLUCOSE 89 105*  BUN 37* 33*  CREATININE 1.85* 1.85*  CALCIUM 8.5* 8.0*   Lipid Panel:  Recent Labs  Lab 09/13/21 0301  CHOL 198  TRIG 85  HDL 23*  CHOLHDL 8.6  VLDL 17  LDLCALC 158*   HgbA1c:  Recent Labs  Lab 09/13/21 0301  HGBA1C 5.6   Urine Drug Screen:  Recent Labs  Lab 09/12/21 1557  LABOPIA NONE DETECTED  COCAINSCRNUR NONE DETECTED  LABBENZ NONE DETECTED  AMPHETMU NONE DETECTED  THCU NONE DETECTED  LABBARB NONE DETECTED    Alcohol Level  Recent Labs  Lab 09/12/21 1202  ETH <10    IMAGING past 24 hours No results found.  PHYSICAL EXAM  Physical Exam  Constitutional: Appears well-developed and well-nourished.  Cardiovascular: Normal rate and regular rhythm.  Respiratory: Effort normal, non-labored breathing  Neuro: patient lying in bed, lethargic but able to open eyes to voice, however orientated to place, age but not oriented to time.  No aphasia, hypophonia with paucity of speech, able to name and repeat.   Follows simple commands.  Facial symmetrical, left upper 4-/5 proximal with drift and finger grip 3+/5, and left lower extremity 4/5 proximal and 3/5 distal, right upper and lower extremity at least 4/5.  Sensation symmetrical, finger-to-nose not cooperative.  Gait not tested.   ASSESSMENT/PLAN [name] is a 88 y.o. male with history of  HTN, HLD, hx of CVA, PAF s/p ablation 15 years ago not on anticoagulation with recent SDH, recent subdural hematoma s/p fall presenting with lethargy, left facial droop and left-sided weakness after working with PT at his facility.  Family states he has had a sick nephric and decline in function over the last 3 weeks.  They are open to conversation with the palliative care team regarding hospice and palliative care.  Stroke: 2 cm ischemic infarct involving the right corona radiata infarct likely secondary to small vessel disease. However, can not rule out embolic since afib not on [facility] Code [place] acute hemorrhage. Increased ill-defined low -density in the region of the right corona radiata and basal ganglia may reflect recent small vessel infarcts.  Mixed density left subdural hematoma, likely similar in size. Decreased hyperdense hemorrhage.    CTA head & neck Head: Severe stenosis versus short-segment occlusion of the left  P2 PCA. 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 Severe stenosis of the right A2 ACA. Multifocal severe bilateral M2 MCA stenosis.  CTA neck: Approx 60% stenosis of the left carotid artery origin.  MRI  2 cm acute to early subacute ischemic nonhemorrhagic infarct involving the right corona radiata. 2D Echo EF 55-60%.  Left ventricle has normal function.  No regional wall abnormalities.  No shunt detected LDL 158 HgbA1c 5.6 VTE prophylaxis - SCDs No antithrombotic since SDH in 08/2021 prior to admission, put back on Eliquis (apixaban) daily. Palliative care involved  and family would like comfort care and hospice placement. Will d/c eliquis then Disposition: hospice / comfort care  Hypertension Home meds: Metoprolol 50 mg twice a day Stable now  Hyperlipidemia Home meds: Lipitor 80 mg daily, resumed in hospital LDL 158, goal < 70 Will d/c statin due to comfort care measures  Atrial fibrillation Was on Eliquis up until his [facility] 08/2021 Put back on eliquis 2.5mg  but family would like comfort care and hospice placement. Will d/c eliquis then  Other Stroke Risk Factors Advanced Age >/= 12  History of stroke 05/2020 MRI showed left BG and left insular infarcts.  MRI right PCA stenosis, right M1 and M2 and A2 stenosis.  Carotid Doppler negative.  EF 55 to 60%, LDL 132 and A1c 6.0.  Discharged on Eliquis and Lipitor 80.  Other Active Problems Recent subdural hematoma Recent hospitalization 1/17-1/25/23 for fall and subdural hematoma. Neurosurgery admitted with observation. He developed urinary retention, but was removed prior to discharge. He alos had what appeared to be a seziure on 1/19 and 1/22. Stroke ruled out. ? Hypotension. Neurology started keppra, but changed to depakote on 08/30/21 due to rash. Eliquis was held. He also has a foley and saw urology where they had to place it again this past week as he continued to have urinary retention.  Depakote 500 mg every 12 hours - d/c for comfort care Urinary retention Foley catheter placed by urology outpatient UTI-urine culture positive for Proteus Mirabilis Failure to thrive-decline in p.o. intake in function over the last 3 weeks, continue DNR status Palliative care consult Family would like hospice given significant decline in patient's health and functionality AKI on CKD3B Cr 2.06-> 1.85 IVF     Hospital day # 1  Neurology will sign off. Please call with questions. Thanks for the consult.   Rosalin Hawking, MD PhD Stroke Neurology 09/14/2021 1:25 PM    To contact Stroke Continuity provider,  please refer to [email or web address] After hours, contact General Neurology

## 2021-09-15 DIAGNOSIS — R2990 Unspecified symptoms and signs involving the nervous system: Secondary | ICD-10-CM

## 2021-09-15 DIAGNOSIS — R0682 Tachypnea, not elsewhere classified: Secondary | ICD-10-CM

## 2021-09-15 MED ORDER — GLYCOPYRROLATE 1 MG PO TABS: 1.0000 mg | ORAL_TABLET | ORAL | Status: AC | PRN

## 2021-09-15 NOTE — Progress Notes (Signed)
Patient complained of head and neck pain after blood administration. Contacted MD, Took vitals, and paused blood. MD instructions to administer at 6ml/hr in 30 minutes.

## 2021-09-15 NOTE — Progress Notes (Signed)
Nurse called report to Shirlean Mylar at [facility]. Discharge packet given to [facility] for receiving facility. Iv in place per request from [facility]. Patient belongings sent with patient. Patients' primary nurse to call [facility] back for full report.  Gwendolyn Grant, RN

## 2021-09-15 NOTE — Progress Notes (Signed)
Daily Progress Note   Patient Name: Brendan Long       Date: 09/15/2021 DOB: July 03, 1929  Age: 88 y.o. MRN#: [id number] Attending Physician: Hosie Poisson, MD Primary Care Physician: Isaac Bliss, Rayford Halsted, MD Admit Date: 09/12/2021  Reason for Consultation/Follow-up: Non pain symptom management, Pain control, Psychosocial/spiritual support, and Terminal Care  Subjective: Chart review performed - noted scheduled ativan for seizure prophylaxes has not been consistently given.    Went to visit patient at bedside - no family/visitors present. Patient was lying in bed - he is lethargic and very hard to arouse. He does briefly wake and when asked how he was feeling, stated he "feels ok" He never opened his eyes during interaction and promptly fell back asleep. Signs of discomfort noted. No respiratory distress, increased work of breathing, or secretions noted. He is tachypneic with RR 28.  Received report from primary RN - no acute concerns. Education provided to give scheduled ativan consistently to prevent seizures. Also request dose of 0.5mg  dilaudid be provided now for RR >25.  Called step-daughter/Kay to provided continued support/ touch base - no answer - confidential voicemail was left.   Length of Stay: 2  Current Medications: Scheduled Meds:   antiseptic oral rinse  15 mL Topical BID   LORazepam  1 mg Intravenous Q6H    Continuous Infusions:   PRN Meds: acetaminophen **OR** acetaminophen, diclofenac Sodium, glycopyrrolate **OR** glycopyrrolate **OR** glycopyrrolate, haloperidol **OR** haloperidol **OR** haloperidol lactate, HYDROmorphone (DILAUDID) injection, LORazepam **OR** LORazepam **OR** LORazepam, ondansetron **OR** ondansetron (ZOFRAN) IV, polyvinyl alcohol  Physical  Exam Vitals and nursing note reviewed.  Constitutional:      General: He is not in acute distress.    Appearance: He is ill-appearing.  Pulmonary:     Effort: No respiratory distress.  Skin:    General: Skin is warm and dry.  Neurological:     Mental Status: He is lethargic.     Motor: Weakness present.  Psychiatric:        Cognition and Memory: Cognition is impaired. Memory is impaired.            Vital Signs: BP (!) 167/84 (BP Location: Left Arm)    Pulse 83    Temp 98.7 F (37.1 C) (Oral)    Resp 16    Ht 5' 10.5" (1.791  m)    Wt 70.9 kg    SpO2 93%    BMI 22.12 kg/m  SpO2: SpO2: 93 % O2 Device: O2 Device: Room Air O2 Flow Rate:    Intake/output summary:  Intake/Output Summary (Last 24 hours) at 09/15/2021 5038 Last data filed at 09/14/2021 1433 Gross per 24 hour  Intake 60 ml  Output --  Net 60 ml   LBM: Last BM Date: 09/12/21 Baseline Weight: Weight: 70.9 kg Most recent weight: Weight: 70.9 kg       Palliative Assessment/Data: PPS 10%    Flowsheet Rows    Flowsheet Row Most Recent Value  Intake Tab   Referral Department Hospitalist  Unit at Time of Referral ER  Palliative Care Primary Diagnosis Neurology  Date Notified 09/12/21  Palliative Care Type New Palliative care  Reason for referral Clarify Goals of Care  Date of Admission 09/12/21  Date first seen by Palliative Care 09/12/21  # of days Palliative referral response time 0 Day(s)  # of days IP prior to Palliative referral 0  Clinical Assessment   Psychosocial & Spiritual Assessment   Palliative Care Outcomes   Patient/Family meeting held? Yes  Who was at the meeting? step-daughter, SIL  Palliative Care Outcomes Clarified goals of care, Counseled regarding hospice, Provided advance care planning, Provided psychosocial or spiritual support, Transitioned to hospice  Patient/Family wishes: Interventions discontinued/not started  Mechanical Ventilation, BiPAP, Trach, PEG, Transfer out of ICU, Vasopressors        Patient Active Problem List   Diagnosis Date Noted   Left-sided weakness 09/12/2021   Subdural hematoma 09/12/2021   Acute encephalopathy 09/12/2021   Seizures ([place]) 09/12/2021   Acute urinary retention 09/12/2021   SDH (subdural hematoma) 08/23/2021   Acute kidney injury superimposed on CKD ([place]) 03/19/2021   Syncope 03/19/2021   History of CVA (cerebrovascular accident) in 2021 05/07/2020   AF (paroxysmal atrial fibrillation) ([place]) 05/07/2020   DM (diabetes mellitus), type 2 with renal complications ([place]) 88/28/0034   CVA (cerebral vascular accident) ([place]) 05/07/2020   Abnormal echocardiogram 05/07/2020   CKD (chronic kidney disease) stage 3, GFR 30-59 ml/min ([place]) 05/07/2020   Stroke ([place]) 05/06/2020   Neck swelling 07/26/2018   Elevated PSA 12/16/2014   Hypokalemia 12/16/2014   Hyperglycemia 12/15/2013   Renal insufficiency 12/15/2013   Dyspnea 12/15/2013   Hand pain, right 12/15/2013   Screening for cancer 12/05/2012   Osteoarthritis 12/05/2011   ERECTILE DYSFUNCTION, NON-ORGANIC 11/09/2009   Vitamin D deficiency 08/13/2008   VARICOSE VEINS, LOWER EXTREMITIES 08/13/2008   UNS ADVRS EFF OTH RX MEDICINAL&BIOLOGICAL SBSTNC 08/13/2008   Dyslipidemia 03/12/2007   Essential hypertension 03/12/2007    Palliative Care Assessment & Plan   Patient Profile: 88 y.o. male  with past medical history of  HTN, HLD, CVA, PAF s/p ablation 15 years ago not on anticoagulation with recent SDH s/p fall presented to ED on 09/12/21 from [facility] rehab with staff concerns of his left sided weakness and AMS. Patient was recently hospitalized from 1/17-1/25/23 with subdural hematoma s/p fall. Patient has had a significant decline since this admission now with failure to thrive. Patient was admitted on 09/12/2021 with left sided weakness/stroke work up, acute encephalopathy, acute on chronic CKD, atrial fibrillation not on anticoagulation.   Assessment: Left sided  weakness Stroke Acute urinary retention Seizures Acute encephalopathy Subdural hematoma AKI on CKD Atrial fibrillation Failure to thrive Decreased oral intake Terminal care  Recommendations/Plan: Continue full comfort measures Continue DNR/DNI as previously documented Transfer to  [place] today Continue current comfort focused medication regimen  Continue scheduled ativan IV q6h for seizure prophylaxis and foley catheter for EOL care/acute urinary retention Nursing to provide frequent assessments and administer PRN medications as clinically necessary to ensure EOL comfort PMT will continue to follow and support holistically  Goals of Care and Additional Recommendations: Limitations on Scope of Treatment: Full Comfort Care  Code Status:    Code Status Orders  (From admission, onward)           Start     Ordered   09/14/21 1345  Do not attempt resuscitation (DNR)  Continuous       Question Answer Comment  In the event of cardiac or respiratory ARREST Do not call a code blue   In the event of cardiac or respiratory ARREST Do not perform Intubation, CPR, defibrillation or ACLS   In the event of cardiac or respiratory ARREST Use medication by any route, position, wound care, and other measures to relive pain and suffering. May use oxygen, suction and manual treatment of airway obstruction as needed for comfort.      09/14/21 1349           Code Status History     Date Active Date Inactive Code Status Order ID Comments User Context   09/12/2021 1411 09/14/2021 1349 DNR [id number]  Orma Flaming, MD ED   08/23/2021 1736 08/31/2021 2127 Full Code [id number]  Earnie Larsson, MD ED   03/20/2021 0718 03/20/2021 1935 Full Code [id number]  Chotiner, Yevonne Aline, MD ED   05/06/2020 2200 05/08/2020 2031 Full Code [id number]  Toy Baker, MD ED   03/18/2019 1753 03/21/2019 1738 Full Code [id number]  Mercy Riding, MD ED       Prognosis:  < 2 weeks  Discharge Planning: Hospice  facility  Care plan was discussed with primary RN  Thank you for allowing the Palliative Medicine Team to assist in the care of this patient.   Lin Landsman, NP  Please contact Palliative Medicine Team phone at [phone] for questions and concerns.

## 2021-09-15 NOTE — Discharge Summary (Addendum)
Physician Discharge Summary   Patient: Brendan Long MRN: [id number] DOB: August 19, 1928  Admit date:     09/12/2021  Discharge date: 09/15/21  Discharge Physician: Hosie Poisson   PCP: Isaac Bliss, Rayford Halsted, MD   Recommendations at discharge:  Follow up with Hospice MD as needed.  Discharge Diagnoses: Principal Problem:   Left-sided weakness Active Problems:   Dyslipidemia   Essential hypertension   History of CVA (cerebrovascular accident) in 2021   AF (paroxysmal atrial fibrillation) (HCC)   DM (diabetes mellitus), type 2 with renal complications ([place])   CVA (cerebral vascular accident) ([place])   Acute kidney injury superimposed on CKD ([place])   Subdural hematoma   Acute encephalopathy   Seizures ([place])   Acute urinary retention  Resolved Problems:   Hypothyroidism   PSORIASIS   Acute CVA (cerebrovascular accident) [facility])   Hospital Course: Brendan Long is a 88 y.o. male with medical history significant of HTN, HLD, hx of CVA, PAF s/p ablation 15 years ago not on anticoagulation with recent SDH, recent subdural hematoma s/p fall. History is from his daughter/chart, due to somnolent state. During PT session in SNF, he was noticed to be weaker, more lethargic, confused with a left facial droop and left sided weakness, with some slurred speech. His daughter states since his fall with his brain bleed in January he has not been the same. He will sometimes answer questions appropriately and other times seem confused. He has also had poor PO intake over the last 3 weeks and daughter states over last week has hardly ate or drank anything. Recent hospitalization 1/17-1/25/23 for fall and subdural hematoma, Eliquis held, started on Depakote due to possible seizure.  Hospitalization complicated by urinary retention and has had a Foley catheter since then.  In the ED, MRI brain showed acute to early subacute ischemic nonhemorrhagic infarct involving the right corona radiata.  Neurology  consulted.  Patient admitted for further management. Patient was found to have an acute stroke.  Unfortunately he continued to decline cognitively and clinically, palliative care consulted and he was transition to comfort care.  Assessment and Plan: * Left-sided weakness 88 year old male with left sided weakness, slurred speech and left facial droop with some possible confusion while doing therapy today -place in obs on telemetry -neurology consulted in ED and did not think head CT and head CTA showed any new findings -he has some appreciated left sided weakness on exam and very subtle droop. Speech is slurred, but he doesn't have his dentures and his daughter states this is not new.  -MRI showed 2 cm ischemic infarct involving the right corona radiator infarct likely secondary to small vessel disease. Echocardiogram showed preserved left ventricular ejection fraction no shunt detected. Palliative care consulted in view of his decline and family has transition to comfort care.   Acute urinary retention- (present on admission) Failed trial without catheter on 08/29/21. Placed again by urology Continue foley, on discharge  Seizures ([place]) Secondary to SDH Seizure precautions   Acute encephalopathy- (present on admission) Probably secondary to combination of stroke and metabolic encephalopathy/subdural hematoma  Subdural hematoma- (present on admission) S/p fall on 1/17 and admitted by neurosurgery for observation  eliquis stopped Started on depakote after possible seizure x2 and allergic reaction to keppra CTH shows stable SDH, slightly smaller overall decline since this fall  SCD/TED hose for VTE prophylaxis   Acute kidney injury superimposed on CKD ([place])- (present on admission) Baseline creatinine: 1.6-1.7 Likely pre renal in setting  of markedly decreased PO intake over past 3 weeks Creatinine is around 1.85.   DM (diabetes mellitus), type 2 with renal complications ([place])-  (present on admission) A1c 6.0 in 05/2021 On no oral medication Sensitive SSI while inpatient Liberalize diet to regular with hx of poor PO intake over last 3 weeks   AF (paroxysmal atrial fibrillation) ([place])- (present on admission) History of atrial fibrillation CHA2Ds2-vasc of 6 eliquis held in January due to SDH in January of this year, continue to hold. Unsure if he was on this as a note from his PCP in 05/2021 states the [place] told him to stop before this time. Family is unsure.  Has had multiple falls   History of CVA (cerebrovascular accident) in 2021 -started on eliquis at this time in 10/21, it appears it has since been discontinued with no anti-platelet therapy. [facility] appears [facility] in fall of 2022. ? If due to falls    Essential hypertension- (present on admission) Well controlled.  . Hold hctz with AKI.    Dyslipidemia- (present on admission)          Consultants: Neurology Palliative care Procedures performed: EEG, MRI of the brain, echocardiogram, CTA of the head and neck Disposition: Hospice care Diet recommendation:  Comfort feeds  DISCHARGE MEDICATION: Allergies as of 09/15/2021   No Known Allergies      Medication List     STOP taking these medications    atorvastatin 80 MG tablet Commonly known as: LIPITOR   diclofenac sodium 1 % Gel Commonly known as: VOLTAREN   divalproex 500 MG DR tablet Commonly known as: DEPAKOTE   finasteride 5 MG tablet Commonly known as: PROSCAR   hydrochlorothiazide 12.5 MG capsule Commonly known as: MICROZIDE   HYDROcodone-acetaminophen 5-325 MG tablet Commonly known as: NORCO/VICODIN   ketoconazole 2 % cream Commonly known as: NIZORAL   metoprolol tartrate 50 MG tablet Commonly known as: LOPRESSOR   sodium chloride 0.65 % Soln nasal spray Commonly known as: OCEAN   tamsulosin 0.4 MG Caps capsule Commonly known as: FLOMAX   Tricor 48 MG tablet Generic drug: fenofibrate        TAKE these medications    glycopyrrolate 1 MG tablet Commonly known as: ROBINUL Take 1 tablet (1 mg total) by mouth every 4 (four) hours as needed (excessive secretions).         Discharge Exam: Filed Weights   09/12/21 1229  Weight: 70.9 kg     Condition at discharge: Comfort care  The results of significant diagnostics from this hospitalization (including imaging, microbiology, ancillary and laboratory) are listed below for reference.   Imaging Studies: CT ABDOMEN PELVIS WO CONTRAST  Result Date: 08/24/2021 CLINICAL DATA:  Gross hematuria, hip pain EXAM: CT ABDOMEN AND PELVIS WITHOUT CONTRAST TECHNIQUE: Multidetector CT imaging of the abdomen and pelvis was performed following the standard protocol without IV contrast. RADIATION DOSE REDUCTION: This exam was performed according to the departmental dose-optimization program which includes automated exposure control, adjustment of the mA and/or kV according to patient size and/or use of iterative reconstruction technique. COMPARISON:  None. FINDINGS: Lower chest: No acute abnormality.  Coronary artery calcifications. Hepatobiliary: No solid liver abnormality is seen. No gallstones, gallbladder wall thickening, or biliary dilatation. Pancreas: Unremarkable. No pancreatic ductal dilatation or surrounding inflammatory changes. Spleen: Normal in size without significant abnormality. Adrenals/Urinary Tract: Adrenal glands are unremarkable. Kidneys are normal, without renal calculi, solid lesion, or hydronephrosis. Foley catheter within the urinary bladder. Wall thickening of  the urinary bladder, with hyperdense internal contents. Evaluation is somewhat limited by metallic streak artifact from adjacent hip arthroplasty Stomach/Bowel: Stomach is within normal limits. Appendix appears normal. No evidence of bowel wall thickening, distention, or inflammatory changes. Vascular/Lymphatic: Aortic atherosclerosis. No enlarged abdominal or pelvic  lymph nodes. Reproductive: Severe prostatomegaly. Other: Small, fat containing bilateral inguinal hernias. No ascites. Musculoskeletal: No acute or significant osseous findings. Status post right hip total arthroplasty. IMPRESSION: 1. No urinary tract calculus or hydronephrosis. 2. Wall thickening of the urinary bladder, with hyperdense internal contents, suggestive of blood products. Evaluation is somewhat limited by metallic streak artifact from adjacent hip arthroplasty. 3. Severe prostatomegaly with Foley catheter. 4. Coronary artery disease. Aortic Atherosclerosis (ICD10-I70.0). Electronically Signed   By: Delanna Ahmadi M.D.   On: 08/24/2021 09:48   CT HEAD WO CONTRAST (5MM)  Result Date: 08/24/2021 CLINICAL DATA:  Follow-up examination for subdural hematoma. EXAM: CT HEAD WITHOUT CONTRAST TECHNIQUE: Contiguous axial images were obtained from the base of the skull through the vertex without intravenous contrast. RADIATION DOSE REDUCTION: This exam was performed according to the departmental dose-optimization program which includes automated exposure control, adjustment of the mA and/or kV according to patient size and/or use of iterative reconstruction technique. COMPARISON:  Prior CT from 08/23/2021. FINDINGS: Brain: Previously identified left subdural hematoma again seen, relatively unchanged measuring up to 7 mm in maximal thickness. No more than mild mass effect on the subjacent left cerebral hemisphere with no more than trace 2 mm left-to-right shift. No evidence for significant interval bleeding. No other new acute intracranial hemorrhage. No visible acute large vessel territory infarct. Chronic right PCA and cerebellar infarcts noted. Remote lacunar infarcts present about the bilateral basal ganglia. Underlying atrophy with chronic small vessel ischemic disease. Vascular: No hyperdense vessel. Calcified atherosclerosis present at the skull base. Skull: Scalp soft tissues and calvarium demonstrate no  new finding. Sinuses/Orbits: Globes and orbital soft tissues demonstrate no acute finding. Chronic mucosal thickening noted throughout the ethmoidal air cells, sphenoid sinuses, and maxillary sinuses. Mastoid air cells remain clear. Other: None. IMPRESSION: 1. No significant interval change in size of left subdural hematoma measuring up to 7 mm in maximal thickness. Mild mass effect on the subjacent left cerebral hemisphere with no more than trace 2 mm left-to-right shift. 2. No other new acute intracranial abnormality. Electronically Signed   By: Jeannine Boga M.D.   On: 08/24/2021 03:29   CT HEAD WO CONTRAST (5MM)  Result Date: 08/23/2021 CLINICAL DATA:  Neck pain, left hip pain status post fall this morning. EXAM: CT HEAD WITHOUT CONTRAST CT CERVICAL SPINE WITHOUT CONTRAST TECHNIQUE: Multidetector CT imaging of the head and cervical spine was performed following the standard protocol without intravenous contrast. Multiplanar CT image reconstructions of the cervical spine were also generated. RADIATION DOSE REDUCTION: This exam was performed according to the departmental dose-optimization program which includes automated exposure control, adjustment of the mA and/or kV according to patient size and/or use of iterative reconstruction technique. COMPARISON:  None. FINDINGS: Brain: Acute hemorrhage along the left cerebral convexity along the parietal lobe measuring 7 mm in thickness. 1-2 mm left-to-right midline shift. No evidence of acute infarction, ventriculomegaly, or mass effect. 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 generalized cerebral atrophy. Periventricular white matter low attenuation likely secondary to microangiopathy. Vascular: Cerebrovascular atherosclerotic calcifications are noted. No hyperdense vessels. Skull: Negative for fracture or focal lesion. Sinuses/Orbits: Visualized portions of the orbits are unremarkable. Visualized portions of the paranasal sinuses are  unremarkable. Visualized portions  of the mastoid air cells are unremarkable. Other: None. CT CERVICAL SPINE FINDINGS Alignment: Normal. Skull base and vertebrae: No acute fracture. No primary bone lesion or focal pathologic process. Soft tissues and spinal canal: No prevertebral fluid or swelling. No visible canal hematoma. Disc levels: Degenerative disease with disc height loss at C3-4, C4-5, C5-6, C6-7, C7-T1 and T1-2 with mild bilateral facet arthropathy. Bilateral uncovertebral degenerative changes at C3-4 and C4-5 with moderate-severe foraminal stenosis. Bilateral uncovertebral degenerative changes at C5-6 with mild foraminal stenosis. Upper chest: Lung apices are clear. Other: No fluid collection or hematoma. IMPRESSION: 1. Acute subdural hemorrhage along the left parietal convexity measuring 7 mm in thickness. 1-2 mm left right midline shift. 2.  No acute osseous injury of the cervical spine. Critical Value/emergent results were called by telephone at the time of interpretation on 08/23/2021 at 2:17 pm to provider [facility] , who verbally acknowledged these results. Electronically Signed   By: Kathreen Devoid M.D.   On: 08/23/2021 14:19   CT Cervical Spine Wo Contrast  Result Date: 08/23/2021 CLINICAL DATA:  Neck pain, left hip pain status post fall this morning. EXAM: CT HEAD WITHOUT CONTRAST CT CERVICAL SPINE WITHOUT CONTRAST TECHNIQUE: Multidetector CT imaging of the head and cervical spine was performed following the standard protocol without intravenous contrast. Multiplanar CT image reconstructions of the cervical spine were also generated. RADIATION DOSE REDUCTION: This exam was performed according to the departmental dose-optimization program which includes automated exposure control, adjustment of the mA and/or kV according to patient size and/or use of iterative reconstruction technique. COMPARISON:  None. FINDINGS: Brain: Acute hemorrhage along the left cerebral convexity along the parietal  lobe measuring 7 mm in thickness. 1-2 mm left-to-right midline shift. No evidence of acute infarction, ventriculomegaly, or mass effect. 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 generalized cerebral atrophy. Periventricular white matter low attenuation likely secondary to microangiopathy. Vascular: Cerebrovascular atherosclerotic calcifications are noted. No hyperdense vessels. Skull: Negative for fracture or focal lesion. Sinuses/Orbits: Visualized portions of the orbits are unremarkable. Visualized portions of the paranasal sinuses are unremarkable. Visualized portions of the mastoid air cells are unremarkable. Other: None. CT CERVICAL SPINE FINDINGS Alignment: Normal. Skull base and vertebrae: No acute fracture. No primary bone lesion or focal pathologic process. Soft tissues and spinal canal: No prevertebral fluid or swelling. No visible canal hematoma. Disc levels: Degenerative disease with disc height loss at C3-4, C4-5, C5-6, C6-7, C7-T1 and T1-2 with mild bilateral facet arthropathy. Bilateral uncovertebral degenerative changes at C3-4 and C4-5 with moderate-severe foraminal stenosis. Bilateral uncovertebral degenerative changes at C5-6 with mild foraminal stenosis. Upper chest: Lung apices are clear. Other: No fluid collection or hematoma. IMPRESSION: 1. Acute subdural hemorrhage along the left parietal convexity measuring 7 mm in thickness. 1-2 mm left right midline shift. 2.  No acute osseous injury of the cervical spine. Critical Value/emergent results were called by telephone at the time of interpretation on 08/23/2021 at 2:17 pm to provider [facility] , who verbally acknowledged these results. Electronically Signed   By: Kathreen Devoid M.D.   On: 08/23/2021 14:19   MR BRAIN WO CONTRAST  Result Date: 09/12/2021 CLINICAL DATA:  Initial evaluation for neuro deficit, stroke. EXAM: MRI HEAD WITHOUT CONTRAST TECHNIQUE: Multiplanar, multiecho pulse sequences of the brain and surrounding  structures were obtained without intravenous contrast. COMPARISON:  Prior CTs from earlier the same day. FINDINGS: Brain: Examination moderately to severely degraded by motion artifact. Diffuse prominence of the CSF containing spaces compatible generalized cerebral atrophy.  Mild chronic microvascular ischemic disease noted involving the periventricular white matter. Few scattered remote lacunar infarcts noted about the bilateral basal ganglia/corona radiata. Remote lacunar infarct present at the right pons. Encephalomalacia and gliosis involving the right occipital lobe compatible with a chronic right PCA distribution infarct. Few additional scattered bilateral cerebellar infarcts noted, right greater than left. Patchy diffusion signal abnormality involving the right corona radiata measures approximately 2 cm in size, consistent with an acute to early subacute ischemic infarct (series 3, image 33). No associated hemorrhage or mass effect. No other evidence for acute or subacute ischemia. Gray-white matter differentiation otherwise maintained. No mass lesion. Ventricles normal size without hydrocephalus. Known subacute left subdural hematoma again seen, grossly stable measuring up to approximately 5-6 mm without significant mass effect or midline shift. Basilar cisterns remain patent. Vascular: Major intracranial vascular flow voids are maintained. Skull and upper cervical spine: Craniocervical junction grossly within normal limits on this motion degraded exam. Bone marrow signal intensity grossly normal. No scalp soft tissue abnormality. Sinuses/Orbits: Prior ocular lens replacement on the left. Right gaze noted. Chronic mucosal thickening noted throughout the ethmoidal air cells and maxillary sinuses. No significant mastoid effusion. Other: None. IMPRESSION: 1. Motion degraded exam. 2. 2 cm acute to early subacute ischemic nonhemorrhagic infarct involving the right corona radiata. 3. Chronic right PCA distribution  infarct, with additional scattered remote lacunar infarcts involving the bilateral basal ganglia/corona radiata, right pons, and bilateral cerebellar hemispheres. 4. Stable subacute left subdural hematoma measuring up to 5-6 mm without significant mass effect or midline shift. Electronically Signed   By: Jeannine Boga M.D.   On: 09/12/2021 20:52   CT Hip Left Wo Contrast  Result Date: 08/23/2021 CLINICAL DATA:  Hip trauma.  Fracture suspected. EXAM: CT OF THE LEFT HIP WITHOUT CONTRAST TECHNIQUE: Multidetector CT imaging of the left hip was performed according to the standard protocol. Multiplanar CT image reconstructions were also generated. RADIATION DOSE REDUCTION: This exam was performed according to the departmental dose-optimization program which includes automated exposure control, adjustment of the mA and/or kV according to patient size and/or use of iterative reconstruction technique. COMPARISON:  Pelvis and left hip radiographs 08/23/2021 FINDINGS: Bones/Joint/Cartilage Severe left femoroacetabular osteoarthritis, greatest within the posterosuperior aspect. Moderate subchondral sclerosis and subchondral degenerative cystic change. Moderate to high-grade left femoral head-neck junction and moderate peripheral left acetabular degenerative osteophytosis. Bridging degenerative osteophytes within the visualized portion of the inferior left sacroiliac joint. More mild endplate osteophytes of the right sacroiliac joint. Streak artifact from total right hip arthroplasty hardware. No perihardware lucency is seen to indicate hardware failure or loosening. No acute fracture is seen. Ligaments Suboptimally assessed by CT. Muscles and Tendons Grossly unremarkable. Soft tissues No left hip joint effusion. Moderate vascular calcifications are noted. Mild-to-moderate fat containing left inguinal hernia. IMPRESSION:: IMPRESSION: 1. No left hip fracture is seen. 2. Severe left femoroacetabular osteoarthritis. 3.  Status post total right hip arthroplasty without evidence of hardware failure. Electronically Signed   By: Yvonne Kendall M.D.   On: 08/23/2021 16:01   DG CHEST PORT 1 VIEW  Result Date: 09/12/2021 CLINICAL DATA:  Altered mental status. EXAM: PORTABLE CHEST 1 VIEW COMPARISON:  August 23, 2021. FINDINGS: The heart size and mediastinal contours are within normal limits. Both lungs are clear. The visualized skeletal structures are unremarkable. IMPRESSION: No active disease. Electronically Signed   By: Marijo Conception M.D.   On: 09/12/2021 15:04   DG Chest Port 1 View  Result Date: 08/23/2021 CLINICAL DATA:  Neck and left hip pain, hematuria, fell EXAM: PORTABLE CHEST 1 VIEW COMPARISON:  03/19/2021 FINDINGS: Single frontal view of the chest demonstrates a stable cardiac silhouette. Stable ectasia and atherosclerosis of the thoracic aorta. No acute airspace disease, effusion, or pneumothorax. Chronic scarring at the left lung base. No acute bony abnormalities. IMPRESSION: 1. No acute intrathoracic process. Electronically Signed   By: Randa Ngo M.D.   On: 08/23/2021 14:59   EEG adult  Result Date: 08/26/2021 Clabe Seal, MD     08/26/2021  8:25 AM EEG - 08/25/2021 Indication - AMS Technical: This is a digitally recorded electroencephalogram. The international 10-20 electrode placement system is used for scalp electrode placement. Eighteen channels of scalp EEG are recorded The data are stored digitally and reviewed in reformatted montages for optimal display. The patient is drowsy during the 43 minute EEG. Background of 5-6 Hz activity. No focal slowing was seen. No seizure like activity was observed during this recording. Impression: This EEG is abnormal. Diffuse slowing is seen, suggestive of a diffuse abnormality of the brain. This finding is nonspecific, and can be seen in a diffuse or multifocal abnormality of the brain. Clinical correlation is needed. The absence of interictal epileptiform  abnormalities does not exclude the diagnosis of a seizure disorder   Overnight EEG with video  Result Date: 08/29/2021 Lora Havens, MD     08/29/2021 11:39 AM Patient Name: Maddax Palinkas MRN: [id number] Epilepsy Attending: Lora Havens Referring Physician/Provider: Donnetta Simpers, MD Duration: 08/28/2021 1835 to 08/29/2021 1030 Patient history: 88 year old male with left subdural hematoma noted to have an episode of confusion.  EEG to evaluate for seizures. Level of alertness: Awake, asleep AEDs during EEG study: Keppra Technical aspects: This EEG study was done with scalp electrodes positioned according to the 10-20 International system of electrode placement. Electrical activity was acquired at a sampling rate of 500Hz  and reviewed with a high frequency filter of 70Hz  and a low frequency filter of 1Hz . EEG data were recorded continuously and digitally stored. Description: The posterior dominant rhythm consists of 8 Hz activity of moderate voltage (25-35 uV) seen predominantly in posterior head regions, symmetric and reactive to eye opening and eye closing.  Sleep was characterized by vertex waves, sleep spindles (12 to 14 Hz), maximal frontocentral region.  EEG showed continuous generalized 5 to 6 Hz theta slowing. Hyperventilation and photic stimulation were not performed.   ABNORMALITY - Continuous slow, generalized IMPRESSION: This study is suggestive of mild diffuse encephalopathy, nonspecific etiology. No seizures or epileptiform discharges were seen throughout the recording. Lora Havens   ECHOCARDIOGRAM COMPLETE  Result Date: 09/13/2021    ECHOCARDIOGRAM REPORT   Patient Name:   FUTURE YELDELL Date of Exam: 09/13/2021 Medical Rec #:  [id number]          Height:       70.5 in Accession #:    [id number]         Weight:       156.4 lb Date of Birth:  1928/09/12          BSA:          1.890 m Patient Age:    11 years           BP:           133/74 mmHg Patient Gender: M                   HR:  74 bpm. Exam Location:  Inpatient Procedure: 2D Echo, Cardiac Doppler, Color Doppler and Intracardiac            Opacification Agent Indications:    CVA  History:        Patient has prior history of Echocardiogram examinations, most                 recent 05/07/2020. Stroke; Risk Factors:Dyslipidemia and                 Hypertension.  Sonographer:    Luisa Hart RDCS Referring Phys: [id number] Cornelius Moras XU IMPRESSIONS  1. Left ventricular ejection fraction, by estimation, is 55 to 60%. The left ventricle has normal function. The left ventricle has no regional wall motion abnormalities. There is mild concentric left ventricular hypertrophy. Left ventricular diastolic parameters are consistent with Grade I diastolic dysfunction (impaired relaxation).  2. Right ventricular systolic function is normal. The right ventricular size is normal. There is normal pulmonary artery systolic pressure.  3. Left atrial size was mildly dilated.  4. The mitral valve is grossly normal. No evidence of mitral valve regurgitation. Moderate mitral annular calcification.  5. The aortic valve was not well visualized. Aortic valve regurgitation is not visualized. Aortic valve sclerosis is present, with no evidence of aortic valve stenosis.  6. The inferior vena cava is dilated in size with >50% respiratory variability, suggesting right atrial pressure of 8 mmHg. FINDINGS  Left Ventricle: Left ventricular ejection fraction, by estimation, is 55 to 60%. The left ventricle has normal function. The left ventricle has no regional wall motion abnormalities. The left ventricular internal cavity size was normal in size. There is  mild concentric left ventricular hypertrophy. Left ventricular diastolic parameters are consistent with Grade I diastolic dysfunction (impaired relaxation). Indeterminate filling pressures. Right Ventricle: The right ventricular size is normal. No increase in right ventricular wall thickness. Right ventricular  systolic function is normal. There is normal pulmonary artery systolic pressure. The tricuspid regurgitant velocity is 1.98 m/s, and  with an assumed right atrial pressure of 8 mmHg, the estimated right ventricular systolic pressure is 36.4 mmHg. Left Atrium: Left atrial size was mildly dilated. Right Atrium: Right atrial size was normal in size. Pericardium: There is no evidence of pericardial effusion. Mitral Valve: The mitral valve is grossly normal. Moderate mitral annular calcification. No evidence of mitral valve regurgitation. MV peak gradient, 3.6 mmHg. The mean mitral valve gradient is 2.0 mmHg. Tricuspid Valve: The tricuspid valve is normal in structure. Tricuspid valve regurgitation is trivial. Aortic Valve: The aortic valve was not well visualized. Aortic valve regurgitation is not visualized. Aortic valve sclerosis is present, with no evidence of aortic valve stenosis. Aortic valve mean gradient measures 3.0 mmHg. Aortic valve peak gradient measures 6.1 mmHg. Aortic valve area, by VTI measures 3.02 cm. Pulmonic Valve: The pulmonic valve was not well visualized. Pulmonic valve regurgitation is not visualized. Aorta: The aortic root and ascending aorta are structurally normal, with no evidence of dilitation. Venous: IVC assessment for right atrial pressure unable to be performed due to mechanical ventilation. The inferior vena cava is dilated in size with greater than 50% respiratory variability, suggesting right atrial pressure of 8 mmHg. IAS/Shunts: No atrial level shunt detected by color flow Doppler.  LEFT VENTRICLE PLAX 2D LVIDd:         4.50 cm     Diastology LVIDs:         3.10 cm     LV e' medial:  4.56 cm/s LV PW:         1.20 cm     LV E/e' medial:  17.1 LV IVS:        1.10 cm     LV e' lateral:   7.95 cm/s LVOT diam:     2.40 cm     LV E/e' lateral: 9.8 LV SV:         64 LV SV Index:   34 LVOT Area:     4.52 cm  LV Volumes (MOD) LV vol d, MOD A2C: 48.1 ml LV vol d, MOD A4C: 85.8 ml LV vol  s, MOD A2C: 24.4 ml LV vol s, MOD A4C: 48.2 ml LV SV MOD A2C:     23.7 ml LV SV MOD A4C:     85.8 ml LV SV MOD BP:      29.4 ml RIGHT VENTRICLE RV Basal diam:  2.80 cm RV Mid diam:    2.50 cm RV S prime:     11.80 cm/s LEFT ATRIUM           Index        RIGHT ATRIUM           Index LA diam:      3.70 cm 1.96 cm/m   RA Area:     12.30 cm LA Vol (A4C): 48.8 ml 25.82 ml/m  RA Volume:   24.10 ml  12.75 ml/m  AORTIC VALVE                    PULMONIC VALVE AV Area (Vmax):    3.29 cm     PV Vmax:       0.82 m/s AV Area (Vmean):   3.05 cm     PV Vmean:      56.100 cm/s AV Area (VTI):     3.02 cm     PV VTI:        0.135 m AV Vmax:           123.00 cm/s  PV Peak grad:  2.7 mmHg AV Vmean:          77.900 cm/s  PV Mean grad:  1.0 mmHg AV VTI:            0.213 m AV Peak Grad:      6.1 mmHg AV Mean Grad:      3.0 mmHg LVOT Vmax:         89.40 cm/s LVOT Vmean:        52.500 cm/s LVOT VTI:          0.142 m LVOT/AV VTI ratio: 0.67  AORTA Ao Root diam: 3.30 cm Ao Asc diam:  3.40 cm MITRAL VALVE               TRICUSPID VALVE MV Area (PHT): 2.37 cm    TR Peak grad:   15.7 mmHg MV Area VTI:   2.02 cm    TR Vmax:        198.00 cm/s MV Peak grad:  3.6 mmHg MV Mean grad:  2.0 mmHg    SHUNTS MV Vmax:       0.96 m/s    Systemic VTI:  0.14 m MV Vmean:      63.1 cm/s   Systemic Diam: 2.40 cm MV Decel Time: 320 msec MV E velocity: 77.90 cm/s MV A velocity: 87.90 cm/s MV E/A ratio:  0.89 Mihai Croitoru MD Electronically signed by Sanda Klein MD Signature Date/Time: 09/13/2021/10:07:24  AM    Final    DG Hip Unilat W or Wo Pelvis 2-3 Views Left  Result Date: 08/23/2021 CLINICAL DATA:  LEFT hip pain after falling. EXAM: DG HIP (WITH OR WITHOUT PELVIS) 2-3V LEFT COMPARISON:  03/07/2016 FINDINGS: Status post RIGHT total hip arthroplasty. 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 There is marked degenerative change of the LEFT hip, with near complete obliteration of the joint space and large osteophytes, similar to prior study. There  is no acute fracture or subluxation. Degenerative changes are seen in the lumbar spine. IMPRESSION: No evidence for acute abnormality. Significant degenerative changes of the LEFT hip. Electronically Signed   By: Nolon Nations M.D.   On: 08/23/2021 14:25   CT HEAD CODE STROKE WO CONTRAST  Result Date: 09/12/2021 CLINICAL DATA:  Code stroke. EXAM: CT HEAD WITHOUT CONTRAST TECHNIQUE: Contiguous axial images were obtained from the base of the skull through the vertex without intravenous contrast. RADIATION DOSE REDUCTION: This exam was performed according to the departmental dose-optimization program which includes automated exposure control, adjustment of the mA and/or kV according to patient size and/or use of iterative reconstruction technique. COMPARISON:  08/25/2021 FINDINGS: Brain: Hypodense to isodense left subdural hematoma is again identified and is likely not substantially changed in size. There are decreased dependent hyperdense blood products. No new hemorrhage. There is increased ill-defined hypodensity in the region of the right corona radiata and basal ganglia. Chronic right occipitoparietal infarct. Chronic right cerebellar infarct. Chronic infarcts of the left corona radiata and basal ganglia. Vascular: No hyperdense vessel. Skull: Unremarkable. Sinuses/Orbits: Mild mucosal thickening. Evidence of chronic sinus inflammation. Left lens replacement. Other: Mastoid air cells are clear. ASPECTS [facility] Stroke Program Early CT Score) - Ganglionic level infarction (caudate, lentiform nuclei, internal capsule, insula, M1-M3 cortex): 5 - Supraganglionic infarction (M4-M6 cortex): 3 Total score (0-10 with 10 being normal): 8 IMPRESSION: There is no acute intracranial hemorrhage. Increased ill-defined low-density in the region of the right corona radiata and basal ganglia may reflect recent small vessel infarcts. ASPECT score is 8. Mixed density left subdural hematoma likely similar in size. Decreased  hyperdense hemorrhage. Electronically Signed   By: Macy Mis M.D.   On: 09/12/2021 12:22   CT HEAD CODE STROKE WO CONTRAST  Result Date: 08/25/2021 CLINICAL DATA:  Code stroke.  Nonresponsive, fixed gaze EXAM: CT HEAD WITHOUT CONTRAST TECHNIQUE: Contiguous axial images were obtained from the base of the skull through the vertex without intravenous contrast. RADIATION DOSE REDUCTION: This exam was performed according to the departmental dose-optimization program which includes automated exposure control, adjustment of the mA and/or kV according to patient size and/or use of iterative reconstruction technique. COMPARISON:  08/24/2021. FINDINGS: Evaluation is limited by motion artifact, despite repeat imaging. Brain: Left subdural hematoma is again noted, which appears unchanged, although evaluation is limited by motion. No new extra-axial collection. Redemonstrated remote infarcts in the right occipital lobe and right cerebellum. No definite new cortical infarct. No acute parenchymal hemorrhage, mass, mass effect, or significant midline shift. Unchanged size and configuration of the ventricles. Vascular: No definite hyperdense vessel. Skull: No definite acute fracture or focal lesion. Sinuses/Orbits: Mucosal thickening throughout the paranasal sinuses. The orbits are grossly unremarkable. Other: None. ASPECTS [facility] Stroke Program Early CT Score) - Ganglionic level infarction (caudate, lentiform nuclei, internal capsule, insula, M1-M3 cortex): 7 - Supraganglionic infarction (M4-M6 cortex): 3 Total score (0-10 with 10 being normal): 10 IMPRESSION: 1. Evaluation is significantly limited by motion artifact, despite repeat imaging. Within this limitation,  no acute intracranial process. Grossly unchanged size of a left subdural hematoma. 2. ASPECTS is 10 Code stroke imaging results were communicated on 08/25/2021 at 5:03 pm to provider Dr. Lorrin Goodell via secure text paging. Electronically Signed   By: Merilyn Baba M.D.   On: 08/25/2021 17:04   CT ANGIO HEAD NECK W WO CM (CODE STROKE)  Addendum Date: 09/12/2021   ADDENDUM REPORT: 09/12/2021 13:12 ADDENDUM: Findings discussed with Dr. Reeves Forth via telephone at 1:10 PM Electronically Signed   By: Margaretha Sheffield M.D.   On: 09/12/2021 13:12   Result Date: 09/12/2021 CLINICAL DATA:  Code stroke. EXAM: CT ANGIOGRAPHY HEAD AND NECK TECHNIQUE: Multidetector CT imaging of the head and neck was performed using the standard protocol during bolus administration of intravenous contrast. Multiplanar CT image reconstructions and MIPs were obtained to evaluate the vascular anatomy. Carotid stenosis measurements (when applicable) are obtained utilizing NASCET criteria, using the distal internal carotid diameter as the denominator. RADIATION DOSE REDUCTION: This exam was performed according to the departmental dose-optimization program which includes automated exposure control, adjustment of the mA and/or kV according to patient size and/or use of iterative reconstruction technique. CONTRAST:  118mL OMNIPAQUE IOHEXOL 350 MG/ML SOLN COMPARISON:  None. FINDINGS: CTA NECK FINDINGS Aortic arch: Extensive and ulcerated atherosclerosis of the visualized aorta. Right subclavian artery origin is not imaged. Right carotid system: Atherosclerosis at the carotid bifurcation without greater than 50% stenosis. Left carotid system: Approximately 60% stenosis of the left carotid artery origin. Atherosclerosis at the carotid bifurcation without greater than% stenosis. Vertebral arteries: Patent. Moderate stenosis of the left intradural vertebral artery at C4-C5 due to mass effect from adjacent osteophytes. Skeleton: Other neck: Upper chest: Review of the MIP images confirms the above findings CTA HEAD FINDINGS Anterior circulation: Bilateral intracranial ICAs are patent with moderate right and mild left paraclinoid ICA stenosis. Bilateral MCAs are patent with moderate bilateral M1 MCA stenosis.  Multifocal severe bilateral M2 MCA stenosis. Bilateral ACAs are patent. Multifocal severe stenosis of the right A2 ACA. Posterior circulation: Severe stenosis of the left intradural vertebral artery. Severe stenosis of the distal basilar artery. Severe stenosis of the left P1 PCA. Severe stenosis versus short-segment occlusion of the left P2 PCA proximally with opacified small distal PCA. Severe stenosis of the distal right P2 PCA with opacified small distal PCA. Venous sinuses: Nondiagnostic evaluation due to arterial timing Review of the MIP images confirms the above findings Dr. Reeves Forth paged at the time of dication for call report. Awaiting call back. IMPRESSION: CTA head: 1. Severe stenosis versus short-segment occlusion of the left P2 PCA. 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 Distal PCAs are opacified bilaterally, but small and irregular. 2. Severe stenosis of the right A2 ACA. 3. Multifocal severe bilateral M2 MCA stenosis. 4. Moderate right paraclinoid ICA and bilateral M1 MCA stenosis. CTA neck: 1. Approximately 60% stenosis of the left carotid artery origin. 2. Moderate stenosis of the left intradural vertebral artery at C4-C5 due to mass effect from adjacent osteophytes. 3. Moderate stenosis of the left subclavian artery origin. Right subclavian artery origin is not imaged. 4. Ulcerated aortic atherosclerosis (ICD10-I70.0). Electronically Signed: By: Margaretha Sheffield M.D. On: 09/12/2021 13:05    Microbiology: Results for orders placed or performed during the hospital encounter of 09/12/21  Urine Culture     Status: Abnormal   Collection Time: 09/12/21 11:48 AM   Specimen: Urine, Clean Catch  Result Value Ref Range Status  Specimen Description URINE, CLEAN CATCH  Final   Special Requests   Final    NONE Performed at [facility], [place] [address] [place], [place] [address]    Culture >=100,000 COLONIES/mL PROTEUS  MIRABILIS (A)  Final   Report Status 09/14/2021 FINAL  Final   Organism ID, Bacteria PROTEUS MIRABILIS (A)  Final      Susceptibility   Proteus mirabilis - MIC*    AMPICILLIN <=2 SENSITIVE Sensitive     CEFAZOLIN <=4 SENSITIVE Sensitive     CEFEPIME <=0.12 SENSITIVE Sensitive     CEFTRIAXONE <=0.25 SENSITIVE Sensitive     CIPROFLOXACIN <=0.25 SENSITIVE Sensitive     GENTAMICIN 2 SENSITIVE Sensitive     IMIPENEM 2 SENSITIVE Sensitive     NITROFURANTOIN 128 RESISTANT Resistant     TRIMETH/SULFA <=20 SENSITIVE Sensitive     AMPICILLIN/SULBACTAM <=2 SENSITIVE Sensitive     PIP/TAZO <=4 SENSITIVE Sensitive     * >=100,000 COLONIES/mL PROTEUS MIRABILIS  Resp Panel by RT-PCR (Flu A&B, Covid) Nasopharyngeal Swab     Status: None   Collection Time: 09/12/21 11:59 AM   Specimen: Nasopharyngeal Swab; Nasopharyngeal(NP) swabs in vial transport medium  Result Value Ref Range Status   SARS Coronavirus 2 by RT PCR NEGATIVE NEGATIVE Final    Comment: (NOTE) SARS-CoV-2 target nucleic acids are NOT DETECTED.  The SARS-CoV-2 RNA is generally detectable in upper respiratory specimens during the acute phase of infection. The lowest concentration of SARS-CoV-2 viral copies this assay can detect is 138 copies/mL. A negative result does not preclude SARS-Cov-2 infection and should not be used as the sole basis for treatment or other patient management decisions. A negative result may occur with  improper specimen collection/handling, submission of specimen other than nasopharyngeal swab, presence of viral mutation(s) within the areas targeted by this assay, and inadequate number of viral copies(<138 copies/mL). A negative result must be combined with clinical observations, patient history, and epidemiological information. The expected result is Negative.  Fact Sheet for Patients:  EntrepreneurPulse.com.au  Fact Sheet for Healthcare Providers:   IncredibleEmployment.be  This test is no t yet approved or cleared by the [place] FDA and  has been authorized for detection and/or diagnosis of SARS-CoV-2 by FDA under an Emergency Use Authorization (EUA). This EUA will remain  in effect (meaning this test can be used) for the duration of the COVID-19 declaration under Section 564(b)(1) of the Act, 21 U.S.C.section 360bbb-3(b)(1), unless the authorization is terminated  or revoked sooner.       Influenza A by PCR NEGATIVE NEGATIVE Final   Influenza B by PCR NEGATIVE NEGATIVE Final    Comment: (NOTE) The Xpert Xpress SARS-CoV-2/FLU/RSV plus assay is intended as an aid in the diagnosis of influenza from Nasopharyngeal swab specimens and should not be used as a sole basis for treatment. Nasal washings and aspirates are unacceptable for Xpert Xpress SARS-CoV-2/FLU/RSV testing.  Fact Sheet for Patients: EntrepreneurPulse.com.au  Fact Sheet for Healthcare Providers: IncredibleEmployment.be  This test is not yet approved or cleared by the [place] FDA and has been authorized for detection and/or diagnosis of SARS-CoV-2 by FDA under an Emergency Use Authorization (EUA). This EUA will remain in effect (meaning this test can be used) for the duration of the COVID-19 declaration under Section 564(b)(1) of the Act, 21 U.S.C. section 360bbb-3(b)(1), unless the authorization is terminated or revoked.  Performed at [facility], [place] [address] [place], [place] [address]   Culture, blood (routine x 2)  Status: None (Preliminary result)   Collection Time: 09/12/21  7:59 PM   Specimen: BLOOD  Result Value Ref Range Status   Specimen Description BLOOD SITE NOT SPECIFIED  Final   Special Requests   Final    BOTTLES DRAWN AEROBIC AND ANAEROBIC Blood Culture adequate volume   Culture   Final    NO GROWTH 2 DAYS Performed at [facility], [address]  [place], [place] [address]    Report Status PENDING  Incomplete  Culture, blood (routine x 2)     Status: None (Preliminary result)   Collection Time: 09/12/21  8:00 PM   Specimen: BLOOD  Result Value Ref Range Status   Specimen Description BLOOD SITE NOT SPECIFIED  Final   Special Requests   Final    BOTTLES DRAWN AEROBIC AND ANAEROBIC Blood Culture adequate volume   Culture   Final    NO GROWTH 2 DAYS Performed at [facility], [address], [place], [place] [address]    Report Status PENDING  Incomplete    Labs: CBC: Recent Labs  Lab 09/12/21 1157 09/12/21 1202 09/13/21 0301 09/14/21 0058  WBC  --  14.6* 15.3* 14.6*  NEUTROABS  --  11.8*  --  11.7*  HGB 16.7 16.0 14.9 13.0  HCT 49.0 48.0 44.4 38.7*  MCV  --  93.8 92.1 92.6  PLT  --  132* 114* 626*   Basic Metabolic Panel: Recent Labs  Lab 09/12/21 1157 09/12/21 1202 09/13/21 0301 09/14/21 0058  NA 136 132* 135 135  K 3.8 3.7 3.5 3.4*  CL 99 94* 100 102  CO2  --  26 27 24   GLUCOSE 128* 130* 89 105*  BUN 35* 35* 37* 33*  CREATININE 2.00* 2.06* 1.85* 1.85*  CALCIUM  --  8.9 8.5* 8.0*   Liver Function Tests: Recent Labs  Lab 09/12/21 1202  AST 30  ALT 27  ALKPHOS 71  BILITOT 1.0  PROT 6.4*  ALBUMIN 3.0*   CBG: Recent Labs  Lab 09/13/21 1126 09/13/21 1532 09/13/21 2116 09/14/21 0637 09/14/21 1211  GLUCAP 91 92 96 86 71    Discharge time spent: greater than 30 minutes.  Signed: Hosie Poisson, MD [facility] 09/15/2021

## 2021-09-15 NOTE — TOC Transition Note (Signed)
Transition of Care [facility]) - CM/SW Discharge Note   Patient Details  Name: Brendan Long MRN: [id number] Date of Birth: Jan 23, 1929  [facility] CM/SW Contact:  Pollie Friar, RN Phone Number: 09/15/2021, 10:25 AM   Clinical Narrative:    Patient is discharging to [facility] today. Ambulance to provide transport. Will update the bedside RN and discharge packet is at the desk.  Number for report:  [phone]   Final next level of care: [place] Barriers to Discharge: No Barriers Identified   Patient Goals and CMS Choice     Choice offered to / list presented to : Adult Children  Discharge Placement                       Discharge Plan and Services                                     Social Determinants of Health (SDOH) Interventions     Readmission Risk Interventions Readmission Risk Prevention Plan 03/20/2019  Post Dischage Appt Complete  Medication Screening Complete  Transportation Screening Complete  Some recent data might be hidden

## 2021-09-15 NOTE — Care Management Important Message (Signed)
Important Message  Patient Details  Name: Brendan Long MRN: [id number] Date of Birth: 1928-08-23   Medicare Important Message Given:  Yes   Patient left prior to IM delivery will mail to the patient home address.   Keven Osborn 09/15/2021, 3:07 PM

## 2021-09-15 NOTE — Progress Notes (Signed)
[facility] ([facility]) Hospital Liaison note.     This patient is approved to transfer to [facility] today.    [facility] will notify TOC when registration paperwork has been completed to arrange transport.    RN please call report to [phone].   Thank you,     Mary Anne Robertson, RN, CCM       [facility] Hospital Liaison  [phone] 

## 2021-09-17 LAB — CULTURE, BLOOD (ROUTINE X 2)
Culture: NO GROWTH
Culture: NO GROWTH
Special Requests: ADEQUATE
Special Requests: ADEQUATE
